# Patient Record
Sex: Female | Born: 1945
Health system: Southern US, Community
[De-identification: ages and names within clinical notes are randomized; demographics above are authoritative.]

## PROBLEM LIST (undated history)

## (undated) ENCOUNTER — Ambulatory Visit (HOSPITAL_COMMUNITY): Admission: EM | Payer: Medicare Other

## (undated) DIAGNOSIS — I639 Cerebral infarction, unspecified: Secondary | ICD-10-CM

## (undated) DIAGNOSIS — R109 Unspecified abdominal pain: Secondary | ICD-10-CM

## (undated) DIAGNOSIS — K219 Gastro-esophageal reflux disease without esophagitis: Secondary | ICD-10-CM

## (undated) DIAGNOSIS — Z8673 Personal history of transient ischemic attack (TIA), and cerebral infarction without residual deficits: Secondary | ICD-10-CM

## (undated) DIAGNOSIS — I10 Essential (primary) hypertension: Secondary | ICD-10-CM

## (undated) DIAGNOSIS — K449 Diaphragmatic hernia without obstruction or gangrene: Secondary | ICD-10-CM

## (undated) DIAGNOSIS — I214 Non-ST elevation (NSTEMI) myocardial infarction: Secondary | ICD-10-CM

## (undated) DIAGNOSIS — F411 Generalized anxiety disorder: Secondary | ICD-10-CM

## (undated) DIAGNOSIS — B370 Candidal stomatitis: Secondary | ICD-10-CM

## (undated) HISTORY — PX: KNEE ARTHROSCOPY: SUR90

## (undated) HISTORY — PX: BREAST SURGERY: SHX581

## (undated) HISTORY — DX: Unspecified abdominal pain: R10.9

## (undated) HISTORY — DX: Generalized anxiety disorder: F41.1

## (undated) HISTORY — DX: Candidal stomatitis: B37.0

## (undated) HISTORY — DX: Essential (primary) hypertension: I10

## (undated) HISTORY — DX: Gastro-esophageal reflux disease without esophagitis: K21.9

## (undated) HISTORY — PX: ABDOMINAL HYSTERECTOMY: SHX81

---

## 2000-08-03 ENCOUNTER — Encounter: Payer: Self-pay | Admitting: Internal Medicine

## 2000-08-03 ENCOUNTER — Encounter: Admission: RE | Admit: 2000-08-03 | Discharge: 2000-08-03 | Payer: Self-pay | Admitting: Internal Medicine

## 2000-08-05 ENCOUNTER — Encounter: Admission: RE | Admit: 2000-08-05 | Discharge: 2000-08-05 | Payer: Self-pay | Admitting: Internal Medicine

## 2000-08-05 ENCOUNTER — Encounter: Payer: Self-pay | Admitting: Internal Medicine

## 2000-08-29 ENCOUNTER — Emergency Department (HOSPITAL_COMMUNITY): Admission: EM | Admit: 2000-08-29 | Discharge: 2000-08-29 | Payer: Self-pay | Admitting: Emergency Medicine

## 2001-01-28 ENCOUNTER — Encounter: Payer: Self-pay | Admitting: Internal Medicine

## 2001-01-28 ENCOUNTER — Encounter: Admission: RE | Admit: 2001-01-28 | Discharge: 2001-01-28 | Payer: Self-pay | Admitting: Internal Medicine

## 2001-08-09 ENCOUNTER — Encounter: Payer: Self-pay | Admitting: Internal Medicine

## 2001-08-09 ENCOUNTER — Encounter: Admission: RE | Admit: 2001-08-09 | Discharge: 2001-08-09 | Payer: Self-pay | Admitting: Internal Medicine

## 2002-08-22 ENCOUNTER — Encounter: Payer: Self-pay | Admitting: Internal Medicine

## 2002-08-22 ENCOUNTER — Encounter: Admission: RE | Admit: 2002-08-22 | Discharge: 2002-08-22 | Payer: Self-pay | Admitting: Internal Medicine

## 2002-12-04 ENCOUNTER — Emergency Department (HOSPITAL_COMMUNITY): Admission: EM | Admit: 2002-12-04 | Discharge: 2002-12-04 | Payer: Self-pay | Admitting: Emergency Medicine

## 2002-12-04 ENCOUNTER — Encounter: Payer: Self-pay | Admitting: Emergency Medicine

## 2003-08-27 ENCOUNTER — Encounter: Payer: Self-pay | Admitting: General Surgery

## 2003-08-27 ENCOUNTER — Encounter: Admission: RE | Admit: 2003-08-27 | Discharge: 2003-08-27 | Payer: Self-pay | Admitting: General Surgery

## 2004-06-08 ENCOUNTER — Encounter: Admission: RE | Admit: 2004-06-08 | Discharge: 2004-06-08 | Payer: Self-pay | Admitting: Internal Medicine

## 2004-09-12 ENCOUNTER — Encounter: Admission: RE | Admit: 2004-09-12 | Discharge: 2004-09-12 | Payer: Self-pay | Admitting: General Surgery

## 2004-10-03 ENCOUNTER — Encounter: Admission: RE | Admit: 2004-10-03 | Discharge: 2004-10-03 | Payer: Self-pay | Admitting: General Surgery

## 2004-12-09 ENCOUNTER — Ambulatory Visit: Payer: Self-pay | Admitting: Internal Medicine

## 2005-02-04 ENCOUNTER — Ambulatory Visit: Payer: Self-pay | Admitting: Internal Medicine

## 2005-02-13 ENCOUNTER — Ambulatory Visit: Payer: Self-pay

## 2005-04-02 ENCOUNTER — Encounter: Admission: RE | Admit: 2005-04-02 | Discharge: 2005-04-02 | Payer: Self-pay | Admitting: General Surgery

## 2005-04-07 ENCOUNTER — Ambulatory Visit: Payer: Self-pay | Admitting: Internal Medicine

## 2005-05-12 ENCOUNTER — Ambulatory Visit: Payer: Self-pay | Admitting: Internal Medicine

## 2005-05-28 ENCOUNTER — Ambulatory Visit: Payer: Self-pay | Admitting: Internal Medicine

## 2005-07-01 ENCOUNTER — Encounter: Admission: RE | Admit: 2005-07-01 | Discharge: 2005-07-01 | Payer: Self-pay | Admitting: Internal Medicine

## 2005-07-07 ENCOUNTER — Ambulatory Visit: Payer: Self-pay | Admitting: Internal Medicine

## 2005-07-10 ENCOUNTER — Emergency Department (HOSPITAL_COMMUNITY): Admission: EM | Admit: 2005-07-10 | Discharge: 2005-07-10 | Payer: Self-pay | Admitting: Emergency Medicine

## 2005-07-16 ENCOUNTER — Ambulatory Visit: Payer: Self-pay | Admitting: Internal Medicine

## 2005-07-23 ENCOUNTER — Encounter: Admission: RE | Admit: 2005-07-23 | Discharge: 2005-10-21 | Payer: Self-pay | Admitting: Internal Medicine

## 2005-09-17 ENCOUNTER — Encounter: Admission: RE | Admit: 2005-09-17 | Discharge: 2005-09-17 | Payer: Self-pay | Admitting: General Surgery

## 2005-11-19 ENCOUNTER — Ambulatory Visit: Payer: Self-pay | Admitting: Internal Medicine

## 2006-10-26 ENCOUNTER — Encounter: Admission: RE | Admit: 2006-10-26 | Discharge: 2006-10-26 | Payer: Self-pay | Admitting: General Surgery

## 2006-11-09 ENCOUNTER — Ambulatory Visit: Payer: Self-pay | Admitting: Internal Medicine

## 2007-01-21 ENCOUNTER — Ambulatory Visit: Payer: Self-pay | Admitting: Internal Medicine

## 2007-01-21 LAB — CONVERTED CEMR LAB
ALT: 20 units/L (ref 0–40)
AST: 22 units/L (ref 0–37)
Albumin: 3.8 g/dL (ref 3.5–5.2)
Alkaline Phosphatase: 94 units/L (ref 39–117)
BUN: 10 mg/dL (ref 6–23)
Basophils Absolute: 0 10*3/uL (ref 0.0–0.1)
Basophils Relative: 0.7 % (ref 0.0–1.0)
Bilirubin, Direct: 0.1 mg/dL (ref 0.0–0.3)
CO2: 34 meq/L — ABNORMAL HIGH (ref 19–32)
Calcium: 9.6 mg/dL (ref 8.4–10.5)
Chloride: 100 meq/L (ref 96–112)
Cholesterol: 261 mg/dL (ref 0–200)
Creatinine, Ser: 0.9 mg/dL (ref 0.4–1.2)
Direct LDL: 176.2 mg/dL
Eosinophils Absolute: 0.2 10*3/uL (ref 0.0–0.6)
Eosinophils Relative: 2.6 % (ref 0.0–5.0)
GFR calc Af Amer: 82 mL/min
GFR calc non Af Amer: 68 mL/min
Glucose, Bld: 100 mg/dL — ABNORMAL HIGH (ref 70–99)
HCT: 38.4 % (ref 36.0–46.0)
HDL: 42.9 mg/dL (ref >39.0)
Hemoglobin: 12.8 g/dL (ref 12.0–15.0)
Lymphocytes Relative: 30.4 % (ref 12.0–46.0)
MCHC: 33.4 g/dL (ref 30.0–36.0)
MCV: 82.4 fL (ref 78.0–100.0)
Monocytes Absolute: 0.4 10*3/uL (ref 0.2–0.7)
Monocytes Relative: 5.9 % (ref 3.0–11.0)
Neutro Abs: 4.3 10*3/uL (ref 1.4–7.7)
Neutrophils Relative %: 60.4 % (ref 43.0–77.0)
Platelets: 249 10*3/uL (ref 150–400)
Potassium: 3.4 meq/L — ABNORMAL LOW (ref 3.5–5.1)
RBC: 4.66 M/uL (ref 3.87–5.11)
RDW: 12.3 % (ref 11.5–14.6)
Sodium: 143 meq/L (ref 135–145)
TSH: 3.66 microintl units/mL (ref 0.35–5.50)
Total Bilirubin: 0.9 mg/dL (ref 0.3–1.2)
Total CHOL/HDL Ratio: 6.1
Total Protein: 6.6 g/dL (ref 6.0–8.3)
Triglycerides: 206 mg/dL (ref 0–149)
VLDL: 41 mg/dL — ABNORMAL HIGH (ref 0–40)
WBC: 7 10*3/uL (ref 4.5–10.5)

## 2007-07-25 DIAGNOSIS — I10 Essential (primary) hypertension: Secondary | ICD-10-CM | POA: Insufficient documentation

## 2007-07-25 DIAGNOSIS — K219 Gastro-esophageal reflux disease without esophagitis: Secondary | ICD-10-CM | POA: Insufficient documentation

## 2007-07-25 HISTORY — DX: Gastro-esophageal reflux disease without esophagitis: K21.9

## 2007-07-25 HISTORY — DX: Essential (primary) hypertension: I10

## 2007-09-08 ENCOUNTER — Telehealth: Payer: Self-pay | Admitting: Internal Medicine

## 2007-09-29 ENCOUNTER — Encounter: Payer: Self-pay | Admitting: Internal Medicine

## 2007-09-29 ENCOUNTER — Telehealth: Payer: Self-pay | Admitting: Internal Medicine

## 2007-10-07 ENCOUNTER — Ambulatory Visit: Payer: Self-pay | Admitting: Internal Medicine

## 2007-10-07 DIAGNOSIS — F411 Generalized anxiety disorder: Secondary | ICD-10-CM

## 2007-10-07 HISTORY — DX: Generalized anxiety disorder: F41.1

## 2007-11-11 ENCOUNTER — Telehealth: Payer: Self-pay | Admitting: Internal Medicine

## 2008-02-10 ENCOUNTER — Telehealth: Payer: Self-pay | Admitting: Internal Medicine

## 2008-03-27 ENCOUNTER — Telehealth: Payer: Self-pay | Admitting: Internal Medicine

## 2008-03-27 ENCOUNTER — Emergency Department (HOSPITAL_COMMUNITY): Admission: EM | Admit: 2008-03-27 | Discharge: 2008-03-27 | Payer: Self-pay | Admitting: Emergency Medicine

## 2008-03-28 ENCOUNTER — Telehealth: Payer: Self-pay | Admitting: Internal Medicine

## 2008-03-29 ENCOUNTER — Ambulatory Visit: Payer: Self-pay | Admitting: Internal Medicine

## 2008-03-29 DIAGNOSIS — R109 Unspecified abdominal pain: Secondary | ICD-10-CM | POA: Insufficient documentation

## 2008-03-29 HISTORY — DX: Unspecified abdominal pain: R10.9

## 2008-10-03 ENCOUNTER — Telehealth: Payer: Self-pay | Admitting: Internal Medicine

## 2008-10-30 ENCOUNTER — Telehealth: Payer: Self-pay | Admitting: Internal Medicine

## 2008-12-05 ENCOUNTER — Encounter: Payer: Self-pay | Admitting: Internal Medicine

## 2008-12-07 ENCOUNTER — Telehealth: Payer: Self-pay | Admitting: Internal Medicine

## 2008-12-20 ENCOUNTER — Telehealth: Payer: Self-pay | Admitting: Internal Medicine

## 2009-02-22 ENCOUNTER — Telehealth: Payer: Self-pay | Admitting: Internal Medicine

## 2009-02-28 ENCOUNTER — Telehealth: Payer: Self-pay | Admitting: Internal Medicine

## 2009-05-24 ENCOUNTER — Ambulatory Visit: Payer: Self-pay | Admitting: Internal Medicine

## 2009-05-24 LAB — CONVERTED CEMR LAB
ALT: 10 units/L (ref 0–35)
AST: 16 units/L (ref 0–37)
Albumin: 3.8 g/dL (ref 3.5–5.2)
Alkaline Phosphatase: 68 units/L (ref 39–117)
BUN: 14 mg/dL (ref 6–23)
Basophils Absolute: 0 10*3/uL (ref 0.0–0.1)
Basophils Relative: 0.5 % (ref 0.0–3.0)
Bilirubin, Direct: 0 mg/dL (ref 0.0–0.3)
CO2: 33 meq/L — ABNORMAL HIGH (ref 19–32)
Calcium: 9 mg/dL (ref 8.4–10.5)
Chloride: 101 meq/L (ref 96–112)
Creatinine, Ser: 0.9 mg/dL (ref 0.4–1.2)
Eosinophils Absolute: 0.2 10*3/uL (ref 0.0–0.7)
Eosinophils Relative: 2.9 % (ref 0.0–5.0)
GFR calc non Af Amer: 67.48 mL/min (ref >60)
Glucose, Bld: 91 mg/dL (ref 70–99)
HCT: 34.4 % — ABNORMAL LOW (ref 36.0–46.0)
Hemoglobin: 11.7 g/dL — ABNORMAL LOW (ref 12.0–15.0)
Lymphocytes Relative: 30.7 % (ref 12.0–46.0)
Lymphs Abs: 1.8 10*3/uL (ref 0.7–4.0)
MCHC: 33.9 g/dL (ref 30.0–36.0)
MCV: 83.8 fL (ref 78.0–100.0)
Monocytes Absolute: 0.4 10*3/uL (ref 0.1–1.0)
Monocytes Relative: 6.4 % (ref 3.0–12.0)
Neutro Abs: 3.5 10*3/uL (ref 1.4–7.7)
Neutrophils Relative %: 59.5 % (ref 43.0–77.0)
Platelets: 181 10*3/uL (ref 150.0–400.0)
Potassium: 3.6 meq/L (ref 3.5–5.1)
RBC: 4.1 M/uL (ref 3.87–5.11)
RDW: 12.3 % (ref 11.5–14.6)
Sodium: 142 meq/L (ref 135–145)
TSH: 3.2 microintl units/mL (ref 0.35–5.50)
Total Bilirubin: 0.9 mg/dL (ref 0.3–1.2)
Total Protein: 6.6 g/dL (ref 6.0–8.3)
WBC: 5.9 10*3/uL (ref 4.5–10.5)

## 2009-05-27 ENCOUNTER — Telehealth: Payer: Self-pay | Admitting: Internal Medicine

## 2009-06-10 ENCOUNTER — Telehealth: Payer: Self-pay | Admitting: Internal Medicine

## 2010-03-17 ENCOUNTER — Ambulatory Visit: Payer: Self-pay | Admitting: Internal Medicine

## 2010-03-17 DIAGNOSIS — R002 Palpitations: Secondary | ICD-10-CM | POA: Insufficient documentation

## 2010-05-30 ENCOUNTER — Ambulatory Visit: Payer: Self-pay | Admitting: Family Medicine

## 2010-05-30 DIAGNOSIS — B370 Candidal stomatitis: Secondary | ICD-10-CM

## 2010-05-30 DIAGNOSIS — J019 Acute sinusitis, unspecified: Secondary | ICD-10-CM | POA: Insufficient documentation

## 2010-05-30 DIAGNOSIS — K130 Diseases of lips: Secondary | ICD-10-CM | POA: Insufficient documentation

## 2010-05-30 HISTORY — DX: Candidal stomatitis: B37.0

## 2010-06-05 ENCOUNTER — Encounter: Admission: RE | Admit: 2010-06-05 | Discharge: 2010-06-05 | Payer: Self-pay | Admitting: General Surgery

## 2010-06-26 ENCOUNTER — Telehealth: Payer: Self-pay | Admitting: Internal Medicine

## 2010-07-17 ENCOUNTER — Telehealth: Payer: Self-pay | Admitting: Internal Medicine

## 2010-08-11 ENCOUNTER — Telehealth: Payer: Self-pay | Admitting: Internal Medicine

## 2010-08-28 ENCOUNTER — Telehealth: Payer: Self-pay | Admitting: Internal Medicine

## 2010-08-29 ENCOUNTER — Ambulatory Visit: Payer: Self-pay | Admitting: Internal Medicine

## 2010-08-29 DIAGNOSIS — J069 Acute upper respiratory infection, unspecified: Secondary | ICD-10-CM | POA: Insufficient documentation

## 2010-09-03 ENCOUNTER — Telehealth: Payer: Self-pay | Admitting: Internal Medicine

## 2010-10-26 ENCOUNTER — Emergency Department (HOSPITAL_COMMUNITY): Admission: EM | Admit: 2010-10-26 | Discharge: 2010-10-26 | Payer: Self-pay | Admitting: Emergency Medicine

## 2010-10-27 ENCOUNTER — Telehealth: Payer: Self-pay | Admitting: Internal Medicine

## 2010-12-20 ENCOUNTER — Encounter: Payer: Self-pay | Admitting: General Surgery

## 2011-01-01 NOTE — Progress Notes (Signed)
  Phone Note Call from Patient   Summary of Call: Pt has a cough (non productive) x 2 weeks.  No fever, or illness...Marland KitchenMarland KitchenMarland Kitchenjust cough.  Worse at night. [facility])  Cannot come to the office. Pt is taking Robitussin but is not helping Initial call taken by: Lynann Beaver CMA,  July 17, 2010 11:29 AM  Follow-up for Phone Call        generic Tessalon pearls 200 mg, number 30, 1 t.i.d. Follow-up by: Gordy Savers  MD,  July 17, 2010 12:40 PM    New/Updated Medications: TESSALON PERLES 100 MG CAPS (BENZONATATE) one by mouth three times a day as needed cough Prescriptions: TESSALON PERLES 100 MG CAPS (BENZONATATE) one by mouth three times a day as needed cough  #30 x 0   Entered by:   Lynann Beaver CMA   Authorized by:   Gordy Savers  MD   Signed by:   Lynann Beaver CMA on 07/17/2010   Method used:   Electronically to        [facility] [phone]* (retail)       [address]       [place], [place]  [address]       Ph: [phone] or [phone]       Fax: [phone]   RxID:   [id number]

## 2011-01-01 NOTE — Progress Notes (Signed)
Summary: neck & upper back pain & stiffness  Phone Note Call from Patient Call back at [phone]   Call For: kwia Summary of Call: For awhile getting stiff neck off & on both sides, to point has to use hand to pick her head up.  Now in back towards neck.  No fever.  Request pain med for neck & upper back pain.  [facility] [phone].  NKDA. Initial call taken by: Rudy Jew, RN,  February 10, 2008 11:35 AM  Follow-up for Phone Call        tramadol 50  #50 one every 6 hours for pain Follow-up by: Gordy Savers  MD,  February 10, 2008 12:07 PM  Additional Follow-up for Phone Call Additional follow up Details #1::        Patient aware & med called in. Additional Follow-up by: Rudy Jew, RN,  February 10, 2008 1:46 PM    New/Updated Medications: TRAMADOL HCL 50 MG  TABS (TRAMADOL HCL) one every 6 hours for pain   Prescriptions: TRAMADOL HCL 50 MG  TABS (TRAMADOL HCL) one every 6 hours for pain  #50 x 0   Entered by:   Rudy Jew, RN   Authorized by:   Gordy Savers  MD   Signed by:   Rudy Jew, RN on 02/10/2008   Method used:   Telephoned to ...         RxID:   [id number]

## 2011-01-01 NOTE — Progress Notes (Signed)
Summary: refill  Phone Note Call from Patient Call back at Home Phone [phone]   Caller: pt live Call For: K  Summary of Call: hydrochloriothiazide 25 mg [facility] [place]  Initial call taken by: Roselle Locus,  October 30, 2008 4:06 PM  Follow-up for Phone Call       Follow-up by: Raechel Ache, RN,  October 30, 2008 4:13 PM

## 2011-01-01 NOTE — Progress Notes (Signed)
Summary: NO CALL NO SHOW 3 wk rov  Phone Note Outgoing Call   Call placed by: Duard Brady LPN,  June 26, 2010 4:32 PM Call placed to: Patient Summary of Call: NO CALL NO SHOW 3 wk rov  - attempt to call - ans mach - LMTCB to r/s   KIK Initial call taken by: Duard Brady LPN,  June 26, 2010 4:32 PM

## 2011-01-01 NOTE — Progress Notes (Signed)
Summary: Lab results  Phone Note Call from Patient   Summary of Call: Patient requesting lab results. Patient can be reached at [phone]. Initial call taken by: Darra Lis RMA,  May 27, 2009 1:43 PM  Follow-up for Phone Call        all OK Follow-up by: Gordy Savers  MD,  May 27, 2009 4:50 PM

## 2011-01-01 NOTE — Progress Notes (Signed)
Summary: REFILL  Phone Note Call from Patient Call back at Home Phone [phone]   Caller: PT LIVE Call For: K Summary of Call: CLONAZEPAM 1 MG [facility]  Initial call taken by: Roselle Locus,  December 07, 2008 2:52 PM      Prescriptions: KLONOPIN 1 MG  TABS (CLONAZEPAM) one twice daily  #60 x 4   Entered by:   Raechel Ache, RN   Authorized by:   Gordy Savers  MD   Signed by:   Raechel Ache, RN on 12/07/2008   Method used:   Historical   RxID:   [id number]

## 2011-01-01 NOTE — Miscellaneous (Signed)
Summary: Rx Lorazepam  Clinical Lists Changes  Medications: Added new medication of LORAZEPAM 1 MG TABS (LORAZEPAM) 1 three times a day as needed - Signed Rx of LORAZEPAM 1 MG TABS (LORAZEPAM) 1 three times a day as needed;  #90 x 0;  Signed;  Entered by: Raechel Ache, RN;  Authorized by: Gordy Savers  MD;  Method used: Historical    Prescriptions: LORAZEPAM 1 MG TABS (LORAZEPAM) 1 three times a day as needed  #90 x 0   Entered by:   Raechel Ache, RN   Authorized by:   Gordy Savers  MD   Signed by:   Raechel Ache, RN on 12/05/2008   Method used:   Historical   RxID:   [id number]

## 2011-01-01 NOTE — Progress Notes (Signed)
Summary: URI  Phone Note Call from Patient   Caller: Patient Call For: Erica Savers  MD Summary of Call: Pt calls complaining of URI, non productive cough, no fever and would like RX. [phone] [facility]) Initial call taken by: Lynann Beaver CMA,  February 22, 2009 3:43 PM  Follow-up for Phone Call        generic Hydromet, 6 ounces, 1 teaspoon every 6 hours as needed for cough or congestion Follow-up by: Erica Savers  MD,  February 22, 2009 5:04 PM

## 2011-01-01 NOTE — Progress Notes (Signed)
Summary: refill  Phone Note Call from Patient Call back at Home Phone [phone]   Caller: pt live Call For: K  Summary of Call: prozac 20 mg [facility] [place] [facility]  Initial call taken by: Roselle Locus,  October 03, 2008 4:40 PM  Follow-up for Phone Call        Rx Called In Follow-up by: Raechel Ache, RN,  October 04, 2008 8:35 AM

## 2011-01-01 NOTE — Progress Notes (Signed)
Summary: chest pain  Phone Note Call from Patient   Caller: Patient Call For: Dr. Kirtland Bouchard Summary of Call: Pt called with severe chest pain and was sent to [facility] ER by EMS. Initial call taken by: Lynann Beaver CMA,  March 27, 2008 1:45 PM  Follow-up for Phone Call        noted Follow-up by: Gordy Savers  MD,  March 27, 2008 5:04 PM

## 2011-01-01 NOTE — Progress Notes (Signed)
Summary: chest congestion, short winded  Phone Note Call from Patient Call back at Home Phone [phone] Call back at [phone] daughter's number   Caller: vm 4:31 Reason for Call: Talk to Nurse Summary of Call: There with bad cough.  Codeine not helping me.  Still have bad cough.  Med not helping me. No fever.  Congestion in head & chest.  Non productive.  Gets short winded with a little housework.  Head & side hurt from the coughing.  Using  sample 2 puffs two times a day,  [place].  [phone], daughter French Ana, may be with her.  [facility] [place].  NKDA except codeine & it is not making her itch this time.  Will go to UC or ER tonight as needed for the short breath, but wants to wait to hear from Dr. Kirtland Bouchard.  Rudy Jew, RN  September 03, 2010 5:10 PM  Initial call taken by: Rudy Jew, RN,  September 03, 2010 4:53 PM  Follow-up for Phone Call        She says she didn't cough last night and thinks that the codeine may be working it out.  She will call back as needed for ov.   Follow-up by: Rudy Jew, RN,  September 05, 2010 8:05 AM    rov if unimproved

## 2011-01-01 NOTE — Progress Notes (Signed)
Summary: REQUESTING RESULTS  Phone Note Call from Patient Call back at Home Phone [phone]   Caller: Patient- LIVE CALL Reason for Call: Talk to Nurse, Lab or Test Results Summary of Call: WANTS LAB RESULTS. PLEASE RETURN CALL. Initial call taken by: Warnell Forester,  May 27, 2009 10:30 AM    all OK  Appended Document: REQUESTING RESULTS called.

## 2011-01-01 NOTE — Progress Notes (Signed)
Summary: refill klonopin  Phone Note Refill Request Call back at Home Phone [phone]   Refills Requested: Medication #1:  KLONOPIN 1 MG  TABS one twice daily and may take 1 extra tab daily as needed for excessive anxiety [facility]   Initial call taken by: Heron Sabins,  August 11, 2010 12:18 PM  Follow-up for Phone Call        #75  RF 1 Follow-up by: Gordy Savers  MD,  August 11, 2010 2:45 PM    Prescriptions: Scarlette Calico 1 MG  TABS (CLONAZEPAM) one twice daily and may take 1 extra tab daily as needed for excessive anxiety  #75 x 2   Entered by:   Duard Brady LPN   Authorized by:   Gordy Savers  MD   Signed by:   Duard Brady LPN on 09/81/1914   Method used:   Historical   RxID:   [id number]  [facility]   KIK

## 2011-01-01 NOTE — Progress Notes (Signed)
----   Converted from flag ---- ---- 09/29/2007 1:35 PM, Raechel Ache, RN wrote:   ---- 09/29/2007 1:16 PM, Warnell Forester wrote: Samuel Bouche YOU AND DR K TO BE AWARE THAT PT IS AGGRAVATED ALL THE TIME AND SHE HOLLERS AT DAUGHTER AND HUSBAND, SHE MIGHT EVEN BE BIPOLAR, DAUGHTER STATED. SHE MAY NEED MEDS CHANGED. PLEASE NOTE PER DAUGHTER. ------------------------------

## 2011-01-01 NOTE — Assessment & Plan Note (Signed)
Summary: COUGH // RS   Vital Signs:  Patient profile:   65 year old female Weight:      177 pounds Temp:     98.2 degrees F oral BP sitting:   122 / 74  (left arm) Cuff size:   regular  Vitals Entered By: Duard Brady LPN (August 29, 2010 4:29 PM) CC: c/o cough, chest congestion  Is Patient Diabetic? No   CC:  c/o cough and chest congestion .  History of Present Illness: 65 year old patient, who presents with a 4 to 5-day history of chest congestion and cough.  Cough is her predominant septum, which has been interfering with sleep.  Cough is largely nonproductive.  Denies any chest pain, shortness of breath, fever or chills.  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  She has treated hypertension, which has been stable  Allergies: 1)  Codeine Phosphate (Codeine Phosphate) 2)  Darvon  Past History:  Past Medical History: Reviewed history from 10/07/2007 and no changes required. Menopausal Syndrome GERD Hypertension Anxiety  Review of Systems       The patient complains of prolonged cough.  The patient denies anorexia, fever, weight loss, weight gain, vision loss, decreased hearing, hoarseness, chest pain, syncope, dyspnea on exertion, peripheral edema, headaches, hemoptysis, abdominal pain, melena, hematochezia, severe indigestion/heartburn, hematuria, incontinence, genital sores, muscle weakness, suspicious skin lesions, transient blindness, difficulty walking, depression, unusual weight change, abnormal bleeding, enlarged lymph nodes, angioedema, and breast masses.    Physical Exam  General:  Well-developed,well-nourished,in no acute distress; alert,appropriate and cooperative throughout examination Head:  Normocephalic and atraumatic without obvious abnormalities. No apparent alopecia or balding. Eyes:  No corneal or conjunctival inflammation noted. EOMI. Perrla. Funduscopic exam benign, without  hemorrhages, exudates or papilledema. Vision grossly normal. Ears:  External ear exam shows no significant lesions or deformities.  Otoscopic examination reveals clear canals, tympanic membranes are intact bilaterally without bulging, retraction, inflammation or discharge. Hearing is grossly normal bilaterally. Mouth:  Oral mucosa and oropharynx without lesions or exudates.  Teeth in good repair. Neck:  No deformities, masses, or tenderness noted. Lungs:  Normal respiratory effort, chest expands symmetrically. Lungs are clear to auscultation, no crackles or wheezes. O2 saturation 97 Heart:  Normal rate and regular rhythm. S1 and S2 normal without gallop, murmur, click, rub or other extra sounds.   Impression & Recommendations:  Problem # 1:  URI (ICD-465.9)  Her updated medication list for this problem includes:    Aspir-low 81 Mg Tbec (Aspirin) ..... Qd    Tessalon 200 Mg Caps (Benzonatate) .Marland Kitchen... 1 by mouth three times a day as needed cough    Hydrocodone-homatropine 5-1.5 Mg/35ml Syrp (Hydrocodone-homatropine) .Marland Kitchen... 1 teaspoon every 6 hours as needed for cough  Problem # 2:  HYPERTENSION (ICD-401.9)  Her updated medication list for this problem includes:    Hydrochlorothiazide 25 Mg Tabs (Hydrochlorothiazide) .Marland Kitchen... 1 once daily  Complete Medication List: 1)  Hydrochlorothiazide 25 Mg Tabs (Hydrochlorothiazide) .Marland Kitchen.. 1 once daily 2)  Prilosec 20 Mg Cpdr (Omeprazole) .... Take 1 capsule by mouth once a day 3)  Klonopin 1 Mg Tabs (Clonazepam) .... One twice daily and may take 1 extra tab daily as needed for excessive anxiety 4)  Fluoxetine Hcl 40 Mg Caps (Fluoxetine hcl) .... One daily 5)  Aspir-low 81 Mg Tbec (Aspirin) .... Qd 6)  First-dukes Mouthwash Susp (Diphenhyd-hydrocort-nystatin) .Marland Kitchen.. 1 tsp three times a day with meals and at bedtime, swish  and spit as needed lesions 7)  Tessalon 200 Mg Caps (Benzonatate) .Marland Kitchen.. 1 by mouth three times a day as needed cough 8)   Hydrocodone-homatropine 5-1.5 Mg/60ml Syrp (Hydrocodone-homatropine) .Marland Kitchen.. 1 teaspoon every 6 hours as needed for cough  Patient Instructions: 1)  Get plenty of rest, drink lots of clear liquids, and use Tylenol or Ibuprofen for fever and comfort. Return in 7-10 days if you're not better:sooner if you're feeling worse. Prescriptions: HYDROCODONE-HOMATROPINE 5-1.5 MG/5ML SYRP (HYDROCODONE-HOMATROPINE) 1 teaspoon every 6 hours as needed for cough  #6 oz x 0   Entered and Authorized by:   Gordy Savers  MD   Signed by:   Gordy Savers  MD on 08/29/2010   Method used:   Print then Give to Patient   RxID:   [id number]

## 2011-01-01 NOTE — Assessment & Plan Note (Signed)
Summary: PALPITATIONS/WORKIN PER DR K/PS   Vital Signs:  Patient profile:   65 year old female Weight:      185 pounds O2 Sat:      98 % on Room air Temp:     98.1 degrees F oral Pulse rate:   60 / minute Pulse rhythm:   regular Resp:     20 per minute BP sitting:   108 / 70  (left arm) Cuff size:   regular  Vitals Entered By: Duard Brady LPN (March 17, 2010 4:03 PM)  O2 Flow:  Room air CC: c/o palpatations x 2 weeks - nausea and cough Is Patient Diabetic? No   CC:  c/o palpatations x 2 weeks - nausea and cough.  History of Present Illness: a 65 year old patient who presents with a two week history of frequent palpitations.  She states that several times throughout the day and especially the night.  She has the abrupt onset of a rapid regular heart rate.  She states that she is able to terminate these palpitations with movement of her upper body side to side.  She is a nonsmoker and modest caffeine user.  She does have a history of gastroesophageal reflux disease treated hypertension on diuretic therapy and anxiety.  She has not checked her pulse rate, but feels it is quite regular  Preventive Screening-Counseling & Management  Alcohol-Tobacco     Smoking Status: never  Allergies: 1)  Codeine Phosphate (Codeine Phosphate) 2)  Darvon (Propoxyphene Hcl)  Review of Systems  The patient denies anorexia, fever, weight loss, weight gain, vision loss, decreased hearing, hoarseness, chest pain, syncope, dyspnea on exertion, peripheral edema, prolonged cough, headaches, hemoptysis, abdominal pain, melena, hematochezia, severe indigestion/heartburn, hematuria, incontinence, genital sores, muscle weakness, suspicious skin lesions, transient blindness, difficulty walking, depression, unusual weight change, abnormal bleeding, enlarged lymph nodes, angioedema, and breast masses.    Physical Exam  General:  Well-developed,well-nourished,in no acute distress; alert,appropriate and  cooperative throughout examination Head:  Normocephalic and atraumatic without obvious abnormalities. No apparent alopecia or balding. Eyes:  No corneal or conjunctival inflammation noted. EOMI. Perrla. Funduscopic exam benign, without hemorrhages, exudates or papilledema. Vision grossly normal. Mouth:  Oral mucosa and oropharynx without lesions or exudates.  Teeth in good repair. Neck:  No deformities, masses, or tenderness noted. Lungs:  Normal respiratory effort, chest expands symmetrically. Lungs are clear to auscultation, no crackles or wheezes. Heart:  pulse slow irregular without murmurs, gallops, or clicks Abdomen:  Bowel sounds positive,abdomen soft and non-tender without masses, organomegaly or hernias noted.   Impression & Recommendations:  Problem # 1:  PALPITATIONS, OCCASIONAL (ICD-785.1)  Orders: EKG w/ Interpretation (93000) EKG revealed a sinus bradycardia, a short PR interval and nonspecific ST changes.  Patient has no health insurance, and wishes to not proceed with further investigation unless mandatory.  She will discontinue all caffeine use.  She is also counseled to check her pulse rate when this occurs.  Since his rhythm occurs with such regularity.  She will make contact with the office tomorrow  Problem # 2:  HYPERTENSION (ICD-401.9)  Her updated medication list for this problem includes:    Hydrochlorothiazide 25 Mg Tabs (Hydrochlorothiazide) .Marland Kitchen... 1 once daily  Orders: EKG w/ Interpretation (93000)  Problem # 3:  GERD (ICD-530.81)  Her updated medication list for this problem includes:    Prilosec 20 Mg Cpdr (Omeprazole) .Marland Kitchen... Take 1 capsule by mouth once a day  Complete Medication List: 1)  Hydrochlorothiazide 25 Mg Tabs (Hydrochlorothiazide) .Marland KitchenMarland KitchenMarland Kitchen  1 once daily 2)  Prilosec 20 Mg Cpdr (Omeprazole) .... Take 1 capsule by mouth once a day 3)  Klonopin 1 Mg Tabs (Clonazepam) .... One twice daily 4)  Fluoxetine Hcl 40 Mg Caps (Fluoxetine hcl) .... One  daily 5)  Aspir-low 81 Mg Tbec (Aspirin) .... Qd  Patient Instructions: 1)  avoid caffeine products and chocolate 2)  Avoid foods high in acid (tomatoes, citrus juices, spicy foods). Avoid eating within two hours of lying down or before exercising. Do not over eat; try smaller more frequent meals. Elevate head of bed twelve inches when sleeping. 3)  check pulse rate as discussed 4)  call if symptoms persist Prescriptions: FLUOXETINE HCL 40 MG CAPS (FLUOXETINE HCL) one daily  #90 x 6   Entered and Authorized by:   Gordy Savers  MD   Signed by:   Gordy Savers  MD on 03/17/2010   Method used:   Print then Give to Patient   RxID:   [id number] KLONOPIN 1 MG  TABS (CLONAZEPAM) one twice daily  #120 x 4   Entered and Authorized by:   Gordy Savers  MD   Signed by:   Gordy Savers  MD on 03/17/2010   Method used:   Print then Give to Patient   RxID:   [id number] PRILOSEC 20 MG CPDR (OMEPRAZOLE) Take 1 capsule by mouth once a day  #90 x 5   Entered and Authorized by:   Gordy Savers  MD   Signed by:   Gordy Savers  MD on 03/17/2010   Method used:   Print then Give to Patient   RxID:   [id number] HYDROCHLOROTHIAZIDE 25 MG TABS (HYDROCHLOROTHIAZIDE) 1 once daily  #90 Tablet x 4   Entered and Authorized by:   Gordy Savers  MD   Signed by:   Gordy Savers  MD on 03/17/2010   Method used:   Print then Give to Patient   RxID:   [id number]

## 2011-01-01 NOTE — Assessment & Plan Note (Signed)
Summary: FOLLOW UP FROM ER CHEST PAIN/MHF   Vital Signs:  Patient Profile:   65 Years Old Female Weight:      190 pounds Temp:     98.4 degrees F oral BP sitting:   128 / 78  (left arm) Cuff size:   regular  Vitals Entered By: Raechel Ache, RN (March 29, 2008 2:36 PM)                 Chief Complaint:  F/u ER. Still having back and abd pain. Darvocet not helping. Feels weak.Marland Kitchen  History of Present Illness: 65 year old female seen today for follow-upshe was evaluated in an emergent department two days ago for mid and epigastric pain.  Evaluation included comprehension of laboratory studies, as well as CT scan of the abdomen.  She tends to have some epigastric discomfort.  She has been on chronic Prilosec for gastroesophageal reflux disease.  Denies any weight loss or change in her bowel habits.  Her last colonoscopy was in 2001. no nausea, vomiting, or change in her weight    Current Allergies: CODEINE PHOSPHATE (CODEINE PHOSPHATE) DARVON (PROPOXYPHENE HCL)  Past Medical History:    Reviewed history from 10/07/2007 and no changes required:       Menopausal Syndrome       GERD       Hypertension       Anxiety     Review of Systems       The patient complains of abdominal pain.  The patient denies anorexia, fever, weight loss, chest pain, melena, hematochezia, and severe indigestion/heartburn.     Physical Exam  General:     Well-developed,well-nourished,in no acute distress; alert,appropriate and cooperative throughout examination Head:     Normocephalic and atraumatic without obvious abnormalities. No apparent alopecia or balding. Eyes:     No corneal or conjunctival inflammation noted. EOMI. Perrla. Funduscopic exam benign, without hemorrhages, exudates or papilledema. Vision grossly normal. Mouth:     Oral mucosa and oropharynx without lesions or exudates.  Teeth in good repair. Neck:     No deformities, masses, or tenderness noted. Lungs:     Normal  respiratory effort, chest expands symmetrically. Lungs are clear to auscultation, no crackles or wheezes. Heart:     Normal rate and regular rhythm. S1 and S2 normal without gallop, murmur, click, rub or other extra sounds. Abdomen:     no guarding or rebound.  There is some mild subjective tenderness in the mid abdominal region and epigastric area.  Bowel sounds are active.  No masses appreciated    Impression & Recommendations:  Problem # 1:  ABDOMINAL PAIN, RECURRENT (ICD-789.00)  will continue her present regimen and anxiolytic medications; she states that the lorazepam is no longer effective.  Will switch to Klonopin at a b.i.d. dose  Problem # 2:  ANXIETY (ICD-300.00)  The following medications were removed from the medication list:    Lorazepam 1 Mg Tabs (Lorazepam) .Marland Kitchen... Take 1 tablet by mouth three times a day  Her updated medication list for this problem includes:    Fluoxetine Hcl 20 Mg Caps (Fluoxetine hcl) .Marland Kitchen... 1 once daily    Klonopin 1 Mg Tabs (Clonazepam) ..... One twice daily   Problem # 3:  HYPERTENSION (ICD-401.9)  Her updated medication list for this problem includes:    Hydrochlorothiazide 25 Mg Tabs (Hydrochlorothiazide) .Marland Kitchen... 1 once daily   Problem # 4:  GERD (ICD-530.81)  Her updated medication list for this problem includes:  Prilosec 20 Mg Cpdr (Omeprazole) .Marland Kitchen... Take 1 capsule by mouth once a day   Complete Medication List: 1)  Fluoxetine Hcl 20 Mg Caps (Fluoxetine hcl) .Marland Kitchen.. 1 once daily 2)  Hydrochlorothiazide 25 Mg Tabs (Hydrochlorothiazide) .Marland Kitchen.. 1 once daily 3)  Prilosec 20 Mg Cpdr (Omeprazole) .... Take 1 capsule by mouth once a day 4)  Tramadol Hcl 50 Mg Tabs (Tramadol hcl) .... One every 6 hours for pain 5)  Klonopin 1 Mg Tabs (Clonazepam) .... One twice daily   Patient Instructions: 1)  Avoid foods high in acid (tomatoes, citrus juices, spicy foods). Avoid eating within two hours of lying down or before exercising. Do not over eat; try  smaller more frequent meals. Elevate head of bed twelve inches when sleeping. 2)  Please schedule a follow-up appointment in 3 months or sooner if unimproved    Prescriptions: KLONOPIN 1 MG  TABS (CLONAZEPAM) one twice daily  #90 x 4   Entered and Authorized by:   Gordy Savers  MD   Signed by:   Gordy Savers  MD on 03/29/2008   Method used:   Print then Give to Patient   RxID:   [id number] TRAMADOL HCL 50 MG  TABS (TRAMADOL HCL) one every 6 hours for pain  #50 x 4   Entered and Authorized by:   Gordy Savers  MD   Signed by:   Gordy Savers  MD on 03/29/2008   Method used:   Print then Give to Patient   RxID:   [id number]  ]

## 2011-01-01 NOTE — Assessment & Plan Note (Signed)
Summary: fu on meds/njr/PT RESCD/CCM   Vital Signs:  Patient Profile:   65 Years Old Female Weight:      194 pounds Temp:     98.3 degrees F oral BP sitting:   128 / 76  (left arm)  Vitals Entered By: Raechel Ache, RN (October 07, 2007 3:48 PM)                 Chief Complaint:  ROV and needs refills..  History of Present Illness: 65 year old patient seen today for follow-up of her hypertension, anxiety disorder  Current Allergies: CODEINE PHOSPHATE (CODEINE PHOSPHATE) DARVON (PROPOXYPHENE HCL)  Past Medical History:    Menopausal Syndrome    GERD    Hypertension    Anxiety  Past Surgical History:    Hysterectomy    Colonoscopy    gravida 3, para 3, abortus zero    breast biopsies    left knee arthroscopic surgery      Physical Exam  General:     Well-developed,well-nourished,in no acute distress; alert,appropriate and cooperative throughout examination  120/84 Head:     Normocephalic and atraumatic without obvious abnormalities. No apparent alopecia or balding. Eyes:     No corneal or conjunctival inflammation noted. EOMI. Perrla. Funduscopic exam benign, without hemorrhages, exudates or papilledema. Vision grossly normal. Mouth:     Oral mucosa and oropharynx without lesions or exudates.  Teeth in good repair. Neck:     No deformities, masses, or tenderness noted. Chest Wall:     No deformities, masses, or tenderness noted. Lungs:     Normal respiratory effort, chest expands symmetrically. Lungs are clear to auscultation, no crackles or wheezes. Heart:     Normal rate and regular rhythm. S1 and S2 normal without gallop, murmur, click, rub or other extra sounds. Abdomen:     Bowel sounds positive,abdomen soft and non-tender without masses, organomegaly or hernias noted.    Impression & Recommendations:  Problem # 1:  HYPERTENSION (ICD-401.9)  Her updated medication list for this problem includes:    Hydrochlorothiazide 25 Mg Tabs  (Hydrochlorothiazide) .Marland Kitchen... 1 once daily   Problem # 2:  GERD (ICD-530.81)  Her updated medication list for this problem includes:    Prilosec 20 Mg Cpdr (Omeprazole) .Marland Kitchen... Take 1 capsule by mouth once a day  lot [id number], EXP 30 jun 09, sanofi pasteur left deltoid IM, 0.5 cc.   Complete Medication List: 1)  Fluoxetine Hcl 20 Mg Caps (Fluoxetine hcl) .Marland Kitchen.. 1 once daily 2)  Hydrochlorothiazide 25 Mg Tabs (Hydrochlorothiazide) .Marland Kitchen.. 1 once daily 3)  Lorazepam 1 Mg Tabs (Lorazepam) .... Take 1 tablet by mouth three times a day 4)  Prilosec 20 Mg Cpdr (Omeprazole) .... Take 1 capsule by mouth once a day  Other Orders: Influenza Vaccine NON MCR (62130) Pneumococcal Vaccine (86578) Admin 1st Vaccine (46962)   Patient Instructions: 1)  Please schedule a follow-up appointment in 6 months. 2)  Limit your Sodium (Salt) to less than 2 grams a day(slightly less than 1/2 a teaspoon) to prevent fluid retention, swelling, or worsening of symptoms. 3)  Schedule your mammogram.    Prescriptions: PRILOSEC 20 MG CPDR (OMEPRAZOLE) Take 1 capsule by mouth once a day  #90 x 5   Entered and Authorized by:   Gordy Savers  MD   Signed by:   Gordy Savers  MD on 10/07/2007   Method used:   Print then Give to Patient   RxID:   [id number] LORAZEPAM 1 MG  TABS (LORAZEPAM) Take 1 tablet by mouth three times a day  #90 x 5   Entered and Authorized by:   Gordy Savers  MD   Signed by:   Gordy Savers  MD on 10/07/2007   Method used:   Print then Give to Patient   RxID:   [id number] HYDROCHLOROTHIAZIDE 25 MG TABS (HYDROCHLOROTHIAZIDE) 1 once daily  #90 x 5   Entered and Authorized by:   Gordy Savers  MD   Signed by:   Gordy Savers  MD on 10/07/2007   Method used:   Print then Give to Patient   RxID:   [id number] FLUOXETINE HCL 20 MG CAPS (FLUOXETINE HCL) 1 once daily  #90 x 5   Entered and Authorized by:   Gordy Savers  MD   Signed by:    Gordy Savers  MD on 10/07/2007   Method used:   Print then Give to Patient   RxID:   [id number]  ]  Pneumovax Vaccine    Vaccine Type: Pneumovax    Site: right deltoid    Mfr: Merck    Dose: 0.5 ml    Route: IM    Given by: Raechel Ache, RN    Exp. Date: 03/2009    Lot #: [id number]    VIS given: 06/27/96 version given October 07, 2007.  Influenza Vaccine    Vaccine Type: Fluvax Non-MCR    Given by: Raechel Ache, RN  Flu Vaccine Consent Questions    Do you have a history of severe allergic reactions to this vaccine? no    Any prior history of allergic reactions to egg and/or gelatin? no    Do you have a sensitivity to the preservative Thimersol? no    Do you have a past history of Guillan-Barre Syndrome? no    Do you currently have an acute febrile illness? no    Have you ever had a severe reaction to latex? no    Vaccine information given and explained to patient? yes    Are you currently pregnant? no

## 2011-01-01 NOTE — Progress Notes (Signed)
Summary: no  Phone Note Call from Patient Call back at Home Phone [phone]   Caller: patient triage message Call For: k [facility]  had [facility] infection still itches on one side very badly  Initial call taken by: Roselle Locus,  November 11, 2007 4:38 PM  Follow-up for Phone Call        nystatin-triamcinolone cream  30 gm use BID         Appended Document: no [facility]) Pt. notified  Appended Document: no Patient says medicine not at drugstore.  Called in again & spoke to pharmacist.  Patient called back to tell her med called in.

## 2011-01-01 NOTE — Progress Notes (Signed)
Summary: sore throat  Phone Note Call from Patient   Caller: Patient Call For: Dr. Kirtland Bouchard Summary of Call: Pt has a sore throat........she will call tomorrow if she can find a ride to come to the office for an appt. [phone] Initial call taken by: Lynann Beaver CMA,  December 20, 2008 4:44 PM  Follow-up for Phone Call        noted Follow-up by: Gordy Savers  MD,  December 21, 2008 8:03 AM

## 2011-01-01 NOTE — Progress Notes (Signed)
Summary: Call A Nurse   Call-A-Nurse Triage Call Report Triage Record Num: [id number] Operator: Aundra Millet Patient Name: Erica Morris Call Date & Time: 10/26/2010 8:18:23PM Patient Phone: [phone] PCP: Gordy Savers Patient Gender: Female PCP Fax : [phone] Patient DOB: Jun 30, 1946 Practice Name: [facility] Reason for Call: Pt had OV "2-3 weeks" for cough and finished abx and cough medicines and sx's relieved. Since 10/19/2010 has had onset of dry cough again , and have freq episodes and makes head hurt. Has used Inhaler 2 puffs last at 2000 and not hepled with cough and feel shaky. Feels smothery with so much coughing. RN advised to go [facility] now and pt agreed. Protocol(s) Used: Cough - Adult Recommended Outcome per Protocol: See ED Immediately Reason for Outcome: Continuous cough causing difficulty breathing Care Advice:  ~ Another adult should drive.  ~ Do not give the patient anything to eat or drink. Call EMS 911 if loss of consciousness, struggling to breathe, experiences new confusion or extreme drowsiness, change in skin color, or has chest pain or discomfort lasting 5 minutes or more.  ~  ~ Use prescribed rescue medication (inhaler, nebulizer) as directed.  ~ IMMEDIATE ACTION Write down provider's name. List or place the following in a bag for transport with the patient: current prescription and/or nonprescription medications; alternative treatments, therapies and medications; and street drugs.  ~  ~ Place person in a position of comfort and loosen tight clothing. 10/26/2010 8:26:45PM Page 1 of 1 [id number]

## 2011-01-01 NOTE — Assessment & Plan Note (Signed)
Summary: ? oral thrush?/dm   Vital Signs:  Patient profile:   65 year old female Height:      64 inches (162.56 cm) Weight:      176 pounds (80 kg) BMI:     30.32 O2 Sat:      98 % on Room air Temp:     98.3 degrees F (36.83 degrees C) oral Pulse rate:   71 / minute BP sitting:   108 / 70  (left arm) Cuff size:   regular  Vitals Entered By: Josph Macho RMA (May 30, 2010 3:07 PM)  O2 Flow:  Room air CC: Possible oral thrush X2 weeks/ pt states Clonazepam isn't helping and would like something stronger/ CF Is Patient Diabetic? No   History of Present Illness: Patient in for evaluation of painful oral lesions. She had congestion and sore throat with facial pain and HA for the past couple of weeks and then over the past week she has developed worsening sores in the mouth. It is becoming difficult to eat and swallow due to hte irritation. No open sores just sweling and ridges on top of mouth and gums are very sore. No fevers but some possible chills. Is also noting some urinary frequency. Has a long history of mild constipation which has not worsened. No bloody or tarry stool. No anorexia/n/v/CP/palp/anorexia. Facial pain and pressure concentrated on right side of face. Patient  requesting an increase in or change in her Klonopin dose. She reports it used to help her anxiety but now it is inadequate. She has just found out her husband has Alzheimers and she is under a great deal of her stress with her mother being ill as well.  Current Medications (verified): 1)  Hydrochlorothiazide 25 Mg Tabs (Hydrochlorothiazide) .Marland Kitchen.. 1 Once Daily 2)  Prilosec 20 Mg Cpdr (Omeprazole) .... Take 1 Capsule By Mouth Once A Day 3)  Klonopin 1 Mg  Tabs (Clonazepam) .... One Twice Daily 4)  Fluoxetine Hcl 40 Mg Caps (Fluoxetine Hcl) .... One Daily 5)  Aspir-Low 81 Mg Tbec (Aspirin) .... Qd  Allergies (verified): 1)  Codeine Phosphate (Codeine Phosphate) 2)  Darvon  Past History:  Past medical history  reviewed for relevance to current acute and chronic problems. Social history (including risk factors) reviewed for relevance to current acute and chronic problems.  Past Medical History: Reviewed history from 10/07/2007 and no changes required. Menopausal Syndrome GERD Hypertension Anxiety  Social History: Reviewed history from 07/25/2007 and no changes required. Married Never Smoked Alcohol use-no  Review of Systems      See HPI  Physical Exam  General:  Well-developed,well-nourished,in no acute distress; alert,appropriate and cooperative throughout examination Head:  Normocephalic and atraumatic without obvious abnormalities Eyes:  No corneal or conjunctival inflammation noted. EOMI Ears:  R TM with air bubbles noted behind. L TM dull Nose:  External nasal examination shows no deformity or inflammation. Nasal mucosa are pink and moist without lesions or exudates. Mouth:  irritation and inflammation noted at roof of mouth. gums mildly edematous. No open lesions or sores. Some submandiublar lymphadenopathy is noted. Neck:  No deformities, masses, or tenderness noted. Lungs:  Normal respiratory effort, chest expands symmetrically. Lungs are clear to auscultation, no crackles or wheezes. Heart:  Normal rate and regular rhythm. S1 and S2 normal without gallop, murmur, click, rub or other extra sounds. Abdomen:  Bowel sounds positive,abdomen soft and non-tender without masses, organomegaly or hernias noted. Extremities:  No clubbing, cyanosis, edema, or deformity noted with  normal full range of motion of all joints.   Psych:  Cognition and judgment appear intact. Alert and cooperative with normal attention span and concentration. No apparent delusions, illusions, hallucinations   Impression & Recommendations:  Problem # 1:  CANDIDIASIS OF MOUTH (ICD-112.0) Magic mouthwash is prescribed qid swish and spit. Add a probiotic daily  Problem # 2:  SINUSITIS, ACUTE (ICD-461.9)  Her  updated medication list for this problem includes:    Azithromycin 250 Mg Tabs (Azithromycin) .Marland Kitchen... 2 by  mouth today and then 1 daily for 4 days Given this and encouraged increased hydration and rest over  next week.  Problem # 3:  ANXIETY (ICD-300.00)  Her updated medication list for this problem includes:    Klonopin 1 Mg Tabs (Clonazepam) ..... One twice daily and may take 1 extra tab daily as needed for excessive anxiety    Fluoxetine Hcl 40 Mg Caps (Fluoxetine hcl) ..... One daily Husband just diagnosed with alzheimers, allowed an extra Klonopin any given day and asked to take it sparingly. If the stress continues she is set up to discuss further options with her PMD next month  Problem # 4:  HYPERTENSION (ICD-401.9)  Her updated medication list for this problem includes:    Hydrochlorothiazide 25 Mg Tabs (Hydrochlorothiazide) .Marland Kitchen... 1 once daily Adequately controlled  Complete Medication List: 1)  Hydrochlorothiazide 25 Mg Tabs (Hydrochlorothiazide) .Marland Kitchen.. 1 once daily 2)  Prilosec 20 Mg Cpdr (Omeprazole) .... Take 1 capsule by mouth once a day 3)  Klonopin 1 Mg Tabs (Clonazepam) .... One twice daily and may take 1 extra tab daily as needed for excessive anxiety 4)  Fluoxetine Hcl 40 Mg Caps (Fluoxetine hcl) .... One daily 5)  Aspir-low 81 Mg Tbec (Aspirin) .... Qd 6)  First-dukes Mouthwash Susp (Diphenhyd-hydrocort-nystatin) .Marland Kitchen.. 1 tsp three times a day with meals and at bedtime, swish and spit as needed lesions 7)  Azithromycin 250 Mg Tabs (Azithromycin) .... 2 by  mouth today and then 1 daily for 4 days  Patient Instructions: 1)  Please schedule an appointment with your primary doctor in 3-4 weeks. 2)  May use one extra Klonopin daily as needed for increased anxiety and stress but try to take them sparingly. We will make arrangements for your to come back in and discuss your increasing stressors in the near future. .  Prescriptions: KLONOPIN 1 MG  TABS (CLONAZEPAM) one twice  daily and may take 1 extra tab daily as needed for excessive anxiety  #75 x 0   Entered and Authorized by:   Danise Edge MD   Signed by:   Danise Edge MD on 05/30/2010   Method used:   Print then Give to Patient   RxID:   [id number] AZITHROMYCIN 250 MG  TABS (AZITHROMYCIN) 2 by  mouth today and then 1 daily for 4 days  #6 x 0   Entered and Authorized by:   Danise Edge MD   Signed by:   Danise Edge MD on 05/30/2010   Method used:   [facility] [phone][facility]retail)       [address] [place]  [address]       Ph: [phone] or [phone]       Fax: [phone]   RxID:   [id number] FIRST-DUKES MOUTHWASH  SUSP (DIPHENHYD-HYDROCORT-NYSTATIN) 1 tsp three times a day with meals and at bedtime, swish and spit as needed  lesions  #240cc x 0   Entered and Authorized by:   Danise Edge MD   Signed by:   Danise Edge MD on 05/30/2010   Method used:   [facility] to        [facility] [phone]* (retail)       [address]       [place], [place]  [address]       Ph: [phone] or [phone]       Fax: [phone]   RxID:   [id number]

## 2011-01-01 NOTE — Progress Notes (Signed)
Summary: about rx from er dr  Phone Note Call from Patient Call back at [phone]   Caller: Patient live Call For: k Summary of Call: Patient got a rx for vicodin from the er dr, and the pharmacy would not fill it untill she call her family dr.  [phone]. Initial call taken by: Celine Ahr,  March 28, 2008 4:01 PM  Follow-up for Phone Call        Pt. states she was given Darvocette at the ER for PUD, but is not helping.  Advised her to see Dr. Kirtland Bouchard tomorrow, but she has to call back to see if she can get a ride. Follow-up by: Lynann Beaver CMA,  March 28, 2008 4:13 PM    OK to RF if no significant allergy to codeine, eg nausea;  do not RF if h/o signif allergy to codeine     Appended Document: about rx from er dr Change per Dr. Kirtland Bouchard to Tramadol 50 mg. one q 6 hours. #30 plus 3 refills.  Appended Document: about rx from er dr Pt has appt today...Marland KitchenMarland KitchenWill just wait.

## 2011-01-01 NOTE — Progress Notes (Signed)
Summary: med  Phone Note Call from Patient Call back at Home Phone [phone]   Caller: Patient Call For: Dr Kirtland Bouchard Summary of Call: pt has congestion in her nostril would like for something to be called in to the drug store [facility] Initial call taken by: Shan Levans,  September 08, 2007 1:58 PM  Follow-up for Phone Call        allegra d  12 hour  #20 one twice daily Follow-up by: Gordy Savers  MD,  September 08, 2007 4:57 PM  Additional Follow-up for Phone Call Additional follow up Details #1::        Med called in & patient advised - left message. Additional Follow-up by: Rudy Jew, RN,  September 08, 2007 5:12 PM

## 2011-01-01 NOTE — Miscellaneous (Signed)
  Clinical Lists Changes  Medications: Changed medication from FLUOXETINE HCL 20 MG CAPS (FLUOXETINE HCL) to FLUOXETINE HCL 20 MG CAPS (FLUOXETINE HCL) 1 once daily Changed medication from HYDROCHLOROTHIAZIDE 25 MG TABS (HYDROCHLOROTHIAZIDE) to HYDROCHLOROTHIAZIDE 25 MG TABS (HYDROCHLOROTHIAZIDE) 1 once daily Removed medication of PROZAC 20 MG  CAPS (FLUOXETINE HCL)

## 2011-01-01 NOTE — Progress Notes (Signed)
Summary: refill  Phone Note Refill Request Call back at Home Phone [phone] Message from:  Patient  Refills Requested: Medication #1:  HYDROCHLOROTHIAZIDE 25 MG TABS 1 once daily [facility] she is out of meds   Initial call taken by: Roselle Locus,  June 10, 2009 3:31 PM    Prescriptions: HYDROCHLOROTHIAZIDE 25 MG TABS (HYDROCHLOROTHIAZIDE) 1 once daily  #90 Tablet x 4   Entered by:   Raechel Ache, RN   Authorized by:   Gordy Savers  MD   Signed by:   Raechel Ache, RN on 06/10/2009   Method used:   [facility]        [facility] [phone]* (retail)       [address]       [place], [place]  [address]       Ph: [phone] or [phone]       Fax: [phone]   RxID:   [id number]

## 2011-01-01 NOTE — Progress Notes (Signed)
Summary: non productive cough  Phone Note Call from Patient   Caller: Patient Call For: Gordy Savers  MD Summary of Call: [phone] [facility]) Pt is still having a non productive cough and weakness.  Cannot come to the office until her insurance is reinstated.  Would like a RX that will "break up" her cough. The cough med (Hydromet) helps her rest, but she coughs alll day.  Initial call taken by: Lynann Beaver CMA,  February 28, 2009 2:41 PM  Follow-up for Phone Call        continue Hydromet  but add  Mucinex DM Follow-up by: Gordy Savers  MD,  February 28, 2009 3:00 PM      Appended Document: non productive cough Pt notified.

## 2011-01-01 NOTE — Assessment & Plan Note (Signed)
Summary: TROUBLE SLEEPING AT NIGHT//CCM rsc with pt/mhf   Vital Signs:  Patient profile:   65 year old female Weight:      182 pounds Temp:     98 degrees F oral BP sitting:   110 / 76  (left arm) Cuff size:   regular  Vitals Entered By: Raechel Ache, RN (May 24, 2009 12:42 PM)  CC:  C/o nausea, can't sleep, and panic attacks.Marland Kitchen  History of Present Illness: 66 year old patient with a long history of hypertension.  She has a long history of an anxiety disorder;  for the past couple months, she has had increasing panic attacks.  She has a history of reflux that has been stable.  She has been on fluoxetine and Klonopin.  She states multiple famine members have similar panic attacks  Allergies: 1)  Codeine Phosphate (Codeine Phosphate) 2)  Darvon (Propoxyphene Hcl)  Past History:  Past Medical History: Reviewed history from 10/07/2007 and no changes required. Menopausal Syndrome GERD Hypertension Anxiety  Review of Systems  The patient denies anorexia, fever, weight loss, weight gain, vision loss, decreased hearing, hoarseness, chest pain, syncope, dyspnea on exertion, peripheral edema, prolonged cough, headaches, hemoptysis, abdominal pain, melena, hematochezia, severe indigestion/heartburn, hematuria, incontinence, genital sores, muscle weakness, suspicious skin lesions, transient blindness, difficulty walking, depression, unusual weight change, abnormal bleeding, enlarged lymph nodes, angioedema, and breast masses.    Physical Exam  General:  overweight-appearing.  normal blood pressureoverweight-appearing.   Head:  Normocephalic and atraumatic without obvious abnormalities. No apparent alopecia or balding. Eyes:  No corneal or conjunctival inflammation noted. EOMI. Perrla. Funduscopic exam benign, without hemorrhages, exudates or papilledema. Vision grossly normal. Ears:  External ear exam shows no significant lesions or deformities.  Otoscopic examination reveals clear  canals, tympanic membranes are intact bilaterally without bulging, retraction, inflammation or discharge. Hearing is grossly normal bilaterally. Mouth:  Oral mucosa and oropharynx without lesions or exudates.  Teeth in good repair. Neck:  No deformities, masses, or tenderness noted. Lungs:  Normal respiratory effort, chest expands symmetrically. Lungs are clear to auscultation, no crackles or wheezes. Heart:  Normal rate and regular rhythm. S1 and S2 normal without gallop, murmur, click, rub or other extra sounds. Abdomen:  Bowel sounds positive,abdomen soft and non-tender without masses, organomegaly or hernias noted. Msk:  No deformity or scoliosis noted of thoracic or lumbar spine.   Pulses:  R and L carotid,radial,femoral,dorsalis pedis and posterior tibial pulses are full and equal bilaterally Extremities:  No clubbing, cyanosis, edema, or deformity noted with normal full range of motion of all joints.     Impression & Recommendations:  Problem # 1:  ANXIETY (ICD-300.00)  The following medications were removed from the medication list:    Fluoxetine Hcl 20 Mg Caps (Fluoxetine hcl) .Marland Kitchen... 1 once daily Her updated medication list for this problem includes:    Klonopin 1 Mg Tabs (Clonazepam) ..... One twice daily    Lorazepam 1 Mg Tabs (Lorazepam) .Marland Kitchen... 1 three times a day as needed    Fluoxetine Hcl 40 Mg Caps (Fluoxetine hcl) ..... One daily    The following medications were removed from the medication list:    Fluoxetine Hcl 20 Mg Caps (Fluoxetine hcl) .Marland Kitchen... 1 once daily Her updated medication list for this problem includes:    Klonopin 1 Mg Tabs (Clonazepam) ..... One twice daily    Lorazepam 1 Mg Tabs (Lorazepam) .Marland Kitchen... 1 three times a day as needed    Fluoxetine Hcl 40 Mg Caps (Fluoxetine hcl) .Marland KitchenMarland KitchenMarland KitchenMarland Kitchen  One daily  Problem # 2:  HYPERTENSION (ICD-401.9)  Her updated medication list for this problem includes:    Hydrochlorothiazide 25 Mg Tabs (Hydrochlorothiazide) .Marland Kitchen... 1 once  daily    Her updated medication list for this problem includes:    Hydrochlorothiazide 25 Mg Tabs (Hydrochlorothiazide) .Marland Kitchen... 1 once daily  Complete Medication List: 1)  Hydrochlorothiazide 25 Mg Tabs (Hydrochlorothiazide) .Marland Kitchen.. 1 once daily 2)  Prilosec 20 Mg Cpdr (Omeprazole) .... Take 1 capsule by mouth once a day 3)  Tramadol Hcl 50 Mg Tabs (Tramadol hcl) .... One every 6 hours for pain 4)  Klonopin 1 Mg Tabs (Clonazepam) .... One twice daily 5)  Lorazepam 1 Mg Tabs (Lorazepam) .Marland Kitchen.. 1 three times a day as needed 6)  Hydromet 5-1.5 Mg/46ml Syrp (Hydrocodone-homatropine) .... As directed 7)  Fluoxetine Hcl 40 Mg Caps (Fluoxetine hcl) .... One daily  Other Orders: Venipuncture (21308) TLB-BMP (Basic Metabolic Panel-BMET) (80048-METABOL) TLB-CBC Platelet - w/Differential (85025-CBCD) TLB-Hepatic/Liver Function Pnl (80076-HEPATIC) TLB-TSH (Thyroid Stimulating Hormone) (65784-ONG)  Patient Instructions: 1)  Please schedule a follow-up appointment in 4 months. 2)  Limit your Sodium (Salt). 3)  It is important that you exercise regularly at least 20 minutes 5 times a week. If you develop chest pain, have severe difficulty breathing, or feel very tired , stop exercising immediately and seek medical attention. 4)  You need to lose weight. Consider a lower calorie diet and regular exercise.  Prescriptions: FLUOXETINE HCL 40 MG CAPS (FLUOXETINE HCL) one daily  #90 x 5   Entered and Authorized by:   Gordy Savers  MD   Signed by:   Gordy Savers  MD on 05/24/2009   Method used:   Print then Give to Patient   RxID:   [id number] KLONOPIN 1 MG  TABS (CLONAZEPAM) one twice daily  #120 x 4   Entered and Authorized by:   Gordy Savers  MD   Signed by:   Gordy Savers  MD on 05/24/2009   Method used:   Print then Give to Patient   RxID:   [id number] TRAMADOL HCL 50 MG  TABS (TRAMADOL HCL) one every 6 hours for pain  #50 x 4   Entered and Authorized by:    Gordy Savers  MD   Signed by:   Gordy Savers  MD on 05/24/2009   Method used:   Print then Give to Patient   RxID:   [id number] PRILOSEC 20 MG CPDR (OMEPRAZOLE) Take 1 capsule by mouth once a day  #90 x 5   Entered and Authorized by:   Gordy Savers  MD   Signed by:   Gordy Savers  MD on 05/24/2009   Method used:   Print then Give to Patient   RxID:   [id number] HYDROCHLOROTHIAZIDE 25 MG TABS (HYDROCHLOROTHIAZIDE) 1 once daily  #90 Tablet x 4   Entered and Authorized by:   Gordy Savers  MD   Signed by:   Gordy Savers  MD on 05/24/2009   Method used:   Print then Give to Patient   RxID:   [id number]

## 2011-01-01 NOTE — Progress Notes (Signed)
Summary: cough/congestion  Phone Note Call from Patient Call back at Home Phone [phone]   Caller: Patient Call For: Erica Savers  MD Summary of Call: Pt has a cough and congestion for two day. Pt unable to come in due to transportation. [facility]  Initial call taken by: Heron Sabins,  August 28, 2010 3:39 PM  Follow-up for Phone Call        generic tessalon perles 200 mg  #30 one three times a day as needed cough Follow-up by: Erica Savers  MD,  August 28, 2010 5:14 PM  Additional Follow-up for Phone Call Additional follow up Details #1::        change to med list done - [facility] - [facility]. KIk Additional Follow-up by: Duard Brady LPN,  August 28, 2010 5:29 PM    New/Updated Medications: TESSALON 200 MG CAPS (BENZONATATE) 1 by mouth three times a day as needed cough Prescriptions: TESSALON 200 MG CAPS (BENZONATATE) 1 by mouth three times a day as needed cough  #30 x 0   Entered by:   Duard Brady LPN   Authorized by:   Erica Savers  MD   Signed by:   Duard Brady LPN on 78/46/9629   Method used:   [facility]       [facility]  [address] [phone]       [phone]       [place], [place]  [address]       Ph: [phone] or [phone]       Fax: [phone]   RxID:   [id number]

## 2011-01-20 ENCOUNTER — Other Ambulatory Visit: Payer: Self-pay | Admitting: Internal Medicine

## 2011-01-20 DIAGNOSIS — F419 Anxiety disorder, unspecified: Secondary | ICD-10-CM

## 2011-01-22 ENCOUNTER — Other Ambulatory Visit: Payer: Self-pay | Admitting: Internal Medicine

## 2011-01-23 ENCOUNTER — Emergency Department (HOSPITAL_COMMUNITY)
Admission: EM | Admit: 2011-01-23 | Discharge: 2011-01-23 | Disposition: A | Discharge status: Home / Self Care | Payer: Self-pay | Attending: Emergency Medicine | Admitting: Emergency Medicine

## 2011-01-23 ENCOUNTER — Emergency Department (HOSPITAL_COMMUNITY): Payer: Self-pay

## 2011-01-23 DIAGNOSIS — I10 Essential (primary) hypertension: Secondary | ICD-10-CM | POA: Insufficient documentation

## 2011-01-23 DIAGNOSIS — M25559 Pain in unspecified hip: Secondary | ICD-10-CM | POA: Insufficient documentation

## 2011-01-23 DIAGNOSIS — M5430 Sciatica, unspecified side: Secondary | ICD-10-CM | POA: Insufficient documentation

## 2011-01-23 LAB — URINALYSIS, ROUTINE W REFLEX MICROSCOPIC
Bilirubin Urine: NEGATIVE
Hgb urine dipstick: NEGATIVE
Ketones, ur: NEGATIVE mg/dL
Nitrite: NEGATIVE
Protein, ur: NEGATIVE mg/dL
Specific Gravity, Urine: 1.025 (ref 1.005–1.030)
Urine Glucose, Fasting: NEGATIVE mg/dL
Urobilinogen, UA: 0.2 mg/dL (ref 0.0–1.0)
pH: 5.5 (ref 5.0–8.0)

## 2011-01-23 LAB — URINE MICROSCOPIC-ADD ON

## 2011-01-25 LAB — URINE CULTURE
Colony Count: 7000
Culture  Setup Time: 201202250140

## 2011-01-28 ENCOUNTER — Encounter: Payer: Self-pay | Admitting: Internal Medicine

## 2011-01-28 ENCOUNTER — Telehealth: Payer: Self-pay | Admitting: Internal Medicine

## 2011-01-28 NOTE — Telephone Encounter (Signed)
Triage vm-----went to ER last Friday and they did xray of her legs, but not her back. Was given codeine. It did not help. Please return call. It is hard to get up and down.

## 2011-01-28 NOTE — Telephone Encounter (Signed)
Spoke with pt - went to Er - still having back pain - pain meds not helping at all - appt made for tomorrow 245pm

## 2011-01-29 ENCOUNTER — Ambulatory Visit: Payer: Self-pay | Admitting: Internal Medicine

## 2011-03-13 ENCOUNTER — Telehealth: Payer: Self-pay | Admitting: *Deleted

## 2011-03-13 NOTE — Telephone Encounter (Signed)
Pt is having pain in the throat and left arm.  Advised to call 911 to rule out MI.

## 2011-03-16 NOTE — Telephone Encounter (Signed)
Sent to Dr. K

## 2011-03-25 ENCOUNTER — Other Ambulatory Visit: Payer: Self-pay | Admitting: Internal Medicine

## 2011-03-25 NOTE — Telephone Encounter (Signed)
Pt out of clonazePAM (KLONOPIN) 1 MG tablet. Pt req [facility] be [facility] [phone].  Pt req that this be called int today.

## 2011-03-26 MED ORDER — CLONAZEPAM 1 MG PO TABS: 1.0000 mg | ORAL_TABLET | Freq: Two times a day (BID) | ORAL | Status: DC | PRN

## 2011-03-26 NOTE — Telephone Encounter (Signed)
[facility] [address] [facility]

## 2011-04-02 ENCOUNTER — Other Ambulatory Visit: Payer: Self-pay | Admitting: Internal Medicine

## 2011-04-17 NOTE — Assessment & Plan Note (Signed)
[facility]                            [facility] OFFICE NOTE   LAROSE, BATRES                      MRN:          [id number]  DATE:01/21/2007                            DOB:          May 26, 1946    A 65 year old female seen today for an annual exam. She has a history of  hypertension, anxiety and depression, gastroesophageal reflux disease,  menopausal syndrome. She has had a prior hysterectomy. She is a gravida  2, para 3, abortus 0 with 7 grandchildren. She has a history of  fibrocystic breast disease status post right lumpectomy. Has had left  knee arthroscopic surgery.   REVIEW OF SYSTEMS:  Negative. She did have a stress Myoview that was  negative in 2006. Last colonoscopy was in 2001.   FAMILY HISTORY:  Father died at 14 of a stroke. Two brothers, one sister  positive for diabetes, renal cancer.   PHYSICAL EXAMINATION:  GENERAL:  Revealed a well-developed, moderately  overweight white female in no acute distress.  VITAL SIGNS:  Blood pressure was 110/74.  HEENT:  Fundi, ear, nose and throat clear. The patient was edentulous.  NECK:  Supple, no bruits. No adenopathy.  CHEST:  Clear.  BREASTS:  Negative. She did have a surgical scar noted on the right.  CARDIOVASCULAR:  Normal heart sounds, no murmur.  ABDOMEN:  Benign, no organomegaly.  PELVIC:  Absent uterus, no adnexal masses, stool heme negative.  EXTREMITIES:  Revealed intact peripheral pulses without edema.  NEUROLOGIC:  Negative.   IMPRESSION:  1. Hypertension.  2. Anxiety and depression.  3. Fibrocystic breast disease.  4. Gastroesophageal reflux disease.   DISPOSITION:  Her medical regimen will be unchanged. Prescriptions  dispensed. Laboratory update reviewed.     Gordy Savers, MD  Electronically Signed    PFK/MedQ  DD: 01/21/2007  DT: 01/21/2007  Job #: [id number]

## 2011-05-05 ENCOUNTER — Encounter: Payer: Self-pay | Admitting: Internal Medicine

## 2011-05-11 ENCOUNTER — Encounter: Payer: Self-pay | Admitting: Internal Medicine

## 2011-05-11 ENCOUNTER — Ambulatory Visit (INDEPENDENT_AMBULATORY_CARE_PROVIDER_SITE_OTHER): Payer: Self-pay | Admitting: Internal Medicine

## 2011-05-11 DIAGNOSIS — G4700 Insomnia, unspecified: Secondary | ICD-10-CM

## 2011-05-11 DIAGNOSIS — I10 Essential (primary) hypertension: Secondary | ICD-10-CM

## 2011-05-11 DIAGNOSIS — F411 Generalized anxiety disorder: Secondary | ICD-10-CM

## 2011-05-11 NOTE — Patient Instructions (Signed)
Take Klonopin at bedtime to help assist with sleep for short term  Take 400-600 mg of ibuprofen ( Advil, Motrin) with food every 4 to 6 hours as needed for pain relief or control of fever  MONISTAT  Call or return to clinic prn if these symptoms worsen or fail to improve as anticipated.

## 2011-05-11 NOTE — Progress Notes (Signed)
  Subjective:    Patient ID: Erica Morris, female    DOB: 16-Nov-1946, 65 y.o.   MRN: [id number]  HPI  65 year old patient who has a history of anxiety  And  depression. She has been anxious about the health of her mother and has been  sleeping quite poorly. Medical regimen includes Klonopin that she takes in the morning along with fluoxetine. She also complains of mild sore throat and neck discomfort. Denies any fever shortness of breath or sputum production. Denies any wheezing  Review of Systems  Constitutional: Positive for fatigue.  HENT: Negative for hearing loss, congestion, sore throat, rhinorrhea, dental problem, sinus pressure and tinnitus.   Eyes: Negative for pain, discharge and visual disturbance.  Respiratory: Negative for cough and shortness of breath.   Cardiovascular: Negative for chest pain, palpitations and leg swelling.  Gastrointestinal: Negative for nausea, vomiting, abdominal pain, diarrhea, constipation, blood in stool and abdominal distention.  Genitourinary: Negative for dysuria, urgency, frequency, hematuria, flank pain, vaginal bleeding, vaginal discharge, difficulty urinating, vaginal pain and pelvic pain.  Musculoskeletal: Negative for joint swelling, arthralgias and gait problem.  Skin: Negative for rash.  Neurological: Negative for dizziness, syncope, speech difficulty, weakness, numbness and headaches.  Hematological: Negative for adenopathy.  Psychiatric/Behavioral: Positive for sleep disturbance. Negative for behavioral problems, dysphoric mood and agitation. The patient is not nervous/anxious.        Objective:   Physical Exam  Constitutional: She is oriented to person, place, and time. She appears well-developed and well-nourished.  HENT:  Head: Normocephalic.  Right Ear: External ear normal.  Left Ear: External ear normal.  Mouth/Throat: Oropharynx is clear and moist.  Eyes: Conjunctivae and EOM are normal. Pupils are equal, round, and reactive to  light.  Neck: Normal range of motion. Neck supple. No thyromegaly present.  Cardiovascular: Normal rate, regular rhythm, normal heart sounds and intact distal pulses.   Pulmonary/Chest: Effort normal and breath sounds normal.  Abdominal: Soft. Bowel sounds are normal. She exhibits no mass. There is no tenderness.  Musculoskeletal: Normal range of motion.  Lymphadenopathy:    She has no cervical adenopathy.  Neurological: She is alert and oriented to person, place, and time.  Skin: Skin is warm and dry. No rash noted.  Psychiatric: She has a normal mood and affect. Her behavior is normal.          Assessment & Plan:   Insomnia. Aggravated by situational stress; suggest he take an additional Klonopin at bedtime when necessary short-term Mild sore throat. No evidence of pharyngitis; will treat symptomatically with ibuprofen Anxiety disorder  Hypertension well controlled on diuretic therapy

## 2011-08-03 ENCOUNTER — Other Ambulatory Visit: Payer: Self-pay | Admitting: Internal Medicine

## 2011-08-25 LAB — HEPATIC FUNCTION PANEL
ALT: 12
AST: 14
Albumin: 3.7
Alkaline Phosphatase: 73
Bilirubin, Direct: <0.1
Total Bilirubin: 0.4
Total Protein: 6.1

## 2011-08-25 LAB — POCT I-STAT, CHEM 8
BUN: 19
Calcium, Ion: 1.12
Chloride: 105
Creatinine, Ser: 1
Glucose, Bld: 97
HCT: 37
Hemoglobin: 12.6
Potassium: 3.3 — ABNORMAL LOW
Sodium: 139
TCO2: 29

## 2011-08-25 LAB — CBC
HCT: 35.8 — ABNORMAL LOW
Hemoglobin: 12
MCHC: 33.5
MCV: 83.1
Platelets: 214
RBC: 4.32
RDW: 13.4
WBC: 8

## 2011-08-25 LAB — DIFFERENTIAL
Basophils Absolute: 0
Basophils Relative: 0
Eosinophils Absolute: 0.2
Eosinophils Relative: 2
Lymphocytes Relative: 25
Lymphs Abs: 2
Monocytes Absolute: 0.5
Monocytes Relative: 6
Neutro Abs: 5.3
Neutrophils Relative %: 66

## 2011-08-25 LAB — POCT CARDIAC MARKERS
CKMB, poc: <1 — ABNORMAL LOW
CKMB, poc: <1 — ABNORMAL LOW
Myoglobin, poc: 25.2
Myoglobin, poc: 57.9
Operator id: 146091
Operator id: 146091
Troponin i, poc: 0.07 — ABNORMAL HIGH
Troponin i, poc: <0.05

## 2011-08-25 LAB — LIPASE, BLOOD: Lipase: 18

## 2011-10-05 ENCOUNTER — Other Ambulatory Visit: Payer: Self-pay

## 2012-04-01 ENCOUNTER — Other Ambulatory Visit: Payer: Self-pay | Admitting: Internal Medicine

## 2012-04-08 ENCOUNTER — Telehealth: Payer: Self-pay | Admitting: *Deleted

## 2012-04-08 NOTE — Telephone Encounter (Signed)
If Dr Kirtland Bouchard has denied I cannot refill.

## 2012-04-08 NOTE — Telephone Encounter (Signed)
Notified pt to call Dr Kirtland Bouchard back on [place] for refill.

## 2012-04-08 NOTE — Telephone Encounter (Addendum)
Pt was denied by Dr Kirtland Bouchard for her Clonazepam 1mg . 1 tab 2 x a day.  Really needs it due to a death in the family and a husband with dementia.

## 2012-04-11 ENCOUNTER — Other Ambulatory Visit: Payer: Self-pay | Admitting: Internal Medicine

## 2012-04-11 MED ORDER — CLONAZEPAM 1 MG PO TABS: 1.0000 mg | ORAL_TABLET | Freq: Two times a day (BID) | ORAL | Status: DC | PRN

## 2012-04-11 NOTE — Telephone Encounter (Signed)
Refill request on clonazePAM (KLONOPIN) 1 MG tablet   [facility] [address]

## 2012-04-28 ENCOUNTER — Ambulatory Visit (INDEPENDENT_AMBULATORY_CARE_PROVIDER_SITE_OTHER): Payer: Medicare Other | Admitting: Internal Medicine

## 2012-04-28 ENCOUNTER — Encounter: Payer: Self-pay | Admitting: Internal Medicine

## 2012-04-28 VITALS — BP 100/70 | Temp 98.0°F | Wt 177.0 lb

## 2012-04-28 DIAGNOSIS — F411 Generalized anxiety disorder: Secondary | ICD-10-CM

## 2012-04-28 DIAGNOSIS — K219 Gastro-esophageal reflux disease without esophagitis: Secondary | ICD-10-CM

## 2012-04-28 DIAGNOSIS — I10 Essential (primary) hypertension: Secondary | ICD-10-CM

## 2012-04-28 MED ORDER — CLONAZEPAM 1 MG PO TABS: 1.0000 mg | ORAL_TABLET | Freq: Two times a day (BID) | ORAL | Status: DC | PRN

## 2012-04-28 MED ORDER — HYDROCHLOROTHIAZIDE 25 MG PO TABS: 25.0000 mg | ORAL_TABLET | Freq: Every day | ORAL | Status: DC

## 2012-04-28 MED ORDER — OMEPRAZOLE 20 MG PO CPDR: 20.0000 mg | DELAYED_RELEASE_CAPSULE | Freq: Every day | ORAL | Status: DC

## 2012-04-28 MED ORDER — SERTRALINE HCL 100 MG PO TABS: 100.0000 mg | ORAL_TABLET | Freq: Every day | ORAL | Status: DC

## 2012-04-28 NOTE — Patient Instructions (Signed)
Discontinue fluoxetine    It is important that you exercise regularly, at least 20 minutes 3 to 4 times per week.  If you develop chest pain or shortness of breath seek  medical attention.  Limit your sodium (Salt) intake  Return office visit 6 weeks

## 2012-04-28 NOTE — Progress Notes (Signed)
  Subjective:    Patient ID: Erica Morris, female    DOB: 02/03/1946, 66 y.o.   MRN: [id number]  HPI  66 year old patient who is seen today for followup of hypertension. This has been well-controlled on diuretic therapy. She has a history of anxiety disorder and has been under considerable more situational stress she states that both her mother who is terminal and also her husband have dementia. Hospice is involved. She is sleeping poorly at night but has a tendency to want to sleep throughout the day. She feels depressed and overwhelmed. Medical regimen includes Prozac 20 mg daily. She also uses Klonopin at bedtime.    Review of Systems  Constitutional: Negative.   HENT: Negative for hearing loss, congestion, sore throat, rhinorrhea, dental problem, sinus pressure and tinnitus.   Eyes: Negative for pain, discharge and visual disturbance.  Respiratory: Negative for cough and shortness of breath.   Cardiovascular: Negative for chest pain, palpitations and leg swelling.  Gastrointestinal: Negative for nausea, vomiting, abdominal pain, diarrhea, constipation, blood in stool and abdominal distention.  Genitourinary: Negative for dysuria, urgency, frequency, hematuria, flank pain, vaginal bleeding, vaginal discharge, difficulty urinating, vaginal pain and pelvic pain.  Musculoskeletal: Negative for joint swelling, arthralgias and gait problem.  Skin: Negative for rash.  Neurological: Negative for dizziness, syncope, speech difficulty, weakness, numbness and headaches.  Hematological: Negative for adenopathy.  Psychiatric/Behavioral: Positive for sleep disturbance. Negative for behavioral problems, dysphoric mood and agitation. The patient is nervous/anxious.        Objective:   Physical Exam  Constitutional: She is oriented to person, place, and time. She appears well-developed and well-nourished.  HENT:  Head: Normocephalic.  Right Ear: External ear normal.  Left Ear: External ear normal.    Mouth/Throat: Oropharynx is clear and moist.  Eyes: Conjunctivae and EOM are normal. Pupils are equal, round, and reactive to light.  Neck: Normal range of motion. Neck supple. No thyromegaly present.  Cardiovascular: Normal rate, regular rhythm, normal heart sounds and intact distal pulses.   Pulmonary/Chest: Effort normal and breath sounds normal.  Abdominal: Soft. Bowel sounds are normal. She exhibits no mass. There is no tenderness.  Musculoskeletal: Normal range of motion.  Lymphadenopathy:    She has no cervical adenopathy.  Neurological: She is alert and oriented to person, place, and time.  Skin: Skin is warm and dry. No rash noted.  Psychiatric: She has a normal mood and affect. Her behavior is normal.          Assessment & Plan:   Situational stress and depression Hypertension well controlled Chronic anxiety disorder  We'll discontinue paroxetine and substitute sertraline. Medications refilled We'll recheck in 6 weeks

## 2012-06-10 ENCOUNTER — Other Ambulatory Visit: Payer: Self-pay | Admitting: Internal Medicine

## 2012-06-10 MED ORDER — CLONAZEPAM 1 MG PO TABS: 1.0000 mg | ORAL_TABLET | Freq: Two times a day (BID) | ORAL | Status: DC | PRN

## 2012-06-10 NOTE — Telephone Encounter (Signed)
Pt requesting refill of clonazepam 1 mg sent to [facility].  Pt states he contacted the pharmacy and was told to call our office instead, states she is out and needs it asap due to her husband's health condition.

## 2012-06-10 NOTE — Telephone Encounter (Signed)
OK 

## 2012-06-10 NOTE — Telephone Encounter (Signed)
Called in - ok # 60 or ok for early Rf if she has Rfs on hold

## 2012-07-29 ENCOUNTER — Encounter: Payer: Self-pay | Admitting: Internal Medicine

## 2012-08-02 ENCOUNTER — Ambulatory Visit: Payer: Medicare Other | Admitting: Internal Medicine

## 2012-08-09 ENCOUNTER — Other Ambulatory Visit: Payer: Self-pay | Admitting: Internal Medicine

## 2012-08-09 ENCOUNTER — Encounter: Payer: Self-pay | Admitting: Internal Medicine

## 2012-08-09 ENCOUNTER — Ambulatory Visit (INDEPENDENT_AMBULATORY_CARE_PROVIDER_SITE_OTHER): Payer: Medicare Other | Admitting: Internal Medicine

## 2012-08-09 VITALS — BP 110/74 | Temp 98.0°F | Wt 164.0 lb

## 2012-08-09 DIAGNOSIS — N6314 Unspecified lump in the right breast, lower inner quadrant: Secondary | ICD-10-CM

## 2012-08-09 DIAGNOSIS — F411 Generalized anxiety disorder: Secondary | ICD-10-CM

## 2012-08-09 DIAGNOSIS — I10 Essential (primary) hypertension: Secondary | ICD-10-CM

## 2012-08-09 DIAGNOSIS — N63 Unspecified lump in unspecified breast: Secondary | ICD-10-CM

## 2012-08-09 DIAGNOSIS — N6310 Unspecified lump in the right breast, unspecified quadrant: Secondary | ICD-10-CM

## 2012-08-09 NOTE — Progress Notes (Signed)
Subjective:    Patient ID: Erica Morris, female    DOB: 03-12-1946, 66 y.o.   MRN: [id number]  HPI  66 year old patient who has treated hypertension that has been well-controlled on diuretic therapy. She presents with a chief complaint of a right breast mass. She first noted this 2-3 weeks ago and it has been unchanged. Nontender. She has had a prior right breast excisional biopsy. She has a history of situational stress and did lose her mother to advanced Alzheimer's disease 2 months ago. Her husband presently is hospitalized and also has Alzheimer's disease. In general doing fairly well  Past Medical History  Diagnosis Date  . ABDOMINAL PAIN, RECURRENT 03/29/2008  . ANXIETY 10/07/2007  . Candidiasis of mouth 05/30/2010  . GERD 07/25/2007  . HYPERTENSION 07/25/2007    History   Social History  . Marital Status: Married    Spouse Name: N/A    Number of Children: N/A  . Years of Education: N/A   Occupational History  . Not on file.   Social History Main Topics  . Smoking status: Never Smoker   . Smokeless tobacco: Never Used  . Alcohol Use: No  . Drug Use: No  . Sexually Active: Not on file   Other Topics Concern  . Not on file   Social History Narrative  . No narrative on file    Past Surgical History  Procedure Date  . Abdominal hysterectomy   . Knee arthroscopy   . Breast surgery     bx    Family History  Problem Relation Age of Onset  . Kidney disease Neg Hx   . Stroke Neg Hx   . Diabetes Neg Hx     Allergies  Allergen Reactions  . Codeine Phosphate     REACTION: unspecified  . Propoxyphene Hcl     REACTION: unspecified    Current Outpatient Prescriptions on File Prior to Visit  Medication Sig Dispense Refill  . aspirin 81 MG tablet Take 81 mg by mouth daily.        . clonazePAM (KLONOPIN) 1 MG tablet Take 1 tablet (1 mg total) by mouth 2 (two) times daily as needed.  60 tablet  0  . hydrochlorothiazide (HYDRODIURIL) 25 MG tablet Take 1 tablet (25 mg  total) by mouth daily.  90 tablet  4  . omeprazole (PRILOSEC) 20 MG capsule Take 1 capsule (20 mg total) by mouth daily.  90 capsule  4  . sertraline (ZOLOFT) 100 MG tablet Take 1 tablet (100 mg total) by mouth daily.  30 tablet  3    BP 110/74  Temp 98 F (36.7 C) (Oral)  Wt 164 lb (74.39 kg)        Review of Systems  Constitutional: Negative.   HENT: Negative for hearing loss, congestion, sore throat, rhinorrhea, dental problem, sinus pressure and tinnitus.   Eyes: Negative for pain, discharge and visual disturbance.  Respiratory: Negative for cough and shortness of breath.   Cardiovascular: Negative for chest pain, palpitations and leg swelling.  Gastrointestinal: Negative for nausea, vomiting, abdominal pain, diarrhea, constipation, blood in stool and abdominal distention.  Genitourinary: Negative for dysuria, urgency, frequency, hematuria, flank pain, vaginal bleeding, vaginal discharge, difficulty urinating, vaginal pain and pelvic pain.  Musculoskeletal: Negative for joint swelling, arthralgias and gait problem.  Skin: Negative for rash.  Neurological: Negative for dizziness, syncope, speech difficulty, weakness, numbness and headaches.  Hematological: Negative for adenopathy.  Psychiatric/Behavioral: Negative for behavioral problems, dysphoric mood and agitation. The  patient is nervous/anxious.        Objective:   Physical Exam  Constitutional: She is oriented to person, place, and time. She appears well-developed and well-nourished. No distress.  HENT:  Head: Normocephalic.  Right Ear: External ear normal.  Left Ear: External ear normal.  Mouth/Throat: Oropharynx is clear and moist.  Eyes: Conjunctivae and EOM are normal. Pupils are equal, round, and reactive to light.  Neck: Normal range of motion. Neck supple. No thyromegaly present.  Cardiovascular: Normal rate, regular rhythm, normal heart sounds and intact distal pulses.   Pulmonary/Chest: Effort normal and  breath sounds normal.       10-15 mm firm superficial subcutaneous nodule noted involving the 5:00 position of the right breast Excisional biopsy scar noted involving the right lateral breast ; axilla clear   Abdominal: Soft. Bowel sounds are normal. She exhibits no mass. There is no tenderness.  Musculoskeletal: Normal range of motion.  Lymphadenopathy:    She has no cervical adenopathy.  Neurological: She is alert and oriented to person, place, and time.  Skin: Skin is warm and dry. No rash noted.  Psychiatric: She has a normal mood and affect. Her behavior is normal.          Assessment & Plan:  Right breast mass. We'll proceed with the diagnostic mammogram. May need ultrasound and surgical excision. Will refer back to general surgeon Hypertension stable Anxiety disorder stable

## 2012-08-09 NOTE — Patient Instructions (Addendum)
Diagnostic mammogram as discussed ([facility]) Follow  up general surgeon Dr Derrell Lolling)  Return in 3 months for follow-up

## 2012-08-09 NOTE — Addendum Note (Signed)
Addended by: Eleonore Chiquito F on: 08/09/2012 09:56 AM   Modules accepted: Orders

## 2012-08-10 ENCOUNTER — Ambulatory Visit
Admission: RE | Admit: 2012-08-10 | Discharge: 2012-08-10 | Disposition: A | Discharge status: Home / Self Care | Payer: Medicare Other | Source: Ambulatory Visit | Attending: Internal Medicine | Admitting: Internal Medicine

## 2012-08-10 DIAGNOSIS — N6314 Unspecified lump in the right breast, lower inner quadrant: Secondary | ICD-10-CM

## 2012-08-23 ENCOUNTER — Other Ambulatory Visit: Payer: Self-pay | Admitting: Internal Medicine

## 2012-08-23 NOTE — Telephone Encounter (Signed)
done

## 2012-08-23 NOTE — Telephone Encounter (Signed)
This was on past med list - timed out Please advise ok to refill

## 2012-08-23 NOTE — Telephone Encounter (Signed)
ok 

## 2012-11-19 ENCOUNTER — Emergency Department (HOSPITAL_COMMUNITY): Payer: Medicare Other

## 2012-11-19 ENCOUNTER — Emergency Department (HOSPITAL_COMMUNITY)
Admission: EM | Admit: 2012-11-19 | Discharge: 2012-11-20 | Disposition: A | Discharge status: Home Health | Payer: Medicare Other | Attending: Emergency Medicine | Admitting: Emergency Medicine

## 2012-11-19 DIAGNOSIS — K219 Gastro-esophageal reflux disease without esophagitis: Secondary | ICD-10-CM | POA: Insufficient documentation

## 2012-11-19 DIAGNOSIS — E876 Hypokalemia: Secondary | ICD-10-CM

## 2012-11-19 DIAGNOSIS — R0789 Other chest pain: Secondary | ICD-10-CM | POA: Insufficient documentation

## 2012-11-19 DIAGNOSIS — Z8619 Personal history of other infectious and parasitic diseases: Secondary | ICD-10-CM | POA: Insufficient documentation

## 2012-11-19 DIAGNOSIS — I10 Essential (primary) hypertension: Secondary | ICD-10-CM | POA: Insufficient documentation

## 2012-11-19 DIAGNOSIS — F411 Generalized anxiety disorder: Secondary | ICD-10-CM | POA: Insufficient documentation

## 2012-11-19 DIAGNOSIS — K649 Unspecified hemorrhoids: Secondary | ICD-10-CM | POA: Insufficient documentation

## 2012-11-19 DIAGNOSIS — Z79899 Other long term (current) drug therapy: Secondary | ICD-10-CM | POA: Insufficient documentation

## 2012-11-19 DIAGNOSIS — Z7982 Long term (current) use of aspirin: Secondary | ICD-10-CM | POA: Insufficient documentation

## 2012-11-19 LAB — URINALYSIS, ROUTINE W REFLEX MICROSCOPIC
Bilirubin Urine: NEGATIVE
Glucose, UA: NEGATIVE mg/dL
Hgb urine dipstick: NEGATIVE
Ketones, ur: NEGATIVE mg/dL
Nitrite: NEGATIVE
Protein, ur: NEGATIVE mg/dL
Specific Gravity, Urine: 1.005 (ref 1.005–1.030)
Urobilinogen, UA: 0.2 mg/dL (ref 0.0–1.0)
pH: 6.5 (ref 5.0–8.0)

## 2012-11-19 LAB — CBC WITH DIFFERENTIAL/PLATELET
Basophils Absolute: 0 10*3/uL (ref 0.0–0.1)
Basophils Relative: 0 % (ref 0–1)
Eosinophils Absolute: 0.2 10*3/uL (ref 0.0–0.7)
Eosinophils Relative: 2 % (ref 0–5)
HCT: 35.5 % — ABNORMAL LOW (ref 36.0–46.0)
Hemoglobin: 11.7 g/dL — ABNORMAL LOW (ref 12.0–15.0)
Lymphocytes Relative: 33 % (ref 12–46)
Lymphs Abs: 2.5 10*3/uL (ref 0.7–4.0)
MCH: 27.5 pg (ref 26.0–34.0)
MCHC: 33 g/dL (ref 30.0–36.0)
MCV: 83.3 fL (ref 78.0–100.0)
Monocytes Absolute: 0.4 10*3/uL (ref 0.1–1.0)
Monocytes Relative: 6 % (ref 3–12)
Neutro Abs: 4.3 10*3/uL (ref 1.7–7.7)
Neutrophils Relative %: 58 % (ref 43–77)
Platelets: 199 10*3/uL (ref 150–400)
RBC: 4.26 MIL/uL (ref 3.87–5.11)
RDW: 13.5 % (ref 11.5–15.5)
WBC: 7.3 10*3/uL (ref 4.0–10.5)

## 2012-11-19 LAB — URINE MICROSCOPIC-ADD ON

## 2012-11-19 MED ORDER — PANTOPRAZOLE SODIUM 40 MG PO TBEC
40.0000 mg | DELAYED_RELEASE_TABLET | Freq: Once | ORAL | Status: AC
  Administered 2012-11-20: 40 mg via ORAL
  Filled 2012-11-19: qty 1

## 2012-11-19 MED ORDER — GI COCKTAIL ~~LOC~~
30.0000 mL | Freq: Once | ORAL | Status: AC
  Administered 2012-11-20: 30 mL via ORAL
  Filled 2012-11-19: qty 30

## 2012-11-19 NOTE — ED Notes (Signed)
Pt admits to drinking some vodka tonight for christmas and that is when her heartburn started

## 2012-11-19 NOTE — ED Notes (Signed)
Patient transported to X-ray 

## 2012-11-19 NOTE — ED Notes (Signed)
Pt had 3 aspirin and 1 ntg pta to ED

## 2012-11-19 NOTE — ED Notes (Signed)
States she has had some problem with constipation lately as well.

## 2012-11-19 NOTE — ED Notes (Signed)
Pt c/o chest pain x 2-3 days states today it is going down her right arm

## 2012-11-20 LAB — COMPREHENSIVE METABOLIC PANEL
ALT: 7 U/L (ref 0–35)
AST: 9 U/L (ref 0–37)
Albumin: 3.6 g/dL (ref 3.5–5.2)
Alkaline Phosphatase: 68 U/L (ref 39–117)
BUN: 13 mg/dL (ref 6–23)
CO2: 29 mEq/L (ref 19–32)
Calcium: 9.7 mg/dL (ref 8.4–10.5)
Chloride: 102 mEq/L (ref 96–112)
Creatinine, Ser: 0.87 mg/dL (ref 0.50–1.10)
GFR calc Af Amer: 79 mL/min — ABNORMAL LOW (ref >90)
GFR calc non Af Amer: 68 mL/min — ABNORMAL LOW (ref >90)
Glucose, Bld: 100 mg/dL — ABNORMAL HIGH (ref 70–99)
Potassium: 3.1 mEq/L — ABNORMAL LOW (ref 3.5–5.1)
Sodium: 141 mEq/L (ref 135–145)
Total Bilirubin: 0.2 mg/dL — ABNORMAL LOW (ref 0.3–1.2)
Total Protein: 6.6 g/dL (ref 6.0–8.3)

## 2012-11-20 LAB — LIPASE, BLOOD: Lipase: 33 U/L (ref 11–59)

## 2012-11-20 LAB — TROPONIN I: Troponin I: <0.3 ng/mL (ref <0.30)

## 2012-11-20 MED ORDER — HYDROCORTISONE 2.5 % RE CREA: TOPICAL_CREAM | RECTAL | Status: DC

## 2012-11-20 MED ORDER — SUCRALFATE 1 G PO TABS: 1.0000 g | ORAL_TABLET | Freq: Four times a day (QID) | ORAL | Status: DC

## 2012-11-20 MED ORDER — PANTOPRAZOLE SODIUM 20 MG PO TBEC: 40.0000 mg | DELAYED_RELEASE_TABLET | Freq: Every day | ORAL | Status: DC

## 2012-11-20 MED ORDER — POTASSIUM CHLORIDE CRYS ER 20 MEQ PO TBCR
40.0000 meq | EXTENDED_RELEASE_TABLET | Freq: Once | ORAL | Status: AC
  Administered 2012-11-20: 40 meq via ORAL
  Filled 2012-11-20: qty 2

## 2012-11-20 NOTE — ED Provider Notes (Signed)
History     CSN: [id number]  Arrival date & time 11/19/12  2157   First MD Initiated Contact with Patient 11/19/12 2300      Chief Complaint  Patient presents with  . Chest Pain    (Consider location/radiation/quality/duration/timing/severity/associated sxs/prior treatment) HPI 66 year old female presents to emergency apartment with complaint of 2-3 days of reflux and burning pain in her mid chest. She also has been having pain in her right shoulder radiating down into her fingertips over the same period time. She called 911 tonight as symptoms were worse. She has known history of hiatal hernia. She is not currently prescribed anything for reflux. Tonight she had several drinks of vodka with her son which exacerbated her symptoms. She took Pepto and drank orange juice without improvement in symptoms. She took TUMS without improvement. She's also complaining of pain around her navel and her rectum itching. She denies any fever or chills. She has had some constipation recently. She has not taken any treatment for the constipation. Past Medical History  Diagnosis Date  . ABDOMINAL PAIN, RECURRENT 03/29/2008  . ANXIETY 10/07/2007  . Candidiasis of mouth 05/30/2010  . GERD 07/25/2007  . HYPERTENSION 07/25/2007    Past Surgical History  Procedure Date  . Abdominal hysterectomy   . Knee arthroscopy   . Breast surgery     bx    Family History  Problem Relation Age of Onset  . Kidney disease Neg Hx   . Stroke Neg Hx   . Diabetes Neg Hx     History  Substance Use Topics  . Smoking status: Never Smoker   . Smokeless tobacco: Never Used  . Alcohol Use: No    OB History    Grav Para Term Preterm Abortions TAB SAB Ect Mult Living                  Review of Systems  See History of Present Illness; otherwise all other systems are reviewed and negative  Allergies  Codeine phosphate  Home Medications   Current Outpatient Rx  Name  Route  Sig  Dispense  Refill  . ASPIRIN 81 MG  PO TABS   Oral   Take 81 mg by mouth daily.           Marland Kitchen CLONAZEPAM 1 MG PO TABS   Oral   Take 1 tablet (1 mg total) by mouth 2 (two) times daily as needed.   60 tablet   0   . FLUOXETINE HCL 20 MG PO CAPS      TAKE 1 CAPSULE BY MOUTH EVERY DAY   90 capsule   3   . HYDROCHLOROTHIAZIDE 25 MG PO TABS   Oral   Take 1 tablet (25 mg total) by mouth daily.   90 tablet   4   . MECLIZINE HCL 25 MG PO TABS   Oral   Take 25 mg by mouth 3 (three) times daily as needed. For dizziness         . OMEPRAZOLE 20 MG PO CPDR   Oral   Take 1 capsule (20 mg total) by mouth daily.   90 capsule   4     BP 113/62  Pulse 54  Temp 97.8 F (36.6 C) (Oral)  Resp 17  SpO2 98%  Physical Exam  Nursing note and vitals reviewed. Constitutional: She is oriented to person, place, and time. She appears well-developed and well-nourished. She appears distressed (anxious appearing).  HENT:  Head: Normocephalic and atraumatic.  Nose: Nose normal.  Mouth/Throat: Oropharynx is clear and moist.  Eyes: Conjunctivae normal and EOM are normal. Pupils are equal, round, and reactive to light.  Neck: Normal range of motion. Neck supple. No JVD present. No tracheal deviation present. No thyromegaly present.  Cardiovascular: Normal rate, regular rhythm, normal heart sounds and intact distal pulses.  Exam reveals no gallop and no friction rub.   No murmur heard. Pulmonary/Chest: Effort normal and breath sounds normal. No stridor. No respiratory distress. She has no wheezes. She has no rales. She exhibits no tenderness.  Abdominal: Soft. Bowel sounds are normal. She exhibits no distension and no mass. There is tenderness (mild diffuse tenderness). There is no rebound and no guarding.  Genitourinary:       Rectal exam with external hemorrhoids, no thrombosis or acute inflammation noted  Musculoskeletal: Normal range of motion. She exhibits no edema and no tenderness.  Lymphadenopathy:    She has no cervical  adenopathy.  Neurological: She is alert and oriented to person, place, and time. She exhibits normal muscle tone. Coordination normal.  Skin: Skin is warm and dry. No rash noted. No erythema. No pallor.  Psychiatric: Her behavior is normal. Judgment and thought content normal.    ED Course  Procedures (including critical care time)  Labs Reviewed  URINALYSIS, ROUTINE W REFLEX MICROSCOPIC - Abnormal; Notable for the following:    Color, Urine STRAW (*)     Leukocytes, UA MODERATE (*)     All other components within normal limits  CBC WITH DIFFERENTIAL - Abnormal; Notable for the following:    Hemoglobin 11.7 (*)     HCT 35.5 (*)     All other components within normal limits  COMPREHENSIVE METABOLIC PANEL - Abnormal; Notable for the following:    Potassium 3.1 (*)     Glucose, Bld 100 (*)     Total Bilirubin 0.2 (*)     GFR calc non Af Amer 68 (*)     GFR calc Af Amer 79 (*)     All other components within normal limits  LIPASE, BLOOD  URINE MICROSCOPIC-ADD ON  TROPONIN I   Dg Chest 2 View  11/20/2012  *RADIOLOGY REPORT*  Clinical Data: Chest pain  CHEST - 2 VIEW  Comparison: 10/26/2010  Findings: Heart size within normal limits.  Aortic atherosclerotic calcification.  No confluent airspace opacity, pleural effusion, or pneumothorax.  No acute osseous finding.  IMPRESSION: No radiographic evidence of acute cardiopulmonary process.   Original Report Authenticated By: Jearld Lesch, M.D.      1. GERD (gastroesophageal reflux disease)   2. Hypokalemia   3. Hemorrhoids   4. Atypical chest pain       MDM  Patient reports resolution of GERD symptoms, no further right arm or shoulder pain. Will treat with Protonix, give patient steroid suppositories for possible inflammation of her hemorrhoids causing her to itch rectally. She will be advised to avoid spicy or acidic foods.        Olivia Mackie, MD 11/20/12 0200

## 2012-11-20 NOTE — ED Notes (Signed)
Pt states she feels much better and is ready to go home.

## 2012-11-20 NOTE — ED Notes (Signed)
Pt amb to [facility] without problem. Daughter at bedside. Pt states she is feeling better

## 2012-11-28 ENCOUNTER — Encounter (HOSPITAL_COMMUNITY): Payer: Self-pay | Admitting: Emergency Medicine

## 2012-11-28 ENCOUNTER — Emergency Department (INDEPENDENT_AMBULATORY_CARE_PROVIDER_SITE_OTHER)
Admission: EM | Admit: 2012-11-28 | Discharge: 2012-11-28 | Disposition: A | Discharge status: Home / Self Care | Payer: Medicare Other | Source: Home / Self Care | Attending: Family Medicine | Admitting: Family Medicine

## 2012-11-28 DIAGNOSIS — J209 Acute bronchitis, unspecified: Secondary | ICD-10-CM

## 2012-11-28 MED ORDER — BENZONATATE 200 MG PO CAPS: 200.0000 mg | ORAL_CAPSULE | Freq: Three times a day (TID) | ORAL | Status: DC | PRN

## 2012-11-28 MED ORDER — AZITHROMYCIN 250 MG PO TABS: ORAL_TABLET | ORAL | Status: DC

## 2012-11-28 MED ORDER — ALBUTEROL SULFATE HFA 108 (90 BASE) MCG/ACT IN AERS: 1.0000 | INHALATION_SPRAY | Freq: Four times a day (QID) | RESPIRATORY_TRACT | Status: DC | PRN

## 2012-11-28 MED ORDER — PREDNISONE 5 MG PO KIT: 1.0000 | PACK | Freq: Every day | ORAL | Status: DC

## 2012-11-28 NOTE — ED Notes (Signed)
Pt c/o dry nonproductive cough x 1wk with back and rib pain. Pt has tried several otc medications with no relief of symptoms. Pt denies fever, n/v/d.

## 2012-11-28 NOTE — ED Notes (Signed)
Waiting discharge papers 

## 2012-11-28 NOTE — ED Provider Notes (Signed)
Chief Complaint  Patient presents with  . Cough    dry nonprodutive cough. back and rib pain from coughing. pt denies fever, n/v/d    History of Present Illness:   The patient is a 66 year old female with a one-week history of dry cough, wheezing and chest tightness, and pain in her chest rated 9/10 in intensity. She's also had chills, myalgias, sneezing, aching in her ear is, headache, and sore throat. She denies any GI symptoms. She's had no known sick exposures. She has not tried anything for symptomatic relief.  Review of Systems:  Other than noted above, the patient denies any of the following symptoms. Systemic:  No fever, chills, sweats, fatigue, myalgias, headache, or anorexia. Eye:  No redness, pain or drainage. ENT:  No earache, ear congestion, nasal congestion, sneezing, rhinorrhea, sinus pressure, sinus pain, post nasal drip, or sore throat. Lungs:  No cough, sputum production, wheezing, shortness of breath, or chest pain. GI:  No abdominal pain, nausea, vomiting, or diarrhea.  PMFSH:  Past medical history, family history, social history, meds, and allergies were reviewed.  Physical Exam:   Vital signs:  BP 149/82  Pulse 62  Temp 98.1 F (36.7 C) (Oral)  Resp 22  SpO2 99% General:  Alert, in no distress. Eye:  No conjunctival injection or drainage. Lids were normal. ENT:  TMs and canals were normal, without erythema or inflammation.  Nasal mucosa was clear and uncongested, without drainage.  Mucous membranes were moist.  Pharynx was clear, without exudate or drainage.  There were no oral ulcerations or lesions. Neck:  Supple, no adenopathy, tenderness or mass. Lungs:  No respiratory distress.  Lungs were clear to auscultation, without wheezes, rales or rhonchi.  Breath sounds were clear and equal bilaterally.  Heart:  Regular rhythm, without gallops, murmers or rubs. Skin:  Clear, warm, and dry, without rash or lesions.  Assessment:  The encounter diagnosis was Acute  bronchitis.  Plan:   1.  The following meds were prescribed:   New Prescriptions   ALBUTEROL (PROVENTIL HFA;VENTOLIN HFA) 108 (90 BASE) MCG/ACT INHALER    Inhale 1-2 puffs into the lungs every 6 (six) hours as needed for wheezing.   AZITHROMYCIN (ZITHROMAX Z-PAK) 250 MG TABLET    Take as directed.   BENZONATATE (TESSALON) 200 MG CAPSULE    Take 1 capsule (200 mg total) by mouth 3 (three) times daily as needed for cough.   PREDNISONE 5 MG KIT    Take 1 kit (5 mg total) by mouth daily after breakfast. Prednisone 5 mg 6 day dosepack.  Take as directed.   2.  The patient was instructed in symptomatic care and handouts were given. 3.  The patient was told to return if becoming worse in any way, if no better in 3 or 4 days, and given some red flag symptoms that would indicate earlier return.   Reuben Likes, MD 11/28/12 2005

## 2012-12-01 ENCOUNTER — Encounter: Payer: Self-pay | Admitting: Family Medicine

## 2012-12-01 ENCOUNTER — Ambulatory Visit (INDEPENDENT_AMBULATORY_CARE_PROVIDER_SITE_OTHER): Payer: Medicare HMO | Admitting: Family Medicine

## 2012-12-01 VITALS — BP 102/80 | HR 61 | Temp 98.4°F | Wt 175.0 lb

## 2012-12-01 DIAGNOSIS — J069 Acute upper respiratory infection, unspecified: Secondary | ICD-10-CM

## 2012-12-01 MED ORDER — ALBUTEROL SULFATE HFA 108 (90 BASE) MCG/ACT IN AERS: 2.0000 | INHALATION_SPRAY | Freq: Four times a day (QID) | RESPIRATORY_TRACT | Status: DC | PRN

## 2012-12-01 NOTE — Progress Notes (Signed)
Chief Complaint  Patient presents with  . Follow-up    bronchitis with wheezing     HPI:  Acute visit for cough: -started: 11/21/12 per review of [facility] notes from 12/30, tx with albuterol, z-pack, prednisone and tessalon perles in [facility] on 2 days ago - here for follow up -symptoms:nasal congestion, sore throat, cough, wheezing, chills, myalgias, ear pain, HA -denies:fever, SOB, NVD, tooth pain, strep or mono exposure -has tried: meds listed above -sick contacts: non known -Hx of: anxiety, no hx of asthma, COPD or DM - around smoke at home   ROS: See pertinent positives and negatives per HPI.  Past Medical History  Diagnosis Date  . ABDOMINAL PAIN, RECURRENT 03/29/2008  . ANXIETY 10/07/2007  . Candidiasis of mouth 05/30/2010  . GERD 07/25/2007  . HYPERTENSION 07/25/2007    Family History  Problem Relation Age of Onset  . Kidney disease Neg Hx   . Stroke Neg Hx   . Diabetes Neg Hx     History   Social History  . Marital Status: Married    Spouse Name: N/A    Number of Children: N/A  . Years of Education: N/A   Social History Main Topics  . Smoking status: Never Smoker   . Smokeless tobacco: Never Used  . Alcohol Use: No  . Drug Use: No  . Sexually Active: No   Other Topics Concern  . None   Social History Narrative  . None    Current outpatient prescriptions:albuterol (PROVENTIL HFA;VENTOLIN HFA) 108 (90 BASE) MCG/ACT inhaler, Inhale 1-2 puffs into the lungs every 6 (six) hours as needed for wheezing., Disp: 1 Inhaler, Rfl: 0;  aspirin 81 MG tablet, Take 81 mg by mouth daily.  , Disp: , Rfl: ;  azithromycin (ZITHROMAX Z-PAK) 250 MG tablet, Take as directed., Disp: 6 tablet, Rfl: 0 benzonatate (TESSALON) 200 MG capsule, Take 1 capsule (200 mg total) by mouth 3 (three) times daily as needed for cough., Disp: 30 capsule, Rfl: 0;  clonazePAM (KLONOPIN) 1 MG tablet, Take 1 tablet (1 mg total) by mouth 2 (two) times daily as needed., Disp: 60 tablet, Rfl: 0;  FLUoxetine  (PROZAC) 20 MG capsule, TAKE 1 CAPSULE BY MOUTH EVERY DAY, Disp: 90 capsule, Rfl: 3 hydrochlorothiazide (HYDRODIURIL) 25 MG tablet, Take 1 tablet (25 mg total) by mouth daily., Disp: 90 tablet, Rfl: 4;  hydrocortisone (ANUSOL-HC) 2.5 % rectal cream, Apply rectally 2 times daily, Disp: 30 g, Rfl: 0;  meclizine (ANTIVERT) 25 MG tablet, Take 25 mg by mouth 3 (three) times daily as needed. For dizziness, Disp: , Rfl:  PredniSONE 5 MG KIT, Take 1 kit (5 mg total) by mouth daily after breakfast. Prednisone 5 mg 6 day dosepack.  Take as directed., Disp: 1 kit, Rfl: 0;  sucralfate (CARAFATE) 1 G tablet, Take 1 tablet (1 g total) by mouth 4 (four) times daily., Disp: 30 tablet, Rfl: 0;  albuterol (PROVENTIL HFA;VENTOLIN HFA) 108 (90 BASE) MCG/ACT inhaler, Inhale 2 puffs into the lungs every 6 (six) hours as needed for wheezing., Disp: 1 Inhaler, Rfl: 0 pantoprazole (PROTONIX) 20 MG tablet, Take 2 tablets (40 mg total) by mouth daily., Disp: 30 tablet, Rfl: 0;  [DISCONTINUED] omeprazole (PRILOSEC) 20 MG capsule, Take 1 capsule (20 mg total) by mouth daily., Disp: 90 capsule, Rfl: 4  EXAM:  Filed Vitals:   12/01/12 1402  BP: 102/80  Pulse: 61  Temp: 98.4 F (36.9 C)   There is no height on file to calculate BMI.  GENERAL:  vitals reviewed and listed above, alert, oriented, appears well hydrated and in no acute distress  HEENT: atraumatic, conjunttiva clear, no obvious abnormalities on inspection of external nose and ears, clear rhinorrhea, PND  NECK: no obvious masses on inspection  LUNGS: clear to auscultation bilaterally, no wheezes, rales or rhonchi, good air movement  CV: HRRR, no peripheral edema  MS: moves all extremities without noticeable abnormality  PSYCH: pleasant and cooperative, no obvious depression or anxiety  ASSESSMENT AND PLAN:  Discussed the following assessment and plan:  1. Upper respiratory infection  albuterol (PROVENTIL HFA;VENTOLIN HFA) 108 (90 BASE) MCG/ACT inhaler    -likely resolving flu-like illness or viral infection. Exam normal today other then nasal congestion and PND. Per symptoms report possible RAD - albuterol given. -Patient advised to return or notify a doctor immediately if symptoms worsen or persist or new concerns arise.  Patient Instructions  INSTRUCTIONS FOR UPPER RESPIRATORY INFECTION:  -plenty of rest and fluids  -use albuterol if needed for wheezing  -finish course of prednisone and antibiotic  -nasal saline wash 2-3 times daily (use prepackaged nasal saline or bottled/distilled water if making your own)   -can use sinex nasal spray for drainage and nasal congestion - but do NOT use longer then 3-4 days  -can use tylenol or ibuprofen as directed for aches and sorethroat  -in the winter time, using a humidifier at night is helpful (please follow cleaning instructions)  -if you are taking a cough medication - use only as directed, may also try a teaspoon of honey to coat the throat and throat lozenges  -for sore throat, salt water gargles can help  -follow up if you have fevers, facial pain, tooth pain, difficulty breathing or are worsening or not getting better in 5-7 days      KIM, HANNAH R.

## 2012-12-01 NOTE — Patient Instructions (Addendum)
INSTRUCTIONS FOR UPPER RESPIRATORY INFECTION:  -plenty of rest and fluids  -use albuterol if needed for wheezing  -finish course of prednisone and antibiotic  -nasal saline wash 2-3 times daily (use prepackaged nasal saline or bottled/distilled water if making your own)   -can use sinex nasal spray for drainage and nasal congestion - but do NOT use longer then 3-4 days  -can use tylenol or ibuprofen as directed for aches and sorethroat  -in the winter time, using a humidifier at night is helpful (please follow cleaning instructions)  -if you are taking a cough medication - use only as directed, may also try a teaspoon of honey to coat the throat and throat lozenges  -for sore throat, salt water gargles can help  -follow up if you have fevers, facial pain, tooth pain, difficulty breathing or are worsening or not getting better in 5-7 days

## 2012-12-04 ENCOUNTER — Emergency Department (HOSPITAL_COMMUNITY)
Admission: EM | Admit: 2012-12-04 | Discharge: 2012-12-04 | Disposition: A | Discharge status: Home / Self Care | Payer: Medicare HMO | Attending: Emergency Medicine | Admitting: Emergency Medicine

## 2012-12-04 ENCOUNTER — Emergency Department (HOSPITAL_COMMUNITY): Payer: Medicare HMO

## 2012-12-04 ENCOUNTER — Encounter (HOSPITAL_COMMUNITY): Payer: Self-pay | Admitting: *Deleted

## 2012-12-04 DIAGNOSIS — J3489 Other specified disorders of nose and nasal sinuses: Secondary | ICD-10-CM | POA: Insufficient documentation

## 2012-12-04 DIAGNOSIS — R110 Nausea: Secondary | ICD-10-CM | POA: Insufficient documentation

## 2012-12-04 DIAGNOSIS — Z8719 Personal history of other diseases of the digestive system: Secondary | ICD-10-CM | POA: Insufficient documentation

## 2012-12-04 DIAGNOSIS — I10 Essential (primary) hypertension: Secondary | ICD-10-CM | POA: Insufficient documentation

## 2012-12-04 DIAGNOSIS — Z90710 Acquired absence of both cervix and uterus: Secondary | ICD-10-CM | POA: Insufficient documentation

## 2012-12-04 DIAGNOSIS — J40 Bronchitis, not specified as acute or chronic: Secondary | ICD-10-CM | POA: Insufficient documentation

## 2012-12-04 DIAGNOSIS — Z79899 Other long term (current) drug therapy: Secondary | ICD-10-CM | POA: Insufficient documentation

## 2012-12-04 DIAGNOSIS — Z8619 Personal history of other infectious and parasitic diseases: Secondary | ICD-10-CM | POA: Insufficient documentation

## 2012-12-04 DIAGNOSIS — Z7982 Long term (current) use of aspirin: Secondary | ICD-10-CM | POA: Insufficient documentation

## 2012-12-04 DIAGNOSIS — IMO0002 Reserved for concepts with insufficient information to code with codable children: Secondary | ICD-10-CM | POA: Insufficient documentation

## 2012-12-04 DIAGNOSIS — R0602 Shortness of breath: Secondary | ICD-10-CM | POA: Insufficient documentation

## 2012-12-04 DIAGNOSIS — R062 Wheezing: Secondary | ICD-10-CM | POA: Insufficient documentation

## 2012-12-04 DIAGNOSIS — K219 Gastro-esophageal reflux disease without esophagitis: Secondary | ICD-10-CM | POA: Insufficient documentation

## 2012-12-04 DIAGNOSIS — Z9889 Other specified postprocedural states: Secondary | ICD-10-CM | POA: Insufficient documentation

## 2012-12-04 DIAGNOSIS — F411 Generalized anxiety disorder: Secondary | ICD-10-CM | POA: Insufficient documentation

## 2012-12-04 HISTORY — DX: Diaphragmatic hernia without obstruction or gangrene: K44.9

## 2012-12-04 MED ORDER — GUAIFENESIN ER 600 MG PO TB12: 1200.0000 mg | ORAL_TABLET | Freq: Two times a day (BID) | ORAL | Status: DC

## 2012-12-04 MED ORDER — ALBUTEROL SULFATE (5 MG/ML) 0.5% IN NEBU
5.0000 mg | INHALATION_SOLUTION | Freq: Once | RESPIRATORY_TRACT | Status: AC
  Administered 2012-12-04: 5 mg via RESPIRATORY_TRACT
  Filled 2012-12-04 (×2): qty 1

## 2012-12-04 MED ORDER — BENZONATATE 100 MG PO CAPS
100.0000 mg | ORAL_CAPSULE | Freq: Once | ORAL | Status: AC
  Administered 2012-12-04: 100 mg via ORAL
  Filled 2012-12-04: qty 1

## 2012-12-04 MED ORDER — IPRATROPIUM BROMIDE 0.02 % IN SOLN
0.5000 mg | Freq: Once | RESPIRATORY_TRACT | Status: AC
  Administered 2012-12-04: 0.5 mg via RESPIRATORY_TRACT
  Filled 2012-12-04 (×2): qty 2.5

## 2012-12-04 MED ORDER — IBUPROFEN 800 MG PO TABS
800.0000 mg | ORAL_TABLET | Freq: Once | ORAL | Status: AC
  Administered 2012-12-04: 800 mg via ORAL
  Filled 2012-12-04: qty 1

## 2012-12-04 MED ORDER — HYDROCOD POLST-CHLORPHEN POLST 10-8 MG/5ML PO LQCR
5.0000 mL | Freq: Once | ORAL | Status: AC
  Administered 2012-12-04: 5 mL via ORAL
  Filled 2012-12-04: qty 5

## 2012-12-04 MED ORDER — CETIRIZINE-PSEUDOEPHEDRINE ER 5-120 MG PO TB12: 1.0000 | ORAL_TABLET | Freq: Every day | ORAL | Status: DC

## 2012-12-04 NOTE — ED Notes (Signed)
WUJ:[id number]<JY> Expected date:12/04/12<BR> Expected time: 7:54 PM<BR> Means of arrival:Ambulance<BR> Comments:<BR>

## 2012-12-04 NOTE — ED Notes (Signed)
Per EMS report: pt from home: 2 weeks ago went to urgent care for her bronchitis.  Pt has finished taking steroids, antibiotics, and nasal decongestant with no relief.  Pt has a constant nonproductive cough.  Pt put on humidified O2 by EMS and pt reported that it helped. Pt unable to keep foods down.  BP 150/82, HR: 72, 98%RA, RR: 16 (if not coughing.)

## 2012-12-04 NOTE — ED Notes (Signed)
RT at bedside.

## 2012-12-04 NOTE — ED Provider Notes (Signed)
History     CSN: [id number]  Arrival date & time 12/04/12  2001   First MD Initiated Contact with Patient 12/04/12 2031      Chief Complaint  Patient presents with  . Bronchitis   HPI  History provided by the patient. Patient is a 67 year old female with history of hypertension who presents with complaints of persistent cough. Patient reports having cough for the past several weeks. She was seen in urgent care Center 2 weeks ago and diagnosed with bronchitis. Patient reports taking steroids, albuterol inhaler and antibiotics but has not had any change in symptoms. She continues to have some nasal congestion and drainage. Denies any hemoptysis. Denies any chest pain. Patient has also begun using a humidifier to air without any significant improvement. Patient states that she has the same symptoms every year. Has no prior history of asthma or lung condition. Has no known seasonal allergies. Symptoms have not been associated with any fever, chills or sweats. No vomiting or diarrhea symptoms.    Past Medical History  Diagnosis Date  . ABDOMINAL PAIN, RECURRENT 03/29/2008  . ANXIETY 10/07/2007  . Candidiasis of mouth 05/30/2010  . GERD 07/25/2007  . HYPERTENSION 07/25/2007  . Hernia, hiatal     Past Surgical History  Procedure Date  . Abdominal hysterectomy   . Knee arthroscopy   . Breast surgery     bx    Family History  Problem Relation Age of Onset  . Kidney disease Neg Hx   . Stroke Neg Hx   . Diabetes Neg Hx     History  Substance Use Topics  . Smoking status: Never Smoker   . Smokeless tobacco: Never Used  . Alcohol Use: Yes     Comment: occasional    OB History    Grav Para Term Preterm Abortions TAB SAB Ect Mult Living                  Review of Systems  Constitutional: Negative for fever, chills and appetite change.  Respiratory: Positive for cough, shortness of breath and wheezing.   Cardiovascular: Negative for chest pain.  Gastrointestinal: Positive for  nausea. Negative for vomiting, abdominal pain, diarrhea and constipation.  All other systems reviewed and are negative.    Allergies  Codeine phosphate  Home Medications   Current Outpatient Rx  Name  Route  Sig  Dispense  Refill  . ALBUTEROL SULFATE HFA 108 (90 BASE) MCG/ACT IN AERS   Inhalation   Inhale 1-2 puffs into the lungs every 6 (six) hours as needed for wheezing.   1 Inhaler   0   . ASPIRIN EC 81 MG PO TBEC   Oral   Take 81 mg by mouth daily.         . AZITHROMYCIN 250 MG PO TABS      Take as directed.   6 tablet   0   . BENZONATATE 200 MG PO CAPS   Oral   Take 1 capsule (200 mg total) by mouth 3 (three) times daily as needed for cough.   30 capsule   0   . CLONAZEPAM 1 MG PO TABS   Oral   Take 1 tablet (1 mg total) by mouth 2 (two) times daily as needed.   60 tablet   0   . FLUOXETINE HCL 20 MG PO CAPS      TAKE 1 CAPSULE BY MOUTH EVERY DAY   90 capsule   3   . HYDROCHLOROTHIAZIDE 25  MG PO TABS   Oral   Take 1 tablet (25 mg total) by mouth daily.   90 tablet   4   . PANTOPRAZOLE SODIUM 20 MG PO TBEC   Oral   Take 2 tablets (40 mg total) by mouth daily.   30 tablet   0   . SUCRALFATE 1 G PO TABS   Oral   Take 1 tablet (1 g total) by mouth 4 (four) times daily.   30 tablet   0     BP 157/69  Pulse 73  Temp 98.4 F (36.9 C) (Oral)  Resp 20  SpO2 98%  Physical Exam  Nursing note and vitals reviewed. Constitutional: She is oriented to person, place, and time. She appears well-developed and well-nourished. No distress.  HENT:  Head: Normocephalic.  Mouth/Throat: Oropharynx is clear and moist.  Neck: Normal range of motion. Neck supple.       No meningeal signs  Cardiovascular: Normal rate and regular rhythm.   No murmur heard. Pulmonary/Chest: Effort normal. No stridor. No respiratory distress. She has wheezes. She has no rales.  Abdominal: Soft. There is no tenderness. There is no rebound and no guarding.  Musculoskeletal:  She exhibits no edema and no tenderness.       No clinical signs concerning for DVT  Neurological: She is alert and oriented to person, place, and time.  Skin: Skin is warm and dry.  Psychiatric: She has a normal mood and affect. Her behavior is normal.    ED Course  Procedures   Dg Chest 2 View  12/04/2012  *RADIOLOGY REPORT*  Clinical Data: Cough.  Bronchitis.  CHEST - 2 VIEW  Comparison: PA and lateral chest 11/19/2012.  Findings: Peribronchial thickening without consolidative process is identified.  No pneumothorax or pleural fluid.  Heart size normal.  IMPRESSION: Bronchitic change without focal process.   Original Report Authenticated By: Holley Dexter, M.D.      1. Bronchitis       MDM  9:20 PM patient seen and evaluated. Patient sitting comfortable in bed and appears well in no acute distress. Normal respirations and O2 sats.   Patient looking more comfortable. She has some improvements of coughing. X-rays unremarkable aside from some bronchitic changes. At this time patient felt stable for discharge home. Will give additional symptomatic treatments with Mucinex and Zyrtec.     Angus Seller, [place] 12/05/12 [phone]

## 2012-12-04 NOTE — ED Notes (Signed)
PA at bedside.

## 2012-12-05 NOTE — ED Provider Notes (Signed)
Medical screening examination/treatment/procedure(s) were performed by non-physician practitioner and as supervising physician I was immediately available for consultation/collaboration.  Anthony T [name], MD 12/05/12 2318 

## 2012-12-12 ENCOUNTER — Telehealth: Payer: Self-pay | Admitting: Internal Medicine

## 2012-12-12 NOTE — Telephone Encounter (Signed)
Persistent cough following a viral bronchitis might persist for several weeks. Notify patient and no further antibiotic therapy is necessary. Okay to refill cough medicine  If she desires

## 2012-12-12 NOTE — Telephone Encounter (Signed)
Spoke to pt told her cough following viral bronchitis might persists for several weeks, no further antibiotics is necessary per Dr. Amador Cunas. Told pt can refill cough medicine if she wants. Pt verbalized understanding and stated she can not afford Rx right now is taking OTC cough medicine. Told pt okay if develops fever or symptoms worsen to call back. Pt verbalized understanding.

## 2012-12-12 NOTE — Telephone Encounter (Signed)
Pt has had a cough since 12/20. No fever. States she has been to the hospital, UC, saw Dr. Selena Batten, has tried humidifier, was told in hospital she had bronchitis, uses inhaler, & she is still not better. States she needs something strong, but can't afford any rx right now. She's been on Zpack, Prednisone, inhaler, cough syrup, and nebulizer. States the hospital told her to come see Dr. Kirtland Bouchard in 7 days, but she states she cannot afford it and doesn't feel it will do any good, since she's been everywhere. She wants to know what to do. Please advise.

## 2012-12-20 ENCOUNTER — Telehealth: Payer: Self-pay | Admitting: Internal Medicine

## 2012-12-20 NOTE — Telephone Encounter (Signed)
Patient Information:  Caller Name: Desmond  Phone: [phone]  Patient: Erica Morris  Gender: Female  DOB: [birth year]  Age: 67 Years  PCP: Eleonore Chiquito [facility])  Office Follow Up:  Does the office need to follow up with this patient?: Yes  Instructions For The Office: please call back; offered appt, but she only wants to be admitted to the hospital by [facility]   Symptoms  Reason For Call & Symptoms: says she needs to be put in the hospital; says she has had the cough since Christmas; seen by four doctors, including two EDs and an UC; has been placed on meds, but nothing is working; cough is nonproductive; cough is worse at night, which makes her SOB; says walking to the mailbox makes her SOB, which makes her anxious  Reviewed Health History In EMR: Yes  Reviewed Medications In EMR: Yes  Reviewed Allergies In EMR: Yes  Reviewed Surgeries / Procedures: Yes  Date of Onset of Symptoms: 11/23/2012  Treatments Tried: Albuterol, Mucinex, Tessalon  Treatments Tried Worked: No  Guideline(s) Used:  Cough  Disposition Per Guideline:   See Today in Office  Reason For Disposition Reached:   Sinus pain persists after using nasal washes and pain medicine > 24 hours  Advice Given:  Call Back If:  You become worse.  Patient Refused Recommendation:  Patient Refused Care Advice  Please call pt back; the only thing she is interested in is being put in the hospital

## 2012-12-20 NOTE — Telephone Encounter (Signed)
Please schedule ROV tomarrow

## 2012-12-20 NOTE — Telephone Encounter (Signed)
Spoke to pt told her schedulers will call her first thing in the morning to see Dr. Amador Cunas tomorrow. Pt verbalized understanding.

## 2012-12-20 NOTE — Telephone Encounter (Signed)
Our docs don't usually admit. I think she would need to go through ED for that. Please advise.

## 2012-12-21 ENCOUNTER — Encounter: Payer: Self-pay | Admitting: Internal Medicine

## 2012-12-21 ENCOUNTER — Ambulatory Visit (INDEPENDENT_AMBULATORY_CARE_PROVIDER_SITE_OTHER): Payer: Medicare HMO | Admitting: Internal Medicine

## 2012-12-21 VITALS — BP 130/80 | HR 68 | Temp 97.7°F | Resp 20 | Wt 180.0 lb

## 2012-12-21 DIAGNOSIS — F411 Generalized anxiety disorder: Secondary | ICD-10-CM

## 2012-12-21 DIAGNOSIS — J40 Bronchitis, not specified as acute or chronic: Secondary | ICD-10-CM

## 2012-12-21 DIAGNOSIS — I10 Essential (primary) hypertension: Secondary | ICD-10-CM

## 2012-12-21 MED ORDER — BUDESONIDE-FORMOTEROL FUMARATE 80-4.5 MCG/ACT IN AERO: 2.0000 | INHALATION_SPRAY | Freq: Two times a day (BID) | RESPIRATORY_TRACT | Status: DC

## 2012-12-21 MED ORDER — PREDNISONE 20 MG PO TABS: 20.0000 mg | ORAL_TABLET | Freq: Two times a day (BID) | ORAL | Status: DC

## 2012-12-21 NOTE — Patient Instructions (Signed)
Acute bronchitis symptoms are generally not helped by antibiotics.  Take over-the-counter expectorants and cough medications such as  Mucinex DM.  Call if there is no improvement in 5 to 7 days or if he developed worsening cough, fever, or new symptoms, such as shortness of breath or chest pain.  Symbicort 2 puffs twice daily Mucinex twice daily Albuterol inhaler every 6 hours Cough medicine as directed

## 2012-12-21 NOTE — Progress Notes (Signed)
Subjective:    Patient ID: Erica Morris, female    DOB: November 08, 1946, 67 y.o.   MRN: [id number]  HPI   67 year old patient who has been ill for about 4 weeks. She's had 3 ER visits one office visit in at least one urgent care visit over this period of time she's had 2 x-rays the second which revealed some bronchitic changes only. Her chief complaint is refractory nonproductive cough. She states that she has been miserable and requests hospital admission. No fever or chills. No history of tobacco use.   She has had mild situational stress and lost her mother in July and her husband in October  Past Medical History  Diagnosis Date  . ABDOMINAL PAIN, RECURRENT 03/29/2008  . ANXIETY 10/07/2007  . Candidiasis of mouth 05/30/2010  . GERD 07/25/2007  . HYPERTENSION 07/25/2007  . Hernia, hiatal     History   Social History  . Marital Status: Married    Spouse Name: N/A    Number of Children: N/A  . Years of Education: N/A   Occupational History  . Not on file.   Social History Main Topics  . Smoking status: Never Smoker   . Smokeless tobacco: Never Used  . Alcohol Use: Yes     Comment: occasional  . Drug Use: No  . Sexually Active: No   Other Topics Concern  . Not on file   Social History Narrative  . No narrative on file    Past Surgical History  Procedure Date  . Abdominal hysterectomy   . Knee arthroscopy   . Breast surgery     bx    Family History  Problem Relation Age of Onset  . Kidney disease Neg Hx   . Stroke Neg Hx   . Diabetes Neg Hx     Allergies  Allergen Reactions  . Codeine Phosphate Itching    Current Outpatient Prescriptions on File Prior to Visit  Medication Sig Dispense Refill  . albuterol (PROVENTIL HFA;VENTOLIN HFA) 108 (90 BASE) MCG/ACT inhaler Inhale 1-2 puffs into the lungs every 6 (six) hours as needed for wheezing.  1 Inhaler  0  . aspirin EC 81 MG tablet Take 81 mg by mouth daily.      . cetirizine-pseudoephedrine (ZYRTEC-D) 5-120 MG per  tablet Take 1 tablet by mouth daily.  30 tablet  0  . clonazePAM (KLONOPIN) 1 MG tablet Take 1 tablet (1 mg total) by mouth 2 (two) times daily as needed.  60 tablet  0  . FLUoxetine (PROZAC) 20 MG capsule TAKE 1 CAPSULE BY MOUTH EVERY DAY  90 capsule  3  . hydrochlorothiazide (HYDRODIURIL) 25 MG tablet Take 1 tablet (25 mg total) by mouth daily.  90 tablet  4  . pantoprazole (PROTONIX) 20 MG tablet Take 2 tablets (40 mg total) by mouth daily.  30 tablet  0  . guaiFENesin (MUCINEX) 600 MG 12 hr tablet Take 2 tablets (1,200 mg total) by mouth 2 (two) times daily.  20 tablet  0  . [DISCONTINUED] omeprazole (PRILOSEC) 20 MG capsule Take 1 capsule (20 mg total) by mouth daily.  90 capsule  4    BP 130/80  Pulse 68  Temp 97.7 F (36.5 C) (Oral)  Resp 20  Wt 180 lb (81.647 kg)  SpO2 96%       Review of Systems  Constitutional: Positive for fatigue.  HENT: Negative for hearing loss, congestion, sore throat, rhinorrhea, dental problem, sinus pressure and tinnitus.   Eyes: Negative  for pain, discharge and visual disturbance.  Respiratory: Positive for cough. Negative for shortness of breath.   Cardiovascular: Negative for chest pain, palpitations and leg swelling.  Gastrointestinal: Negative for nausea, vomiting, abdominal pain, diarrhea, constipation, blood in stool and abdominal distention.  Genitourinary: Negative for dysuria, urgency, frequency, hematuria, flank pain, vaginal bleeding, vaginal discharge, difficulty urinating, vaginal pain and pelvic pain.  Musculoskeletal: Negative for joint swelling, arthralgias and gait problem.  Skin: Negative for rash.  Neurological: Negative for dizziness, syncope, speech difficulty, weakness, numbness and headaches.  Hematological: Negative for adenopathy.  Psychiatric/Behavioral: Negative for behavioral problems, dysphoric mood and agitation. The patient is not nervous/anxious.        Objective:   Physical Exam  Constitutional: She is  oriented to person, place, and time. She appears well-developed and well-nourished.  HENT:  Head: Normocephalic.  Right Ear: External ear normal.  Left Ear: External ear normal.  Mouth/Throat: Oropharynx is clear and moist.  Eyes: Conjunctivae normal and EOM are normal. Pupils are equal, round, and reactive to light.  Neck: Normal range of motion. Neck supple. No thyromegaly present.  Cardiovascular: Normal rate, regular rhythm, normal heart sounds and intact distal pulses.   Pulmonary/Chest: Effort normal and breath sounds normal. No respiratory distress. She has no wheezes. She has no rales.       O2 saturation 96%  Abdominal: Soft. Bowel sounds are normal. She exhibits no mass. There is no tenderness.  Musculoskeletal: Normal range of motion.  Lymphadenopathy:    She has no cervical adenopathy.  Neurological: She is alert and oriented to person, place, and time.  Skin: Skin is warm and dry. No rash noted.  Psychiatric: She has a normal mood and affect. Her behavior is normal.          Assessment & Plan:   Viral bronchitis with persistent cough. We'll treat with antitussives short burst of steroids and start short-term inhalational bronchodilators with ICS.

## 2012-12-26 ENCOUNTER — Telehealth: Payer: Self-pay | Admitting: Internal Medicine

## 2012-12-26 NOTE — Telephone Encounter (Signed)
Patient Information:  Caller Name: Jameriah  Phone: [phone]  Patient: Erica Morris  Gender: Female  DOB: [birth year]  Age: 67 Years  PCP: Eleonore Chiquito [facility])  Office Follow Up:  Does the office need to follow up with this patient?: Yes  Instructions For The Office: panic attacks  RN Note:  Refuses to schedule her f/u appt. this week for her bronchitis due to no transportation or money for gas.  Symptoms  Reason For Call & Symptoms: Had another panic attack today and had to call 911.  They came out and checked her out.  Her systolic b/p was 098 and was told that it wasn't anything to worry about.  Denies any chest pain.  Denies any sx at this time.  Thinks that the Prozac and Clonazapam are not strong enough.  She has been dx and treated for bronchis and ws told to f/u in 7 days but states that she has no money to get to the office or any transportation.   Her cough is worse at night and has shortness of breath and states it has been this way since she was seen last week.  No sputum. Her mom died in 06-22-2012 and her husband in 10/13.  Reviewed Health History In EMR: Yes  Reviewed Medications In EMR: Yes  Reviewed Allergies In EMR: Yes  Reviewed Surgeries / Procedures: Yes  Date of Onset of Symptoms: 12/25/2012  Guideline(s) Used:  No Protocol Available - Information Only  Disposition Per Guideline:   Discuss with PCP and Callback by Nurse Today  Reason For Disposition Reached:   Nursing judgment  Advice Given:  N/A

## 2012-12-27 NOTE — Telephone Encounter (Signed)
Spoke to pt told her needs to make an appt to discuss anxiety medications and if she can not wait we would recommend behavioral health. Pt verbalized understanding and will call back to schedule when Dr. Kirtland Bouchard is back.

## 2012-12-27 NOTE — Telephone Encounter (Signed)
Continue current medications,,,,,,,, see Dr. Vergia Alcon when he gets back,,,,,,,, or if the patient does not feel like she can weight then we would recommendcone  behavioral health

## 2012-12-29 ENCOUNTER — Other Ambulatory Visit: Payer: Self-pay | Admitting: Internal Medicine

## 2013-01-12 ENCOUNTER — Emergency Department (HOSPITAL_COMMUNITY)
Admission: EM | Admit: 2013-01-12 | Discharge: 2013-01-12 | Disposition: A | Discharge status: Home / Self Care | Payer: Medicare HMO | Attending: Emergency Medicine | Admitting: Emergency Medicine

## 2013-01-12 ENCOUNTER — Emergency Department (HOSPITAL_COMMUNITY): Payer: Medicare HMO

## 2013-01-12 DIAGNOSIS — I10 Essential (primary) hypertension: Secondary | ICD-10-CM | POA: Insufficient documentation

## 2013-01-12 DIAGNOSIS — Z8659 Personal history of other mental and behavioral disorders: Secondary | ICD-10-CM | POA: Insufficient documentation

## 2013-01-12 DIAGNOSIS — Z8619 Personal history of other infectious and parasitic diseases: Secondary | ICD-10-CM | POA: Insufficient documentation

## 2013-01-12 DIAGNOSIS — R059 Cough, unspecified: Secondary | ICD-10-CM | POA: Insufficient documentation

## 2013-01-12 DIAGNOSIS — Z79899 Other long term (current) drug therapy: Secondary | ICD-10-CM | POA: Insufficient documentation

## 2013-01-12 DIAGNOSIS — R05 Cough: Secondary | ICD-10-CM

## 2013-01-12 DIAGNOSIS — Z7982 Long term (current) use of aspirin: Secondary | ICD-10-CM | POA: Insufficient documentation

## 2013-01-12 DIAGNOSIS — R51 Headache: Secondary | ICD-10-CM | POA: Insufficient documentation

## 2013-01-12 DIAGNOSIS — Z8719 Personal history of other diseases of the digestive system: Secondary | ICD-10-CM | POA: Insufficient documentation

## 2013-01-12 DIAGNOSIS — K219 Gastro-esophageal reflux disease without esophagitis: Secondary | ICD-10-CM | POA: Insufficient documentation

## 2013-01-12 MED ORDER — ALBUTEROL SULFATE (5 MG/ML) 0.5% IN NEBU
5.0000 mg | INHALATION_SOLUTION | Freq: Once | RESPIRATORY_TRACT | Status: AC
  Administered 2013-01-12: 5 mg via RESPIRATORY_TRACT
  Filled 2013-01-12: qty 40

## 2013-01-12 MED ORDER — IPRATROPIUM BROMIDE 0.02 % IN SOLN
0.5000 mg | Freq: Once | RESPIRATORY_TRACT | Status: AC
  Administered 2013-01-12: 0.5 mg via RESPIRATORY_TRACT
  Filled 2013-01-12: qty 2.5

## 2013-01-12 MED ORDER — BENZONATATE 100 MG PO CAPS: 100.0000 mg | ORAL_CAPSULE | Freq: Three times a day (TID) | ORAL | Status: DC

## 2013-01-12 MED ORDER — HYDROCOD POLST-CHLORPHEN POLST 10-8 MG/5ML PO LQCR
5.0000 mL | Freq: Once | ORAL | Status: AC
  Administered 2013-01-12: 5 mL via ORAL
  Filled 2013-01-12: qty 5

## 2013-01-12 MED ORDER — METOCLOPRAMIDE HCL 10 MG PO TABS
10.0000 mg | ORAL_TABLET | Freq: Once | ORAL | Status: AC
  Administered 2013-01-12: 10 mg via ORAL
  Filled 2013-01-12: qty 1

## 2013-01-12 MED ORDER — ACETAMINOPHEN 500 MG PO TABS
1000.0000 mg | ORAL_TABLET | Freq: Once | ORAL | Status: AC
  Administered 2013-01-12: 1000 mg via ORAL
  Filled 2013-01-12: qty 2

## 2013-01-12 MED ORDER — ALBUTEROL SULFATE HFA 108 (90 BASE) MCG/ACT IN AERS
2.0000 | INHALATION_SPRAY | Freq: Once | RESPIRATORY_TRACT | Status: AC
  Administered 2013-01-12: 2 via RESPIRATORY_TRACT
  Filled 2013-01-12: qty 6.7

## 2013-01-12 NOTE — ED Provider Notes (Signed)
I have supervised the resident on the management of this patient and agree with the note above. I personally interviewed and examined the patient and my addendum is below.   Erica Morris is a 67 y.o. female here with nonproductive cough for 2 months. Tried albuterol, zpack, not improved. Not a smoker. Came to ED multiple times in the last few months. No wheezing on exam. Occasionally have headaches with cough but no red flags. CXR nl. Felt better after nebs and tussionex. Will d/c home on tessalon pearls and albuterol prn.   Richardean Canal, MD 01/12/13 2221

## 2013-01-12 NOTE — ED Provider Notes (Signed)
History     CSN: [id number]  Arrival date & time 01/12/13  1955   First MD Initiated Contact with Patient 01/12/13 2045      Chief Complaint  Patient presents with  . Cough  . Headache  . Hypertension    (Consider location/radiation/quality/duration/timing/severity/associated sxs/prior Treatment)  HPI Erica Morris is a 67 y.o. female who was brought in to the ED for concern of cough.  Been going on for several weeks.  Has seen ED three times for this same thing.  Has tried Z-pack, prednisone, and albuterol all without avail.  No fevers.  Nonproductive.  No ACE inhibitor use.  No exposures to pertussis or TB.  No other symptoms.  Past Medical History  Diagnosis Date  . ABDOMINAL PAIN, RECURRENT 03/29/2008  . ANXIETY 10/07/2007  . Candidiasis of mouth 05/30/2010  . GERD 07/25/2007  . HYPERTENSION 07/25/2007  . Hernia, hiatal     Past Surgical History  Procedure Laterality Date  . Abdominal hysterectomy    . Knee arthroscopy    . Breast surgery      bx    Family History  Problem Relation Age of Onset  . Kidney disease Neg Hx   . Stroke Neg Hx   . Diabetes Neg Hx     History  Substance Use Topics  . Smoking status: Never Smoker   . Smokeless tobacco: Never Used  . Alcohol Use: Yes     Comment: occasional    OB History   Grav Para Term Preterm Abortions TAB SAB Ect Mult Living                  Review of Systems  Constitutional: Negative for fever and chills.  HENT: Negative for congestion, rhinorrhea, neck pain and neck stiffness.   Respiratory: Positive for cough. Negative for shortness of breath.   Cardiovascular: Negative for chest pain.  Gastrointestinal: Negative for nausea, vomiting, abdominal pain, diarrhea and abdominal distention.  Endocrine: Negative for polyuria.  Genitourinary: Negative for dysuria.  Skin: Negative for rash.  Neurological: Negative for headaches.  Psychiatric/Behavioral: Negative.   All other systems reviewed and are  negative.    Allergies  Codeine phosphate  Home Medications   Current Outpatient Rx  Name  Route  Sig  Dispense  Refill  . albuterol (PROVENTIL HFA;VENTOLIN HFA) 108 (90 BASE) MCG/ACT inhaler   Inhalation   Inhale 1-2 puffs into the lungs every 6 (six) hours as needed for wheezing.   1 Inhaler   0   . aspirin EC 81 MG tablet   Oral   Take 81 mg by mouth daily.         . budesonide-formoterol (SYMBICORT) 80-4.5 MCG/ACT inhaler   Inhalation   Inhale 2 puffs into the lungs 2 (two) times daily.   1 Inhaler   3   . clonazePAM (KLONOPIN) 1 MG tablet      TAKE 1 TABLET BY MOUTH TWICE A DAY AS NEEDED   60 tablet   2   . FLUoxetine (PROZAC) 20 MG capsule      TAKE 1 CAPSULE BY MOUTH EVERY DAY   90 capsule   3   . hydrochlorothiazide (HYDRODIURIL) 25 MG tablet   Oral   Take 1 tablet (25 mg total) by mouth daily.   90 tablet   4     BP 142/82  Pulse 73  Temp(Src) 98.4 F (36.9 C) (Oral)  Resp 18  SpO2 99%  Physical Exam  Constitutional: She  is oriented to person, place, and time. She appears well-developed and well-nourished. No distress.  HENT:  Head: Normocephalic and atraumatic.  Right Ear: External ear normal.  Left Ear: External ear normal.  Nose: Nose normal.  Mouth/Throat: Oropharynx is clear and moist. No oropharyngeal exudate.  Eyes: EOM are normal. Pupils are equal, round, and reactive to light.  Neck: Normal range of motion. Neck supple. No tracheal deviation present.  Cardiovascular: Normal rate.   Pulmonary/Chest: Effort normal and breath sounds normal. No stridor. No respiratory distress. She has no wheezes. She has no rales.  Abdominal: Soft. She exhibits no distension. There is no tenderness. There is no rebound.  Musculoskeletal: Normal range of motion.  Neurological: She is alert and oriented to person, place, and time.  Skin: Skin is warm and dry. She is not diaphoretic.    ED Course  Procedures (including critical care time)  Labs  Reviewed - No data to display Dg Chest 2 View  01/12/2013  *RADIOLOGY REPORT*  Clinical Data: Persistent cough, shortness of breath  CHEST - 2 VIEW  Comparison: 12/04/2012; 10/26/2010; 03/27/2008  Findings: Grossly unchanged cardiac silhouette and mediastinal contours with atherosclerotic calcifications within the thoracic aorta.  Unchanged minimal left basilar subsegmental atelectasis or scar.  No focal airspace opacity.  No pleural effusion or pneumothorax.  Unchanged bones.  IMPRESSION: No acute cardiopulmonary disease.   Original Report Authenticated By: Tacey Ruiz, MD      1. Cough       MDM   Erica Morris is a 67 y.o. female who was brought in to the ED for concern of several weeks of dry cough.  Completely benign exam.  No concern for TB/pertussis.  CXR repeated and negative.  Albuterol given and patient with much symptomatic improvement.  Patient nonsmoker without diagnosis of COPD.  Will treat with Tessalon pearls.  Patient safe for outpatient management.  Recommend close f/u with PCP.  Patient discharged.       Arloa Koh, MD 01/12/13 [phone]

## 2013-01-12 NOTE — ED Notes (Signed)
EMS-pt reports cough since December. Reports has been seen by several facilities and has taken antibiotics without relief. Pt also reports headache and increased blood pressure.

## 2013-01-12 NOTE — ED Notes (Signed)
Pt ambulatory to restroom

## 2013-02-21 ENCOUNTER — Emergency Department (HOSPITAL_COMMUNITY): Payer: Medicare HMO

## 2013-02-21 ENCOUNTER — Emergency Department (HOSPITAL_COMMUNITY)
Admission: EM | Admit: 2013-02-21 | Discharge: 2013-02-21 | Disposition: A | Discharge status: Home / Self Care | Payer: Medicare HMO | Attending: Emergency Medicine | Admitting: Emergency Medicine

## 2013-02-21 ENCOUNTER — Encounter (HOSPITAL_COMMUNITY): Payer: Self-pay | Admitting: Emergency Medicine

## 2013-02-21 DIAGNOSIS — I10 Essential (primary) hypertension: Secondary | ICD-10-CM | POA: Insufficient documentation

## 2013-02-21 DIAGNOSIS — T148XXA Other injury of unspecified body region, initial encounter: Secondary | ICD-10-CM

## 2013-02-21 DIAGNOSIS — R42 Dizziness and giddiness: Secondary | ICD-10-CM | POA: Insufficient documentation

## 2013-02-21 DIAGNOSIS — Z8619 Personal history of other infectious and parasitic diseases: Secondary | ICD-10-CM | POA: Insufficient documentation

## 2013-02-21 DIAGNOSIS — R55 Syncope and collapse: Secondary | ICD-10-CM | POA: Insufficient documentation

## 2013-02-21 DIAGNOSIS — Y9301 Activity, walking, marching and hiking: Secondary | ICD-10-CM | POA: Insufficient documentation

## 2013-02-21 DIAGNOSIS — Y92410 Unspecified street and highway as the place of occurrence of the external cause: Secondary | ICD-10-CM | POA: Insufficient documentation

## 2013-02-21 DIAGNOSIS — Z8719 Personal history of other diseases of the digestive system: Secondary | ICD-10-CM | POA: Insufficient documentation

## 2013-02-21 DIAGNOSIS — Z79899 Other long term (current) drug therapy: Secondary | ICD-10-CM | POA: Insufficient documentation

## 2013-02-21 DIAGNOSIS — F411 Generalized anxiety disorder: Secondary | ICD-10-CM | POA: Insufficient documentation

## 2013-02-21 DIAGNOSIS — T07XXXA Unspecified multiple injuries, initial encounter: Secondary | ICD-10-CM | POA: Insufficient documentation

## 2013-02-21 DIAGNOSIS — Z7982 Long term (current) use of aspirin: Secondary | ICD-10-CM | POA: Insufficient documentation

## 2013-02-21 DIAGNOSIS — W19XXXA Unspecified fall, initial encounter: Secondary | ICD-10-CM | POA: Insufficient documentation

## 2013-02-21 DIAGNOSIS — Z8673 Personal history of transient ischemic attack (TIA), and cerebral infarction without residual deficits: Secondary | ICD-10-CM | POA: Insufficient documentation

## 2013-02-21 HISTORY — DX: Cerebral infarction, unspecified: I63.9

## 2013-02-21 LAB — COMPREHENSIVE METABOLIC PANEL
ALT: 8 U/L (ref 0–35)
AST: 11 U/L (ref 0–37)
Albumin: 3.2 g/dL — ABNORMAL LOW (ref 3.5–5.2)
Alkaline Phosphatase: 77 U/L (ref 39–117)
BUN: 14 mg/dL (ref 6–23)
CO2: 29 mEq/L (ref 19–32)
Calcium: 8.4 mg/dL (ref 8.4–10.5)
Chloride: 104 mEq/L (ref 96–112)
Creatinine, Ser: 0.88 mg/dL (ref 0.50–1.10)
GFR calc Af Amer: 78 mL/min — ABNORMAL LOW (ref >90)
GFR calc non Af Amer: 67 mL/min — ABNORMAL LOW (ref >90)
Glucose, Bld: 86 mg/dL (ref 70–99)
Potassium: 3.1 mEq/L — ABNORMAL LOW (ref 3.5–5.1)
Sodium: 139 mEq/L (ref 135–145)
Total Bilirubin: 0.2 mg/dL — ABNORMAL LOW (ref 0.3–1.2)
Total Protein: 6.2 g/dL (ref 6.0–8.3)

## 2013-02-21 LAB — CBC WITH DIFFERENTIAL/PLATELET
Basophils Absolute: 0 10*3/uL (ref 0.0–0.1)
Basophils Relative: 0 % (ref 0–1)
Eosinophils Absolute: 0.2 10*3/uL (ref 0.0–0.7)
Eosinophils Relative: 2 % (ref 0–5)
HCT: 35.6 % — ABNORMAL LOW (ref 36.0–46.0)
Hemoglobin: 11.8 g/dL — ABNORMAL LOW (ref 12.0–15.0)
Lymphocytes Relative: 28 % (ref 12–46)
Lymphs Abs: 1.7 10*3/uL (ref 0.7–4.0)
MCH: 27.5 pg (ref 26.0–34.0)
MCHC: 33.1 g/dL (ref 30.0–36.0)
MCV: 83 fL (ref 78.0–100.0)
Monocytes Absolute: 0.3 10*3/uL (ref 0.1–1.0)
Monocytes Relative: 5 % (ref 3–12)
Neutro Abs: 4 10*3/uL (ref 1.7–7.7)
Neutrophils Relative %: 64 % (ref 43–77)
Platelets: 182 10*3/uL (ref 150–400)
RBC: 4.29 MIL/uL (ref 3.87–5.11)
RDW: 12.9 % (ref 11.5–15.5)
WBC: 6.2 10*3/uL (ref 4.0–10.5)

## 2013-02-21 NOTE — ED Notes (Signed)
Patient transported to CT 

## 2013-02-21 NOTE — ED Notes (Signed)
Was walking on the street became light-headed and fell. Has been out of her BP meds for a week. BP 148/86 HR 57 98% RA. Abrasion and contusion to right knee and wrist. No swelling or deformities.

## 2013-02-21 NOTE — ED Provider Notes (Signed)
History     CSN: [id number]  Arrival date & time 02/21/13  1459   First MD Initiated Contact with Patient 02/21/13 1517      Chief Complaint  Patient presents with  . Fall    HPI Pt was walking today collecting cans when she became lightheaded and fell.  She is not sure why she fell.  It lasted maybe a second or so.  She was able to ask her son for help to get up.  She just became lightheaded and does not recall falling.  She had been feeling fine other than a headache in the back of her head.  She has history of headaches.  This feels a little different though.  Mild to moderate but not very severe.  No vomiting.  No numbness or weakness.  She was able to walk after the event.  No fevers, no chest pain.  No blood in her stool.  She did sustain some minor abrasions after the fall.  Past Medical History  Diagnosis Date  . ABDOMINAL PAIN, RECURRENT 03/29/2008  . ANXIETY 10/07/2007  . Candidiasis of mouth 05/30/2010  . GERD 07/25/2007  . HYPERTENSION 07/25/2007  . Hernia, hiatal   . Stroke     Past Surgical History  Procedure Laterality Date  . Abdominal hysterectomy    . Knee arthroscopy    . Breast surgery      bx    Family History  Problem Relation Age of Onset  . Kidney disease Neg Hx   . Stroke Neg Hx   . Diabetes Neg Hx     History  Substance Use Topics  . Smoking status: Never Smoker   . Smokeless tobacco: Never Used  . Alcohol Use: Yes     Comment: occasional    OB History   Grav Para Term Preterm Abortions TAB SAB Ect Mult Living                  Review of Systems  All other systems reviewed and are negative.    Allergies  Codeine phosphate  Home Medications   Current Outpatient Rx  Name  Route  Sig  Dispense  Refill  . albuterol (PROVENTIL HFA;VENTOLIN HFA) 108 (90 BASE) MCG/ACT inhaler   Inhalation   Inhale 1-2 puffs into the lungs every 6 (six) hours as needed for wheezing.   1 Inhaler   0   . aspirin EC 81 MG tablet   Oral   Take 81 mg  by mouth daily.         . clonazePAM (KLONOPIN) 1 MG tablet      TAKE 1 TABLET BY MOUTH TWICE A DAY AS NEEDED   60 tablet   2   . FLUoxetine (PROZAC) 20 MG capsule      TAKE 1 CAPSULE BY MOUTH EVERY DAY   90 capsule   3   . hydrochlorothiazide (HYDRODIURIL) 25 MG tablet   Oral   Take 1 tablet (25 mg total) by mouth daily.   90 tablet   4     BP 138/77  Pulse 52  Temp(Src) 98.3 F (36.8 C) (Oral)  Resp 16  SpO2 99%  Physical Exam  Nursing note and vitals reviewed. Constitutional: She appears well-developed and well-nourished. No distress.  HENT:  Head: Normocephalic and atraumatic.  Right Ear: External ear normal.  Left Ear: External ear normal.  Eyes: Conjunctivae are normal. Right eye exhibits no discharge. Left eye exhibits no discharge. No scleral icterus.  Neck: Neck supple. No tracheal deviation present.  Cardiovascular: Normal rate, regular rhythm and intact distal pulses.   Pulmonary/Chest: Effort normal and breath sounds normal. No stridor. No respiratory distress. She has no wheezes. She has no rales.  Abdominal: Soft. Bowel sounds are normal. She exhibits no distension. There is no tenderness. There is no rebound and no guarding.  Musculoskeletal: She exhibits no edema and no tenderness.  Minor abrasions and contusion bilateral knees, right elbow and right wrist, full rom, no ttp, no edema  Neurological: She is alert. She has normal strength. She displays no tremor. No cranial nerve deficit ( no gross defecits noted) or sensory deficit. She exhibits normal muscle tone. She displays no seizure activity. Coordination and gait normal.  Skin: Skin is warm and dry. No rash noted.  Psychiatric: She has a normal mood and affect.    ED Course  Procedures (including critical care time)  Rate: 52  Rhythm: normal sinus rhythm  QRS Axis: normal  Intervals: normal  ST/T Wave abnormalities: normal  Conduction Disutrbances:none  Narrative Interpretation:   Old  EKG Reviewed: none available  Labs Reviewed  CBC WITH DIFFERENTIAL - Abnormal; Notable for the following:    Hemoglobin 11.8 (*)    HCT 35.6 (*)    All other components within normal limits  COMPREHENSIVE METABOLIC PANEL - Abnormal; Notable for the following:    Potassium 3.1 (*)    Albumin 3.2 (*)    Total Bilirubin 0.2 (*)    GFR calc non Af Amer 67 (*)    GFR calc Af Amer 78 (*)    All other components within normal limits   Ct Head Wo Contrast  02/21/2013  *RADIOLOGY REPORT*  Clinical Data: Lightheadedness, fall.  CT HEAD WITHOUT CONTRAST  Technique:  Contiguous axial images were obtained from the base of the skull through the vertex without contrast.  Comparison: MRI of July 01, 2005.  Findings: Bony calvarium appears intact.  No mass effect or midline shift is noted.  Ventricular size is within normal limits.  Minimal chronic ischemic white matter disease is noted.  There is no evidence of mass lesion, hemorrhage or acute infarction.  IMPRESSION: No acute intracranial abnormality seen.   Original Report Authenticated By: Lupita Raider.,  M.D.      1. Syncope   2. Contusion       MDM  Patient had a fall this morning. She is not certain why she fell. Symptoms are not entirely consistent with syncope. She describes a loss of consciousness but maybe 1 second. It was raining outside and not certain if there is a possibility she could have fallen and hit her head and had a mild concussion.  The patient did not have a significant loss of consciousness. Her exam here is unremarkable. She is able to walk without any difficulty. Her neurologic exam is normal. At this time I have a low suspicion for TIA/stroke.  Patient's EKG is reassuring. She does not have history of congestive heart failure. I doubt acute cardiac dysrhythmia. Patient was concerned because she ran out of her blood pressure medications. She is having some financial issues.  Patient does have a prescription and will fill it  is and she has the money.  Regarding her injuries, the patient appears to have mild contusions but there is no significant tenderness or deformity to suggest fracture.  At this time there does not appear to be any evidence of an acute emergency medical condition and the patient  appears stable for discharge with appropriate outpatient follow up.        Celene Kras, MD 02/21/13 [phone]

## 2013-02-21 NOTE — ED Notes (Signed)
[id number]<JY> Expected date:<BR> Expected time:<BR> Means of arrival:<BR> Comments:<BR> 67 y/o F LOC, fall, no blood thinners

## 2013-04-27 ENCOUNTER — Telehealth: Payer: Self-pay | Admitting: Internal Medicine

## 2013-04-27 NOTE — Telephone Encounter (Signed)
Patient Information:  Caller Name: Niccole  Phone: [phone]  Patient: Erica Morris  Gender: Female  DOB: 11-21-1946  Age: 67 Years  PCP: Eleonore Chiquito [facility])  Office Follow Up:  Does the office need to follow up with this patient?: No  Instructions For The Office: N/A  RN Note:  Intermittent right facial tingling for past 3-4 weeks and fatigue for past several months.  Tingling not present now.  Facial numbness and tingling often present when awakens for several minutes.  Last episode was this afternoon and lasted about for 2-5 seconds. Posterior neck pain present when turns head. No appointments remain in office so advised to go to [facility] now.  Symptoms  Reason For Call & Symptoms: Emergent call:  Reports fatigue with increased sleeping for past 2-3 months and intermittent tingling of right of face for past 3-4 weeks.  Reviewed Health History In EMR: Yes  Reviewed Medications In EMR: Yes  Reviewed Allergies In EMR: No  Reviewed Surgeries / Procedures: Yes  Date of Onset of Symptoms: 04/06/2013  Guideline(s) Used:  Neurologic Deficit  Disposition Per Guideline:   See Today in Office  Reason For Disposition Reached:   Neck pain (with neurologic deficit)  Advice Given:  Call Back If:  You become worse.  RN Overrode Recommendation:  Go To [facility]  No appointments remain for 04/27/13.

## 2013-04-27 NOTE — Telephone Encounter (Signed)
Noted  

## 2013-04-28 ENCOUNTER — Encounter (HOSPITAL_COMMUNITY): Payer: Self-pay | Admitting: Emergency Medicine

## 2013-04-28 ENCOUNTER — Emergency Department (HOSPITAL_COMMUNITY)
Admission: EM | Admit: 2013-04-28 | Discharge: 2013-04-28 | Disposition: A | Discharge status: Home / Self Care | Payer: Medicare HMO | Attending: Emergency Medicine | Admitting: Emergency Medicine

## 2013-04-28 DIAGNOSIS — F411 Generalized anxiety disorder: Secondary | ICD-10-CM | POA: Insufficient documentation

## 2013-04-28 DIAGNOSIS — Z8619 Personal history of other infectious and parasitic diseases: Secondary | ICD-10-CM | POA: Insufficient documentation

## 2013-04-28 DIAGNOSIS — Z8719 Personal history of other diseases of the digestive system: Secondary | ICD-10-CM | POA: Insufficient documentation

## 2013-04-28 DIAGNOSIS — R519 Headache, unspecified: Secondary | ICD-10-CM

## 2013-04-28 DIAGNOSIS — Z8673 Personal history of transient ischemic attack (TIA), and cerebral infarction without residual deficits: Secondary | ICD-10-CM | POA: Insufficient documentation

## 2013-04-28 DIAGNOSIS — R51 Headache: Secondary | ICD-10-CM | POA: Insufficient documentation

## 2013-04-28 DIAGNOSIS — I10 Essential (primary) hypertension: Secondary | ICD-10-CM | POA: Insufficient documentation

## 2013-04-28 DIAGNOSIS — Z7982 Long term (current) use of aspirin: Secondary | ICD-10-CM | POA: Insufficient documentation

## 2013-04-28 DIAGNOSIS — Z79899 Other long term (current) drug therapy: Secondary | ICD-10-CM | POA: Insufficient documentation

## 2013-04-28 LAB — CBC
HCT: 37.5 % (ref 36.0–46.0)
Hemoglobin: 12.6 g/dL (ref 12.0–15.0)
MCH: 27.2 pg (ref 26.0–34.0)
MCHC: 33.6 g/dL (ref 30.0–36.0)
MCV: 81 fL (ref 78.0–100.0)
Platelets: 201 10*3/uL (ref 150–400)
RBC: 4.63 MIL/uL (ref 3.87–5.11)
RDW: 12.4 % (ref 11.5–15.5)
WBC: 5.4 10*3/uL (ref 4.0–10.5)

## 2013-04-28 LAB — BASIC METABOLIC PANEL
BUN: 15 mg/dL (ref 6–23)
CO2: 32 mEq/L (ref 19–32)
Calcium: 9.5 mg/dL (ref 8.4–10.5)
Chloride: 99 mEq/L (ref 96–112)
Creatinine, Ser: 0.89 mg/dL (ref 0.50–1.10)
GFR calc Af Amer: 77 mL/min — ABNORMAL LOW (ref >90)
GFR calc non Af Amer: 67 mL/min — ABNORMAL LOW (ref >90)
Glucose, Bld: 109 mg/dL — ABNORMAL HIGH (ref 70–99)
Potassium: 3.3 mEq/L — ABNORMAL LOW (ref 3.5–5.1)
Sodium: 140 mEq/L (ref 135–145)

## 2013-04-28 LAB — TROPONIN I: Troponin I: <0.3 ng/mL (ref <0.30)

## 2013-04-28 LAB — URINALYSIS, ROUTINE W REFLEX MICROSCOPIC
Bilirubin Urine: NEGATIVE
Glucose, UA: NEGATIVE mg/dL
Hgb urine dipstick: NEGATIVE
Ketones, ur: NEGATIVE mg/dL
Leukocytes, UA: NEGATIVE
Nitrite: NEGATIVE
Protein, ur: NEGATIVE mg/dL
Specific Gravity, Urine: 1.021 (ref 1.005–1.030)
Urobilinogen, UA: 0.2 mg/dL (ref 0.0–1.0)
pH: 7.5 (ref 5.0–8.0)

## 2013-04-28 NOTE — ED Notes (Signed)
Pt states that she had has a tia in the past and that for the past 2 weeks now that she has intermit tingling to her face, states that she also has some abd pain, multiple compliants. States that her mother and husband past recently of dementia and it is scaring her. Alert to person and place, no slurred speech, pt also c/o hx of anxiety

## 2013-04-28 NOTE — ED Provider Notes (Signed)
History     CSN: [id number]  Arrival date & time 04/28/13  1232   First MD Initiated Contact with Patient 04/28/13 1256      Chief Complaint  Patient presents with  . Abdominal Pain    (Consider location/radiation/quality/duration/timing/severity/associated sxs/prior treatment) HPI Comments: 67 year old female with a history of anxiety and hypertension who presents with a complaint of pins and needles feeling tight pain to her right cheek. She denies any complaints of abdominal pain to me. This is located over her right mandible, intermittent, nothing seems to make it better or worse, it comes on spontaneously and is not associated with any numbness or weakness of the arms or legs, difficulty walking, loss of consciousness, headache, difficulty chewing, difficulty speaking or difficulty swallowing. She has no chest pain, no shortness of breath, symptoms are mild at this time.  The history is provided by the patient.    Past Medical History  Diagnosis Date  . ABDOMINAL PAIN, RECURRENT 03/29/2008  . ANXIETY 10/07/2007  . Candidiasis of mouth 05/30/2010  . GERD 07/25/2007  . HYPERTENSION 07/25/2007  . Hernia, hiatal   . Stroke     Past Surgical History  Procedure Laterality Date  . Abdominal hysterectomy    . Knee arthroscopy    . Breast surgery      bx    Family History  Problem Relation Age of Onset  . Kidney disease Neg Hx   . Stroke Neg Hx   . Diabetes Neg Hx     History  Substance Use Topics  . Smoking status: Never Smoker   . Smokeless tobacco: Never Used  . Alcohol Use: Yes     Comment: occasional    OB History   Grav Para Term Preterm Abortions TAB SAB Ect Mult Living                  Review of Systems  All other systems reviewed and are negative.    Allergies  Codeine phosphate  Home Medications   Current Outpatient Rx  Name  Route  Sig  Dispense  Refill  . acetaminophen (TYLENOL) 500 MG tablet   Oral   Take 1,000 mg by mouth every 6 (six)  hours as needed for pain.         Marland Kitchen aspirin EC 81 MG tablet   Oral   Take 81 mg by mouth daily.         . clonazePAM (KLONOPIN) 1 MG tablet   Oral   Take 1 mg by mouth 2 (two) times daily as needed for anxiety.         Marland Kitchen FLUoxetine (PROZAC) 20 MG capsule   Oral   Take 20 mg by mouth daily.         . hydrochlorothiazide (HYDRODIURIL) 25 MG tablet   Oral   Take 25 mg by mouth daily.         Marland Kitchen albuterol (PROVENTIL HFA;VENTOLIN HFA) 108 (90 BASE) MCG/ACT inhaler   Inhalation   Inhale 1-2 puffs into the lungs every 6 (six) hours as needed for wheezing.   1 Inhaler   0     BP 161/71  Pulse 52  Temp(Src) 98.4 F (36.9 C)  Resp 16  SpO2 98%  Physical Exam  Nursing note and vitals reviewed. Constitutional: She appears well-developed and well-nourished. No distress.  HENT:  Head: Normocephalic and atraumatic.  Mouth/Throat: Oropharynx is clear and moist. No oropharyngeal exudate.  No asymmetry to the jaw, no tenderness over  the jaw line, no tenderness over the gums, the patient has no teeth. Tympanic membranes appear normal bilaterally  Eyes: Conjunctivae and EOM are normal. Pupils are equal, round, and reactive to light. Right eye exhibits no discharge. Left eye exhibits no discharge. No scleral icterus.  Neck: Normal range of motion. Neck supple. No JVD present. No thyromegaly present.  Cardiovascular: Normal rate, regular rhythm, normal heart sounds and intact distal pulses.  Exam reveals no gallop and no friction rub.   No murmur heard. Pulmonary/Chest: Effort normal and breath sounds normal. No respiratory distress. She has no wheezes. She has no rales.  Abdominal: Soft. Bowel sounds are normal. She exhibits no distension and no mass. There is no tenderness.  Musculoskeletal: Normal range of motion. She exhibits no edema and no tenderness.  Lymphadenopathy:    She has no cervical adenopathy.  Neurological: She is alert. Coordination normal.  Normal sensation to  light touch and pinprick over the face, bilateral upper and lower extremities. Normal cranial nerves III through XII, normal strength of the bilateral upper and lower extremities, normal coordination, normal speech.  Skin: Skin is warm and dry. No rash noted. No erythema.  Psychiatric: She has a normal mood and affect. Her behavior is normal.    ED Course  Procedures (including critical care time)  Labs Reviewed  BASIC METABOLIC PANEL - Abnormal; Notable for the following:    Potassium 3.3 (*)    Glucose, Bld 109 (*)    GFR calc non Af Amer 67 (*)    GFR calc Af Amer 77 (*)    All other components within normal limits  URINALYSIS, ROUTINE W REFLEX MICROSCOPIC  CBC  TROPONIN I   No results found.   1. Facial pain       MDM  Overall the patient is a well-appearing, she has a sinus bradycardia with a heart rate of 56 beats per minute, no arrhythmia, no ischemia. I do not have any other obvious explanation for the patient's pins and needles feeling which is intermittent and not associated with any other neurologic symptoms. She has no objective findings on her exam and states that she has a total symmetrical objective neurologic exam of her face. Check basic labs, troponin, EKG, anticipate discharge. The patient states that she does have a family physician with whom she can followup.  ED ECG REPORT  I personally interpreted this EKG   Date: 04/28/2013   Rate: 56  Rhythm: sinus bradycardia  QRS Axis: normal  Intervals: normal  ST/T Wave abnormalities: normal  Conduction Disutrbances:none  Narrative Interpretation:   Old EKG Reviewed: The 25th 2014, no significant changes, rate is slightly faster today   Labs normal - no signs of significant findings - pt informed of results - no other signs of odontogenic / ENT infection or stroke.  Meds given in ED:  Medications - No data to display  New Prescriptions   No medications on file          Vida Roller, MD 04/28/13  1431

## 2013-05-01 ENCOUNTER — Other Ambulatory Visit: Payer: Self-pay | Admitting: Internal Medicine

## 2013-05-03 NOTE — Telephone Encounter (Signed)
Pt needs refills omeprazole,fluoxetine and clonazepam call into [facility] [address]

## 2013-05-18 ENCOUNTER — Ambulatory Visit: Payer: Medicare HMO | Admitting: Internal Medicine

## 2013-05-28 ENCOUNTER — Other Ambulatory Visit: Payer: Self-pay | Admitting: Internal Medicine

## 2013-05-30 ENCOUNTER — Other Ambulatory Visit: Payer: Self-pay | Admitting: *Deleted

## 2013-05-30 MED ORDER — CLONAZEPAM 1 MG PO TABS: ORAL_TABLET | ORAL | Status: DC

## 2013-06-03 ENCOUNTER — Emergency Department (HOSPITAL_COMMUNITY): Payer: Medicare HMO

## 2013-06-03 ENCOUNTER — Encounter (HOSPITAL_COMMUNITY): Payer: Self-pay

## 2013-06-03 ENCOUNTER — Emergency Department (HOSPITAL_COMMUNITY)
Admission: EM | Admit: 2013-06-03 | Discharge: 2013-06-03 | Disposition: A | Discharge status: Home / Self Care | Payer: Medicare HMO | Attending: Emergency Medicine | Admitting: Emergency Medicine

## 2013-06-03 DIAGNOSIS — Z8619 Personal history of other infectious and parasitic diseases: Secondary | ICD-10-CM | POA: Insufficient documentation

## 2013-06-03 DIAGNOSIS — Z7982 Long term (current) use of aspirin: Secondary | ICD-10-CM | POA: Insufficient documentation

## 2013-06-03 DIAGNOSIS — F411 Generalized anxiety disorder: Secondary | ICD-10-CM | POA: Insufficient documentation

## 2013-06-03 DIAGNOSIS — Z90710 Acquired absence of both cervix and uterus: Secondary | ICD-10-CM | POA: Insufficient documentation

## 2013-06-03 DIAGNOSIS — Z79899 Other long term (current) drug therapy: Secondary | ICD-10-CM | POA: Insufficient documentation

## 2013-06-03 DIAGNOSIS — R110 Nausea: Secondary | ICD-10-CM | POA: Insufficient documentation

## 2013-06-03 DIAGNOSIS — I10 Essential (primary) hypertension: Secondary | ICD-10-CM | POA: Insufficient documentation

## 2013-06-03 DIAGNOSIS — Z8719 Personal history of other diseases of the digestive system: Secondary | ICD-10-CM | POA: Insufficient documentation

## 2013-06-03 DIAGNOSIS — K5900 Constipation, unspecified: Secondary | ICD-10-CM | POA: Insufficient documentation

## 2013-06-03 DIAGNOSIS — Z8673 Personal history of transient ischemic attack (TIA), and cerebral infarction without residual deficits: Secondary | ICD-10-CM | POA: Insufficient documentation

## 2013-06-03 MED ORDER — POLYETHYLENE GLYCOL 3350 17 G PO PACK: 17.0000 g | PACK | Freq: Two times a day (BID) | ORAL | Status: DC

## 2013-06-03 MED ORDER — MAGNESIUM CITRATE PO SOLN: 296.0000 mL | Freq: Once | ORAL | Status: DC

## 2013-06-03 NOTE — ED Notes (Addendum)
Patient reports that she is constipated and despite taking laxatives she has to "help it out" by sticking her fingers into her rectum and "dragging" stool out. Patient reports abdominal pain secondary to constipation, patient reports passing large amounts of flatus.

## 2013-06-03 NOTE — ED Notes (Signed)
Called into pt's room, pt is requesting to be seen by doctor immediately, once again delay explained to pt however pt sts "if doctor is not here by 3:00 I'm walking out". Reassured pt that we will do everything we can to expedite the care. Pt voiced understanding.

## 2013-06-03 NOTE — ED Notes (Signed)
She c/o constipation, plus increased pain at known hernia site upper mid abd. X 2 weeks.

## 2013-06-03 NOTE — ED Provider Notes (Signed)
History    CSN: [id number] Arrival date & time 06/03/13  1215  First MD Initiated Contact with Patient 06/03/13 1456     Chief Complaint  Patient presents with  . Abdominal Pain    Also c/o constipation and has known abd. hernia   (Consider location/radiation/quality/duration/timing/severity/associated sxs/prior Treatment) Patient is a 67 y.o. female presenting with abdominal pain. The history is provided by the patient.  Abdominal Pain Associated symptoms include abdominal pain. Pertinent negatives include no chest pain, no headaches and no shortness of breath.   patient presents with some abdominal pain and constipation. States she's not had a bowel movement in 2 weeks. She states it was because she put her finger on there and pulled some out. She states no relief with laxatives. No fevers. She states she is passing gas. Occasional nausea. She states her abdomen was not any larger. She states she has pain from her hernia in her upper abdomen. C-sections were only abdominal surgery.  Past Medical History  Diagnosis Date  . ABDOMINAL PAIN, RECURRENT 03/29/2008  . ANXIETY 10/07/2007  . Candidiasis of mouth 05/30/2010  . GERD 07/25/2007  . HYPERTENSION 07/25/2007  . Hernia, hiatal   . Stroke    Past Surgical History  Procedure Laterality Date  . Abdominal hysterectomy    . Knee arthroscopy    . Breast surgery      bx   Family History  Problem Relation Age of Onset  . Kidney disease Neg Hx   . Stroke Neg Hx   . Diabetes Neg Hx    History  Substance Use Topics  . Smoking status: Never Smoker   . Smokeless tobacco: Never Used  . Alcohol Use: Yes     Comment: occasional   OB History   Grav Para Term Preterm Abortions TAB SAB Ect Mult Living                 Review of Systems  Constitutional: Negative for activity change and appetite change.  HENT: Negative for neck stiffness.   Eyes: Negative for pain.  Respiratory: Negative for chest tightness and shortness of breath.    Cardiovascular: Negative for chest pain and leg swelling.  Gastrointestinal: Positive for abdominal pain and constipation. Negative for nausea, vomiting and diarrhea.  Genitourinary: Negative for flank pain.  Musculoskeletal: Negative for back pain.  Skin: Negative for rash.  Neurological: Negative for weakness, numbness and headaches.  Psychiatric/Behavioral: Negative for behavioral problems.    Allergies  Codeine phosphate  Home Medications   Current Outpatient Rx  Name  Route  Sig  Dispense  Refill  . acetaminophen (TYLENOL) 500 MG tablet   Oral   Take 1,000 mg by mouth every 6 (six) hours as needed for pain.         Marland Kitchen albuterol (PROVENTIL HFA;VENTOLIN HFA) 108 (90 BASE) MCG/ACT inhaler   Inhalation   Inhale 1-2 puffs into the lungs every 6 (six) hours as needed for wheezing.   1 Inhaler   0   . aspirin EC 81 MG tablet   Oral   Take 81 mg by mouth daily.         . bisacodyl (WOMENS LAXATIVE) 5 MG EC tablet   Oral   Take 5 mg by mouth daily as needed for constipation.         . clonazePAM (KLONOPIN) 1 MG tablet      TAKE 1 TABLET BY MOUTH TWICE A DAY AS NEEDED   60 tablet  2   . FLUoxetine (PROZAC) 20 MG capsule   Oral   Take 20 mg by mouth daily.         . hydrochlorothiazide (HYDRODIURIL) 25 MG tablet   Oral   Take 25 mg by mouth daily.         . magnesium citrate solution   Oral   Take 296 mLs by mouth once.   300 mL   0   . polyethylene glycol (MIRALAX / GLYCOLAX) packet   Oral   Take 17 g by mouth 2 (two) times daily.   14 each   0    BP 141/79  Pulse 55  Temp(Src) 98 F (36.7 C) (Oral)  Resp 16  Ht 5\' 4"  (1.626 m)  Wt 180 lb (81.647 kg)  BMI 30.88 kg/m2  SpO2 97% Physical Exam  Nursing note and vitals reviewed. Constitutional: She is oriented to person, place, and time. She appears well-developed and well-nourished.  HENT:  Head: Normocephalic and atraumatic.  Eyes: EOM are normal. Pupils are equal, round, and reactive to  light.  Neck: Normal range of motion. Neck supple.  Cardiovascular: Normal rate, regular rhythm and normal heart sounds.   No murmur heard. Pulmonary/Chest: Effort normal and breath sounds normal. No respiratory distress. She has no wheezes. She has no rales.  Abdominal: Soft. Bowel sounds are normal. She exhibits no distension. There is tenderness. There is no rebound and no guarding.  Mild mid to left-sided abdomen tenderness. No rebound guarding. No mass.  Musculoskeletal: Normal range of motion.  Neurological: She is alert and oriented to person, place, and time. No cranial nerve deficit.  Skin: Skin is warm and dry.  Psychiatric: She has a normal mood and affect. Her speech is normal.    ED Course  Procedures (including critical care time) Labs Reviewed - No data to display Dg Abd 2 Views  06/03/2013   *RADIOLOGY REPORT*  Clinical Data: Lower abdominal pain.  Constipation.  ABDOMEN - 2 VIEW  Comparison: 03/27/2008 CT scan  Findings: No significant abnormal air-fluid levels or findings free intraperitoneal gas beneath the hemidiaphragms.  Prominence of stool throughout the colon suggests constipation.  No dilated small bowel observed.  IMPRESSION:  1. Prominence of stool throughout the colon suggests constipation.   Original Report Authenticated By: Gaylyn Rong, M.D.   1. Constipation     MDM  Patient with constipation. X-ray shows constipation. Rectal exam does not show infection. Will DC with bowel protocol  Juliet Rude. Rubin Payor, MD 06/03/13 2159

## 2013-06-19 ENCOUNTER — Emergency Department (HOSPITAL_COMMUNITY): Payer: Medicare HMO

## 2013-06-19 ENCOUNTER — Emergency Department (HOSPITAL_COMMUNITY)
Admission: EM | Admit: 2013-06-19 | Discharge: 2013-06-19 | Disposition: A | Discharge status: Home / Self Care | Payer: Medicare HMO | Attending: Emergency Medicine | Admitting: Emergency Medicine

## 2013-06-19 ENCOUNTER — Encounter (HOSPITAL_COMMUNITY): Payer: Self-pay | Admitting: Emergency Medicine

## 2013-06-19 DIAGNOSIS — Z79899 Other long term (current) drug therapy: Secondary | ICD-10-CM | POA: Insufficient documentation

## 2013-06-19 DIAGNOSIS — R5383 Other fatigue: Secondary | ICD-10-CM | POA: Insufficient documentation

## 2013-06-19 DIAGNOSIS — Z8673 Personal history of transient ischemic attack (TIA), and cerebral infarction without residual deficits: Secondary | ICD-10-CM | POA: Insufficient documentation

## 2013-06-19 DIAGNOSIS — R5381 Other malaise: Secondary | ICD-10-CM | POA: Insufficient documentation

## 2013-06-19 DIAGNOSIS — F329 Major depressive disorder, single episode, unspecified: Secondary | ICD-10-CM | POA: Insufficient documentation

## 2013-06-19 DIAGNOSIS — F411 Generalized anxiety disorder: Secondary | ICD-10-CM | POA: Insufficient documentation

## 2013-06-19 DIAGNOSIS — I10 Essential (primary) hypertension: Secondary | ICD-10-CM | POA: Insufficient documentation

## 2013-06-19 DIAGNOSIS — F3289 Other specified depressive episodes: Secondary | ICD-10-CM | POA: Insufficient documentation

## 2013-06-19 DIAGNOSIS — Z8719 Personal history of other diseases of the digestive system: Secondary | ICD-10-CM | POA: Insufficient documentation

## 2013-06-19 DIAGNOSIS — F32A Depression, unspecified: Secondary | ICD-10-CM

## 2013-06-19 DIAGNOSIS — Z8619 Personal history of other infectious and parasitic diseases: Secondary | ICD-10-CM | POA: Insufficient documentation

## 2013-06-19 LAB — COMPREHENSIVE METABOLIC PANEL
ALT: 10 U/L (ref 0–35)
AST: 12 U/L (ref 0–37)
Albumin: 3.5 g/dL (ref 3.5–5.2)
Alkaline Phosphatase: 78 U/L (ref 39–117)
BUN: 18 mg/dL (ref 6–23)
CO2: 33 mEq/L — ABNORMAL HIGH (ref 19–32)
Calcium: 9.6 mg/dL (ref 8.4–10.5)
Chloride: 99 mEq/L (ref 96–112)
Creatinine, Ser: 0.98 mg/dL (ref 0.50–1.10)
GFR calc Af Amer: 69 mL/min — ABNORMAL LOW (ref >90)
GFR calc non Af Amer: 59 mL/min — ABNORMAL LOW (ref >90)
Glucose, Bld: 101 mg/dL — ABNORMAL HIGH (ref 70–99)
Potassium: 3.2 mEq/L — ABNORMAL LOW (ref 3.5–5.1)
Sodium: 139 mEq/L (ref 135–145)
Total Bilirubin: 0.4 mg/dL (ref 0.3–1.2)
Total Protein: 6.3 g/dL (ref 6.0–8.3)

## 2013-06-19 LAB — URINALYSIS, ROUTINE W REFLEX MICROSCOPIC
Bilirubin Urine: NEGATIVE
Glucose, UA: NEGATIVE mg/dL
Hgb urine dipstick: NEGATIVE
Ketones, ur: NEGATIVE mg/dL
Nitrite: NEGATIVE
Protein, ur: NEGATIVE mg/dL
Specific Gravity, Urine: 1.024 (ref 1.005–1.030)
Urobilinogen, UA: 0.2 mg/dL (ref 0.0–1.0)
pH: 7 (ref 5.0–8.0)

## 2013-06-19 LAB — CBC WITH DIFFERENTIAL/PLATELET
Basophils Absolute: 0 10*3/uL (ref 0.0–0.1)
Basophils Relative: 0 % (ref 0–1)
Eosinophils Absolute: 0.2 10*3/uL (ref 0.0–0.7)
Eosinophils Relative: 3 % (ref 0–5)
HCT: 36.7 % (ref 36.0–46.0)
Hemoglobin: 12.1 g/dL (ref 12.0–15.0)
Lymphocytes Relative: 30 % (ref 12–46)
Lymphs Abs: 1.7 10*3/uL (ref 0.7–4.0)
MCH: 27 pg (ref 26.0–34.0)
MCHC: 33 g/dL (ref 30.0–36.0)
MCV: 81.9 fL (ref 78.0–100.0)
Monocytes Absolute: 0.3 10*3/uL (ref 0.1–1.0)
Monocytes Relative: 5 % (ref 3–12)
Neutro Abs: 3.5 10*3/uL (ref 1.7–7.7)
Neutrophils Relative %: 62 % (ref 43–77)
Platelets: 183 10*3/uL (ref 150–400)
RBC: 4.48 MIL/uL (ref 3.87–5.11)
RDW: 13.1 % (ref 11.5–15.5)
WBC: 5.7 10*3/uL (ref 4.0–10.5)

## 2013-06-19 LAB — URINE MICROSCOPIC-ADD ON

## 2013-06-19 MED ORDER — SODIUM CHLORIDE 0.9 % IV BOLUS (SEPSIS)
1000.0000 mL | Freq: Once | INTRAVENOUS | Status: AC
  Administered 2013-06-19: 1000 mL via INTRAVENOUS

## 2013-06-19 NOTE — ED Notes (Signed)
Pt presenting to ed with multiple c/o "I lost my husband a couple months ago and then I lost my mother and I"m eating like a bird" pt states I am sleeping a lot I don't want to do anything. Pt states I'm also having severe heartburn and I'm out of my medications. Pt states she also has burning with urination. Pt denies SI/HI at this time. Pt also with c/o dizziness. Pt denies chest pain, nausea and vomiting at this time.

## 2013-06-19 NOTE — ED Notes (Signed)
I&O cath not done.  Needed to be cancelled documented in error

## 2013-06-19 NOTE — ED Provider Notes (Signed)
History    CSN: [id number] Arrival date & time 06/19/13  1312  First MD Initiated Contact with Patient 06/19/13 1332     Chief Complaint  Patient presents with  . Medical Clearance   (Consider location/radiation/quality/duration/timing/severity/associated sxs/prior Treatment) Patient is a 67 y.o. female presenting with weakness. The history is provided by the patient (the pt complains of weakness and sleeping alot).  Weakness This is a new problem. The current episode started more than 1 week ago. The problem occurs constantly. The problem has not changed since onset.Pertinent negatives include no chest pain, no abdominal pain and no headaches. Nothing aggravates the symptoms. Nothing relieves the symptoms.   Past Medical History  Diagnosis Date  . ABDOMINAL PAIN, RECURRENT 03/29/2008  . ANXIETY 10/07/2007  . Candidiasis of mouth 05/30/2010  . GERD 07/25/2007  . HYPERTENSION 07/25/2007  . Hernia, hiatal   . Stroke    Past Surgical History  Procedure Laterality Date  . Abdominal hysterectomy    . Knee arthroscopy    . Breast surgery      bx   Family History  Problem Relation Age of Onset  . Kidney disease Neg Hx   . Stroke Neg Hx   . Diabetes Neg Hx    History  Substance Use Topics  . Smoking status: Never Smoker   . Smokeless tobacco: Never Used  . Alcohol Use: Yes     Comment: occasional   OB History   Grav Para Term Preterm Abortions TAB SAB Ect Mult Living                 Review of Systems  Constitutional: Negative for appetite change and fatigue.  HENT: Negative for congestion, sinus pressure and ear discharge.   Eyes: Negative for discharge.  Respiratory: Negative for cough.   Cardiovascular: Negative for chest pain.  Gastrointestinal: Negative for abdominal pain and diarrhea.  Genitourinary: Negative for frequency and hematuria.  Musculoskeletal: Negative for back pain.  Skin: Negative for rash.  Neurological: Positive for weakness. Negative for seizures  and headaches.  Psychiatric/Behavioral: Negative for hallucinations.    Allergies  Codeine phosphate  Home Medications   Current Outpatient Rx  Name  Route  Sig  Dispense  Refill  . aspirin EC 81 MG tablet   Oral   Take 81 mg by mouth every morning.          . bisacodyl (WOMENS LAXATIVE) 5 MG EC tablet   Oral   Take 5 mg by mouth daily as needed for constipation.         . clonazePAM (KLONOPIN) 1 MG tablet   Oral   Take 1 mg by mouth 2 (two) times daily as needed for anxiety.         Marland Kitchen FLUoxetine (PROZAC) 20 MG capsule   Oral   Take 20 mg by mouth daily.         . hydrochlorothiazide (HYDRODIURIL) 25 MG tablet   Oral   Take 25 mg by mouth daily.         Marland Kitchen ibuprofen (ADVIL,MOTRIN) 200 MG tablet   Oral   Take 200 mg by mouth every 6 (six) hours as needed for pain.          BP 137/75  Pulse 61  Temp(Src) 98.4 F (36.9 C) (Oral)  Resp 20  SpO2 98% Physical Exam  Constitutional: She is oriented to person, place, and time. She appears well-developed.  HENT:  Head: Normocephalic.  Eyes: Conjunctivae and EOM  are normal. No scleral icterus.  Neck: Neck supple. No thyromegaly present.  Cardiovascular: Normal rate and regular rhythm.  Exam reveals no gallop and no friction rub.   No murmur heard. Pulmonary/Chest: No stridor. She has no wheezes. She has no rales. She exhibits no tenderness.  Abdominal: She exhibits no distension. There is no tenderness. There is no rebound.  Musculoskeletal: Normal range of motion. She exhibits no edema.  Lymphadenopathy:    She has no cervical adenopathy.  Neurological: She is oriented to person, place, and time. Coordination normal.  Skin: No rash noted. No erythema.  Psychiatric: She has a normal mood and affect. Her behavior is normal.  Not suicidal or homicidal    ED Course  Procedures (including critical care time) Labs Reviewed  COMPREHENSIVE METABOLIC PANEL - Abnormal; Notable for the following:    Potassium  3.2 (*)    CO2 33 (*)    Glucose, Bld 101 (*)    GFR calc non Af Amer 59 (*)    GFR calc Af Amer 69 (*)    All other components within normal limits  URINALYSIS, ROUTINE W REFLEX MICROSCOPIC - Abnormal; Notable for the following:    Leukocytes, UA TRACE (*)    All other components within normal limits  CBC WITH DIFFERENTIAL  URINE MICROSCOPIC-ADD ON   Dg Cervical Spine Complete  06/19/2013   *RADIOLOGY REPORT*  Clinical Data: Neck pain.  CERVICAL SPINE - COMPLETE 4+ VIEW  Comparison: No priors.  Findings: Multiple views of the cervical spine demonstrate no acute displaced fracture.  Alignment is anatomic.  Prevertebral soft tissues are normal.  There is mild multilevel degenerative disc disease, most severe at C5-C6 and C6-C7.  Mild multilevel facet arthropathy is noted.  Heterotopic ossification in the posterior soft tissues may relate to prior ligamentous injury.  IMPRESSION: 1.  No acute radiographic abnormality of the cervical spine. 2.  Mild multilevel degenerative disc disease and cervical spondylosis, as above.   Original Report Authenticated By: Trudie Reed, M.D.   Dg Abd Acute W/chest  06/19/2013   *RADIOLOGY REPORT*  Clinical Data: Abdominal pain.  ACUTE ABDOMEN SERIES (ABDOMEN 2 VIEW & CHEST 1 VIEW)  Comparison: 06/03/2013.  Findings: Lung volumes are normal.  No consolidative airspace disease.  No pleural effusions.  No pneumothorax.  No pulmonary nodule or mass noted.  Pulmonary vasculature and the cardiomediastinal silhouette are within normal limits. Atherosclerosis in the thoracic aorta.  Gas and stool are seen scattered throughout the colon extending to the level of the distal rectum.  No pathologic distension of small bowel is noted.  No gross evidence of pneumoperitoneum.  Numerous pelvic phleboliths are noted.  IMPRESSION: 1.  Nonobstructive bowel gas pattern. 2.  No pneumoperitoneum. 3.  No radiographic evidence of acute cardiopulmonary disease. 4.  Atherosclerosis.    Original Report Authenticated By: Trudie Reed, M.D.   1. Depression     MDM    Benny Lennert, MD 06/20/13 [phone]

## 2013-06-19 NOTE — ED Notes (Signed)
Pt reports dysuria and dark colored urine, have been feeling "bad" for the past 3 weeks.  Pt reports her husband passed away late last year.  Pt also reports h/a and neck pain when she lay around too long.

## 2013-07-28 ENCOUNTER — Other Ambulatory Visit: Payer: Self-pay | Admitting: Internal Medicine

## 2013-10-16 ENCOUNTER — Other Ambulatory Visit: Payer: Self-pay | Admitting: *Deleted

## 2013-10-16 MED ORDER — CLONAZEPAM 1 MG PO TABS: 1.0000 mg | ORAL_TABLET | Freq: Two times a day (BID) | ORAL | Status: DC | PRN

## 2013-10-16 MED ORDER — FLUOXETINE HCL 20 MG PO CAPS: 20.0000 mg | ORAL_CAPSULE | Freq: Every day | ORAL | Status: DC

## 2013-10-16 MED ORDER — HYDROCHLOROTHIAZIDE 25 MG PO TABS: 25.0000 mg | ORAL_TABLET | Freq: Every day | ORAL | Status: DC

## 2013-10-16 NOTE — Telephone Encounter (Signed)
Rx for Clonazepam faxed to [facility].

## 2013-12-02 ENCOUNTER — Ambulatory Visit (INDEPENDENT_AMBULATORY_CARE_PROVIDER_SITE_OTHER): Payer: Medicare HMO | Admitting: Family Medicine

## 2013-12-02 VITALS — BP 132/76 | HR 77 | Temp 98.6°F | Resp 16 | Ht 66.5 in | Wt 180.0 lb

## 2013-12-02 DIAGNOSIS — J209 Acute bronchitis, unspecified: Secondary | ICD-10-CM

## 2013-12-02 DIAGNOSIS — R05 Cough: Secondary | ICD-10-CM

## 2013-12-02 DIAGNOSIS — J329 Chronic sinusitis, unspecified: Secondary | ICD-10-CM

## 2013-12-02 DIAGNOSIS — H109 Unspecified conjunctivitis: Secondary | ICD-10-CM

## 2013-12-02 DIAGNOSIS — R059 Cough, unspecified: Secondary | ICD-10-CM

## 2013-12-02 MED ORDER — FLUTICASONE PROPIONATE 50 MCG/ACT NA SUSP: NASAL | Status: DC

## 2013-12-02 MED ORDER — AMOXICILLIN 875 MG PO TABS: 875.0000 mg | ORAL_TABLET | Freq: Two times a day (BID) | ORAL | Status: DC

## 2013-12-02 MED ORDER — HYDROCODONE-HOMATROPINE 5-1.5 MG/5ML PO SYRP: 5.0000 mL | ORAL_SOLUTION | ORAL | Status: DC | PRN

## 2013-12-02 MED ORDER — OFLOXACIN 0.3 % OP SOLN: OPHTHALMIC | Status: DC

## 2013-12-02 NOTE — Addendum Note (Signed)
Addended by: Madie Reno on: 12/02/2013 01:24 PM   Modules accepted: Level of Service

## 2013-12-02 NOTE — Patient Instructions (Signed)
Drink plenty of fluids  Use the eye drops every 3 hours today and tomorrow, then decrease to 4 times daily  Take the cough syrup when needed about every 4-6 hours. It will make you a little drowsy  Take the antibiotic one twice daily  Use the nose spray 2 sprays in each nostril twice daily for 3 days, then once daily  Return if worse or not improving

## 2013-12-02 NOTE — Progress Notes (Signed)
Subjective: Patient is here for a infection she's had for about 8 days. She has head congestion and pain over her right eye. Both eyes have gotten inflamed the last couple of days. Her nose is congested and burns. Throat is okay but has drainage down it. She is coughing all the time keeping her awake at night.  Objective: No acute distress. Bilateral significant injected eyes. TMs are normal. Nose congested. Fundi were benign. Throat clear. Neck supple without nodes or thyromegaly. Chest is clear to auscultation. She does have a wet cough. Heart regular without murmurs.  Assessment: Sinusitis Bronchitis Bilateral conjunctivitis URI  Plan: Hycodan Amoxicillin Ofloxacin eyedrops Flonase nose spray. Instructed her on use of it.  Return if worse or not better.

## 2014-01-05 ENCOUNTER — Other Ambulatory Visit: Payer: Self-pay | Admitting: Internal Medicine

## 2014-01-09 ENCOUNTER — Emergency Department (HOSPITAL_COMMUNITY)
Admission: EM | Admit: 2014-01-09 | Discharge: 2014-01-10 | Disposition: A | Discharge status: Home / Self Care | Payer: Medicare HMO | Attending: Emergency Medicine | Admitting: Emergency Medicine

## 2014-01-09 ENCOUNTER — Encounter (HOSPITAL_COMMUNITY): Payer: Self-pay | Admitting: Emergency Medicine

## 2014-01-09 ENCOUNTER — Emergency Department (HOSPITAL_COMMUNITY): Payer: Medicare HMO

## 2014-01-09 DIAGNOSIS — Z8619 Personal history of other infectious and parasitic diseases: Secondary | ICD-10-CM | POA: Insufficient documentation

## 2014-01-09 DIAGNOSIS — Z79899 Other long term (current) drug therapy: Secondary | ICD-10-CM | POA: Insufficient documentation

## 2014-01-09 DIAGNOSIS — K2970 Gastritis, unspecified, without bleeding: Secondary | ICD-10-CM | POA: Insufficient documentation

## 2014-01-09 DIAGNOSIS — I10 Essential (primary) hypertension: Secondary | ICD-10-CM | POA: Insufficient documentation

## 2014-01-09 DIAGNOSIS — Z7982 Long term (current) use of aspirin: Secondary | ICD-10-CM | POA: Insufficient documentation

## 2014-01-09 DIAGNOSIS — K2990 Gastroduodenitis, unspecified, without bleeding: Principal | ICD-10-CM

## 2014-01-09 DIAGNOSIS — F411 Generalized anxiety disorder: Secondary | ICD-10-CM | POA: Insufficient documentation

## 2014-01-09 DIAGNOSIS — Z8673 Personal history of transient ischemic attack (TIA), and cerebral infarction without residual deficits: Secondary | ICD-10-CM | POA: Insufficient documentation

## 2014-01-09 DIAGNOSIS — M549 Dorsalgia, unspecified: Secondary | ICD-10-CM | POA: Insufficient documentation

## 2014-01-09 DIAGNOSIS — Z90710 Acquired absence of both cervix and uterus: Secondary | ICD-10-CM | POA: Insufficient documentation

## 2014-01-09 LAB — CBC WITH DIFFERENTIAL/PLATELET
Basophils Absolute: 0 10*3/uL (ref 0.0–0.1)
Basophils Relative: 0 % (ref 0–1)
Eosinophils Absolute: 0.1 10*3/uL (ref 0.0–0.7)
Eosinophils Relative: 2 % (ref 0–5)
HCT: 35.3 % — ABNORMAL LOW (ref 36.0–46.0)
Hemoglobin: 11.9 g/dL — ABNORMAL LOW (ref 12.0–15.0)
Lymphocytes Relative: 28 % (ref 12–46)
Lymphs Abs: 2.5 10*3/uL (ref 0.7–4.0)
MCH: 27.6 pg (ref 26.0–34.0)
MCHC: 33.7 g/dL (ref 30.0–36.0)
MCV: 81.9 fL (ref 78.0–100.0)
Monocytes Absolute: 0.6 10*3/uL (ref 0.1–1.0)
Monocytes Relative: 7 % (ref 3–12)
Neutro Abs: 5.8 10*3/uL (ref 1.7–7.7)
Neutrophils Relative %: 64 % (ref 43–77)
Platelets: 209 10*3/uL (ref 150–400)
RBC: 4.31 MIL/uL (ref 3.87–5.11)
RDW: 12.7 % (ref 11.5–15.5)
WBC: 9 10*3/uL (ref 4.0–10.5)

## 2014-01-09 MED ORDER — PANTOPRAZOLE SODIUM 40 MG IV SOLR
40.0000 mg | Freq: Once | INTRAVENOUS | Status: AC
  Administered 2014-01-09: 40 mg via INTRAVENOUS
  Filled 2014-01-09: qty 40

## 2014-01-09 MED ORDER — DEXTROSE 50 % IV SOLN: 50.0000 mL | Freq: Once | INTRAVENOUS | Status: DC

## 2014-01-09 MED ORDER — GI COCKTAIL ~~LOC~~
30.0000 mL | Freq: Once | ORAL | Status: AC
  Administered 2014-01-09: 30 mL via ORAL
  Filled 2014-01-09: qty 30

## 2014-01-09 NOTE — ED Provider Notes (Signed)
CSN: [id number]     Arrival date & time 01/09/14  2242 History   First MD Initiated Contact with Patient 01/09/14 2302     Chief Complaint  Patient presents with  . Abdominal Pain     (Consider location/radiation/quality/duration/timing/severity/associated sxs/prior Treatment) HPI Patient presents with epigastric pain starting roughly one hour after eating this evening. Patient states the pain radiates to her back and is associated with nausea but no vomiting. Denies recent NSAID usage or abuse alcohol. She denies any chest pain or shortness of breath. Denies any blood in stool or melena. She does have a history of hiatal hernia and reflux disease. She denies any smoking history or family history of coronary artery disease. Past Medical History  Diagnosis Date  . ABDOMINAL PAIN, RECURRENT 03/29/2008  . ANXIETY 10/07/2007  . Candidiasis of mouth 05/30/2010  . GERD 07/25/2007  . HYPERTENSION 07/25/2007  . Hernia, hiatal   . Stroke    Past Surgical History  Procedure Laterality Date  . Abdominal hysterectomy    . Knee arthroscopy    . Breast surgery      bx   Family History  Problem Relation Age of Onset  . Kidney disease Neg Hx   . Stroke Neg Hx   . Diabetes Neg Hx    History  Substance Use Topics  . Smoking status: Never Smoker   . Smokeless tobacco: Never Used  . Alcohol Use: Yes     Comment: occasional   OB History   Grav Para Term Preterm Abortions TAB SAB Ect Mult Living                 Review of Systems  Constitutional: Negative for fever and chills.  Respiratory: Negative for shortness of breath.   Cardiovascular: Negative for chest pain.  Gastrointestinal: Positive for nausea and abdominal pain. Negative for vomiting, diarrhea, constipation and blood in stool.  Musculoskeletal: Positive for back pain. Negative for neck pain and neck stiffness.  Skin: Negative for rash and wound.  Neurological: Negative for dizziness, weakness, numbness and headaches.  All other  systems reviewed and are negative.      Allergies  Codeine phosphate  Home Medications   Current Outpatient Rx  Name  Route  Sig  Dispense  Refill  . aspirin EC 81 MG tablet   Oral   Take 81 mg by mouth every morning.          . bisacodyl (WOMENS LAXATIVE) 5 MG EC tablet   Oral   Take 5 mg by mouth daily as needed for constipation.         . clonazePAM (KLONOPIN) 1 MG tablet      TAKE 1 TABLET BY MOUTH TWICE A DAY AS NEEDED         . FLUoxetine (PROZAC) 20 MG capsule      TAKE 1 CAPSULE EVERY DAY   90 capsule   1   . hydrochlorothiazide (HYDRODIURIL) 25 MG tablet      TAKE 1 TABLET EVERY DAY   90 tablet   1    Pulse 61  Temp(Src) 98.1 F (36.7 C)  Resp 17  SpO2 96% Physical Exam  Nursing note and vitals reviewed. Constitutional: She is oriented to person, place, and time. She appears well-developed and well-nourished. No distress.  HENT:  Head: Normocephalic and atraumatic.  Mouth/Throat: Oropharynx is clear and moist.  Eyes: EOM are normal. Pupils are equal, round, and reactive to light.  Neck: Normal range of motion.  Neck supple.  Cardiovascular: Normal rate and regular rhythm.   Pulmonary/Chest: Effort normal and breath sounds normal. No respiratory distress. She has no wheezes. She has no rales. She exhibits no tenderness.  Abdominal: Soft. Bowel sounds are normal. She exhibits no distension and no mass. There is tenderness (tenderness to palpation in the epigastric region.). There is no rebound and no guarding.  Musculoskeletal: Normal range of motion. She exhibits no edema and no tenderness.  No calf swelling or tenderness.  Neurological: She is alert and oriented to person, place, and time.  Moves all extremities without deficit. Sensation grossly intact.  Skin: Skin is warm and dry. No rash noted. No erythema.  Psychiatric: She has a normal mood and affect. Her behavior is normal.    ED Course  Procedures (including critical care  time) Labs Review Labs Reviewed  CBC WITH DIFFERENTIAL - Abnormal; Notable for the following:    Hemoglobin 11.9 (*)    HCT 35.3 (*)    All other components within normal limits  COMPREHENSIVE METABOLIC PANEL  TROPONIN I  LIPASE, BLOOD  URINALYSIS, ROUTINE W REFLEX MICROSCOPIC   Imaging Review No results found.  EKG Interpretation   None      Date: 01/10/2014  Rate: 59  Rhythm: sinus bradycardia  QRS Axis: normal  Intervals: normal  ST/T Wave abnormalities: normal  Conduction Disutrbances:none  Narrative Interpretation:   Old EKG Reviewed: none available    MDM   Final diagnoses:  None    On further questioning patient admits to frequent epigastric pain that radiates up into the back of the throat. She said she takes Rolaids frequently. Her pain is much better with the GI cocktail. Patient been encouraged to make appointment to followup with gastroenterology. Return precautions have been given and patient voiced understanding.    Julianne Rice, MD 01/10/14 [phone]

## 2014-01-09 NOTE — ED Notes (Signed)
Pt BIB EMS.  Per EMS, pt with epigastric abdominal pain that started an hr ago PTA.  Pt reports nausea no vomiting or diarrhea.  Pt given 4 mg of Zofran and 150 mcg of Fentanyl.  Pt initially 10/10; currently 8/10.  Pt sts pain starts in epigastric region and radiates to her back.  Pt sts "it's sore".

## 2014-01-10 LAB — COMPREHENSIVE METABOLIC PANEL
ALT: 21 U/L (ref 0–35)
AST: 39 U/L — ABNORMAL HIGH (ref 0–37)
Albumin: 3.5 g/dL (ref 3.5–5.2)
Alkaline Phosphatase: 114 U/L (ref 39–117)
BUN: 15 mg/dL (ref 6–23)
CO2: 27 mEq/L (ref 19–32)
Calcium: 9.2 mg/dL (ref 8.4–10.5)
Chloride: 98 mEq/L (ref 96–112)
Creatinine, Ser: 0.83 mg/dL (ref 0.50–1.10)
GFR calc Af Amer: 83 mL/min — ABNORMAL LOW (ref >90)
GFR calc non Af Amer: 72 mL/min — ABNORMAL LOW (ref >90)
Glucose, Bld: 146 mg/dL — ABNORMAL HIGH (ref 70–99)
Potassium: 2.9 mEq/L — CL (ref 3.7–5.3)
Sodium: 142 mEq/L (ref 137–147)
Total Bilirubin: 0.3 mg/dL (ref 0.3–1.2)
Total Protein: 6.7 g/dL (ref 6.0–8.3)

## 2014-01-10 LAB — LIPASE, BLOOD: Lipase: 24 U/L (ref 11–59)

## 2014-01-10 LAB — TROPONIN I: Troponin I: <0.3 ng/mL (ref <0.30)

## 2014-01-10 MED ORDER — FAMOTIDINE 40 MG PO TABS: 40.0000 mg | ORAL_TABLET | Freq: Every day | ORAL | Status: DC

## 2014-01-10 MED ORDER — TRAMADOL HCL 50 MG PO TABS: 50.0000 mg | ORAL_TABLET | Freq: Four times a day (QID) | ORAL | Status: DC | PRN

## 2014-01-12 ENCOUNTER — Emergency Department (HOSPITAL_COMMUNITY): Payer: Medicare HMO

## 2014-01-12 ENCOUNTER — Emergency Department (HOSPITAL_COMMUNITY)
Admission: EM | Admit: 2014-01-12 | Discharge: 2014-01-12 | Disposition: A | Discharge status: Home / Self Care | Payer: Medicare HMO | Attending: Emergency Medicine | Admitting: Emergency Medicine

## 2014-01-12 ENCOUNTER — Encounter (HOSPITAL_COMMUNITY): Payer: Self-pay | Admitting: Emergency Medicine

## 2014-01-12 DIAGNOSIS — Z8619 Personal history of other infectious and parasitic diseases: Secondary | ICD-10-CM | POA: Insufficient documentation

## 2014-01-12 DIAGNOSIS — K219 Gastro-esophageal reflux disease without esophagitis: Secondary | ICD-10-CM | POA: Insufficient documentation

## 2014-01-12 DIAGNOSIS — Z7982 Long term (current) use of aspirin: Secondary | ICD-10-CM | POA: Insufficient documentation

## 2014-01-12 DIAGNOSIS — Z8673 Personal history of transient ischemic attack (TIA), and cerebral infarction without residual deficits: Secondary | ICD-10-CM | POA: Insufficient documentation

## 2014-01-12 DIAGNOSIS — I10 Essential (primary) hypertension: Secondary | ICD-10-CM | POA: Insufficient documentation

## 2014-01-12 DIAGNOSIS — Z8719 Personal history of other diseases of the digestive system: Secondary | ICD-10-CM

## 2014-01-12 DIAGNOSIS — R079 Chest pain, unspecified: Secondary | ICD-10-CM

## 2014-01-12 DIAGNOSIS — F411 Generalized anxiety disorder: Secondary | ICD-10-CM | POA: Insufficient documentation

## 2014-01-12 DIAGNOSIS — Z79899 Other long term (current) drug therapy: Secondary | ICD-10-CM | POA: Insufficient documentation

## 2014-01-12 LAB — CBC WITH DIFFERENTIAL/PLATELET
Basophils Absolute: 0 10*3/uL (ref 0.0–0.1)
Basophils Relative: 0 % (ref 0–1)
Eosinophils Absolute: 0.1 10*3/uL (ref 0.0–0.7)
Eosinophils Relative: 1 % (ref 0–5)
HCT: 37.1 % (ref 36.0–46.0)
Hemoglobin: 12.2 g/dL (ref 12.0–15.0)
Lymphocytes Relative: 24 % (ref 12–46)
Lymphs Abs: 1.4 10*3/uL (ref 0.7–4.0)
MCH: 27.5 pg (ref 26.0–34.0)
MCHC: 32.9 g/dL (ref 30.0–36.0)
MCV: 83.6 fL (ref 78.0–100.0)
Monocytes Absolute: 0.3 10*3/uL (ref 0.1–1.0)
Monocytes Relative: 6 % (ref 3–12)
Neutro Abs: 3.9 10*3/uL (ref 1.7–7.7)
Neutrophils Relative %: 69 % (ref 43–77)
Platelets: 213 10*3/uL (ref 150–400)
RBC: 4.44 MIL/uL (ref 3.87–5.11)
RDW: 13.1 % (ref 11.5–15.5)
WBC: 5.7 10*3/uL (ref 4.0–10.5)

## 2014-01-12 LAB — BASIC METABOLIC PANEL
BUN: 13 mg/dL (ref 6–23)
CO2: 29 mEq/L (ref 19–32)
Calcium: 9.3 mg/dL (ref 8.4–10.5)
Chloride: 98 mEq/L (ref 96–112)
Creatinine, Ser: 0.92 mg/dL (ref 0.50–1.10)
GFR calc Af Amer: 74 mL/min — ABNORMAL LOW (ref >90)
GFR calc non Af Amer: 63 mL/min — ABNORMAL LOW (ref >90)
Glucose, Bld: 97 mg/dL (ref 70–99)
Potassium: 3.2 mEq/L — ABNORMAL LOW (ref 3.7–5.3)
Sodium: 141 mEq/L (ref 137–147)

## 2014-01-12 LAB — POCT I-STAT TROPONIN I
Troponin i, poc: 0.01 ng/mL (ref 0.00–0.08)
Troponin i, poc: 0.01 ng/mL (ref 0.00–0.08)

## 2014-01-12 MED ORDER — NITROGLYCERIN 2 % TD OINT
1.0000 [in_us] | TOPICAL_OINTMENT | Freq: Once | TRANSDERMAL | Status: AC
  Administered 2014-01-12: 1 [in_us] via TOPICAL
  Filled 2014-01-12: qty 1

## 2014-01-12 MED ORDER — MORPHINE SULFATE 4 MG/ML IJ SOLN
4.0000 mg | Freq: Once | INTRAMUSCULAR | Status: AC
  Administered 2014-01-12: 2 mg via INTRAVENOUS
  Filled 2014-01-12: qty 1

## 2014-01-12 MED ORDER — GI COCKTAIL ~~LOC~~
30.0000 mL | Freq: Once | ORAL | Status: AC
  Administered 2014-01-12: 30 mL via ORAL
  Filled 2014-01-12: qty 30

## 2014-01-12 MED ORDER — ESOMEPRAZOLE MAGNESIUM 20 MG PO CPDR: 20.0000 mg | DELAYED_RELEASE_CAPSULE | Freq: Every day | ORAL | Status: DC

## 2014-01-12 NOTE — ED Notes (Signed)
Pt comfortable with d/c and f/u instructions. Prescriptions x1 

## 2014-01-12 NOTE — Discharge Instructions (Signed)
Chest Pain (Nonspecific) It is often hard to give a specific diagnosis for the cause of chest pain. There is always a chance that your pain could be related to something serious, such as a heart attack or a blood clot in the lungs. You need to follow up with your caregiver for further evaluation.  You need to call cardiology for formal stress testing. Your pain may be secondary to gastritis; however, you also has very factors for heart disease. If you have any worsening of symptoms she should come back for further evaluation. CAUSES   Heartburn.  Pneumonia or bronchitis.  Anxiety or stress.  Inflammation around your heart (pericarditis) or lung (pleuritis or pleurisy).  A blood clot in the lung.  A collapsed lung (pneumothorax). It can develop suddenly on its own (spontaneous pneumothorax) or from injury (trauma) to the chest.  Shingles infection (herpes zoster virus). The chest wall is composed of bones, muscles, and cartilage. Any of these can be the source of the pain.  The bones can be bruised by injury.  The muscles or cartilage can be strained by coughing or overwork.  The cartilage can be affected by inflammation and become sore (costochondritis). DIAGNOSIS  Lab tests or other studies, such as X-rays, electrocardiography, stress testing, or cardiac imaging, may be needed to find the cause of your pain.  TREATMENT   Treatment depends on what may be causing your chest pain. Treatment may include:  Acid blockers for heartburn.  Anti-inflammatory medicine.  Pain medicine for inflammatory conditions.  Antibiotics if an infection is present.  You may be advised to change lifestyle habits. This includes stopping smoking and avoiding alcohol, caffeine, and chocolate.  You may be advised to keep your head raised (elevated) when sleeping. This reduces the chance of acid going backward from your stomach into your esophagus.  Most of the time, nonspecific chest pain will  improve within 2 to 3 days with rest and mild pain medicine. HOME CARE INSTRUCTIONS   If antibiotics were prescribed, take your antibiotics as directed. Finish them even if you start to feel better.  For the next few days, avoid physical activities that bring on chest pain. Continue physical activities as directed.  Do not smoke.  Avoid drinking alcohol.  Only take over-the-counter or prescription medicine for pain, discomfort, or fever as directed by your caregiver.  Follow your caregiver's suggestions for further testing if your chest pain does not go away.  Keep any follow-up appointments you made. If you do not go to an appointment, you could develop lasting (chronic) problems with pain. If there is any problem keeping an appointment, you must call to reschedule. SEEK MEDICAL CARE IF:   You think you are having problems from the medicine you are taking. Read your medicine instructions carefully.  Your chest pain does not go away, even after treatment.  You develop a rash with blisters on your chest. SEEK IMMEDIATE MEDICAL CARE IF:   You have increased chest pain or pain that spreads to your arm, neck, jaw, back, or abdomen.  You develop shortness of breath, an increasing cough, or you are coughing up blood.  You have severe back or abdominal pain, feel nauseous, or vomit.  You develop severe weakness, fainting, or chills.  You have a fever. THIS IS AN EMERGENCY. Do not wait to see if the pain will go away. Get medical help at once. Call your local emergency services (911 in [place] Do not drive yourself to the hospital. MAKE SURE  YOU:   Understand these instructions.  Will watch your condition.  Will get help right away if you are not doing well or get worse. Document Released: 08/26/2005 Document Revised: 02/08/2012 Document Reviewed: 06/21/2008 [facility] Patient Information 2014 [place].

## 2014-01-12 NOTE — ED Notes (Signed)
PT presents from home via [facility] with c/o sudden onset of 10/10 chest pressure that began at 0700 this morning. Pt took antacid medication with no relief and called EMS. Pt given 324mg  ASA and 1 SL nitro by EMS, this decreased the pressure to 1/10. Pt A&Ox4, hx stroke, denies Cardiac hx.

## 2014-01-12 NOTE — ED Provider Notes (Addendum)
CSN: [id number]     Arrival date & time 01/12/14  1126 History   First MD Initiated Contact with Patient 01/12/14 1128     Chief Complaint  Patient presents with  . Chest Pain     (Consider location/radiation/quality/duration/timing/severity/associated sxs/prior Treatment) HPI  This a 68 yo female with a history of hypertension, CVA who presents with chest pain. Patient presents by EMS. She reported onset of 10 out of 10 chest pain this morning at 7 AM. She reports that the pain is pressure-like and nonradiating. Currently the pain is 1 out of 10 following nitroglycerin by EMS. Patient was also given a full dose aspirin. Patient reports she had similar pain and was seen on Wednesday night. She was given mylanta which have not relieved the pain. She reports that the pain is worse with food.  Not related to exertion. Denies any cardiac history. Denies any early family history of heart disease.  Past Medical History  Diagnosis Date  . ABDOMINAL PAIN, RECURRENT 03/29/2008  . ANXIETY 10/07/2007  . Candidiasis of mouth 05/30/2010  . GERD 07/25/2007  . HYPERTENSION 07/25/2007  . Hernia, hiatal   . Stroke    Past Surgical History  Procedure Laterality Date  . Abdominal hysterectomy    . Knee arthroscopy    . Breast surgery      bx   Family History  Problem Relation Age of Onset  . Kidney disease Neg Hx   . Stroke Neg Hx   . Diabetes Neg Hx    History  Substance Use Topics  . Smoking status: Never Smoker   . Smokeless tobacco: Never Used  . Alcohol Use: Yes     Comment: occasional   OB History   Grav Para Term Preterm Abortions TAB SAB Ect Mult Living                 Review of Systems  Constitutional: Negative for fever.  Respiratory: Positive for chest tightness. Negative for cough and shortness of breath.   Cardiovascular: Positive for chest pain. Negative for leg swelling.  Gastrointestinal: Negative for nausea, vomiting and abdominal pain.  Genitourinary: Negative for  dysuria.  Musculoskeletal: Negative for back pain.  Skin: Negative for wound.  Neurological: Negative for headaches.  Psychiatric/Behavioral: Negative for confusion.  All other systems reviewed and are negative.      Allergies  Codeine phosphate and Morphine and related  Home Medications   Current Outpatient Rx  Name  Route  Sig  Dispense  Refill  . aspirin EC 81 MG tablet   Oral   Take 81 mg by mouth every morning.          . bisacodyl (WOMENS LAXATIVE) 5 MG EC tablet   Oral   Take 5 mg by mouth daily as needed for constipation.         . Ca Carbonate-Mag Hydroxide (MYLANTA SUPREME) 400-135 MG/5ML SUSP   Oral   Take 15 mLs by mouth daily.          . clonazePAM (KLONOPIN) 1 MG tablet   Oral   Take 1 mg by mouth 2 (two) times daily as needed for anxiety.         . famotidine (PEPCID) 40 MG tablet   Oral   Take 1 tablet (40 mg total) by mouth daily.   30 tablet   0   . FLUoxetine (PROZAC) 20 MG capsule   Oral   Take 20 mg by mouth daily.         Marland Kitchen  hydrochlorothiazide (HYDRODIURIL) 25 MG tablet   Oral   Take 25 mg by mouth daily.         . traMADol (ULTRAM) 50 MG tablet   Oral   Take 1 tablet (50 mg total) by mouth every 6 (six) hours as needed for moderate pain.   10 tablet   0   . esomeprazole (NEXIUM) 20 MG capsule   Oral   Take 1 capsule (20 mg total) by mouth daily.   30 capsule   0    BP 129/63  Pulse 53  Temp(Src) 98.2 F (36.8 C) (Oral)  Resp 20  Ht 5\' 4"  (1.626 m)  Wt 179 lb 8 oz (81.421 kg)  BMI 30.80 kg/m2  SpO2 95% Physical Exam  Nursing note and vitals reviewed. Constitutional: She is oriented to person, place, and time. No distress.  Chronically ill-appearing  HENT:  Head: Normocephalic and atraumatic.  Eyes: Pupils are equal, round, and reactive to light.  Neck: Neck supple.  Cardiovascular: Normal rate, regular rhythm and normal heart sounds.   No murmur heard. Pulmonary/Chest: Effort normal and breath sounds  normal. No respiratory distress. She has no wheezes. She exhibits tenderness.  Abdominal: Soft. Bowel sounds are normal. There is no tenderness.  Musculoskeletal: She exhibits no edema.  Neurological: She is alert and oriented to person, place, and time.  Skin: Skin is warm and dry.  Psychiatric: She has a normal mood and affect.    ED Course  Procedures (including critical care time) Labs Review Labs Reviewed  BASIC METABOLIC PANEL - Abnormal; Notable for the following:    Potassium 3.2 (*)    GFR calc non Af Amer 63 (*)    GFR calc Af Amer 74 (*)    All other components within normal limits  CBC WITH DIFFERENTIAL  POCT I-STAT TROPONIN I  POCT I-STAT TROPONIN I   Imaging Review Dg Chest 2 View  01/12/2014   CLINICAL DATA:  Left-sided chest pain and weakness for 1 day  EXAM: CHEST  2 VIEW  COMPARISON:  DG CHEST 2 VIEW dated 01/09/2014  FINDINGS: The heart size and mediastinal contours are within normal limits. Both lungs are clear. The visualized skeletal structures are unremarkable. Aortic arch calcification is unchanged.  IMPRESSION: No active cardiopulmonary disease.   Electronically Signed   By: Skipper Cliche M.D.   On: 01/12/2014 12:44    EKG Interpretation    Date/Time:  Friday January 12 2014 11:34:14 EST Ventricular Rate:  53 PR Interval:  143 QRS Duration: 88 QT Interval:  450 QTC Calculation: 422 R Axis:   50 Text Interpretation:  Sinus rhythm No significant change since last tracing Reconfirmed by HORTON  MD, Loma Sousa ([id number]) on 01/12/2014 4:10:29 PM            MDM   Final diagnoses:  Chest pain  History of gastritis   Patient presents with chest pain. Onset at 7 AM. Current pain is one out of 10.  Physical exam is noted for tenderness to palpation over the sternum that reproduces the patient's pain. Patient also notes pain is worse with food. Pain is somewhat atypical for ACS. Patient was given nitroglycerin and a GI cocktail. She's had improvement of  her pain.  EKG is unchanged from prior. Chest x-ray is negative. Patient had 2 sets of troponins, 1 greater than 8 hours from onset of symptoms. Patient's heart score is 3. Patient has close outpatient followup. Given the atypical nature of the symptoms and that  may have some features of gastritis/GERD we will discharge the patient with followup. She will be given cardiology followup and was instructed to call for evaluation and stress testing. She was also instructed to followup with her primary care physician. She will also be given nexium.  Patient and her daughter were encouraged return if she has new or worsening symptoms.  After history, exam, and medical workup I feel the patient has been appropriately medically screened and is safe for discharge home. Pertinent diagnoses were discussed with the patient. Patient was given return precautions.    Merryl Hacker, MD 01/12/14 1606  Merryl Hacker, MD 01/12/14 [phone]

## 2014-03-26 ENCOUNTER — Telehealth: Payer: Self-pay | Admitting: Internal Medicine

## 2014-03-26 DIAGNOSIS — K219 Gastro-esophageal reflux disease without esophagitis: Secondary | ICD-10-CM

## 2014-03-26 NOTE — Telephone Encounter (Signed)
Left detailed message order done for referral to GI and someone will be contacting you for an appointment.

## 2014-03-26 NOTE — Telephone Encounter (Signed)
Pt would like referral to GI doc. Pt has burning form throat to stomach. Pt states she has tried all meds otc, w/ no success. Pt states this is every day. No matter if she eats or doesn't eat, the burning is continuous.  pls advise

## 2014-03-26 NOTE — Telephone Encounter (Signed)
ok 

## 2014-03-26 NOTE — Telephone Encounter (Signed)
Okay to do referral to GI?

## 2014-03-27 ENCOUNTER — Encounter: Payer: Self-pay | Admitting: Internal Medicine

## 2014-03-27 ENCOUNTER — Encounter: Payer: Self-pay | Admitting: Physician Assistant

## 2014-03-29 ENCOUNTER — Encounter: Payer: Self-pay | Admitting: Physician Assistant

## 2014-03-29 ENCOUNTER — Ambulatory Visit (INDEPENDENT_AMBULATORY_CARE_PROVIDER_SITE_OTHER): Payer: Medicare HMO | Admitting: Physician Assistant

## 2014-03-29 VITALS — BP 110/66 | HR 68 | Ht 64.0 in | Wt 186.0 lb

## 2014-03-29 DIAGNOSIS — F3289 Other specified depressive episodes: Secondary | ICD-10-CM

## 2014-03-29 DIAGNOSIS — R12 Heartburn: Secondary | ICD-10-CM

## 2014-03-29 DIAGNOSIS — F329 Major depressive disorder, single episode, unspecified: Secondary | ICD-10-CM

## 2014-03-29 DIAGNOSIS — K5900 Constipation, unspecified: Secondary | ICD-10-CM

## 2014-03-29 DIAGNOSIS — K219 Gastro-esophageal reflux disease without esophagitis: Secondary | ICD-10-CM

## 2014-03-29 DIAGNOSIS — F32A Depression, unspecified: Secondary | ICD-10-CM | POA: Insufficient documentation

## 2014-03-29 DIAGNOSIS — K5909 Other constipation: Secondary | ICD-10-CM

## 2014-03-29 MED ORDER — DEXLANSOPRAZOLE 60 MG PO CPDR: 60.0000 mg | DELAYED_RELEASE_CAPSULE | Freq: Every day | ORAL | Status: DC

## 2014-03-29 NOTE — Patient Instructions (Signed)
You have been scheduled for an endoscopy and colonoscopy with propofol. Please follow the written instructions given to you at your visit today. We gave you the sample of the prep you have to drink for the colonoscopy. If you use inhalers (even only as needed), please bring them with you on the day of your procedure. We have given you samples of Dexilant for heartburn, Reflux.  Take Miralax, 17 grams of powder in 8 oz of juice or water. Daily until bowels move on a regular basis. You can also take prune juice , you can warm it before you drink it.    Take Baby aspirin, 1 daily.

## 2014-03-29 NOTE — Progress Notes (Signed)
Subjective:    Patient ID: Erica Morris, female    DOB: Sep 27, 1946, 68 y.o.   MRN: [id number]  HPI  Erica Morris is a pleasant 68 year old white female known remotely to Dr. Lyla Son She had a colonoscopy in 2001 which was a normal exam. She has history of hypertension, depression, anxiety. She comes in today with complaints of refractory heartburn. She had 2 emergency room visits in February 2015 1 with complaint of chest pain and 1 with complaints of epigastric pain and was diagnosed with heartburn gastritis. She was given a prescription for Nexium which she says she took for a while but it didn't seem to help so she stopped it. She states that she has tried multiple different over-the-counter medicines including Prilosec ,Prevacid and AcipHex none of which he finds helpful. She's currently been using Tums She says that she has heartburn and indigestion every day and frequently has during the day and during the nighttime hours waking her from sleep. She says she does have the head of her bed elevated. She has vague complaints of dysphagia- feeling like food sits in her esophagus at times. No complaint of abdominal pain. Her appetite is fair, her weight is  Stable.. She also has chronic problems with constipation which she says she's had for years. She frequently has to try to self disimpact. She also uses suppositories ,milk of magnesia, and ex-lax on a when necessary basis. She says she usually waits  3-4 days and if no bowel movement will start taking laxatives. Family history is negative for colon cancer polyps. Patient is taking 2 baby aspirin everyday for preventative reasons.    Review of Systems  Constitutional: Positive for fatigue.  HENT: Positive for trouble swallowing.   Eyes: Negative.   Respiratory: Negative.   Cardiovascular: Positive for chest pain.  Gastrointestinal: Positive for constipation.  Endocrine: Negative.   Genitourinary: Negative.   Musculoskeletal: Negative.     Allergic/Immunologic: Negative.   Neurological: Negative.   Hematological: Negative.   Psychiatric/Behavioral: Negative.    Outpatient Prescriptions Prior to Visit  Medication Sig Dispense Refill  . aspirin EC 81 MG tablet Take 81 mg by mouth every morning.       . Ca Carbonate-Mag Hydroxide (MYLANTA SUPREME) 400-135 MG/5ML SUSP Take 15 mLs by mouth daily.       . clonazePAM (KLONOPIN) 1 MG tablet Take 1 mg by mouth 2 (two) times daily as needed for anxiety.      Marland Kitchen FLUoxetine (PROZAC) 20 MG capsule Take 20 mg by mouth daily.      . hydrochlorothiazide (HYDRODIURIL) 25 MG tablet Take 25 mg by mouth daily.      . bisacodyl (WOMENS LAXATIVE) 5 MG EC tablet Take 5 mg by mouth daily as needed for constipation.      Marland Kitchen esomeprazole (NEXIUM) 20 MG capsule Take 1 capsule (20 mg total) by mouth daily.  30 capsule  0  . famotidine (PEPCID) 40 MG tablet Take 1 tablet (40 mg total) by mouth daily.  30 tablet  0  . traMADol (ULTRAM) 50 MG tablet Take 1 tablet (50 mg total) by mouth every 6 (six) hours as needed for moderate pain.  10 tablet  0   No facility-administered medications prior to visit.   Allergies  Allergen Reactions  . Codeine Phosphate Itching  . Morphine And Related Itching    Burning sensation and itchiness with IV morphine   Patient Active Problem List   Diagnosis Date Noted  . Depression  03/29/2014  . Chronic constipation 03/29/2014  . CANDIDIASIS OF MOUTH 05/30/2010  . ABDOMINAL PAIN, RECURRENT 03/29/2008  . ANXIETY 10/07/2007  . HYPERTENSION 07/25/2007  . GERD 07/25/2007   History  Substance Use Topics  . Smoking status: Never Smoker   . Smokeless tobacco: Never Used  . Alcohol Use: Yes     Comment: occasional   family history is negative for Kidney disease, Stroke, and Diabetes.      Objective:   Physical Exam  and older white female in no acute distress, pleasant accompanied by her daughter blood pressure 110/66 pulse 68 height 5 foot 4 weight 186. HEENT  ;nontraumatic normocephalic EOMI PERRLA, she is edentulous, sclera anicteric, Supple; no JVD, Cardiovascular regular rate and rhythm with S1-S2 no murmur or gallop, Pulmonary; clear bilaterally, Abdomen; soft this clean nontender there is no palpable mass or hepatosplenomegaly bowel sounds are active, Rectal; exam not done, Extremities ;no clubbing cyanosis or edema skin warm and dry, Psych; mood and affect appropriate,        Assessment & Plan:  #37  68 year old female with chronic heartburn and GERD. Refractory to over-the-counter dosages of PPIs. I suspect she just has chronic acid reflux though need to rule out eosinophilic esophagitis #2 chronic constipation #3 colon neoplasia surveillance last colonoscopy 2001 due for followup #4 anxiety and depression #5 hypertension  Plan; Decreasedbaby aspirin one daily Add Dexilant 60 mg by mouth every morning patient was given samples today and will: Prescription if she finds this helpful Schedule for colonoscopy and EGD with Dr. Deatra Ina. Procedure is discussed in detail with the patient and her daughter and they're agreeable to proceed Antireflux regimen reviewed in detail including elevation of the head of the bed and n.p.o. for 2 hours prior to that time Start MiraLax 17 g with prune juice every morning

## 2014-03-30 NOTE — Progress Notes (Signed)
Reviewed and agree with management. Robert D. Kaplan, M.D., FACG  

## 2014-04-05 ENCOUNTER — Ambulatory Visit (AMBULATORY_SURGERY_CENTER): Payer: Commercial Managed Care - HMO | Admitting: Gastroenterology

## 2014-04-05 ENCOUNTER — Encounter: Payer: Self-pay | Admitting: Gastroenterology

## 2014-04-05 VITALS — BP 108/72 | HR 56 | Temp 98.7°F | Resp 18 | Ht 64.0 in | Wt 186.0 lb

## 2014-04-05 DIAGNOSIS — Z1211 Encounter for screening for malignant neoplasm of colon: Secondary | ICD-10-CM

## 2014-04-05 DIAGNOSIS — F411 Generalized anxiety disorder: Secondary | ICD-10-CM | POA: Diagnosis not present

## 2014-04-05 DIAGNOSIS — F3289 Other specified depressive episodes: Secondary | ICD-10-CM | POA: Diagnosis not present

## 2014-04-05 DIAGNOSIS — D126 Benign neoplasm of colon, unspecified: Secondary | ICD-10-CM | POA: Diagnosis not present

## 2014-04-05 DIAGNOSIS — K222 Esophageal obstruction: Secondary | ICD-10-CM | POA: Diagnosis not present

## 2014-04-05 DIAGNOSIS — F329 Major depressive disorder, single episode, unspecified: Secondary | ICD-10-CM | POA: Diagnosis not present

## 2014-04-05 DIAGNOSIS — R1310 Dysphagia, unspecified: Secondary | ICD-10-CM

## 2014-04-05 DIAGNOSIS — E785 Hyperlipidemia, unspecified: Secondary | ICD-10-CM | POA: Diagnosis not present

## 2014-04-05 DIAGNOSIS — J40 Bronchitis, not specified as acute or chronic: Secondary | ICD-10-CM | POA: Diagnosis not present

## 2014-04-05 DIAGNOSIS — K219 Gastro-esophageal reflux disease without esophagitis: Secondary | ICD-10-CM | POA: Diagnosis not present

## 2014-04-05 DIAGNOSIS — J209 Acute bronchitis, unspecified: Secondary | ICD-10-CM | POA: Diagnosis not present

## 2014-04-05 DIAGNOSIS — I10 Essential (primary) hypertension: Secondary | ICD-10-CM | POA: Diagnosis not present

## 2014-04-05 DIAGNOSIS — K5900 Constipation, unspecified: Secondary | ICD-10-CM | POA: Diagnosis not present

## 2014-04-05 DIAGNOSIS — J069 Acute upper respiratory infection, unspecified: Secondary | ICD-10-CM | POA: Diagnosis not present

## 2014-04-05 DIAGNOSIS — K200 Eosinophilic esophagitis: Secondary | ICD-10-CM | POA: Diagnosis not present

## 2014-04-05 DIAGNOSIS — K648 Other hemorrhoids: Secondary | ICD-10-CM

## 2014-04-05 DIAGNOSIS — Z86010 Personal history of colonic polyps: Secondary | ICD-10-CM | POA: Diagnosis not present

## 2014-04-05 DIAGNOSIS — K5730 Diverticulosis of large intestine without perforation or abscess without bleeding: Secondary | ICD-10-CM

## 2014-04-05 DIAGNOSIS — R12 Heartburn: Secondary | ICD-10-CM | POA: Diagnosis not present

## 2014-04-05 MED ORDER — SODIUM CHLORIDE 0.9 % IV SOLN: 500.0000 mL | INTRAVENOUS | Status: DC

## 2014-04-05 NOTE — Op Note (Signed)
[place]  [address] [place], [address]   COLONOSCOPY PROCEDURE REPORT  PATIENT: Erica, Morris  MR#: [id number] BIRTHDATE: 1946/05/22 , 67  yrs. old GENDER: Female ENDOSCOPIST: Inda Castle, MD REFERRED BY: PROCEDURE DATE:  04/05/2014 PROCEDURE:   Colonoscopy with snare polypectomy and Colonoscopy with cold biopsy polypectomy First Screening Colonoscopy - Avg.  risk and is 50 yrs.  old or older - No.  Prior Negative Screening - Now for repeat screening. 10 or more years since last screening  History of Adenoma - Now for follow-up colonoscopy & has been > or = to 3 yrs.  N/A  Polyps Removed Today? Yes. ASA CLASS:   Class II INDICATIONS:Average risk patient for colon cancer. MEDICATIONS: MAC sedation, administered by CRNA and Propofol (Diprivan) 230 mg IV  DESCRIPTION OF PROCEDURE:   After the risks benefits and alternatives of the procedure were thoroughly explained, informed consent was obtained.  A digital rectal exam revealed no abnormalities of the rectum.   The LB [id number]  endoscope was introduced through the anus and advanced to the cecum, which was identified by both the appendix and ileocecal valve. No adverse events experienced.   The quality of the prep was Suprep good  The instrument was then slowly withdrawn as the colon was fully examined.      COLON FINDINGS: A sessile polyp measuring 2 mm in size was found at the cecum.  A polypectomy was performed with cold forceps.   A sessile polyp measuring 3 mm in size was found in the sigmoid colon.  A polypectomy was performed with a cold snare.  The resection was complete and the polyp tissue was completely retrieved.   Moderate diverticulosis was noted in the proximal sigmoid colon.   Internal hemorrhoids were found.  Retroflexed views revealed no abnormalities. The time to cecum=3 minutes 32 seconds.  Withdrawal time=7 minutes 30 seconds.  The scope was withdrawn and the  procedure completed. COMPLICATIONS: There were no complications.  ENDOSCOPIC IMPRESSION: 1.   Sessile polyp measuring 2 mm in size was found at the cecum; polypectomy was performed with cold forceps 2.   Sessile polyp measuring 3 mm in size was found in the sigmoid colon; polypectomy was performed with a cold snare 3.   Moderate diverticulosis was noted in the proximal sigmoid colon  4.   Internal hemorrhoids  RECOMMENDATIONS: If the polyp(s) removed today are proven to be adenomatous (pre-cancerous) polyps, you will need a repeat colonoscopy in 5 years.  Otherwise you should continue to follow colorectal cancer screening guidelines for "routine risk" patients with colonoscopy in 10 years.  You will receive a letter within 1-2 weeks with the results of your biopsy as well as final recommendations.  Please call my office if you have not received a letter after 3 weeks.   eSigned:  Inda Castle, MD 04/05/2014 3:39 PM   cc: Marletta Lor, MD   PATIENT NAME:  Erica, Morris MR#: [id number]

## 2014-04-05 NOTE — Op Note (Signed)
[place]  [address] [place], [id number]   ENDOSCOPY PROCEDURE REPORT  PATIENT: Erica Morris, Erica Morris  MR#: [id number] BIRTHDATE: 04-24-1946 , 67  yrs. old GENDER: Female ENDOSCOPIST: Inda Castle, MD REFERRED BY: PROCEDURE DATE:  04/05/2014 PROCEDURE:  EGD w/ biopsy and Maloney dilation of esophagus ASA CLASS:     Class II INDICATIONS:  Heartburn.   Dysphagia. MEDICATIONS: There was residual sedation effect present from prior procedure, MAC sedation, administered by CRNA, and Propofol (Diprivan) 70 mg IV TOPICAL ANESTHETIC:  DESCRIPTION OF PROCEDURE: After the risks benefits and alternatives of the procedure were thoroughly explained, informed consent was obtained.  The LB [id number] endoscope was introduced through the mouth and advanced to the third portion of the duodenum. Without limitations.  The instrument was slowly withdrawn as the mucosa was fully examined.      There was an early stricture at the GE junction.  The 9 mm gastroscope easily traversed the stricture.  A 5 cm hiatal hernia (sliding) was present. There appeared to be a few circular rings in the esophagus and a possible longitudinal furrow.  Multiple biopsies were taken. There was an early stricture at the GE junction.  The 9 mm gastroscope easily traversed the stricture.  A 5 cm hiatal hernia (sliding) was present. There appeared to be a few circular rings in the esophagus and a possible longitudinal furrow.  Multiple biopsies were taken.   The remainder of the upper endoscopy exam was otherwise normal. Retroflexed views revealed no abnormalities.     The scope was then withdrawn from the patient and the procedure completed.  COMPLICATIONS: There were no complications. ENDOSCOPIC IMPRESSION: 1.   esophageal stricture - status post Maloney dilation 2.  rule out eosinophilic esophagitis  RECOMMENDATIONS: 1.  continue dexilant 2.  await biopsy findings 3.  office visit  6-8 week REPEAT EXAM:  eSigned:  Inda Castle, MD 04/05/2014 3:44 PM   CC:

## 2014-04-05 NOTE — Patient Instructions (Addendum)
YOU HAD AN ENDOSCOPIC PROCEDURE TODAY AT [facility]: Refer to the procedure report that was given to you for any specific questions about what was found during the examination.  If the procedure report does not answer your questions, please call your gastroenterologist to clarify.  If you requested that your care partner not be given the details of your procedure findings, then the procedure report has been included in a sealed envelope for you to review at your convenience later.  YOU SHOULD EXPECT: Some feelings of bloating in the abdomen. Passage of more gas than usual.  Walking can help get rid of the air that was put into your GI tract during the procedure and reduce the bloating. If you had a lower endoscopy (such as a colonoscopy or flexible sigmoidoscopy) you may notice spotting of blood in your stool or on the toilet paper. If you underwent a bowel prep for your procedure, then you may not have a normal bowel movement for a few days.  DIET: FOLLOW DILATATION DIET GIVEN TO YOU TODAY  -LIQUIDS ONLY UNTIL 5:30 TODAY ,SOFT FOODS THE REST OF TODAY ,USUAL DIET TOMORROW    ACTIVITY: Your care partner should take you home directly after the procedure.  You should plan to take it easy, moving slowly for the rest of the day.  You can resume normal activity the day after the procedure however you should NOT DRIVE or use heavy machinery for 24 hours (because of the sedation medicines used during the test).    SYMPTOMS TO REPORT IMMEDIATELY: A gastroenterologist can be reached at any hour.  During normal business hours, 8:30 AM to 5:00 PM Monday through Friday, call [phone].  After hours and on weekends, please call the GI answering service at [phone] who will take a message and have the physician on call contact you.   Following lower endoscopy (colonoscopy or flexible sigmoidoscopy):  Excessive amounts of blood in the stool  Significant tenderness or worsening of  abdominal pains  Swelling of the abdomen that is new, acute  Fever of 100F or higher  Following upper endoscopy (EGD)  Vomiting of blood or coffee ground material  New chest pain or pain under the shoulder blades  Painful or persistently difficult swallowing  New shortness of breath  Fever of 100F or higher  Black, tarry-looking stools  FOLLOW UP: If any biopsies were taken you will be contacted by phone or by letter within the next 1-3 weeks.  Call your gastroenterologist if you have not heard about the biopsies in 3 weeks.  Our staff will call the home number listed on your records the next business day following your procedure to check on you and address any questions or concerns that you may have at that time regarding the information given to you following your procedure. This is a courtesy call and so if there is no answer at the home number and we have not heard from you through the emergency physician on call, we will assume that you have returned to your regular daily activities without incident.  SIGNATURES/CONFIDENTIALITY: You and/or your care partner have signed paperwork which will be entered into your electronic medical record.  These signatures attest to the fact that that the information above on your After Visit Summary has been reviewed and is understood.  Full responsibility of the confidentiality of this discharge information lies with you and/or your care-partner.  CONTINUE DEXILANT  MAKE OFFICE APPOINTMENT WITH DR Deatra Ina FOR 6-8 WEEKS  INFORMATION ON POLYPS ,DIVERTICULOSIS ,AND HEMORRHOIDS GIVEN TO YOU TODAY  MAY USE OVER THE COUNTER HEMORRHOIDAL CREAM SUCH AS PREPARATION -H OR STORE BRAND AS DIRECTED PER DR KAPLAN  INFORMATION ON [facility] SYSTEM GIVEN TO YOU PER DR Deatra Ina

## 2014-04-05 NOTE — Progress Notes (Signed)
Report to pacu rn, vss, bbs=clear 

## 2014-04-05 NOTE — Progress Notes (Signed)
Called to room to assist during endoscopic procedure.  Patient ID and intended procedure confirmed with present staff. Received instructions for my participation in the procedure from the performing physician.  

## 2014-04-06 ENCOUNTER — Telehealth: Payer: Self-pay | Admitting: *Deleted

## 2014-04-06 NOTE — Telephone Encounter (Signed)
  Follow up Call-  Call back number 04/05/2014  Post procedure Call Back phone  # [phone]  Permission to leave phone message Yes    [facility]

## 2014-04-11 ENCOUNTER — Encounter: Payer: Self-pay | Admitting: Gastroenterology

## 2014-04-11 ENCOUNTER — Telehealth: Payer: Self-pay

## 2014-04-11 MED ORDER — FLUTICASONE PROPIONATE HFA 220 MCG/ACT IN AERO: INHALATION_SPRAY | RESPIRATORY_TRACT | Status: DC

## 2014-04-11 MED ORDER — OMEPRAZOLE 40 MG PO CPDR: 40.0000 mg | DELAYED_RELEASE_CAPSULE | Freq: Every day | ORAL | Status: DC

## 2014-04-11 NOTE — Telephone Encounter (Signed)
Omeprazole sent 

## 2014-04-11 NOTE — Telephone Encounter (Signed)
yes

## 2014-04-11 NOTE — Telephone Encounter (Signed)
Dr Erica Morris the pt can not afford dexilant is it ok to send omeprazole?

## 2014-04-19 ENCOUNTER — Telehealth: Payer: Self-pay | Admitting: Gastroenterology

## 2014-04-19 ENCOUNTER — Other Ambulatory Visit: Payer: Self-pay | Admitting: Internal Medicine

## 2014-04-19 MED ORDER — ESOMEPRAZOLE MAGNESIUM 40 MG PO CPDR: 40.0000 mg | DELAYED_RELEASE_CAPSULE | Freq: Every day | ORAL | Status: DC

## 2014-04-19 NOTE — Telephone Encounter (Signed)
Called patient to ask her what medications she has tried  She has not tried Nexium will send this into her pharmacy Pt aware

## 2014-04-20 ENCOUNTER — Telehealth: Payer: Self-pay | Admitting: Internal Medicine

## 2014-04-20 MED ORDER — CLONAZEPAM 1 MG PO TABS: 1.0000 mg | ORAL_TABLET | Freq: Two times a day (BID) | ORAL | Status: DC | PRN

## 2014-04-20 NOTE — Telephone Encounter (Signed)
Rx called into pharmacy. Left detailed message on machine for patient explaining that she will need an office visit for more refills.

## 2014-04-20 NOTE — Telephone Encounter (Addendum)
Pt needs refill on clonazepam [facility]. Pt has one pill left

## 2014-05-22 ENCOUNTER — Ambulatory Visit: Payer: Medicare HMO | Admitting: Gastroenterology

## 2014-05-24 ENCOUNTER — Other Ambulatory Visit: Payer: Self-pay | Admitting: Internal Medicine

## 2014-05-25 ENCOUNTER — Telehealth: Payer: Self-pay | Admitting: Internal Medicine

## 2014-05-25 MED ORDER — CLONAZEPAM 1 MG PO TABS: 1.0000 mg | ORAL_TABLET | Freq: Two times a day (BID) | ORAL | Status: DC | PRN

## 2014-05-25 NOTE — Telephone Encounter (Signed)
Pt notified Rx called into pharmacy 

## 2014-05-25 NOTE — Telephone Encounter (Signed)
Pt is requesting a refill of clonazePAM (KLONOPIN) 1 MG tablet Pt  States she will have to wait to see her daughter's schedule at work before she can schedule a fup. That will be Sunday.   Pt cannot come until next month. Pt states she is going through a lot now and has to take 2 tabs/day.  Pt has one pill left and states hif she doesn't get this rx she will be in rehab. [facility] [address] [facility]

## 2014-05-31 ENCOUNTER — Ambulatory Visit: Payer: Medicare HMO | Admitting: Internal Medicine

## 2014-06-04 ENCOUNTER — Ambulatory Visit (INDEPENDENT_AMBULATORY_CARE_PROVIDER_SITE_OTHER): Payer: Medicare HMO | Admitting: Internal Medicine

## 2014-06-04 ENCOUNTER — Encounter: Payer: Self-pay | Admitting: Internal Medicine

## 2014-06-04 VITALS — BP 110/72 | HR 57 | Temp 98.4°F | Resp 20 | Ht 64.0 in | Wt 190.0 lb

## 2014-06-04 DIAGNOSIS — E785 Hyperlipidemia, unspecified: Secondary | ICD-10-CM

## 2014-06-04 DIAGNOSIS — Z860100 Personal history of colon polyps, unspecified: Secondary | ICD-10-CM

## 2014-06-04 DIAGNOSIS — K5909 Other constipation: Secondary | ICD-10-CM

## 2014-06-04 DIAGNOSIS — K5900 Constipation, unspecified: Secondary | ICD-10-CM

## 2014-06-04 DIAGNOSIS — K219 Gastro-esophageal reflux disease without esophagitis: Secondary | ICD-10-CM

## 2014-06-04 DIAGNOSIS — I10 Essential (primary) hypertension: Secondary | ICD-10-CM

## 2014-06-04 DIAGNOSIS — Z860101 Personal history of adenomatous and serrated colon polyps: Secondary | ICD-10-CM | POA: Insufficient documentation

## 2014-06-04 HISTORY — DX: Personal history of colon polyps, unspecified: Z86.0100

## 2014-06-04 HISTORY — DX: Personal history of colonic polyps: Z86.010

## 2014-06-04 LAB — BASIC METABOLIC PANEL
BUN: 14 mg/dL (ref 6–23)
CO2: 28 mEq/L (ref 19–32)
Calcium: 9.2 mg/dL (ref 8.4–10.5)
Chloride: 100 mEq/L (ref 96–112)
Creatinine, Ser: 0.8 mg/dL (ref 0.4–1.2)
GFR: 75 mL/min (ref >60.00)
Glucose, Bld: 78 mg/dL (ref 70–99)
Potassium: 3 mEq/L — ABNORMAL LOW (ref 3.5–5.1)
Sodium: 139 mEq/L (ref 135–145)

## 2014-06-04 LAB — LIPID PANEL
Cholesterol: 250 mg/dL — ABNORMAL HIGH (ref 0–200)
HDL: 42.1 mg/dL (ref >39.00)
LDL Cholesterol: 159 mg/dL — ABNORMAL HIGH (ref 0–99)
NonHDL: 207.9
Total CHOL/HDL Ratio: 6
Triglycerides: 244 mg/dL — ABNORMAL HIGH (ref 0.0–149.0)
VLDL: 48.8 mg/dL — ABNORMAL HIGH (ref 0.0–40.0)

## 2014-06-04 LAB — TSH: TSH: 3.82 u[IU]/mL (ref 0.35–4.50)

## 2014-06-04 MED ORDER — TRIAMCINOLONE ACETONIDE 0.1 % EX CREA: 1.0000 "application " | TOPICAL_CREAM | Freq: Two times a day (BID) | CUTANEOUS | Status: DC

## 2014-06-04 NOTE — Progress Notes (Signed)
Subjective:    Patient ID: Erica Morris, female    DOB: Sep 07, 1946, 68 y.o.   MRN: [id number]  HPI  68 year old patient who is seen today in followup.  She has not been seen in over one year.  She has a history of hypertension, chronic constipation, gastroesophageal reflux disease. Complaints include sore throat, onset yesterday.  She also has a two-week history of rash involving her lower extremities.  These are quite pruritic.  Social history.  The patient lost her husband and mother both over the past 2 years.  Patient has had a recent colonoscopy.  Past Medical History  Diagnosis Date  . ABDOMINAL PAIN, RECURRENT 03/29/2008  . ANXIETY 10/07/2007  . Candidiasis of mouth 05/30/2010  . GERD 07/25/2007  . HYPERTENSION 07/25/2007  . Hernia, hiatal   . Stroke     History   Social History  . Marital Status: Widowed    Spouse Name: N/A    Number of Children: N/A  . Years of Education: N/A   Occupational History  . Not on file.   Social History Main Topics  . Smoking status: Never Smoker   . Smokeless tobacco: Never Used  . Alcohol Use: Yes     Comment: occasional  . Drug Use: No  . Sexual Activity: No   Other Topics Concern  . Not on file   Social History Narrative  . No narrative on file    Past Surgical History  Procedure Laterality Date  . Abdominal hysterectomy    . Knee arthroscopy    . Breast surgery      bx    Family History  Problem Relation Age of Onset  . Kidney disease Neg Hx   . Stroke Neg Hx   . Diabetes Neg Hx     Allergies  Allergen Reactions  . Codeine Phosphate Itching  . Morphine And Related Itching    Burning sensation and itchiness with IV morphine    Current Outpatient Prescriptions on File Prior to Visit  Medication Sig Dispense Refill  . aspirin EC 81 MG tablet Take 81 mg by mouth every morning.       . clonazePAM (KLONOPIN) 1 MG tablet Take 1 tablet (1 mg total) by mouth 2 (two) times daily as needed for anxiety.  60 tablet  1   . FLUoxetine (PROZAC) 20 MG capsule Take 20 mg by mouth daily.      . hydrochlorothiazide (HYDRODIURIL) 25 MG tablet Take 25 mg by mouth daily.      . fluticasone (FLOVENT HFA) 220 MCG/ACT inhaler 2 puffs swallowed twice daily rinse mouth after use do not swallow water and do not eat or drink 30 minutes after dose.  1 Inhaler  0   No current facility-administered medications on file prior to visit.    BP 110/72  Pulse 57  Temp(Src) 98.4 F (36.9 C) (Oral)  Resp 20  Ht 5\' 4"  (1.626 m)  Wt 190 lb (86.183 kg)  BMI 32.60 kg/m2  SpO2 98%      Review of Systems  Constitutional: Negative.   HENT: Negative for congestion, dental problem, hearing loss, rhinorrhea, sinus pressure, sore throat and tinnitus.   Eyes: Negative for pain, discharge and visual disturbance.  Respiratory: Negative for cough and shortness of breath.   Cardiovascular: Negative for chest pain, palpitations and leg swelling.  Gastrointestinal: Positive for constipation. Negative for nausea, vomiting, abdominal pain, diarrhea, blood in stool and abdominal distention.  Genitourinary: Negative for dysuria, urgency, frequency,  hematuria, flank pain, vaginal bleeding, vaginal discharge, difficulty urinating, vaginal pain and pelvic pain.  Musculoskeletal: Negative for arthralgias, gait problem and joint swelling.  Skin: Positive for rash.  Neurological: Negative for dizziness, syncope, speech difficulty, weakness, numbness and headaches.  Hematological: Negative for adenopathy.  Psychiatric/Behavioral: Negative for behavioral problems, dysphoric mood and agitation. The patient is nervous/anxious.        Objective:   Physical Exam  Constitutional: She is oriented to person, place, and time. She appears well-developed and well-nourished.  Blood pressure 110/70  HENT:  Head: Normocephalic.  Right Ear: External ear normal.  Left Ear: External ear normal.  Oropharynx mildly injected  Eyes: Conjunctivae and EOM are  normal. Pupils are equal, round, and reactive to light.  Neck: Normal range of motion. Neck supple. No thyromegaly present.  Cardiovascular: Normal rate, regular rhythm, normal heart sounds and intact distal pulses.   Pulmonary/Chest: Effort normal and breath sounds normal.  Abdominal: Soft. Bowel sounds are normal. She exhibits no mass. There is no tenderness.  Musculoskeletal: Normal range of motion.  Lymphadenopathy:    She has no cervical adenopathy.  Neurological: She is alert and oriented to person, place, and time.  Skin: Skin is warm and dry. Rash noted.  Erythematous maculopapular rash over the lower extremities Some excoriations in the postauricular area on the right  Psychiatric: She has a normal mood and affect. Her behavior is normal.          Assessment & Plan:   Mild pharyngitis Hypertension well controlled Chronic constipation GERD Dermatitis, lower extremities.  Appears to be a contact dermatitis.  We'll treat with triamcinolone

## 2014-06-04 NOTE — Patient Instructions (Signed)
Limit your sodium (Salt) intake  Avoids foods high in acid such as tomatoes citrus juices, and spicy foods.  Avoid eating within two hours of lying down or before exercising.  Do not overheat.  Try smaller more frequent meals.  If symptoms persist, elevate the head of her bed 12 inches while sleeping.    It is important that you exercise regularly, at least 20 minutes 3 to 4 times per week.  If you develop chest pain or shortness of breath seek  medical attention.  You need to lose weight.  Consider a lower calorie diet and regular exercise.

## 2014-06-04 NOTE — Progress Notes (Signed)
Pre visit review using our clinic review tool, if applicable. No additional management support is needed unless otherwise documented below in the visit note. 

## 2014-06-05 ENCOUNTER — Other Ambulatory Visit: Payer: Self-pay | Admitting: *Deleted

## 2014-06-05 ENCOUNTER — Ambulatory Visit: Payer: Medicare HMO | Admitting: Internal Medicine

## 2014-06-05 MED ORDER — POTASSIUM CHLORIDE CRYS ER 20 MEQ PO TBCR: 20.0000 meq | EXTENDED_RELEASE_TABLET | Freq: Every day | ORAL | Status: DC

## 2014-06-07 ENCOUNTER — Telehealth: Payer: Self-pay | Admitting: Internal Medicine

## 2014-06-07 NOTE — Telephone Encounter (Signed)
Pt would like blood work results °

## 2014-06-08 NOTE — Telephone Encounter (Signed)
Left message on voicemail to call office.  

## 2014-06-08 NOTE — Telephone Encounter (Signed)
Pt calling back wanting results 

## 2014-06-11 NOTE — Telephone Encounter (Signed)
Spoke to pt told her I left a message on 7/7 that her Potassium was low and Rx was sent to pharmacy. Pt said yes, but she has not gotten it yet due to money. Told her try to get as soon as she can and Cholesterol was elevated again and Triglycerides, need to work on diet. Pt verbalized understanding.

## 2014-06-25 ENCOUNTER — Telehealth: Payer: Self-pay | Admitting: Gastroenterology

## 2014-06-26 MED ORDER — PANTOPRAZOLE SODIUM 40 MG PO TBEC
40.0000 mg | DELAYED_RELEASE_TABLET | Freq: Every day | ORAL | Status: DC
Start: 2014-06-26 — End: 2014-10-31

## 2014-06-26 NOTE — Telephone Encounter (Signed)
Pt aware and script sent to the pharmacy. 

## 2014-06-26 NOTE — Telephone Encounter (Signed)
DC omeprazole.  Begin Protonix 40 mg daily

## 2014-06-26 NOTE — Telephone Encounter (Signed)
Left message for pt to call back  °

## 2014-06-26 NOTE — Telephone Encounter (Signed)
Pt states that everything she eats and drinks makes her have heartburn. Pt wants something called in for her heartburn but requests it not be very expensive. Please advise.

## 2014-07-31 ENCOUNTER — Telehealth: Payer: Self-pay | Admitting: Internal Medicine

## 2014-07-31 MED ORDER — CLONAZEPAM 1 MG PO TABS: 1.0000 mg | ORAL_TABLET | Freq: Two times a day (BID) | ORAL | Status: DC | PRN

## 2014-07-31 NOTE — Telephone Encounter (Signed)
Pt request refill of the following: clonazePAM (KLONOPIN) 1 MG tablet       Phamacy:  [facility]

## 2014-07-31 NOTE — Telephone Encounter (Signed)
Pt notified Rx called into pharmacy 

## 2014-08-10 ENCOUNTER — Other Ambulatory Visit: Payer: Self-pay | Admitting: Internal Medicine

## 2014-08-13 ENCOUNTER — Telehealth: Payer: Self-pay | Admitting: Internal Medicine

## 2014-08-13 MED ORDER — MOMETASONE FUROATE 0.1 % EX CREA: 1.0000 "application " | TOPICAL_CREAM | Freq: Two times a day (BID) | CUTANEOUS | Status: DC

## 2014-08-13 NOTE — Telephone Encounter (Signed)
Please advise 

## 2014-08-13 NOTE — Telephone Encounter (Signed)
Left detailed message new Rx sent to pharmacy.

## 2014-08-13 NOTE — Telephone Encounter (Signed)
Generic Elocon  Cream 0.1 %   60 g.  Apply twice daily

## 2014-08-13 NOTE — Telephone Encounter (Signed)
Pt was seen on 7-6 and given triamcinolone cream for rash on legs. Pt stated med is not working and requesting another med call into [facility] [address]

## 2014-09-10 ENCOUNTER — Other Ambulatory Visit: Payer: Self-pay | Admitting: Internal Medicine

## 2014-10-10 ENCOUNTER — Encounter (INDEPENDENT_AMBULATORY_CARE_PROVIDER_SITE_OTHER): Payer: Medicare HMO | Admitting: Ophthalmology

## 2014-10-15 ENCOUNTER — Encounter (INDEPENDENT_AMBULATORY_CARE_PROVIDER_SITE_OTHER): Payer: Medicare HMO | Admitting: Ophthalmology

## 2014-10-15 ENCOUNTER — Other Ambulatory Visit: Payer: Self-pay | Admitting: Internal Medicine

## 2014-10-15 DIAGNOSIS — H35343 Macular cyst, hole, or pseudohole, bilateral: Secondary | ICD-10-CM

## 2014-10-15 DIAGNOSIS — H35033 Hypertensive retinopathy, bilateral: Secondary | ICD-10-CM

## 2014-10-15 DIAGNOSIS — I10 Essential (primary) hypertension: Secondary | ICD-10-CM

## 2014-10-15 DIAGNOSIS — H2512 Age-related nuclear cataract, left eye: Secondary | ICD-10-CM

## 2014-10-15 DIAGNOSIS — H43812 Vitreous degeneration, left eye: Secondary | ICD-10-CM

## 2014-10-31 ENCOUNTER — Other Ambulatory Visit: Payer: Self-pay | Admitting: Gastroenterology

## 2014-11-06 ENCOUNTER — Other Ambulatory Visit: Payer: Self-pay | Admitting: Internal Medicine

## 2014-12-17 ENCOUNTER — Telehealth: Payer: Self-pay | Admitting: Gastroenterology

## 2014-12-18 NOTE — Telephone Encounter (Signed)
Patient has hiatal hernia. She takes Pantoprazole 40mg , HCTZ and 2 "nerve pills" daily. She has been experiencing reflux for 2 weeks. She admits she may have not been eating right. Please advise.

## 2014-12-18 NOTE — Telephone Encounter (Signed)
Instruct her on anti-reflux measures including diet.  She may take antacids as needed.  If not improved in 2-3 days call back

## 2014-12-18 NOTE — Telephone Encounter (Signed)
Left message

## 2014-12-19 NOTE — Telephone Encounter (Signed)
advised

## 2015-01-04 ENCOUNTER — Other Ambulatory Visit: Payer: Self-pay | Admitting: Internal Medicine

## 2015-01-09 ENCOUNTER — Other Ambulatory Visit: Payer: Self-pay | Admitting: Internal Medicine

## 2015-02-02 ENCOUNTER — Other Ambulatory Visit: Payer: Self-pay | Admitting: Internal Medicine

## 2015-02-05 ENCOUNTER — Other Ambulatory Visit: Payer: Self-pay | Admitting: Internal Medicine

## 2015-02-05 NOTE — Telephone Encounter (Signed)
Approved.  Rx originally sent to pharmacy on 02/02/15 but transmission failed.  Rx re sent by e-scribe.

## 2015-02-20 ENCOUNTER — Ambulatory Visit (INDEPENDENT_AMBULATORY_CARE_PROVIDER_SITE_OTHER): Payer: Commercial Managed Care - HMO | Admitting: Internal Medicine

## 2015-02-20 ENCOUNTER — Encounter: Payer: Self-pay | Admitting: Internal Medicine

## 2015-02-20 ENCOUNTER — Other Ambulatory Visit: Payer: Self-pay | Admitting: *Deleted

## 2015-02-20 VITALS — BP 120/80 | HR 60 | Temp 98.1°F | Resp 20 | Ht 64.0 in | Wt 195.0 lb

## 2015-02-20 DIAGNOSIS — I10 Essential (primary) hypertension: Secondary | ICD-10-CM | POA: Diagnosis not present

## 2015-02-20 DIAGNOSIS — E876 Hypokalemia: Secondary | ICD-10-CM | POA: Insufficient documentation

## 2015-02-20 DIAGNOSIS — F411 Generalized anxiety disorder: Secondary | ICD-10-CM

## 2015-02-20 DIAGNOSIS — C4491 Basal cell carcinoma of skin, unspecified: Secondary | ICD-10-CM

## 2015-02-20 LAB — BASIC METABOLIC PANEL
BUN: 12 mg/dL (ref 6–23)
CO2: 33 mEq/L — ABNORMAL HIGH (ref 19–32)
Calcium: 9.2 mg/dL (ref 8.4–10.5)
Chloride: 101 mEq/L (ref 96–112)
Creatinine, Ser: 0.94 mg/dL (ref 0.40–1.20)
GFR: 63.03 mL/min (ref >60.00)
Glucose, Bld: 100 mg/dL — ABNORMAL HIGH (ref 70–99)
Potassium: 3.3 mEq/L — ABNORMAL LOW (ref 3.5–5.1)
Sodium: 141 mEq/L (ref 135–145)

## 2015-02-20 MED ORDER — LISINOPRIL 20 MG PO TABS: 20.0000 mg | ORAL_TABLET | Freq: Every day | ORAL | Status: DC

## 2015-02-20 MED ORDER — CLONAZEPAM 1 MG PO TABS: 1.0000 mg | ORAL_TABLET | Freq: Two times a day (BID) | ORAL | Status: DC | PRN

## 2015-02-20 MED ORDER — FLUOXETINE HCL 40 MG PO CAPS: 40.0000 mg | ORAL_CAPSULE | Freq: Every day | ORAL | Status: DC

## 2015-02-20 NOTE — Patient Instructions (Addendum)
   Limit your sodium (Salt) intake    It is important that you exercise regularly, at least 20 minutes 3 to 4 times per week.  If you develop chest pain or shortness of breath seek  medical attention.  Please check your blood pressure on a regular basis.  If it is consistently greater than 150/90, please make an office appointment.  Dermatology referral as discussed

## 2015-02-20 NOTE — Progress Notes (Signed)
Pre visit review using our clinic review tool, if applicable. No additional management support is needed unless otherwise documented below in the visit note. 

## 2015-02-21 ENCOUNTER — Encounter: Payer: Self-pay | Admitting: Internal Medicine

## 2015-02-21 ENCOUNTER — Telehealth: Payer: Self-pay | Admitting: Internal Medicine

## 2015-02-21 ENCOUNTER — Telehealth: Payer: Self-pay

## 2015-02-21 NOTE — Telephone Encounter (Signed)
Pt calling for lab results, please advise. 

## 2015-02-21 NOTE — Telephone Encounter (Signed)
emmi emailed °

## 2015-02-21 NOTE — Telephone Encounter (Signed)
Please call/notify patient that lab/test/procedure is normal except for potassium slightly low.  This will normalize on new blood pressure medication.  However

## 2015-02-21 NOTE — Telephone Encounter (Signed)
Pt called concerning her blood pressure pill. Pt states her blood pressure pill was not suppose to change.  Advised pt that per the note from the visit yesterday: Anxiety, depression. We'll increase Prozac to 40 mg daily Hypertension, well-controlled. Due to a history of hypokalemia. Will discontinue thiazide diuretics and substitute lisinopril. Schedule CPX Lesion right postauricular area, rule out BCE. Will set up for dermatology evaluation  Advised pt of change of medication and pt verbalized understanding. Pt would like lab results as well.

## 2015-02-21 NOTE — Progress Notes (Signed)
Subjective:    Patient ID: Erica Morris, female    DOB: [birth year], 69 y.o.   MRN: [id number]  HPI 69 year old patient who is seen here infrequently.  She does have a history of hypertension which has been managed with thiazide diuretics.  She has had a history of hypokalemia but has not been very compliant with potassium supplementation. She has a history of anxiety disorder.  Fairly recently.  She has lost several family members, is widowed and has also lost her house and presently living with a sister She complains of worsening anxiety and depression She also complains of a nonhealing lesion in the right postauricular area  Past Medical History  Diagnosis Date  . ABDOMINAL PAIN, RECURRENT 03/29/2008  . ANXIETY 10/07/2007  . Candidiasis of mouth 05/30/2010  . GERD 07/25/2007  . HYPERTENSION 07/25/2007  . Hernia, hiatal   . Stroke     History   Social History  . Marital Status: Widowed    Spouse Name: N/A  . Number of Children: N/A  . Years of Education: N/A   Occupational History  . Not on file.   Social History Main Topics  . Smoking status: Never Smoker   . Smokeless tobacco: Never Used  . Alcohol Use: Yes     Comment: occasional  . Drug Use: No  . Sexual Activity: No   Other Topics Concern  . Not on file   Social History Narrative    Past Surgical History  Procedure Laterality Date  . Abdominal hysterectomy    . Knee arthroscopy    . Breast surgery      bx    Family History  Problem Relation Age of Onset  . Kidney disease Neg Hx   . Stroke Neg Hx   . Diabetes Neg Hx     Allergies  Allergen Reactions  . Codeine Phosphate Itching  . Morphine And Related Itching    Burning sensation and itchiness with IV morphine    Current Outpatient Prescriptions on File Prior to Visit  Medication Sig Dispense Refill  . aspirin EC 81 MG tablet Take 81 mg by mouth every morning.     . pantoprazole (PROTONIX) 40 MG tablet TAKE 1 TABLET BY MOUTH EVERY DAY 30  tablet 3  . fluticasone (FLOVENT HFA) 220 MCG/ACT inhaler 2 puffs swallowed twice daily rinse mouth after use do not swallow water and do not eat or drink 30 minutes after dose. (Patient not taking: Reported on 02/20/2015) 1 Inhaler 0   No current facility-administered medications on file prior to visit.    BP 120/80 mmHg  Pulse 60  Temp(Src) 98.1 F (36.7 C) (Oral)  Resp 20  Ht 5\' 4"  (1.626 m)  Wt 195 lb (88.451 kg)  BMI 33.46 kg/m2  SpO2 98%      Review of Systems  Constitutional: Negative.   HENT: Negative for congestion, dental problem, hearing loss, rhinorrhea, sinus pressure, sore throat and tinnitus.   Eyes: Negative for pain, discharge and visual disturbance.  Respiratory: Negative for cough and shortness of breath.   Cardiovascular: Negative for chest pain, palpitations and leg swelling.  Gastrointestinal: Negative for nausea, vomiting, abdominal pain, diarrhea, constipation, blood in stool and abdominal distention.  Genitourinary: Negative for dysuria, urgency, frequency, hematuria, flank pain, vaginal bleeding, vaginal discharge, difficulty urinating, vaginal pain and pelvic pain.  Musculoskeletal: Negative for joint swelling, arthralgias and gait problem.  Skin: Positive for wound. Negative for rash.  Neurological: Negative for dizziness, syncope, speech difficulty, weakness,  numbness and headaches.  Hematological: Negative for adenopathy.  Psychiatric/Behavioral: Negative for behavioral problems, dysphoric mood and agitation. The patient is nervous/anxious.        Objective:   Physical Exam  Constitutional: She is oriented to person, place, and time. She appears well-developed and well-nourished.  Blood pressure 120/78  HENT:  Head: Normocephalic.  Right Ear: External ear normal.  Left Ear: External ear normal.  Mouth/Throat: Oropharynx is clear and moist.  Eyes: Conjunctivae and EOM are normal. Pupils are equal, round, and reactive to light.  Neck: Normal  range of motion. Neck supple. No thyromegaly present.  Cardiovascular: Normal rate, regular rhythm, normal heart sounds and intact distal pulses.   Pulmonary/Chest: Effort normal and breath sounds normal.  Abdominal: Soft. Bowel sounds are normal. She exhibits no mass. There is no tenderness.  Musculoskeletal: Normal range of motion.  Lymphadenopathy:    She has no cervical adenopathy.  Neurological: She is alert and oriented to person, place, and time.  Skin: Skin is warm and dry. No rash noted.  Small ulcerated lesion in the right postauricular area  Psychiatric: She has a normal mood and affect. Her behavior is normal.          Assessment & Plan:   Anxiety, depression.  We'll increase Prozac to 40 mg daily Hypertension, well-controlled.  Due to a history of hypokalemia.  Will discontinue thiazide diuretics and substitute lisinopril.  Schedule CPX Lesion right postauricular area, rule out BCE.  Will set up for dermatology evaluation  CPX 3 months

## 2015-02-25 ENCOUNTER — Telehealth: Payer: Self-pay | Admitting: Gastroenterology

## 2015-02-25 MED ORDER — PANTOPRAZOLE SODIUM 40 MG PO TBEC: 40.0000 mg | DELAYED_RELEASE_TABLET | Freq: Every day | ORAL | Status: DC

## 2015-02-25 NOTE — Telephone Encounter (Signed)
Called patient to inform med sent to pharmacy  

## 2015-02-25 NOTE — Telephone Encounter (Signed)
Left message on voicemail to call office.  

## 2015-02-25 NOTE — Telephone Encounter (Signed)
Spoke to pt, told her labs normal except potassium slightly low but this will normalize with new blood pressure medication per Dr.K. Pt verbalized understanding.

## 2015-02-26 ENCOUNTER — Telehealth: Payer: Self-pay | Admitting: Internal Medicine

## 2015-02-26 MED ORDER — LOSARTAN POTASSIUM 100 MG PO TABS: 100.0000 mg | ORAL_TABLET | Freq: Every day | ORAL | Status: DC

## 2015-02-26 NOTE — Telephone Encounter (Signed)
Pt is calling to let md know the lisinopril is causing her to cough. Please advise

## 2015-02-26 NOTE — Telephone Encounter (Signed)
Spoke to pt, told her to stop Lisinopril and new Rx sent to pharmacy for Losartan 100 mg daily. Pt verbalized understanding.

## 2015-02-26 NOTE — Telephone Encounter (Signed)
Discontinue lisinopril Substitute losartan 100 mg #60 one daily

## 2015-03-04 MED ORDER — AMLODIPINE BESYLATE 2.5 MG PO TABS: 2.5000 mg | ORAL_TABLET | Freq: Every day | ORAL | Status: DC

## 2015-03-04 NOTE — Telephone Encounter (Signed)
Please call in a new prescription for amlodipine 2.5 milligrams #30 one daily Blood pressure recheck in one month

## 2015-03-04 NOTE — Addendum Note (Signed)
Addended by: Marian Sorrow on: 03/04/2015 05:09 PM   Modules accepted: Orders, Medications

## 2015-03-04 NOTE — Telephone Encounter (Signed)
Patient states the Losartan is making her cough and she wakes up with a headache.  She would like a callback.

## 2015-03-04 NOTE — Telephone Encounter (Signed)
Spoke to pt, told her to stop Losartan and new Rx for Amlodipine 2.5 mg one tablet daily sent to pharmacy and schedule blood pressure check in one month per Dr.K. Pt verbalized understanding.

## 2015-03-04 NOTE — Telephone Encounter (Signed)
Please see message and advise 

## 2015-04-15 ENCOUNTER — Ambulatory Visit (INDEPENDENT_AMBULATORY_CARE_PROVIDER_SITE_OTHER): Payer: Commercial Managed Care - HMO | Admitting: Ophthalmology

## 2015-04-15 ENCOUNTER — Telehealth: Payer: Self-pay | Admitting: Internal Medicine

## 2015-04-15 DIAGNOSIS — C4491 Basal cell carcinoma of skin, unspecified: Secondary | ICD-10-CM

## 2015-04-15 DIAGNOSIS — H269 Unspecified cataract: Secondary | ICD-10-CM

## 2015-04-15 NOTE — Telephone Encounter (Signed)
Left message on voicemail to call office.  

## 2015-04-15 NOTE — Telephone Encounter (Signed)
Pt need a referral to see Dr Tempie Hoist for 6 month fup   Pt also need a referral to [place] with Dr Redmond Pulling appt is schedule for 04/18/15 2pm

## 2015-04-16 NOTE — Telephone Encounter (Signed)
Left message on voicemail to call office. Need to know what she is seeing Dr. Tempie Hoist for?

## 2015-04-16 NOTE — Addendum Note (Signed)
Addended by: Marian Sorrow on: 04/16/2015 04:20 PM   Modules accepted: Orders

## 2015-04-17 NOTE — Telephone Encounter (Signed)
Orders for referrals done.

## 2015-04-17 NOTE — Addendum Note (Signed)
Addended by: Marian Sorrow on: 04/17/2015 10:03 AM   Modules accepted: Orders

## 2015-04-23 ENCOUNTER — Telehealth: Payer: Self-pay | Admitting: *Deleted

## 2015-04-23 NOTE — Telephone Encounter (Signed)
Left message for pt to call back.  Need to know if pt had mammogram, exact date.  If not, okay to [facility]?

## 2015-04-27 ENCOUNTER — Other Ambulatory Visit: Payer: Self-pay | Admitting: Internal Medicine

## 2015-05-01 ENCOUNTER — Ambulatory Visit (INDEPENDENT_AMBULATORY_CARE_PROVIDER_SITE_OTHER): Payer: Commercial Managed Care - HMO | Admitting: Ophthalmology

## 2015-05-01 DIAGNOSIS — H43813 Vitreous degeneration, bilateral: Secondary | ICD-10-CM | POA: Diagnosis not present

## 2015-05-01 DIAGNOSIS — I10 Essential (primary) hypertension: Secondary | ICD-10-CM

## 2015-05-01 DIAGNOSIS — H35033 Hypertensive retinopathy, bilateral: Secondary | ICD-10-CM | POA: Diagnosis not present

## 2015-05-01 DIAGNOSIS — H35343 Macular cyst, hole, or pseudohole, bilateral: Secondary | ICD-10-CM | POA: Diagnosis not present

## 2015-05-11 ENCOUNTER — Other Ambulatory Visit: Payer: Self-pay | Admitting: Internal Medicine

## 2015-06-27 ENCOUNTER — Other Ambulatory Visit: Payer: Self-pay | Admitting: Gastroenterology

## 2015-07-25 ENCOUNTER — Other Ambulatory Visit: Payer: Self-pay | Admitting: Gastroenterology

## 2015-07-25 ENCOUNTER — Other Ambulatory Visit: Payer: Self-pay | Admitting: Internal Medicine

## 2015-08-27 ENCOUNTER — Ambulatory Visit (INDEPENDENT_AMBULATORY_CARE_PROVIDER_SITE_OTHER): Payer: Commercial Managed Care - HMO | Admitting: Internal Medicine

## 2015-08-27 ENCOUNTER — Encounter: Payer: Self-pay | Admitting: Internal Medicine

## 2015-08-27 VITALS — BP 130/90 | HR 56 | Temp 98.5°F | Resp 20 | Ht 64.0 in | Wt 198.0 lb

## 2015-08-27 DIAGNOSIS — I10 Essential (primary) hypertension: Secondary | ICD-10-CM | POA: Diagnosis not present

## 2015-08-27 DIAGNOSIS — F411 Generalized anxiety disorder: Secondary | ICD-10-CM

## 2015-08-27 DIAGNOSIS — R410 Disorientation, unspecified: Secondary | ICD-10-CM

## 2015-08-27 DIAGNOSIS — E785 Hyperlipidemia, unspecified: Secondary | ICD-10-CM

## 2015-08-27 DIAGNOSIS — Z860100 Personal history of colon polyps, unspecified: Secondary | ICD-10-CM

## 2015-08-27 DIAGNOSIS — F05 Delirium due to known physiological condition: Secondary | ICD-10-CM

## 2015-08-27 DIAGNOSIS — R2681 Unsteadiness on feet: Secondary | ICD-10-CM

## 2015-08-27 DIAGNOSIS — E876 Hypokalemia: Secondary | ICD-10-CM | POA: Diagnosis not present

## 2015-08-27 LAB — CBC WITH DIFFERENTIAL/PLATELET
Basophils Absolute: 0 10*3/uL (ref 0.0–0.1)
Basophils Relative: 0.4 % (ref 0.0–3.0)
Eosinophils Absolute: 0.2 10*3/uL (ref 0.0–0.7)
Eosinophils Relative: 3 % (ref 0.0–5.0)
HCT: 38.4 % (ref 36.0–46.0)
Hemoglobin: 12.3 g/dL (ref 12.0–15.0)
Lymphocytes Relative: 29 % (ref 12.0–46.0)
Lymphs Abs: 2.1 10*3/uL (ref 0.7–4.0)
MCHC: 32 g/dL (ref 30.0–36.0)
MCV: 83.5 fl (ref 78.0–100.0)
Monocytes Absolute: 0.4 10*3/uL (ref 0.1–1.0)
Monocytes Relative: 5.2 % (ref 3.0–12.0)
Neutro Abs: 4.5 10*3/uL (ref 1.4–7.7)
Neutrophils Relative %: 62.4 % (ref 43.0–77.0)
Platelets: 210 10*3/uL (ref 150.0–400.0)
RBC: 4.59 Mil/uL (ref 3.87–5.11)
RDW: 13.6 % (ref 11.5–15.5)
WBC: 7.3 10*3/uL (ref 4.0–10.5)

## 2015-08-27 LAB — LIPID PANEL
Cholesterol: 233 mg/dL — ABNORMAL HIGH (ref 0–200)
HDL: 38.9 mg/dL — ABNORMAL LOW (ref >39.00)
NonHDL: 194.51
Total CHOL/HDL Ratio: 6
Triglycerides: 302 mg/dL — ABNORMAL HIGH (ref 0.0–149.0)
VLDL: 60.4 mg/dL — ABNORMAL HIGH (ref 0.0–40.0)

## 2015-08-27 LAB — COMPREHENSIVE METABOLIC PANEL
ALT: 14 U/L (ref 0–35)
AST: 13 U/L (ref 0–37)
Albumin: 3.8 g/dL (ref 3.5–5.2)
Alkaline Phosphatase: 102 U/L (ref 39–117)
BUN: 11 mg/dL (ref 6–23)
CO2: 33 mEq/L — ABNORMAL HIGH (ref 19–32)
Calcium: 9.2 mg/dL (ref 8.4–10.5)
Chloride: 103 mEq/L (ref 96–112)
Creatinine, Ser: 0.92 mg/dL (ref 0.40–1.20)
GFR: 64.51 mL/min (ref >60.00)
Glucose, Bld: 89 mg/dL (ref 70–99)
Potassium: 3.7 mEq/L (ref 3.5–5.1)
Sodium: 142 mEq/L (ref 135–145)
Total Bilirubin: 0.4 mg/dL (ref 0.2–1.2)
Total Protein: 6.4 g/dL (ref 6.0–8.3)

## 2015-08-27 LAB — SEDIMENTATION RATE: Sed Rate: 6 mm/hr (ref 0–22)

## 2015-08-27 LAB — TSH: TSH: 5.72 u[IU]/mL — ABNORMAL HIGH (ref 0.35–4.50)

## 2015-08-27 LAB — LDL CHOLESTEROL, DIRECT: Direct LDL: 150 mg/dL

## 2015-08-27 NOTE — Progress Notes (Signed)
Subjective:    Patient ID: Erica Morris, female    DOB: 1946-02-18, 69 y.o.   MRN: [id number]  HPI 69 year old patient who is seen today for her six-month follow-up.  Complaints include increasing fatigue and daytime hypersomnolence.  She states that she has had some increasing confusion and frequent falls.  She states that at times her left leg seems to give way.  She has a history of anxiety in medical regimen does include fluoxetine and Klonopin.  She has a history of hypokalemia but blood pressures, not controlled with amlodipine.  BP Readings from Last 3 Encounters:  08/27/15 130/90  02/20/15 120/80  06/04/14 110/72    Wt Readings from Last 3 Encounters:  08/27/15 198 lb (89.812 kg)  02/20/15 195 lb (88.451 kg)  06/04/14 190 lb (86.183 kg)   Past Medical History  Diagnosis Date  . ABDOMINAL PAIN, RECURRENT 03/29/2008  . ANXIETY 10/07/2007  . Candidiasis of mouth 05/30/2010  . GERD 07/25/2007  . HYPERTENSION 07/25/2007  . Hernia, hiatal   . Stroke     Social History   Social History  . Marital Status: Widowed    Spouse Name: N/A  . Number of Children: N/A  . Years of Education: N/A   Occupational History  . Not on file.   Social History Main Topics  . Smoking status: Never Smoker   . Smokeless tobacco: Never Used  . Alcohol Use: Yes     Comment: occasional  . Drug Use: No  . Sexual Activity: No   Other Topics Concern  . Not on file   Social History Narrative    Past Surgical History  Procedure Laterality Date  . Abdominal hysterectomy    . Knee arthroscopy    . Breast surgery      bx    Family History  Problem Relation Age of Onset  . Kidney disease Neg Hx   . Stroke Neg Hx   . Diabetes Neg Hx     Allergies  Allergen Reactions  . Lisinopril Cough  . Codeine Phosphate Itching  . Morphine And Related Itching    Burning sensation and itchiness with IV morphine    Current Outpatient Prescriptions on File Prior to Visit  Medication Sig  Dispense Refill  . amLODipine (NORVASC) 2.5 MG tablet TAKE 1 TABLET(2.5 MG) BY MOUTH DAILY 30 tablet 5  . aspirin EC 81 MG tablet Take 81 mg by mouth every morning.     . clonazePAM (KLONOPIN) 1 MG tablet TAKE 1 TABLET BY MOUTH TWICE DAILY AS NEEDED. 60 tablet 2  . FLUoxetine (PROZAC) 40 MG capsule Take 1 capsule (40 mg total) by mouth daily. 90 capsule 4  . fluticasone (FLOVENT HFA) 220 MCG/ACT inhaler 2 puffs swallowed twice daily rinse mouth after use do not swallow water and do not eat or drink 30 minutes after dose. 1 Inhaler 0  . pantoprazole (PROTONIX) 40 MG tablet TAKE 1 TABLET(40 MG) BY MOUTH DAILY 30 tablet 0   No current facility-administered medications on file prior to visit.    BP 130/90 mmHg  Pulse 56  Temp(Src) 98.5 F (36.9 C) (Oral)  Resp 20  Ht 5\' 4"  (1.626 m)  Wt 198 lb (89.812 kg)  BMI 33.97 kg/m2  SpO2 99%     Review of Systems  Constitutional: Positive for fatigue.  HENT: Negative for congestion, dental problem, hearing loss, rhinorrhea, sinus pressure, sore throat and tinnitus.   Eyes: Negative for pain, discharge and visual disturbance.  Respiratory:  Negative for cough and shortness of breath.   Cardiovascular: Negative for chest pain, palpitations and leg swelling.  Gastrointestinal: Negative for nausea, vomiting, abdominal pain, diarrhea, constipation, blood in stool and abdominal distention.  Genitourinary: Negative for dysuria, urgency, frequency, hematuria, flank pain, vaginal bleeding, vaginal discharge, difficulty urinating, vaginal pain and pelvic pain.  Musculoskeletal: Positive for gait problem. Negative for joint swelling and arthralgias.  Skin: Negative for rash.  Neurological: Negative for dizziness, syncope, speech difficulty, weakness, numbness and headaches.  Hematological: Negative for adenopathy.  Psychiatric/Behavioral: Positive for confusion. Negative for behavioral problems, dysphoric mood and agitation. The patient is nervous/anxious.         Objective:   Physical Exam  Constitutional: She is oriented to person, place, and time. She appears well-developed and well-nourished.  HENT:  Head: Normocephalic.  Right Ear: External ear normal.  Left Ear: External ear normal.  Mouth/Throat: Oropharynx is clear and moist.  Eyes: Conjunctivae and EOM are normal. Pupils are equal, round, and reactive to light.  Neck: Normal range of motion. Neck supple. No thyromegaly present.  Cardiovascular: Normal rate, regular rhythm, normal heart sounds and intact distal pulses.   Pulmonary/Chest: Effort normal and breath sounds normal.  Abdominal: Soft. Bowel sounds are normal. She exhibits no mass. There is no tenderness.  Musculoskeletal: Normal range of motion.  Lymphadenopathy:    She has no cervical adenopathy.  Neurological: She is alert and oriented to person, place, and time.  Reflexes brisk and equal No drift Finger to nose testing normal Gait.  Walks with a slight limp Heel to shin testing.  Probably normal Babinski.  Normal  Skin: Skin is warm and dry. No rash noted.  Psychiatric: She has a normal mood and affect. Her behavior is normal.          Assessment & Plan:  Hypertension, well-controlled History of confusion, unsteady gait.  Will check a head CT without contrast Anxiety disorder History of colonic polyps.  Status post follow-up colonoscopy 2015  Recheck 4 weeks Check updated lab

## 2015-08-27 NOTE — Patient Instructions (Signed)
Limit your sodium (Salt) intake  Return in 2 weeks for follow-up  Please check your blood pressure on a regular basis.  If it is consistently greater than 150/90, please make an office appointment.    It is important that you exercise regularly, at least 20 minutes 3 to 4 times per week.  If you develop chest pain or shortness of breath seek  medical attention.

## 2015-08-27 NOTE — Progress Notes (Signed)
Pre visit review using our clinic review tool, if applicable. No additional management support is needed unless otherwise documented below in the visit note. 

## 2015-08-29 ENCOUNTER — Ambulatory Visit (INDEPENDENT_AMBULATORY_CARE_PROVIDER_SITE_OTHER)
Admission: RE | Admit: 2015-08-29 | Discharge: 2015-08-29 | Disposition: A | Discharge status: Home / Self Care | Payer: Commercial Managed Care - HMO | Source: Ambulatory Visit | Attending: Internal Medicine | Admitting: Internal Medicine

## 2015-08-29 ENCOUNTER — Other Ambulatory Visit: Payer: Self-pay | Admitting: Gastroenterology

## 2015-08-29 DIAGNOSIS — R2681 Unsteadiness on feet: Secondary | ICD-10-CM

## 2015-08-29 DIAGNOSIS — F05 Delirium due to known physiological condition: Secondary | ICD-10-CM | POA: Diagnosis not present

## 2015-08-29 DIAGNOSIS — R410 Disorientation, unspecified: Secondary | ICD-10-CM

## 2015-09-09 ENCOUNTER — Ambulatory Visit: Payer: Commercial Managed Care - HMO | Admitting: Internal Medicine

## 2015-09-10 ENCOUNTER — Encounter: Payer: Self-pay | Admitting: Internal Medicine

## 2015-09-10 ENCOUNTER — Ambulatory Visit (INDEPENDENT_AMBULATORY_CARE_PROVIDER_SITE_OTHER): Payer: Commercial Managed Care - HMO | Admitting: Internal Medicine

## 2015-09-10 VITALS — BP 144/90 | HR 63 | Temp 97.9°F | Resp 20 | Ht 64.0 in | Wt 199.0 lb

## 2015-09-10 DIAGNOSIS — K219 Gastro-esophageal reflux disease without esophagitis: Secondary | ICD-10-CM

## 2015-09-10 DIAGNOSIS — I10 Essential (primary) hypertension: Secondary | ICD-10-CM

## 2015-09-10 DIAGNOSIS — R300 Dysuria: Secondary | ICD-10-CM | POA: Diagnosis not present

## 2015-09-10 DIAGNOSIS — Z86010 Personal history of colonic polyps: Secondary | ICD-10-CM

## 2015-09-10 LAB — POCT URINALYSIS DIPSTICK
Blood, UA: NEGATIVE
Glucose, UA: NEGATIVE
Ketones, UA: NEGATIVE
Nitrite, UA: NEGATIVE
Spec Grav, UA: >=1.03
Urobilinogen, UA: 0.2
pH, UA: 5

## 2015-09-10 MED ORDER — LEVOTHYROXINE SODIUM 25 MCG PO TABS: 25.0000 ug | ORAL_TABLET | Freq: Every day | ORAL | Status: DC

## 2015-09-10 NOTE — Patient Instructions (Signed)
Limit your sodium (Salt) intake    It is important that you exercise regularly, at least 20 minutes 3 to 4 times per week.  If you develop chest pain or shortness of breath seek  medical attention.  You need to lose weight.  Consider a lower calorie diet and regular exercise. 

## 2015-09-10 NOTE — Progress Notes (Signed)
Pre visit review using our clinic review tool, if applicable. No additional management support is needed unless otherwise documented below in the visit note. 

## 2015-09-10 NOTE — Progress Notes (Signed)
Subjective:    Patient ID: Erica Morris, female    DOB: [birth year], 69 y.o.   MRN: [id number]  HPI  69 year old patient who has a history of essential hypertension.  She also has a history of anxiety.  She was seen a couple weeks ago with multiple somatic complaints including daytime sleepiness, confusion, gait instability.  Head CT was performed and was normal.  Screening lab unremarkable except for a slightly elevated TSH.  She does have some chronic constipation issues  Today she complains some burning dysuria and occasional itching.  She also has some nocturia  Past Medical History  Diagnosis Date  . ABDOMINAL PAIN, RECURRENT 03/29/2008  . ANXIETY 10/07/2007  . Candidiasis of mouth 05/30/2010  . GERD 07/25/2007  . HYPERTENSION 07/25/2007  . Hernia, hiatal   . Stroke [facility])     Social History   Social History  . Marital Status: Widowed    Spouse Name: N/A  . Number of Children: N/A  . Years of Education: N/A   Occupational History  . Not on file.   Social History Main Topics  . Smoking status: Never Smoker   . Smokeless tobacco: Never Used  . Alcohol Use: Yes     Comment: occasional  . Drug Use: No  . Sexual Activity: No   Other Topics Concern  . Not on file   Social History Narrative    Past Surgical History  Procedure Laterality Date  . Abdominal hysterectomy    . Knee arthroscopy    . Breast surgery      bx    Family History  Problem Relation Age of Onset  . Kidney disease Neg Hx   . Stroke Neg Hx   . Diabetes Neg Hx     Allergies  Allergen Reactions  . Lisinopril Cough  . Codeine Phosphate Itching  . Morphine And Related Itching    Burning sensation and itchiness with IV morphine    Current Outpatient Prescriptions on File Prior to Visit  Medication Sig Dispense Refill  . amLODipine (NORVASC) 2.5 MG tablet TAKE 1 TABLET(2.5 MG) BY MOUTH DAILY 30 tablet 5  . aspirin EC 81 MG tablet Take 81 mg by mouth every morning.     . clonazePAM  (KLONOPIN) 1 MG tablet TAKE 1 TABLET BY MOUTH TWICE DAILY AS NEEDED. 60 tablet 2  . FLUoxetine (PROZAC) 40 MG capsule Take 1 capsule (40 mg total) by mouth daily. 90 capsule 4  . lisinopril (PRINIVIL,ZESTRIL) 20 MG tablet TK 1 T PO DAILY  3  . pantoprazole (PROTONIX) 40 MG tablet TAKE 1 TABLET(40 MG) BY MOUTH DAILY 30 tablet 0  . fluticasone (FLOVENT HFA) 220 MCG/ACT inhaler 2 puffs swallowed twice daily rinse mouth after use do not swallow water and do not eat or drink 30 minutes after dose. (Patient not taking: Reported on 09/10/2015) 1 Inhaler 0   No current facility-administered medications on file prior to visit.    BP 144/90 mmHg  Pulse 63  Temp(Src) 97.9 F (36.6 C) (Oral)  Resp 20  Ht 5\' 4"  (1.626 m)  Wt 199 lb (90.266 kg)  BMI 34.14 kg/m2  SpO2 99%          Review of Systems  Constitutional: Negative.   HENT: Negative for congestion, dental problem, hearing loss, rhinorrhea, sinus pressure, sore throat and tinnitus.   Eyes: Negative for pain, discharge and visual disturbance.  Respiratory: Negative for cough and shortness of breath.   Cardiovascular: Negative for chest pain,  palpitations and leg swelling.  Gastrointestinal: Negative for nausea, vomiting, abdominal pain, diarrhea, constipation, blood in stool and abdominal distention.  Genitourinary: Positive for dysuria and frequency. Negative for urgency, hematuria, flank pain, vaginal bleeding, vaginal discharge, difficulty urinating, vaginal pain and pelvic pain.  Musculoskeletal: Negative for joint swelling, arthralgias and gait problem.  Skin: Negative for rash.  Neurological: Negative for dizziness, syncope, speech difficulty, weakness, numbness and headaches.  Hematological: Negative for adenopathy.  Psychiatric/Behavioral: Negative for behavioral problems, dysphoric mood and agitation. The patient is nervous/anxious.        Objective:   Physical Exam  Constitutional: She appears well-developed and  well-nourished. No distress.  Blood pressure 140/82          Assessment & Plan:

## 2015-09-25 ENCOUNTER — Other Ambulatory Visit: Payer: Self-pay | Admitting: Gastroenterology

## 2015-09-27 ENCOUNTER — Telehealth: Payer: Self-pay | Admitting: Internal Medicine

## 2015-09-27 MED ORDER — PANTOPRAZOLE SODIUM 40 MG PO TBEC: DELAYED_RELEASE_TABLET | ORAL | Status: DC

## 2015-09-27 NOTE — Telephone Encounter (Signed)
Pt request refill pantoprazole (PROTONIX) 40 MG tablet. Pt states dr Deatra Ina was refilling, but he is no longer there .  They advised pt to call her PCP To get this RX.  Pt is out and states she needs for her stomach.  [facility]/ [address]

## 2015-09-27 NOTE — Telephone Encounter (Signed)
Pt notified Rx filled for Pantoprazole and sent to pharmacy. Pt verbalized understanding.

## 2015-09-29 ENCOUNTER — Other Ambulatory Visit: Payer: Self-pay | Admitting: Internal Medicine

## 2015-10-02 ENCOUNTER — Telehealth: Payer: Self-pay | Admitting: Internal Medicine

## 2015-10-02 ENCOUNTER — Ambulatory Visit (INDEPENDENT_AMBULATORY_CARE_PROVIDER_SITE_OTHER): Payer: Commercial Managed Care - HMO | Admitting: Adult Health

## 2015-10-02 ENCOUNTER — Encounter: Payer: Self-pay | Admitting: Adult Health

## 2015-10-02 VITALS — BP 120/82 | HR 83 | Temp 98.5°F | Ht 64.0 in | Wt 198.3 lb

## 2015-10-02 DIAGNOSIS — R350 Frequency of micturition: Secondary | ICD-10-CM

## 2015-10-02 DIAGNOSIS — I10 Essential (primary) hypertension: Secondary | ICD-10-CM

## 2015-10-02 LAB — POCT URINALYSIS DIPSTICK
Bilirubin, UA: NEGATIVE
Blood, UA: NEGATIVE
Glucose, UA: NEGATIVE
Ketones, UA: NEGATIVE
Nitrite, UA: NEGATIVE
Spec Grav, UA: >=1.03
Urobilinogen, UA: 0.2
pH, UA: 5.5

## 2015-10-02 MED ORDER — CIPROFLOXACIN HCL 500 MG PO TABS: 500.0000 mg | ORAL_TABLET | Freq: Two times a day (BID) | ORAL | Status: DC

## 2015-10-02 NOTE — Progress Notes (Signed)
Pre visit review using our clinic review tool, if applicable. No additional management support is needed unless otherwise documented below in the visit note. 

## 2015-10-02 NOTE — Progress Notes (Signed)
Subjective:    Patient ID: Erica Morris, female    DOB: [birth year], 69 y.o.   MRN: [id number]  HPI 69 year old female, patient of Dr. Marthann Schiller, who presents to the office today for uncontrolled blood pressure.Her blood pressures have been elevated into the 160's/100's. This all started about 2-3 days ago. Has been arguing with her daughter a lot and endorses that her blood pressure goes up when this happens. When she calms down her blood pressure goes down.   Today in the office her blood pressure is 120/82.   She also complains of UTI type symptoms for 1 + week. She endorses having frequency, hesitancy, and cloudy urine. She denies dysuria or hematuria. Drinks a lot of water.         Review of Systems  Respiratory: Negative.   Cardiovascular: Negative.   Gastrointestinal: Positive for abdominal pain.  Neurological: Positive for headaches.  All other systems reviewed and are negative.  Past Medical History  Diagnosis Date  . ABDOMINAL PAIN, RECURRENT 03/29/2008  . ANXIETY 10/07/2007  . Candidiasis of mouth 05/30/2010  . GERD 07/25/2007  . HYPERTENSION 07/25/2007  . Hernia, hiatal   . Stroke [facility])     Social History   Social History  . Marital Status: Widowed    Spouse Name: N/A  . Number of Children: N/A  . Years of Education: N/A   Occupational History  . Not on file.   Social History Main Topics  . Smoking status: Never Smoker   . Smokeless tobacco: Never Used  . Alcohol Use: Yes     Comment: occasional  . Drug Use: No  . Sexual Activity: No   Other Topics Concern  . Not on file   Social History Narrative    Past Surgical History  Procedure Laterality Date  . Abdominal hysterectomy    . Knee arthroscopy    . Breast surgery      bx    Family History  Problem Relation Age of Onset  . Kidney disease Neg Hx   . Stroke Neg Hx   . Diabetes Neg Hx     Allergies  Allergen Reactions  . Lisinopril Cough  . Codeine Phosphate Itching  . Morphine And  Related Itching    Burning sensation and itchiness with IV morphine    Current Outpatient Prescriptions on File Prior to Visit  Medication Sig Dispense Refill  . amLODipine (NORVASC) 2.5 MG tablet TAKE 1 TABLET(2.5 MG) BY MOUTH DAILY 30 tablet 5  . aspirin EC 81 MG tablet Take 81 mg by mouth every morning.     . clonazePAM (KLONOPIN) 1 MG tablet TAKE 1 TABLET BY MOUTH TWICE DAILY AS NEEDED 60 tablet 2  . FLUoxetine (PROZAC) 40 MG capsule Take 1 capsule (40 mg total) by mouth daily. 90 capsule 4  . fluticasone (FLOVENT HFA) 220 MCG/ACT inhaler 2 puffs swallowed twice daily rinse mouth after use do not swallow water and do not eat or drink 30 minutes after dose. 1 Inhaler 0  . levothyroxine (SYNTHROID, LEVOTHROID) 25 MCG tablet Take 1 tablet (25 mcg total) by mouth daily. 90 tablet 3  . lisinopril (PRINIVIL,ZESTRIL) 20 MG tablet TK 1 T PO DAILY  3  . pantoprazole (PROTONIX) 40 MG tablet TAKE 1 TABLET(40 MG) BY MOUTH DAILY 30 tablet 5   No current facility-administered medications on file prior to visit.    BP 120/82 mmHg  Pulse 83  Temp(Src) 98.5 F (36.9 C) (Oral)  Ht 5'  4" (1.626 m)  Wt 198 lb 4.8 oz (89.948 kg)  BMI 34.02 kg/m2  SpO2 97%       Objective:   Physical Exam  Constitutional: She is oriented to person, place, and time. She appears well-developed and well-nourished. She appears distressed.  Cardiovascular: Normal rate, regular rhythm, normal heart sounds and intact distal pulses.  Exam reveals no gallop.   No murmur heard. Pulmonary/Chest: Effort normal and breath sounds normal.  Abdominal: Soft. Bowel sounds are normal. She exhibits no distension and no mass. There is tenderness (pain with deep palpation to umbilicus. ). There is no rebound and no guarding.  Musculoskeletal: Normal range of motion. She exhibits no edema or tenderness.  No CVA tenderness  Lymphadenopathy:    She has no cervical adenopathy.  Neurological: She is alert and oriented to person, place,  and time.  Skin: Skin is warm and dry. No rash noted. She is not diaphoretic. No erythema. No pallor.  Psychiatric: She has a normal mood and affect. Her behavior is normal. Judgment and thought content normal.  Nursing note and vitals reviewed.      Assessment & Plan:  1. Essential hypertension - Her elevation in blood pressure is likely from the stress at home. I would be concerned that increasing her BP medication would cause hypotension when she is calm. I advised her to refrain from arguing with her daughter. When she finds herself getting worked up she should do deep breathing exercises or remove herself form the situation  2. Urinary frequency - POC Urinalysis Dipstick - Culture, Urine - ciprofloxacin (CIPRO) 500 MG tablet; Take 1 tablet (500 mg total) by mouth 2 (two) times daily.  Dispense: 6 tablet; Refill: 0

## 2015-10-02 NOTE — Patient Instructions (Signed)
It was a pleasure meeting you today.   I have sent in a prescription for Cipro, take this two times a day for 3 days.   Try and stay calm so your blood pressure stays low.

## 2015-10-02 NOTE — Telephone Encounter (Signed)
Appt was scheduled to see nurse practitioner at 2:30pm today

## 2015-10-02 NOTE — Telephone Encounter (Signed)
[facility] - Client [place] Patient Name: Erica Morris DOB: [birth year] Initial Comment Caller states her sister-in-law is having high BP, sitting BP is 169/86 and standing BP is 169/101, also lightheaded and a headache. Nurse Assessment Nurse: Mechele Dawley, RN, Amy Date/Time Eilene Ghazi Time): 10/02/2015 12:51:26 PM Confirm and document reason for call. If symptomatic, describe symptoms. ---CALLER STATES THAT SHE WAS DIZZY, LIGHT HEADED. HE IS HAVING HEADACHE. SHE IS GETTING PAIN IN HER EARS. THYROID PROBLEMS ARE STILL CONTINUING. ELEVATED BP X 2 DAYS. Has the patient traveled out of the country within the last 30 days? ---Not Applicable Does the patient have any new or worsening symptoms? ---Yes Will a triage be completed? ---Yes Related visit to physician within the last 2 weeks? ---Yes Does the PT have any chronic conditions? (i.e. diabetes, asthma, etc.) ---Yes List chronic conditions. ---THYROID, HYPERTENSION Guidelines Guideline Title Affirmed Question Affirmed Notes Headache [1] SEVERE headache (e.g., excruciating) AND [2] not improved after 2 hours of pain medicine Final Disposition User See Physician within 4 Hours (or PCP triage) [place], RN, Amy Comments PATIENT HAS MULTIPLE SYMPTOMS OF HEADACHE, INCR BP - READING AT TRIAGE WAS 144/79. SHE IS HAVING FREQ FALLS, DIZZY AND LIGHT HEADED. SHE NEEDS TO BE SEEN IN THE MD OFFICE TODAY. NO APPT'S AVAILABLE WITH PCP WILL BE SCHEDULED WITH NP. Referrals REFERRED TO PCP OFFICE REFERRED TO PCP OFFICE Disagree/Comply: Comply

## 2015-10-04 ENCOUNTER — Telehealth: Payer: Self-pay | Admitting: *Deleted

## 2015-10-04 LAB — URINE CULTURE
Colony Count: NO GROWTH
Organism ID, Bacteria: NO GROWTH

## 2015-10-04 NOTE — Telephone Encounter (Signed)
[facility] - Client TELEPHONE [facility] Patient Name: Erica Morris Gender: Female DOB: 1946-11-28 Age: 69 Y 69 M 28 D Return Phone Number: [phone] (Primary) Address: City/State/Zip: [place] Client [facility] - Client Client Site [facility] - Night Physician Simonne Martinet Contact Type Call Call Type Triage / Clinical Caller Name Janora Norlander Relationship To Patient Sibling Return Phone Number [phone] (Primary) Chief Complaint Blood Pressure High Initial Comment Caller states the pt's blood pressure keeps rising. Blood pressure is 168/101. Takes one bp pill in the am. PreDisposition Go to ED Nurse Assessment Nurse: Marisa Severin, RN, Beth Date/Time (Eastern Time): 10/03/2015 6:48:07 PM Confirm and document reason for call. If symptomatic, describe symptoms. ---Caller states her sister takes a b/p bill in the morning. 168/101 via wrist cuff about 30 min ago. Seen by PA and no changes made at this time. Caller's sibling has headache. 155/89 current. Has the patient traveled out of the country within the last 30 days? ---No Does the patient have any new or worsening symptoms? ---Yes Will a triage be completed? ---Yes Related visit to physician within the last 2 weeks? ---Yes Does the PT have any chronic conditions? (i.e. diabetes, asthma, etc.) ---Yes List chronic conditions. ---htn thyroid anxiety Guidelines Guideline Title Affirmed Question Affirmed Notes Nurse Date/Time (Eastern Time) High Blood Pressure BP # 160/100 Solocinski, RN, Beth 10/03/2015 6:55:57 PM Disp. Time Eilene Ghazi Time) Disposition Final User 10/03/2015 7:02:04 PM See PCP When Office is Open (within 3 days) Yes Solocinski, RN, Personal assistant Understands: Yes Disagree/Comply: Comply PLEASE NOTE: All timestamps contained within this report are represented as [place] Standard Time. CONFIDENTIALTY  NOTICE: This fax transmission is intended only for the addressee. It contains information that is legally privileged, confidential or otherwise protected from use or disclosure. If you are not the intended recipient, you are strictly prohibited from reviewing, disclosing, copying using or disseminating any of this information or taking any action in reliance on or regarding this information. If you have received this fax in error, please notify us immediately by telephone so that we can arrange for its return to [place]. Phone: [phone], Toll-Free: [phone], Fax: [phone] Page: 2 of 2 Call Id: [id number] Care Advice Given Per Guideline SEE PCP WITHIN 3 DAYS: HIGH BLOOD PRESSURE: * Untreated high blood pressure may cause damage to your heart, brain, kidneys, and eyes. * Treatment of high blood pressure can reduce the risk of stroke, heart attack, and heart failure. * The goal of blood pressure treatment for most people with hypertension is to keep the blood pressure under 140/90. For people that are 60 years or older, your doctor may instead want to keep the blood pressure under 150/90. CALL BACK IF: * Weakness or numbness of the face, arm or leg on one side of the body occurs * Difficulty walking, difficulty talking, or severe headache occurs * Chest pain or difficulty breathing occurs * Your blood pressure is over 180/110 * You become worse. * REDUCE STRESS: Find activities that help reduce your stress. Examples might include meditation, yoga, or even a restful walk in a park. After Care Instructions Given Call Event Type User Date / Time Description

## 2015-10-04 NOTE — Telephone Encounter (Signed)
Spoke to pt, told her discussed with Dr.K and he said to monitor your blood pressure and take Tylenol for your headache, decrease your stress feels your blood pressure is up due to situation. Pt verbalized understanding and stated it was elevated last night and not sure if cuff is correct cause it is old. Told her need to get a new cuff so she can monitor blood pressure and call if consistently greater than 140/90. Pt verbalized understanding.

## 2015-10-18 ENCOUNTER — Ambulatory Visit (INDEPENDENT_AMBULATORY_CARE_PROVIDER_SITE_OTHER): Payer: Commercial Managed Care - HMO | Admitting: Family Medicine

## 2015-10-18 DIAGNOSIS — J069 Acute upper respiratory infection, unspecified: Secondary | ICD-10-CM | POA: Diagnosis not present

## 2015-10-18 DIAGNOSIS — B9789 Other viral agents as the cause of diseases classified elsewhere: Principal | ICD-10-CM

## 2015-10-18 MED ORDER — HYDROCODONE-HOMATROPINE 5-1.5 MG/5ML PO SYRP: 5.0000 mL | ORAL_SOLUTION | Freq: Four times a day (QID) | ORAL | Status: AC | PRN

## 2015-10-18 NOTE — Progress Notes (Signed)
   Subjective:    Patient ID: Erica Morris, female    DOB: [birth year], 69 y.o.   MRN: [id number]  HPI Acute visit. Nonsmoker seen with 3 history of sore throat, pansinusitis symptoms, and cough. No fever. Cough was severe last night and interfered with sleep. She has not gotten relief with over-the-counter cough medicines. Taken Tessalon in the past without relief. She has reported allergy to morphine and codeine but has taken hydrocodone cough syrup multiple times in the past without difficulty. No history of asthma.  Past Medical History  Diagnosis Date  . ABDOMINAL PAIN, RECURRENT 03/29/2008  . ANXIETY 10/07/2007  . Candidiasis of mouth 05/30/2010  . GERD 07/25/2007  . HYPERTENSION 07/25/2007  . Hernia, hiatal   . Stroke [facility])    Past Surgical History  Procedure Laterality Date  . Abdominal hysterectomy    . Knee arthroscopy    . Breast surgery      bx    reports that she has never smoked. She has never used smokeless tobacco. She reports that she drinks alcohol. She reports that she does not use illicit drugs. family history is negative for Kidney disease, Stroke, and Diabetes. Allergies  Allergen Reactions  . Lisinopril Cough  . Codeine Phosphate Itching  . Morphine And Related Itching    Burning sensation and itchiness with IV morphine      Review of Systems  Constitutional: Negative for fever, chills, appetite change and unexpected weight change.  HENT: Positive for congestion, sinus pressure and sore throat.   Respiratory: Positive for cough.   Neurological: Negative for headaches.       Objective:   Physical Exam  Constitutional: She appears well-developed and well-nourished.  HENT:  Right Ear: External ear normal.  Left Ear: External ear normal.  Mouth/Throat: Oropharynx is clear and moist.  Neck: Neck supple.  Cardiovascular: Normal rate and regular rhythm.   Pulmonary/Chest: Breath sounds normal. No respiratory distress. She has no wheezes. She has no  rales.  Lymphadenopathy:    She has no cervical adenopathy.          Assessment & Plan:  Probable viral URI with cough. Treat symptomatically. Refill Hycodan for as needed use. Follow-up promptly for any fever or change of symptoms

## 2015-10-18 NOTE — Progress Notes (Signed)
Pre visit review using our clinic review tool, if applicable. No additional management support is needed unless otherwise documented below in the visit note. 

## 2015-10-18 NOTE — Patient Instructions (Signed)

## 2015-10-21 ENCOUNTER — Telehealth: Payer: Self-pay | Admitting: Internal Medicine

## 2015-10-21 NOTE — Telephone Encounter (Signed)
Pt decided on appt

## 2015-10-23 ENCOUNTER — Ambulatory Visit (INDEPENDENT_AMBULATORY_CARE_PROVIDER_SITE_OTHER): Payer: Commercial Managed Care - HMO | Admitting: Adult Health

## 2015-10-23 ENCOUNTER — Encounter: Payer: Self-pay | Admitting: Adult Health

## 2015-10-23 VITALS — BP 120/80 | HR 63 | Temp 98.6°F | Ht 64.0 in | Wt 195.0 lb

## 2015-10-23 DIAGNOSIS — J209 Acute bronchitis, unspecified: Secondary | ICD-10-CM | POA: Diagnosis not present

## 2015-10-23 MED ORDER — METHYLPREDNISOLONE 4 MG PO TBPK: ORAL_TABLET | ORAL | Status: DC

## 2015-10-23 NOTE — Progress Notes (Signed)
Pre visit review using our clinic review tool, if applicable. No additional management support is needed unless otherwise documented below in the visit note. 

## 2015-10-23 NOTE — Patient Instructions (Addendum)
It was great seeing you again!  Please take the prednisone as directed. Continue with the cough syrup at night and use Mucinex during the day.   Acute Bronchitis Bronchitis is inflammation of the airways that extend from the windpipe into the lungs (bronchi). The inflammation often causes mucus to develop. This leads to a cough, which is the most common symptom of bronchitis.  In acute bronchitis, the condition usually develops suddenly and goes away over time, usually in a couple weeks. Smoking, allergies, and asthma can make bronchitis worse. Repeated episodes of bronchitis may cause further lung problems.  CAUSES Acute bronchitis is most often caused by the same virus that causes a cold. The virus can spread from person to person (contagious) through coughing, sneezing, and touching contaminated objects. SIGNS AND SYMPTOMS   Cough.   Fever.   Coughing up mucus.   Body aches.   Chest congestion.   Chills.   Shortness of breath.   Sore throat.  DIAGNOSIS  Acute bronchitis is usually diagnosed through a physical exam. Your health care provider will also ask you questions about your medical history. Tests, such as chest X-rays, are sometimes done to rule out other conditions.  TREATMENT  Acute bronchitis usually goes away in a couple weeks. Oftentimes, no medical treatment is necessary. Medicines are sometimes given for relief of fever or cough. Antibiotic medicines are usually not needed but may be prescribed in certain situations. In some cases, an inhaler may be recommended to help reduce shortness of breath and control the cough. A cool mist vaporizer may also be used to help thin bronchial secretions and make it easier to clear the chest.  HOME CARE INSTRUCTIONS  Get plenty of rest.   Drink enough fluids to keep your urine clear or pale yellow (unless you have a medical condition that requires fluid restriction). Increasing fluids may help thin your respiratory  secretions (sputum) and reduce chest congestion, and it will prevent dehydration.   Take medicines only as directed by your health care provider.  If you were prescribed an antibiotic medicine, finish it all even if you start to feel better.  Avoid smoking and secondhand smoke. Exposure to cigarette smoke or irritating chemicals will make bronchitis worse. If you are a smoker, consider using nicotine gum or skin patches to help control withdrawal symptoms. Quitting smoking will help your lungs heal faster.   Reduce the chances of another bout of acute bronchitis by washing your hands frequently, avoiding people with cold symptoms, and trying not to touch your hands to your mouth, nose, or eyes.   Keep all follow-up visits as directed by your health care provider.  SEEK MEDICAL CARE IF: Your symptoms do not improve after 1 week of treatment.  SEEK IMMEDIATE MEDICAL CARE IF:  You develop an increased fever or chills.   You have chest pain.   You have severe shortness of breath.  You have bloody sputum.   You develop dehydration.  You faint or repeatedly feel like you are going to pass out.  You develop repeated vomiting.  You develop a severe headache. MAKE SURE YOU:   Understand these instructions.  Will watch your condition.  Will get help right away if you are not doing well or get worse.   This information is not intended to replace advice given to you by your health care provider. Make sure you discuss any questions you have with your health care provider.   Document Released: 12/24/2004 Document Revised: 12/07/2014 Document  Reviewed: 05/09/2013 Elsevier Interactive Patient Education [employer].

## 2015-10-23 NOTE — Progress Notes (Signed)
   Subjective:    Patient ID: Erica Morris, female    DOB: 02-Nov-1946, 69 y.o.   MRN: [id number]  HPI  69 year old female who was seen in the office by Dr. Elease Hashimoto on 11/18 for cough and sore throat. She was diagnosed with a viral URI and was give Hydrocodone cough syrup.   She reports today that she continues to cough and feels wheezy. The cough syrup has been helping her sleep. She denies any fevers or sinus pain and pressure.   Review of Systems  Constitutional: Negative.   HENT: Positive for congestion. Negative for postnasal drip, rhinorrhea, sinus pressure and sore throat.   Eyes: Negative.   Respiratory: Positive for cough and wheezing. Negative for chest tightness and shortness of breath.   Cardiovascular: Negative.   Neurological: Negative.   All other systems reviewed and are negative.      Objective:   Physical Exam  Constitutional: She is oriented to person, place, and time. She appears well-developed and well-nourished. No distress.  Neck: Normal range of motion. Neck supple.  Cardiovascular: Normal rate, regular rhythm and normal heart sounds.  Exam reveals no gallop and no friction rub.   No murmur heard. Pulmonary/Chest: Effort normal. No respiratory distress. She has wheezes. She has no rales. She exhibits no tenderness.  Increased expiatory phsase Decreased at bases  Abdominal: Soft. Bowel sounds are normal. She exhibits no distension and no mass. There is no tenderness. There is no rebound and no guarding.  Lymphadenopathy:    She has no cervical adenopathy.  Neurological: She is alert and oriented to person, place, and time.  Skin: Skin is warm and dry. No rash noted. She is not diaphoretic. No erythema. No pallor.  Psychiatric: She has a normal mood and affect. Her behavior is normal. Thought content normal.  Nursing note and vitals reviewed.      Assessment & Plan:  1. Acute bronchitis, unspecified organism - methylPREDNISolone (MEDROL DOSEPAK) 4 MG  TBPK tablet; Take as directed  Dispense: 21 tablet; Refill: 0 - Continue to use cough syrup at night and mucinex during the day - Follow up if no improvement.

## 2015-12-11 ENCOUNTER — Ambulatory Visit: Payer: Commercial Managed Care - HMO | Admitting: Internal Medicine

## 2016-01-05 ENCOUNTER — Other Ambulatory Visit: Payer: Self-pay | Admitting: Gastroenterology

## 2016-01-06 ENCOUNTER — Telehealth: Payer: Self-pay | Admitting: Internal Medicine

## 2016-01-06 MED ORDER — PANTOPRAZOLE SODIUM 40 MG PO TBEC: DELAYED_RELEASE_TABLET | ORAL | Status: DC

## 2016-01-06 MED ORDER — AMLODIPINE BESYLATE 2.5 MG PO TABS: ORAL_TABLET | ORAL | Status: DC

## 2016-01-06 MED ORDER — FLUOXETINE HCL 40 MG PO CAPS: 40.0000 mg | ORAL_CAPSULE | Freq: Every day | ORAL | Status: DC

## 2016-01-06 NOTE — Telephone Encounter (Signed)
Pt request refill of the following:   FLUoxetine (PROZAC) 40 MG capsule ,  pantoprazole (PROTONIX) 40 MG tablet , amLODipine (NORVASC) 2.5 MG tablet   Phamacy:  [facility]  [place]

## 2016-01-06 NOTE — Telephone Encounter (Signed)
Pt notified Rx's sent to pharmacy as requested. Pt verbalized understanding. 

## 2016-03-09 ENCOUNTER — Encounter: Payer: Self-pay | Admitting: Internal Medicine

## 2016-03-09 ENCOUNTER — Ambulatory Visit (INDEPENDENT_AMBULATORY_CARE_PROVIDER_SITE_OTHER): Payer: Commercial Managed Care - HMO | Admitting: Internal Medicine

## 2016-03-09 VITALS — BP 150/90 | HR 65 | Temp 98.5°F | Resp 20 | Ht 64.0 in | Wt 194.0 lb

## 2016-03-09 DIAGNOSIS — R300 Dysuria: Secondary | ICD-10-CM | POA: Diagnosis not present

## 2016-03-09 DIAGNOSIS — W57XXXA Bitten or stung by nonvenomous insect and other nonvenomous arthropods, initial encounter: Secondary | ICD-10-CM | POA: Diagnosis not present

## 2016-03-09 DIAGNOSIS — I10 Essential (primary) hypertension: Secondary | ICD-10-CM

## 2016-03-09 DIAGNOSIS — T148 Other injury of unspecified body region: Secondary | ICD-10-CM

## 2016-03-09 LAB — POC URINALSYSI DIPSTICK (AUTOMATED)
Bilirubin, UA: NEGATIVE
Blood, UA: NEGATIVE
Glucose, UA: NEGATIVE
Ketones, UA: NEGATIVE
Nitrite, UA: NEGATIVE
Protein, UA: NEGATIVE
Spec Grav, UA: 1.025
Urobilinogen, UA: 0.2
pH, UA: 5.5

## 2016-03-09 MED ORDER — TRIAMCINOLONE ACETONIDE 0.1 % EX CREA: 1.0000 "application " | TOPICAL_CREAM | Freq: Two times a day (BID) | CUTANEOUS | Status: DC

## 2016-03-09 NOTE — Progress Notes (Signed)
Subjective:    Patient ID: Erica Morris, female    DOB: 09/06/1946, 70 y.o.   MRN: [id number]  HPI  70 year old patient who has essential hypertension. She presents today with 2 complaints.  One is recurrent bed bites. She states this started when she began staying at a [facility].  For the past week.  She is also had urinary frequency.  No dysuria, fever, chills or flank pain.   She has essential hypertension which has been stable.  Past Medical History  Diagnosis Date  . ABDOMINAL PAIN, RECURRENT 03/29/2008  . ANXIETY 10/07/2007  . Candidiasis of mouth 05/30/2010  . GERD 07/25/2007  . HYPERTENSION 07/25/2007  . Hernia, hiatal   . Stroke [facility])     Social History   Social History  . Marital Status: Widowed    Spouse Name: N/A  . Number of Children: N/A  . Years of Education: N/A   Occupational History  . Not on file.   Social History Main Topics  . Smoking status: Never Smoker   . Smokeless tobacco: Never Used  . Alcohol Use: Yes     Comment: occasional  . Drug Use: No  . Sexual Activity: No   Other Topics Concern  . Not on file   Social History Narrative    Past Surgical History  Procedure Laterality Date  . Abdominal hysterectomy    . Knee arthroscopy    . Breast surgery      bx    Family History  Problem Relation Age of Onset  . Kidney disease Neg Hx   . Stroke Neg Hx   . Diabetes Neg Hx     Allergies  Allergen Reactions  . Lisinopril Cough  . Codeine Phosphate Itching  . Morphine And Related Itching    Burning sensation and itchiness with IV morphine    Current Outpatient Prescriptions on File Prior to Visit  Medication Sig Dispense Refill  . amLODipine (NORVASC) 2.5 MG tablet TAKE 1 TABLET(2.5 MG) BY MOUTH DAILY 30 tablet 5  . aspirin EC 81 MG tablet Take 81 mg by mouth every morning.     . clonazePAM (KLONOPIN) 1 MG tablet TAKE 1 TABLET BY MOUTH TWICE DAILY AS NEEDED 60 tablet 2  . FLUoxetine (PROZAC) 40 MG capsule Take 1 capsule (40 mg  total) by mouth daily. 90 capsule 1  . levothyroxine (SYNTHROID, LEVOTHROID) 25 MCG tablet Take 1 tablet (25 mcg total) by mouth daily. 90 tablet 3  . lisinopril (PRINIVIL,ZESTRIL) 20 MG tablet TK 1 T PO DAILY  3  . methylPREDNISolone (MEDROL DOSEPAK) 4 MG TBPK tablet Take as directed 21 tablet 0  . pantoprazole (PROTONIX) 40 MG tablet TAKE 1 TABLET(40 MG) BY MOUTH DAILY 30 tablet 5   No current facility-administered medications on file prior to visit.    BP 150/90 mmHg  Pulse 65  Temp(Src) 98.5 F (36.9 C) (Oral)  Resp 20  Ht 5\' 4"  (1.626 m)  Wt 194 lb (87.998 kg)  BMI 33.28 kg/m2  SpO2 98%      Review of Systems  Constitutional: Negative.   HENT: Negative for congestion, dental problem, hearing loss, rhinorrhea, sinus pressure, sore throat and tinnitus.   Eyes: Negative for pain, discharge and visual disturbance.  Respiratory: Negative for cough and shortness of breath.   Cardiovascular: Negative for chest pain, palpitations and leg swelling.  Gastrointestinal: Negative for nausea, vomiting, abdominal pain, diarrhea, constipation, blood in stool and abdominal distention.  Genitourinary: Positive for frequency.  Negative for dysuria, urgency, hematuria, flank pain, vaginal bleeding, vaginal discharge, difficulty urinating, vaginal pain and pelvic pain.  Musculoskeletal: Negative for joint swelling, arthralgias and gait problem.  Skin: Positive for rash.  Neurological: Negative for dizziness, syncope, speech difficulty, weakness, numbness and headaches.  Hematological: Negative for adenopathy.  Psychiatric/Behavioral: Negative for behavioral problems, dysphoric mood and agitation. The patient is not nervous/anxious.        Objective:   Physical Exam  Constitutional: She is oriented to person, place, and time. She appears well-developed and well-nourished.  Repeat blood pressure 132/82  HENT:  Head: Normocephalic.  Right Ear: External ear normal.  Left Ear: External ear  normal.  Mouth/Throat: Oropharynx is clear and moist.  Eyes: Conjunctivae and EOM are normal. Pupils are equal, round, and reactive to light.  Neck: Normal range of motion. Neck supple. No thyromegaly present.  Cardiovascular: Normal rate, regular rhythm, normal heart sounds and intact distal pulses.   Pulmonary/Chest: Effort normal and breath sounds normal.  Abdominal: Soft. Bowel sounds are normal. She exhibits no mass. There is no tenderness.  Musculoskeletal: Normal range of motion.  Lymphadenopathy:    She has no cervical adenopathy.  Neurological: She is alert and oriented to person, place, and time.  Skin: Skin is warm and dry. No rash noted.  Scattered  erythematous lesions with some crusting consistent with bed bites  Psychiatric: She has a normal mood and affect. Her behavior is normal.          Assessment & Plan:   Bedbug bites.  Will treat with topical triamcinolone and Benadryl Hypertension, stable Urinary frequency.  Will review a urinalysis  Recheck 3 months

## 2016-03-09 NOTE — Progress Notes (Signed)
Pre visit review using our clinic review tool, if applicable. No additional management support is needed unless otherwise documented below in the visit note. 

## 2016-03-09 NOTE — Patient Instructions (Signed)
Bedbugs Bedbugs are tiny bugs that live in and around beds. During the day, they stay hidden. At night, they come out and bite. WHERE ARE BEDBUGS FOUND? Bedbugs can be found anywhere. It does not matter if a place is clean or dirty. They are often found in:  Hotels.  Shelters.  Dorms.  Hospitals.  Nursing homes.  Places where there are many birds or bats. WHAT ARE [place]? A bedbug bite leaves a small red bump with a darker red dot in the middle. The bump may show up soon after a person is bitten or a day or more later. Bedbug bites usually do not hurt, but they may itch. Most people do not need treatment for bedbug bites. The bumps usually go away on their own in a few days. HOW DO I CHECK FOR BEDBUGS? Bedbugs are reddish-brown, oval, and flat. They are very small and they cannot fly. Look for bedbugs in these places:  On mattresses, bed frames, headboards, and box springs.  On drapes and curtains in bedrooms.  Under the carpet in bedrooms.  Behind electrical outlets.  Behind any wallpaper that is peeling.  Inside luggage. Also look for black or red spots or stains on or near the bed. WHAT SHOULD I DO IF I FIND BEDBUGS? When Traveling Check your clothes, suitcase, and belongings for bedbugs before you go back home. You may want to throw away anything that has bedbugs on it. At Home Your bedroom may need to be treated by a pest control expert. You may also need to throw away mattresses or luggage. To help keep bedbugs from coming back, you may want to:  Put a plastic cover over your mattress.  Wash your clothes and bedding in water that is hotter than 120F (48.9C). Dry them on a hot setting.  Vacuum often around the bed and in all of the cracks where the bugs might hide.  Check all used furniture, bedding, or clothes that you bring into your home.  Get rid of bird nests and bat roosts that are near your home. In Your Bed Try wearing pajamas that have long  sleeves and pant legs. Bedbugs usually bite areas of the skin that are not covered.   This information is not intended to replace advice given to you by your health care provider. Make sure you discuss any questions you have with your health care provider.   Document Released: 03/03/2011 Document Revised: 04/02/2015 Document Reviewed: 11/12/2014 Elsevier Interactive Patient Education [employer].

## 2016-03-16 ENCOUNTER — Ambulatory Visit: Payer: Commercial Managed Care - HMO | Admitting: Internal Medicine

## 2016-03-23 ENCOUNTER — Emergency Department (HOSPITAL_COMMUNITY): Payer: Commercial Managed Care - HMO

## 2016-03-23 ENCOUNTER — Encounter (HOSPITAL_COMMUNITY): Payer: Self-pay | Admitting: Emergency Medicine

## 2016-03-23 ENCOUNTER — Emergency Department (HOSPITAL_COMMUNITY)
Admission: EM | Admit: 2016-03-23 | Discharge: 2016-03-23 | Disposition: A | Discharge status: Home / Self Care | Payer: Commercial Managed Care - HMO | Attending: Emergency Medicine | Admitting: Emergency Medicine

## 2016-03-23 DIAGNOSIS — Z79899 Other long term (current) drug therapy: Secondary | ICD-10-CM | POA: Diagnosis not present

## 2016-03-23 DIAGNOSIS — I10 Essential (primary) hypertension: Secondary | ICD-10-CM | POA: Diagnosis not present

## 2016-03-23 DIAGNOSIS — R42 Dizziness and giddiness: Secondary | ICD-10-CM | POA: Insufficient documentation

## 2016-03-23 DIAGNOSIS — K219 Gastro-esophageal reflux disease without esophagitis: Secondary | ICD-10-CM | POA: Insufficient documentation

## 2016-03-23 DIAGNOSIS — Z7952 Long term (current) use of systemic steroids: Secondary | ICD-10-CM | POA: Insufficient documentation

## 2016-03-23 DIAGNOSIS — F419 Anxiety disorder, unspecified: Secondary | ICD-10-CM | POA: Diagnosis not present

## 2016-03-23 DIAGNOSIS — R51 Headache: Secondary | ICD-10-CM | POA: Diagnosis not present

## 2016-03-23 DIAGNOSIS — Z7982 Long term (current) use of aspirin: Secondary | ICD-10-CM | POA: Insufficient documentation

## 2016-03-23 DIAGNOSIS — Z8619 Personal history of other infectious and parasitic diseases: Secondary | ICD-10-CM | POA: Insufficient documentation

## 2016-03-23 LAB — CBC
HCT: 40.6 % (ref 36.0–46.0)
Hemoglobin: 13.5 g/dL (ref 12.0–15.0)
MCH: 27.6 pg (ref 26.0–34.0)
MCHC: 33.3 g/dL (ref 30.0–36.0)
MCV: 83 fL (ref 78.0–100.0)
Platelets: 241 10*3/uL (ref 150–400)
RBC: 4.89 MIL/uL (ref 3.87–5.11)
RDW: 13.7 % (ref 11.5–15.5)
WBC: 7.4 10*3/uL (ref 4.0–10.5)

## 2016-03-23 LAB — CBG MONITORING, ED: Glucose-Capillary: 104 mg/dL — ABNORMAL HIGH (ref 65–99)

## 2016-03-23 LAB — COMPREHENSIVE METABOLIC PANEL
ALT: 14 U/L (ref 14–54)
AST: 16 U/L (ref 15–41)
Albumin: 3.8 g/dL (ref 3.5–5.0)
Alkaline Phosphatase: 94 U/L (ref 38–126)
Anion gap: 9 (ref 5–15)
BUN: 8 mg/dL (ref 6–20)
CO2: 26 mmol/L (ref 22–32)
Calcium: 9.2 mg/dL (ref 8.9–10.3)
Chloride: 108 mmol/L (ref 101–111)
Creatinine, Ser: 0.79 mg/dL (ref 0.44–1.00)
GFR calc Af Amer: >60 mL/min (ref >60)
GFR calc non Af Amer: >60 mL/min (ref >60)
Glucose, Bld: 109 mg/dL — ABNORMAL HIGH (ref 65–99)
Potassium: 3.4 mmol/L — ABNORMAL LOW (ref 3.5–5.1)
Sodium: 143 mmol/L (ref 135–145)
Total Bilirubin: 0.7 mg/dL (ref 0.3–1.2)
Total Protein: 6.5 g/dL (ref 6.5–8.1)

## 2016-03-23 LAB — DIFFERENTIAL
Basophils Absolute: 0 10*3/uL (ref 0.0–0.1)
Basophils Relative: 0 %
Eosinophils Absolute: 0.1 10*3/uL (ref 0.0–0.7)
Eosinophils Relative: 2 %
Lymphocytes Relative: 25 %
Lymphs Abs: 1.9 10*3/uL (ref 0.7–4.0)
Monocytes Absolute: 0.3 10*3/uL (ref 0.1–1.0)
Monocytes Relative: 4 %
Neutro Abs: 5.1 10*3/uL (ref 1.7–7.7)
Neutrophils Relative %: 69 %

## 2016-03-23 LAB — I-STAT CHEM 8, ED
BUN: 8 mg/dL (ref 6–20)
Calcium, Ion: 1.15 mmol/L (ref 1.13–1.30)
Chloride: 105 mmol/L (ref 101–111)
Creatinine, Ser: 0.8 mg/dL (ref 0.44–1.00)
Glucose, Bld: 104 mg/dL — ABNORMAL HIGH (ref 65–99)
HCT: 43 % (ref 36.0–46.0)
Hemoglobin: 14.6 g/dL (ref 12.0–15.0)
Potassium: 3.4 mmol/L — ABNORMAL LOW (ref 3.5–5.1)
Sodium: 143 mmol/L (ref 135–145)
TCO2: 26 mmol/L (ref 0–100)

## 2016-03-23 LAB — PROTIME-INR
INR: 1.12 (ref 0.00–1.49)
Prothrombin Time: 14.6 seconds (ref 11.6–15.2)

## 2016-03-23 LAB — I-STAT TROPONIN, ED: Troponin i, poc: 0 ng/mL (ref 0.00–0.08)

## 2016-03-23 LAB — APTT: aPTT: 31 seconds (ref 24–37)

## 2016-03-23 MED ORDER — LORAZEPAM 2 MG/ML IJ SOLN
1.0000 mg | Freq: Once | INTRAMUSCULAR | Status: AC
  Administered 2016-03-23: 1 mg via INTRAVENOUS
  Filled 2016-03-23: qty 1

## 2016-03-23 MED ORDER — LORAZEPAM 1 MG PO TABS: 1.0000 mg | ORAL_TABLET | Freq: Four times a day (QID) | ORAL | Status: DC | PRN

## 2016-03-23 NOTE — ED Provider Notes (Signed)
CSN: [id number]     Arrival date & time 03/23/16  1148 History   First MD Initiated Contact with Patient 03/23/16 1350     Chief Complaint  Patient presents with  . Dizziness     (Consider location/radiation/quality/duration/timing/severity/associated sxs/prior Treatment) HPI Comments: Patient presents to the emergency department with chief complaint of dizziness. She states that she has had dizziness for the past 3 weeks. She was evaluated by her primary care doctor about 2 weeks ago, and was diagnosed with vertigo. She has been taking meclizine with no relief. She states that she feels off balance, and has room is spinning sensation. It is difficult for her to say if symptoms are worsened with head movement or motion. She denies any slurred speech, vision changes, numbness, weakness, or tingling. There are no modifying factors. She does report having associated headache.  The history is provided by the patient. No language interpreter was used.    Past Medical History  Diagnosis Date  . ABDOMINAL PAIN, RECURRENT 03/29/2008  . ANXIETY 10/07/2007  . Candidiasis of mouth 05/30/2010  . GERD 07/25/2007  . HYPERTENSION 07/25/2007  . Hernia, hiatal   . Stroke [facility])    Past Surgical History  Procedure Laterality Date  . Abdominal hysterectomy    . Knee arthroscopy    . Breast surgery      bx   Family History  Problem Relation Age of Onset  . Kidney disease Neg Hx   . Stroke Neg Hx   . Diabetes Neg Hx    Social History  Substance Use Topics  . Smoking status: Never Smoker   . Smokeless tobacco: Never Used  . Alcohol Use: Yes     Comment: occasional   OB History    No data available     Review of Systems  Constitutional: Negative for fever and chills.  Respiratory: Negative for shortness of breath.   Cardiovascular: Negative for chest pain.  Gastrointestinal: Negative for nausea, vomiting, diarrhea and constipation.  Genitourinary: Negative for dysuria.  Neurological:  Positive for dizziness and headaches.  All other systems reviewed and are negative.     Allergies  Lisinopril; Codeine phosphate; and Morphine and related  Home Medications   Prior to Admission medications   Medication Sig Start Date End Date Taking? Authorizing Provider  amLODipine (NORVASC) 2.5 MG tablet TAKE 1 TABLET(2.5 MG) BY MOUTH DAILY 01/06/16   Marletta Lor, MD  aspirin EC 81 MG tablet Take 81 mg by mouth every morning.     Historical Provider, MD  clonazePAM (KLONOPIN) 1 MG tablet TAKE 1 TABLET BY MOUTH TWICE DAILY AS NEEDED 09/30/15   Marletta Lor, MD  FLUoxetine (PROZAC) 40 MG capsule Take 1 capsule (40 mg total) by mouth daily. 01/06/16   Marletta Lor, MD  levothyroxine (SYNTHROID, LEVOTHROID) 25 MCG tablet Take 1 tablet (25 mcg total) by mouth daily. 09/10/15   Marletta Lor, MD  lisinopril (PRINIVIL,ZESTRIL) 20 MG tablet TK 1 T PO DAILY 05/25/15   Historical Provider, MD  methylPREDNISolone (MEDROL DOSEPAK) 4 MG TBPK tablet Take as directed 10/23/15   Dorothyann Peng, NP  pantoprazole (PROTONIX) 40 MG tablet TAKE 1 TABLET(40 MG) BY MOUTH DAILY 01/06/16   Marletta Lor, MD  triamcinolone cream (KENALOG) 0.1 % Apply 1 application topically 2 (two) times daily. 03/09/16   Marletta Lor, MD   BP 176/91 mmHg  Pulse 61  Temp(Src) 97.9 F (36.6 C) (Oral)  Resp 18  SpO2 100% Physical Exam  Constitutional: She is oriented to person, place, and time. She appears well-developed and well-nourished.  HENT:  Head: Normocephalic and atraumatic.  Right Ear: External ear normal.  Left Ear: External ear normal.  Eyes: Conjunctivae and EOM are normal. Pupils are equal, round, and reactive to light.  Neck: Normal range of motion. Neck supple.  No pain with neck flexion, no meningismus  Cardiovascular: Normal rate, regular rhythm and normal heart sounds.  Exam reveals no gallop and no friction rub.   No murmur heard. Pulmonary/Chest: Effort normal and  breath sounds normal. No respiratory distress. She has no wheezes. She has no rales. She exhibits no tenderness.  Abdominal: Soft. Bowel sounds are normal. She exhibits no distension and no mass. There is no tenderness. There is no rebound and no guarding.  Musculoskeletal: Normal range of motion. She exhibits no edema or tenderness.  Normal gait.  Neurological: She is alert and oriented to person, place, and time. She has normal reflexes.  CN 3-12 intact, normal finger to nose, no pronator drift, sensation and strength intact bilaterally.  Skin: Skin is warm and dry.  Psychiatric: She has a normal mood and affect. Her behavior is normal. Judgment and thought content normal.  Nursing note and vitals reviewed.   ED Course  Procedures (including critical care time) Labs Review Labs Reviewed  COMPREHENSIVE METABOLIC PANEL - Abnormal; Notable for the following:    Potassium 3.4 (*)    Glucose, Bld 109 (*)    All other components within normal limits  CBG MONITORING, ED - Abnormal; Notable for the following:    Glucose-Capillary 104 (*)    All other components within normal limits  I-STAT CHEM 8, ED - Abnormal; Notable for the following:    Potassium 3.4 (*)    Glucose, Bld 104 (*)    All other components within normal limits  PROTIME-INR  APTT  CBC  DIFFERENTIAL  I-STAT TROPOININ, ED    Imaging Review Ct Head Wo Contrast  03/23/2016  CLINICAL DATA:  Dizziness for 2 weeks.  History of stroke. EXAM: CT HEAD WITHOUT CONTRAST TECHNIQUE: Contiguous axial images were obtained from the base of the skull through the vertex without contrast. COMPARISON:  08/29/2015 FINDINGS: No evidence for acute hemorrhage, mass lesion, midline shift, hydrocephalus or large infarct. Stable low-density in the periventricular white matter. Visualized paranasal sinuses are clear. Visualized mastoid air cells are aerated. No calvarial fracture. Prominent sulci in the posterior parietal lobes may be related to  atrophy. IMPRESSION: No acute intracranial abnormality. Evidence for chronic small vessel ischemic changes. Electronically Signed   By: Markus Daft M.D.   On: 03/23/2016 12:24   I have personally reviewed and evaluated these images and lab results as part of my medical decision-making.   EKG Interpretation None      MDM   Final diagnoses:  Dizziness    Patient with dizziness 3 weeks. States that seems like the room is spinning. She also reports associated headache. She states that this is a daily problem for her. She was treated with meclizine by her primary care doctor, but has not seen any relief with this. Came today for worsening dizziness. Initial labs and workup were unremarkable. CT scan of the head is negative for acute stroke. Patient does report having history of stroke in the past. At this time, feel that we will need to proceed with MRI of brain to rule out cerebellar stroke. I will give the patient does Ativan, this may also help with her  symptoms fit his vertigo. At this time, anticipate discharge to home if MRI is negative.  MRI is negative.  Will discharge home with some ativan.  Patient is able to ambulate easily.    Montine Circle, PA-C 03/23/16 [place], MD 03/29/16 2121

## 2016-03-23 NOTE — ED Notes (Signed)
Pt reports dizziness x 3 weeks. Pt was evaluated by her PCP and diagnosed with vertigo. Pt was given antivert with no relief. Pt alert x4. NAD at this time.

## 2016-03-23 NOTE — Discharge Instructions (Signed)

## 2016-03-26 ENCOUNTER — Other Ambulatory Visit: Payer: Self-pay | Admitting: *Deleted

## 2016-03-26 NOTE — Telephone Encounter (Signed)
Patient seen on 4/24 in ED with complaints of dizziness. Per ED Dr.'s note, MRI of brain was negative and patient was sent home with ativan.  ------------------------------------------------------------------------------------------------------------------------------------------------------------------------------ PLEASE NOTE: All timestamps contained within this report are represented as [place] Standard Time. CONFIDENTIALTY NOTICE: This fax transmission is intended only for the addressee. It contains information that is legally privileged, confidential or otherwise protected from use or disclosure. If you are not the intended recipient, you are strictly prohibited from reviewing, disclosing, copying using or disseminating any of this information or taking any action in reliance on or regarding this information. If you have received this fax in error, please notify us immediately by telephone so that we can arrange for its return to [place]. Phone: [phone], Toll-Free: [phone], Fax: [phone] Page: 1 of 1 Call Id: [id number] [place] [facility] - Client [place] Patient Name: Erica Morris Gender: Female DOB: 19-Mar-1946 Age: 70 Y 54 M 21 D Return Phone Number: [phone] (Primary) Address: City/State/Zip: [place] [address] Client [facility] [place] [facility] - Client Client Site [place] - Day Physician Simonne Martinet Contact Type Call Who Is Calling Patient / Member / Family / Caregiver Call Type Triage / Clinical Relationship To Patient Self Return Phone Number [phone] (Primary) Chief Complaint Dizziness Reason for Call Symptomatic / Request for Health Information Initial Comment caller states she has dizziness Appointment Disposition EMR Caller Not Reached Info pasted into Epic No Translation No Nurse Assessment Guidelines Guideline Title Affirmed Question Affirmed Notes  Nurse Date/Time (Eastern Time) Disp. Time Eilene Ghazi Time) Disposition Final User 03/26/2016 9:56:48 AM FINAL ATTEMPT MADE - no message left Yes Dimas Chyle, RN, Dellis Filbert

## 2016-03-26 NOTE — Telephone Encounter (Signed)
Please see message and advise 

## 2016-03-26 NOTE — Telephone Encounter (Signed)
Tried to contact pt, unable to leave message voicemail not set up. Will try again later.

## 2016-03-26 NOTE — Telephone Encounter (Signed)
Tried to contact pt again, unable to leave message voicemail not set up. Will try again later.

## 2016-03-26 NOTE — Telephone Encounter (Signed)
Meclizine 25 mg every 6 hours when necessary dizziness.  #50.  Follow-up office visit next week.  If unimproved

## 2016-03-27 NOTE — Telephone Encounter (Signed)
Attempted to contact patient. unable to leave message voicemail not set up

## 2016-03-30 NOTE — Telephone Encounter (Signed)
Left message on voicemail to call office on other number.

## 2016-03-31 MED ORDER — LORAZEPAM 1 MG PO TABS: 1.0000 mg | ORAL_TABLET | Freq: Four times a day (QID) | ORAL | Status: DC | PRN

## 2016-03-31 NOTE — Telephone Encounter (Signed)
Okay to refill? 

## 2016-03-31 NOTE — Telephone Encounter (Signed)
Spoke to pt, told her Rx for Lorazepam was called into pharmacy for her and also Dr.K wanted me to send a Rx for Meclizine for dizziness. Pt verbalized understanding and said she already has Meclizine. Told her okay the Rx for Lorazepam should be ready shortly. Asked pt how she was? Pt said I am doing fine since taking my medication. Told her okay please call and schedule follow up with Dr.K. Pt verbalized understanding.

## 2016-03-31 NOTE — Telephone Encounter (Signed)
Dr. Raliegh Ip, pt requesting refill on Lorazepam 1 mg tablet that she was given in the hospital. Pt was only given 10 tablets. Pt has no way to get here for a follow up. Please advise if I can refill Lorazepam?

## 2016-03-31 NOTE — Telephone Encounter (Signed)
Pt states she went to the ED on 4/24. Pt was prescribed LORazepam (ATIVAN) 1 MG tablet  Pt was given 10 tabs and wants a refill of this med. Pt refused appointment, stating she has no way to get here.  Pt can be reached at number above. If number does not go to her directly, pt is staying in room 252.  [facility] [address] [address] [place]

## 2016-04-06 ENCOUNTER — Telehealth: Payer: Self-pay | Admitting: Internal Medicine

## 2016-04-06 NOTE — Telephone Encounter (Signed)
Pt had [facility]. They carry a different brand of generic and wanted to advise you of the change. Please call pharmacy to let them you know you are ok with the change. [phone] ask for Lennette Bihari or [place].

## 2016-04-07 NOTE — Telephone Encounter (Signed)
Called [facility] spoke to [name], told him Dr.K said okay with change for Levothyroxine. [name] verbalized understanding.  Tried to contact pt on mobile, no answer will try again tomorrow.

## 2016-04-07 NOTE — Telephone Encounter (Signed)
Okay with change Suggest repeat TSH in 6-8 weeks

## 2016-04-07 NOTE — Telephone Encounter (Signed)
Please see message and advise 

## 2016-04-08 NOTE — Telephone Encounter (Signed)
Tried to contact pt again, no answer.

## 2016-04-27 ENCOUNTER — Emergency Department (HOSPITAL_COMMUNITY): Payer: Commercial Managed Care - HMO

## 2016-04-27 ENCOUNTER — Encounter (HOSPITAL_COMMUNITY): Payer: Self-pay | Admitting: Emergency Medicine

## 2016-04-27 ENCOUNTER — Emergency Department (HOSPITAL_COMMUNITY)
Admission: EM | Admit: 2016-04-27 | Discharge: 2016-04-27 | Disposition: A | Discharge status: Home / Self Care | Payer: Commercial Managed Care - HMO | Attending: Emergency Medicine | Admitting: Emergency Medicine

## 2016-04-27 DIAGNOSIS — Y999 Unspecified external cause status: Secondary | ICD-10-CM | POA: Insufficient documentation

## 2016-04-27 DIAGNOSIS — S80862A Insect bite (nonvenomous), left lower leg, initial encounter: Secondary | ICD-10-CM | POA: Insufficient documentation

## 2016-04-27 DIAGNOSIS — Z79899 Other long term (current) drug therapy: Secondary | ICD-10-CM | POA: Diagnosis not present

## 2016-04-27 DIAGNOSIS — R42 Dizziness and giddiness: Secondary | ICD-10-CM | POA: Diagnosis not present

## 2016-04-27 DIAGNOSIS — I10 Essential (primary) hypertension: Secondary | ICD-10-CM | POA: Diagnosis not present

## 2016-04-27 DIAGNOSIS — Z7982 Long term (current) use of aspirin: Secondary | ICD-10-CM | POA: Insufficient documentation

## 2016-04-27 DIAGNOSIS — Z8673 Personal history of transient ischemic attack (TIA), and cerebral infarction without residual deficits: Secondary | ICD-10-CM | POA: Diagnosis not present

## 2016-04-27 DIAGNOSIS — S80861A Insect bite (nonvenomous), right lower leg, initial encounter: Secondary | ICD-10-CM | POA: Diagnosis present

## 2016-04-27 DIAGNOSIS — Y929 Unspecified place or not applicable: Secondary | ICD-10-CM | POA: Diagnosis not present

## 2016-04-27 DIAGNOSIS — W57XXXA Bitten or stung by nonvenomous insect and other nonvenomous arthropods, initial encounter: Secondary | ICD-10-CM | POA: Diagnosis not present

## 2016-04-27 DIAGNOSIS — Y939 Activity, unspecified: Secondary | ICD-10-CM | POA: Diagnosis not present

## 2016-04-27 LAB — BASIC METABOLIC PANEL
Anion gap: 7 (ref 5–15)
BUN: 13 mg/dL (ref 6–20)
CO2: 26 mmol/L (ref 22–32)
Calcium: 9.1 mg/dL (ref 8.9–10.3)
Chloride: 105 mmol/L (ref 101–111)
Creatinine, Ser: 1.18 mg/dL — ABNORMAL HIGH (ref 0.44–1.00)
GFR calc Af Amer: 54 mL/min — ABNORMAL LOW (ref >60)
GFR calc non Af Amer: 46 mL/min — ABNORMAL LOW (ref >60)
Glucose, Bld: 101 mg/dL — ABNORMAL HIGH (ref 65–99)
Potassium: 3.4 mmol/L — ABNORMAL LOW (ref 3.5–5.1)
Sodium: 138 mmol/L (ref 135–145)

## 2016-04-27 LAB — URINALYSIS, ROUTINE W REFLEX MICROSCOPIC
Glucose, UA: NEGATIVE mg/dL
Hgb urine dipstick: NEGATIVE
Ketones, ur: 15 mg/dL — AB
Leukocytes, UA: NEGATIVE
Nitrite: NEGATIVE
Protein, ur: 30 mg/dL — AB
Specific Gravity, Urine: 1.046 — ABNORMAL HIGH (ref 1.005–1.030)
pH: 5 (ref 5.0–8.0)

## 2016-04-27 LAB — CBC
HCT: 39.3 % (ref 36.0–46.0)
Hemoglobin: 12.8 g/dL (ref 12.0–15.0)
MCH: 26.9 pg (ref 26.0–34.0)
MCHC: 32.6 g/dL (ref 30.0–36.0)
MCV: 82.7 fL (ref 78.0–100.0)
Platelets: 255 10*3/uL (ref 150–400)
RBC: 4.75 MIL/uL (ref 3.87–5.11)
RDW: 13.3 % (ref 11.5–15.5)
WBC: 8.7 10*3/uL (ref 4.0–10.5)

## 2016-04-27 LAB — URINE MICROSCOPIC-ADD ON
Bacteria, UA: NONE SEEN
RBC / HPF: NONE SEEN RBC/hpf (ref 0–5)
Squamous Epithelial / LPF: NONE SEEN
WBC, UA: NONE SEEN WBC/hpf (ref 0–5)

## 2016-04-27 MED ORDER — MECLIZINE HCL 25 MG PO TABS
25.0000 mg | ORAL_TABLET | Freq: Once | ORAL | Status: AC
  Administered 2016-04-27: 25 mg via ORAL
  Filled 2016-04-27: qty 1

## 2016-04-27 MED ORDER — SODIUM CHLORIDE 0.9 % IV BOLUS (SEPSIS)
1000.0000 mL | Freq: Once | INTRAVENOUS | Status: AC
  Administered 2016-04-27: 1000 mL via INTRAVENOUS

## 2016-04-27 NOTE — ED Provider Notes (Signed)
CSN: [id number]     Arrival date & time 04/27/16  1752 History   First MD Initiated Contact with Patient 04/27/16 1814     Chief Complaint  Patient presents with  . Fall  . Bed Bugs      (Consider location/radiation/quality/duration/timing/severity/associated sxs/prior Treatment) HPI Comments: 70yo F w/ PMH including HTN, CVA, GERD, anxiety, vertigo who p/w dizziness. Patient reports a 2 month history of dizziness which she describes as room spinning sensation. She denies any relation to her positioning, whether sitting or standing. She states that she has fallen multiple times in the past month, no head injury or loss of consciousness during these falls. She was evaluated a few months ago for the same dizziness symptoms. She takes meclizine sometimes once daily but does not report any relief. No headache or visual changes. She also notes that last night her feet were tingling and she felt like "these were biting her." She has noticed by a marks on her lower legs and she is concerned about bedbugs at her motel. She states she will be moving in with her daughter next week.  Patient is a 70 y.o. female presenting with fall. The history is provided by the patient.  Fall    Past Medical History  Diagnosis Date  . ABDOMINAL PAIN, RECURRENT 03/29/2008  . ANXIETY 10/07/2007  . Candidiasis of mouth 05/30/2010  . GERD 07/25/2007  . HYPERTENSION 07/25/2007  . Hernia, hiatal   . Stroke [facility])    Past Surgical History  Procedure Laterality Date  . Abdominal hysterectomy    . Knee arthroscopy    . Breast surgery      bx   Family History  Problem Relation Age of Onset  . Kidney disease Neg Hx   . Stroke Neg Hx   . Diabetes Neg Hx    Social History  Substance Use Topics  . Smoking status: Never Smoker   . Smokeless tobacco: Never Used  . Alcohol Use: Yes     Comment: occasional   OB History    No data available     Review of Systems 10 Systems reviewed and are negative for acute change  except as noted in the HPI.   Allergies  Lisinopril; Codeine phosphate; and Morphine and related  Home Medications   Prior to Admission medications   Medication Sig Start Date End Date Taking? Authorizing Provider  amLODipine (NORVASC) 2.5 MG tablet TAKE 1 TABLET(2.5 MG) BY MOUTH DAILY 01/06/16   Marletta Lor, MD  aspirin EC 81 MG tablet Take 81 mg by mouth every morning.     Historical Provider, MD  clonazePAM (KLONOPIN) 1 MG tablet TAKE 1 TABLET BY MOUTH TWICE DAILY AS NEEDED Patient taking differently: TAKE 1 TABLET BY MOUTH TWICE DAILY AS NEEDED FOR ANXIETY 09/30/15   Marletta Lor, MD  FLUoxetine (PROZAC) 40 MG capsule Take 1 capsule (40 mg total) by mouth daily. 01/06/16   Marletta Lor, MD  levothyroxine (SYNTHROID, LEVOTHROID) 25 MCG tablet Take 1 tablet (25 mcg total) by mouth daily. 09/10/15   Marletta Lor, MD  LORazepam (ATIVAN) 1 MG tablet Take 1 tablet (1 mg total) by mouth every 6 (six) hours as needed for anxiety. 03/31/16   Marletta Lor, MD  methylPREDNISolone (MEDROL DOSEPAK) 4 MG TBPK tablet Take as directed Patient not taking: Reported on 03/23/2016 10/23/15   Dorothyann Peng, NP  pantoprazole (PROTONIX) 40 MG tablet TAKE 1 TABLET(40 MG) BY MOUTH DAILY Patient not taking: Reported on  03/23/2016 01/06/16   Marletta Lor, MD  triamcinolone cream (KENALOG) 0.1 % Apply 1 application topically 2 (two) times daily. Patient not taking: Reported on 03/23/2016 03/09/16   Marletta Lor, MD   BP 168/86 mmHg  Pulse 64  Temp(Src) 98.3 F (36.8 C) (Oral)  Resp 16  SpO2 97% Physical Exam  Constitutional: She is oriented to person, place, and time. She appears well-developed and well-nourished. No distress.  Awake, alert  HENT:  Head: Normocephalic and atraumatic.  Eyes: Conjunctivae and EOM are normal. Pupils are equal, round, and reactive to light.  Neck: Neck supple.  Cardiovascular: Normal rate, regular rhythm and normal heart sounds.   No  murmur heard. Pulmonary/Chest: Effort normal and breath sounds normal. No respiratory distress.  Abdominal: Soft. Bowel sounds are normal. She exhibits no distension.  Musculoskeletal: She exhibits no edema.  Neurological: She is alert and oriented to person, place, and time. She has normal reflexes. No cranial nerve deficit. She exhibits normal muscle tone.  Fluent speech, normal finger-to-nose testing, negative pronator drift, 2 beats of clonus b/l feet 5/5 strength and normal sensation x all 4 extremities  Skin: Skin is warm and dry.  Multiple discrete macules w/ surrounding erythema around ankles, no drainage  Psychiatric: She has a normal mood and affect. Judgment and thought content normal.  Nursing note and vitals reviewed.   ED Course  Procedures (including critical care time) Labs Review Labs Reviewed  BASIC METABOLIC PANEL - Abnormal; Notable for the following:    Potassium 3.4 (*)    Glucose, Bld 101 (*)    Creatinine, Ser 1.18 (*)    GFR calc non Af Amer 46 (*)    GFR calc Af Amer 54 (*)    All other components within normal limits  URINALYSIS, ROUTINE W REFLEX MICROSCOPIC (NOT AT [facility]) - Abnormal; Notable for the following:    Color, Urine AMBER (*)    APPearance HAZY (*)    Specific Gravity, Urine 1.046 (*)    Bilirubin Urine SMALL (*)    Ketones, ur 15 (*)    Protein, ur 30 (*)    All other components within normal limits  URINE MICROSCOPIC-ADD ON - Abnormal; Notable for the following:    Crystals CA OXALATE CRYSTALS (*)    All other components within normal limits  CBC  CBG MONITORING, ED    Imaging Review Ct Head Wo Contrast  04/27/2016  CLINICAL DATA:  Fall today.  Dizziness. EXAM: CT HEAD WITHOUT CONTRAST TECHNIQUE: Contiguous axial images were obtained from the base of the skull through the vertex without intravenous contrast. COMPARISON:  CT head 03/23/2016 FINDINGS: Ventricle size normal.  Cerebral volume normal for age Hypodensity in the periventricular  white matter bilaterally unchanged and consistent with chronic microvascular ischemia. Negative for acute infarct.  Negative for acute hemorrhage or mass. Normal calvarium. IMPRESSION: Chronic microvascular ischemic change.  No acute abnormality. Electronically Signed   By: Franchot Gallo M.D.   On: 04/27/2016 20:03   I have personally reviewed and evaluated these lab results as part of my medical decision-making.   EKG Interpretation   Date/Time:  Monday Apr 27 2016 18:14:33 EDT Ventricular Rate:  64 PR Interval:  133 QRS Duration: 87 QT Interval:  434 QTC Calculation: 448 R Axis:   2 Text Interpretation:  Sinus rhythm Borderline T abnormalities, anterior  leads No significant change since last tracing Confirmed by LITTLE MD,  RACHEL [phone]) on 04/27/2016 6:45:23 PM     Medications  sodium chloride 0.9 % bolus 1,000 mL (0 mLs Intravenous Stopped 04/27/16 2030)  meclizine (ANTIVERT) tablet 25 mg (25 mg Oral Given 04/27/16 1935)    MDM   Final diagnoses:  Vertigo  Bed bug bite   Pt presents with ongoing dizziness similar to the dizziness she had a few months ago for which she had MRI here in the ED. She also reports feeling like these were biting her last night in bed. On exam, she was awake and alert, comfortable and in no acute distress. Vital signs notable for mild hypertension. EKG unchanged from previous. Neurologic exam showed 2 beats of clonus in bilateral legs but otherwise unremarkable. Obtained a head CT given the patient's history of stroke and her frequent falls. Head CT was negative for acute process. Labwork notable only for evidence of mild dehydration with creatinine 1.18. Otherwise unremarkable. Gave the patient an IV fluid bolus as well as meclizine. She was able to ambulate without assistance multiple times in the ED and on reexamination was well-appearing and stated that she wanted to go home. She has had a previous MRI for the same symptoms which was negative, therefore  I feel her vertigo is unlikely to be due to acute intracranial process. Discussed follow-up with PCP and reviewed supportive care including use of meclizine. Regarding her rash on her legs, the rash is consistent with bed bugs and the patient gives a description of bedbugs at her motel. Discussed treatment of bedbugs and importance of waiting motel soon as possible. Patient voiced understanding was discharged in satisfactory condition.    Sharlett Iles, MD 04/27/16 2052

## 2016-04-27 NOTE — ED Notes (Signed)
Patient transported to CT 

## 2016-04-27 NOTE — ED Notes (Signed)
Pt c/o fall today after getting dizzy, states she has been dizzy x 2 months, fallen about 6 times in past month. Pt states last night her feet were tingling, felt like "bees were biting" her, patient afraid to be alone. Pt states her tongue became more dark two days ago. Pt unsure of time of fall. No head injury, no LOC.

## 2016-04-28 ENCOUNTER — Other Ambulatory Visit: Payer: Self-pay | Admitting: Internal Medicine

## 2016-05-06 ENCOUNTER — Ambulatory Visit (INDEPENDENT_AMBULATORY_CARE_PROVIDER_SITE_OTHER): Payer: Commercial Managed Care - HMO | Admitting: Ophthalmology

## 2016-05-12 ENCOUNTER — Encounter: Payer: Self-pay | Admitting: Internal Medicine

## 2016-05-12 ENCOUNTER — Ambulatory Visit (INDEPENDENT_AMBULATORY_CARE_PROVIDER_SITE_OTHER): Payer: Commercial Managed Care - HMO | Admitting: Internal Medicine

## 2016-05-12 VITALS — BP 146/98 | HR 63 | Temp 98.4°F | Ht 64.0 in | Wt 189.0 lb

## 2016-05-12 DIAGNOSIS — F411 Generalized anxiety disorder: Secondary | ICD-10-CM

## 2016-05-12 DIAGNOSIS — I10 Essential (primary) hypertension: Secondary | ICD-10-CM

## 2016-05-12 DIAGNOSIS — H8110 Benign paroxysmal vertigo, unspecified ear: Secondary | ICD-10-CM | POA: Diagnosis not present

## 2016-05-12 MED ORDER — MECLIZINE HCL 25 MG PO TABS: 25.0000 mg | ORAL_TABLET | Freq: Three times a day (TID) | ORAL | Status: DC | PRN

## 2016-05-12 MED ORDER — AMLODIPINE BESYLATE 5 MG PO TABS: 5.0000 mg | ORAL_TABLET | Freq: Every day | ORAL | Status: DC

## 2016-05-12 MED ORDER — ONDANSETRON HCL 4 MG PO TABS: 4.0000 mg | ORAL_TABLET | Freq: Two times a day (BID) | ORAL | Status: DC

## 2016-05-12 NOTE — Patient Instructions (Signed)
Take medications as directed  Follow-up in 4 weeks  Call if you develop any new or worsening symptoms

## 2016-05-12 NOTE — Progress Notes (Signed)
Subjective:    Patient ID: Erica Morris, female    DOB: 1946/10/30, 70 y.o.   MRN: [id number]  HPI 70 year old patient who presents today with a chief complaint of persistent vertigo.  She was evaluated in the ED in both April and May, due to similar complaints.  Evaluation included a brain MRI that revealed extensive microvascular changes including changes in the pons.  She states that the sense of spinning is continuous and notch is aggravated by movement.  She complains of anxiety and panic attacks at night.  She has been on chronic benzodiazepine.  She has essential hypertension, controlled on amlodipine  ED records reviewed  Past Medical History  Diagnosis Date  . ABDOMINAL PAIN, RECURRENT 03/29/2008  . ANXIETY 10/07/2007  . Candidiasis of mouth 05/30/2010  . GERD 07/25/2007  . HYPERTENSION 07/25/2007  . Hernia, hiatal   . Stroke [facility])      Social History   Social History  . Marital Status: Widowed    Spouse Name: N/A  . Number of Children: N/A  . Years of Education: N/A   Occupational History  . Not on file.   Social History Main Topics  . Smoking status: Never Smoker   . Smokeless tobacco: Never Used  . Alcohol Use: Yes     Comment: occasional  . Drug Use: No  . Sexual Activity: No   Other Topics Concern  . Not on file   Social History Narrative    Past Surgical History  Procedure Laterality Date  . Abdominal hysterectomy    . Knee arthroscopy    . Breast surgery      bx    Family History  Problem Relation Age of Onset  . Kidney disease Neg Hx   . Stroke Neg Hx   . Diabetes Neg Hx     Allergies  Allergen Reactions  . Lisinopril Cough  . Codeine Phosphate Itching  . Morphine And Related Itching    Burning sensation and itchiness with IV morphine    Current Outpatient Prescriptions on File Prior to Visit  Medication Sig Dispense Refill  . aspirin EC 81 MG tablet Take 81 mg by mouth every morning.     . clonazePAM (KLONOPIN) 1 MG tablet TAKE 1  TABLET BY MOUTH TWICE DAILY AS NEEDED (Patient taking differently: TAKE 1 TABLET BY MOUTH TWICE DAILY AS NEEDED FOR ANXIETY) 60 tablet 2  . FLUoxetine (PROZAC) 40 MG capsule Take 1 capsule (40 mg total) by mouth daily. 90 capsule 1  . levothyroxine (SYNTHROID, LEVOTHROID) 25 MCG tablet Take 1 tablet (25 mcg total) by mouth daily. 90 tablet 3  . pantoprazole (PROTONIX) 40 MG tablet TAKE 1 TABLET(40 MG) BY MOUTH DAILY 30 tablet 5  . triamcinolone cream (KENALOG) 0.1 % Apply 1 application topically 2 (two) times daily. (Patient not taking: Reported on 03/23/2016) 30 g 0   No current facility-administered medications on file prior to visit.    BP 146/98 mmHg  Pulse 63  Temp(Src) 98.4 F (36.9 C) (Oral)  Ht 5\' 4"  (1.626 m)  Wt 189 lb (85.73 kg)  BMI 32.43 kg/m2  SpO2 98%      Review of Systems  HENT: Negative for congestion, dental problem, hearing loss, rhinorrhea, sinus pressure, sore throat and tinnitus.   Eyes: Negative for pain, discharge and visual disturbance.  Respiratory: Negative for cough and shortness of breath.   Cardiovascular: Negative for chest pain, palpitations and leg swelling.  Gastrointestinal: Negative for nausea, vomiting, abdominal pain,  diarrhea, constipation, blood in stool and abdominal distention.  Genitourinary: Negative for dysuria, urgency, frequency, hematuria, flank pain, vaginal bleeding, vaginal discharge, difficulty urinating, vaginal pain and pelvic pain.  Musculoskeletal: Negative for joint swelling, arthralgias and gait problem.  Skin: Negative for rash.  Neurological: Positive for dizziness, weakness and light-headedness. Negative for syncope, speech difficulty, numbness and headaches.  Hematological: Negative for adenopathy.  Psychiatric/Behavioral: Negative for behavioral problems, dysphoric mood and agitation. The patient is nervous/anxious.        Objective:   Physical Exam  Constitutional: She is oriented to person, place, and time. She  appears well-developed and well-nourished. No distress.  Blood pressure 150/100 Anxious  HENT:  Head: Normocephalic.  Right Ear: External ear normal.  Left Ear: External ear normal.  Mouth/Throat: Oropharynx is clear and moist.  Negative.  Right ear unremarkable  Eyes: Conjunctivae and EOM are normal. Pupils are equal, round, and reactive to light.  Physiologic nystagmus only  Neck: Normal range of motion. Neck supple. No thyromegaly present.  Cardiovascular: Normal rate, regular rhythm, normal heart sounds and intact distal pulses.   Pulmonary/Chest: Effort normal and breath sounds normal.  Abdominal: Soft. Bowel sounds are normal. She exhibits no mass. There is no tenderness.  Musculoskeletal: Normal range of motion.  Lymphadenopathy:    She has no cervical adenopathy.  Neurological: She is alert and oriented to person, place, and time.  Normal finger to nose testing  Skin: Skin is warm and dry. No rash noted.  Psychiatric: She has a normal mood and affect. Her behavior is normal.          Assessment & Plan:   Chronic vertigo.  Patient has been on chronic benzodiazepine.  Will add Zofran and meclizine and observe.  If symptoms persist, we'll consider neurology or ENT referral Hypertension.  Suboptimal control.  Will increase amlodipine to 5 mg daily Anxiety disorder.  Continue benzodiazepines   Nyoka Cowden, MD

## 2016-05-12 NOTE — Progress Notes (Signed)
Pre visit review using our clinic review tool, if applicable. No additional management support is needed unless otherwise documented below in the visit note. 

## 2016-05-18 ENCOUNTER — Emergency Department (HOSPITAL_COMMUNITY): Payer: Commercial Managed Care - HMO

## 2016-05-18 ENCOUNTER — Emergency Department (HOSPITAL_COMMUNITY)
Admission: EM | Admit: 2016-05-18 | Discharge: 2016-05-18 | Disposition: A | Discharge status: Home / Self Care | Payer: Commercial Managed Care - HMO | Attending: Emergency Medicine | Admitting: Emergency Medicine

## 2016-05-18 ENCOUNTER — Encounter (HOSPITAL_COMMUNITY): Payer: Self-pay | Admitting: Emergency Medicine

## 2016-05-18 ENCOUNTER — Other Ambulatory Visit: Payer: Self-pay

## 2016-05-18 DIAGNOSIS — R42 Dizziness and giddiness: Secondary | ICD-10-CM

## 2016-05-18 DIAGNOSIS — Z7982 Long term (current) use of aspirin: Secondary | ICD-10-CM | POA: Insufficient documentation

## 2016-05-18 DIAGNOSIS — I10 Essential (primary) hypertension: Secondary | ICD-10-CM | POA: Insufficient documentation

## 2016-05-18 DIAGNOSIS — Z8673 Personal history of transient ischemic attack (TIA), and cerebral infarction without residual deficits: Secondary | ICD-10-CM | POA: Diagnosis not present

## 2016-05-18 LAB — URINALYSIS, ROUTINE W REFLEX MICROSCOPIC
Bilirubin Urine: NEGATIVE
Glucose, UA: NEGATIVE mg/dL
Hgb urine dipstick: NEGATIVE
Ketones, ur: NEGATIVE mg/dL
Nitrite: NEGATIVE
Protein, ur: NEGATIVE mg/dL
Specific Gravity, Urine: 1.017 (ref 1.005–1.030)
pH: 5.5 (ref 5.0–8.0)

## 2016-05-18 LAB — BASIC METABOLIC PANEL
Anion gap: 7 (ref 5–15)
BUN: 13 mg/dL (ref 6–20)
CO2: 24 mmol/L (ref 22–32)
Calcium: 9.1 mg/dL (ref 8.9–10.3)
Chloride: 107 mmol/L (ref 101–111)
Creatinine, Ser: 0.81 mg/dL (ref 0.44–1.00)
GFR calc Af Amer: >60 mL/min (ref >60)
GFR calc non Af Amer: >60 mL/min (ref >60)
Glucose, Bld: 126 mg/dL — ABNORMAL HIGH (ref 65–99)
Potassium: 3.2 mmol/L — ABNORMAL LOW (ref 3.5–5.1)
Sodium: 138 mmol/L (ref 135–145)

## 2016-05-18 LAB — URINE MICROSCOPIC-ADD ON

## 2016-05-18 LAB — CBC
HCT: 37.9 % (ref 36.0–46.0)
Hemoglobin: 12.2 g/dL (ref 12.0–15.0)
MCH: 26.8 pg (ref 26.0–34.0)
MCHC: 32.2 g/dL (ref 30.0–36.0)
MCV: 83.3 fL (ref 78.0–100.0)
Platelets: 235 10*3/uL (ref 150–400)
RBC: 4.55 MIL/uL (ref 3.87–5.11)
RDW: 13.5 % (ref 11.5–15.5)
WBC: 8 10*3/uL (ref 4.0–10.5)

## 2016-05-18 LAB — CBG MONITORING, ED: Glucose-Capillary: 128 mg/dL — ABNORMAL HIGH (ref 65–99)

## 2016-05-18 MED ORDER — IOPAMIDOL (ISOVUE-370) INJECTION 76%
100.0000 mL | Freq: Once | INTRAVENOUS | Status: AC | PRN
Start: 2016-05-18 — End: 2016-05-18
  Administered 2016-05-18: 100 mL via INTRAVENOUS

## 2016-05-18 NOTE — ED Notes (Signed)
Per pt, states she has had a lot of changes in med's-states she was placed on Meclozine but is not working-states she is walking around "drunk"-worried she is gonna fall and break hip

## 2016-05-18 NOTE — ED Notes (Signed)
Patient transported to CT at this time. 

## 2016-05-18 NOTE — ED Provider Notes (Signed)
CSN: [id number]     Arrival date & time 05/18/16  1844 History   First MD Initiated Contact with Patient 05/18/16 2029     Chief Complaint  Patient presents with  . Dizziness      Patient is a 70 y.o. female presenting with dizziness. The history is provided by the patient.  Dizziness Associated symptoms: no chest pain, no diarrhea, no headaches, no nausea, no shortness of breath, no vomiting and no weakness   Patient presents with dizziness/vertigo. States she's had for around 3 months. She's been seen in the ER a couple times in by her primary care doctor couple times. States it feels as if she is drunk moving around. No headache. States she does get pain in her right neck and sometimes has to grab it. She's had a negative MRI for the same thing. She has been on Antivert. Does not help. States she is getting worse. She's fallen over a couple times. Has seen her primary care doctor who recommended follow-up with ENT and neurology did not improve. States he got worse when she turns her head to the right.  Past Medical History  Diagnosis Date  . ABDOMINAL PAIN, RECURRENT 03/29/2008  . ANXIETY 10/07/2007  . Candidiasis of mouth 05/30/2010  . GERD 07/25/2007  . HYPERTENSION 07/25/2007  . Hernia, hiatal   . Stroke [facility])    Past Surgical History  Procedure Laterality Date  . Abdominal hysterectomy    . Knee arthroscopy    . Breast surgery      bx   Family History  Problem Relation Age of Onset  . Kidney disease Neg Hx   . Stroke Neg Hx   . Diabetes Neg Hx    Social History  Substance Use Topics  . Smoking status: Never Smoker   . Smokeless tobacco: Never Used  . Alcohol Use: Yes     Comment: occasional   OB History    No data available     Review of Systems  Constitutional: Negative for activity change and appetite change.  Eyes: Negative for pain.  Respiratory: Negative for chest tightness and shortness of breath.   Cardiovascular: Negative for chest pain and leg swelling.   Gastrointestinal: Negative for nausea, vomiting, abdominal pain and diarrhea.  Genitourinary: Negative for flank pain.  Musculoskeletal: Negative for back pain and neck stiffness.  Skin: Negative for rash.  Neurological: Positive for dizziness and light-headedness. Negative for weakness, numbness and headaches.  Psychiatric/Behavioral: Negative for behavioral problems.      Allergies  Lisinopril; Codeine phosphate; and Morphine and related  Home Medications   Prior to Admission medications   Medication Sig Start Date End Date Taking? Authorizing Provider  amLODipine (NORVASC) 5 MG tablet Take 1 tablet (5 mg total) by mouth daily. TAKE 1 TABLET(2.5 MG) BY MOUTH DAILY 05/12/16  Yes Marletta Lor, MD  aspirin EC 81 MG tablet Take 81 mg by mouth every morning.    Yes Historical Provider, MD  FLUoxetine (PROZAC) 40 MG capsule Take 1 capsule (40 mg total) by mouth daily. 01/06/16  Yes Marletta Lor, MD  levothyroxine (SYNTHROID, LEVOTHROID) 25 MCG tablet Take 1 tablet (25 mcg total) by mouth daily. 09/10/15  Yes Marletta Lor, MD  meclizine (ANTIVERT) 25 MG tablet Take 1 tablet (25 mg total) by mouth 3 (three) times daily as needed for dizziness. 05/12/16  Yes Marletta Lor, MD  pantoprazole (PROTONIX) 40 MG tablet TAKE 1 TABLET(40 MG) BY MOUTH DAILY 01/06/16  Yes  Marletta Lor, MD  RaNITidine HCl (ACID REDUCER PO) Take 1 tablet by mouth daily.   Yes Historical Provider, MD  clonazePAM (KLONOPIN) 1 MG tablet TAKE 1 TABLET BY MOUTH TWICE DAILY AS NEEDED Patient not taking: Reported on 05/18/2016 09/30/15   Marletta Lor, MD  ondansetron (ZOFRAN) 4 MG tablet Take 1 tablet (4 mg total) by mouth 2 (two) times daily. Patient not taking: Reported on 05/18/2016 05/12/16   Marletta Lor, MD  triamcinolone cream (KENALOG) 0.1 % Apply 1 application topically 2 (two) times daily. Patient not taking: Reported on 03/23/2016 03/09/16   Marletta Lor, MD   BP 167/77  mmHg  Pulse 58  Temp(Src) 98 F (36.7 C) (Oral)  Resp 16  SpO2 100% Physical Exam  Constitutional: She appears well-developed.  HENT:  Head: Atraumatic.  Bilateral TMs normal.  Neck: Neck supple.  Cardiovascular: Normal rate.   Pulmonary/Chest: Effort normal.  Abdominal: Soft. There is no tenderness.  Musculoskeletal: Normal range of motion.  Neurological: She is alert.  mild nystagmus. Finger-nose intact bilaterally. No Romberg. Heel-to-shin normally. Extraocular movements intact.    ED Course  Procedures (including critical care time) Labs Review Labs Reviewed  BASIC METABOLIC PANEL - Abnormal; Notable for the following:    Potassium 3.2 (*)    Glucose, Bld 126 (*)    All other components within normal limits  URINALYSIS, ROUTINE W REFLEX MICROSCOPIC (NOT AT [facility]) - Abnormal; Notable for the following:    Leukocytes, UA SMALL (*)    All other components within normal limits  URINE MICROSCOPIC-ADD ON - Abnormal; Notable for the following:    Squamous Epithelial / LPF 0-5 (*)    Bacteria, UA RARE (*)    All other components within normal limits  CBG MONITORING, ED - Abnormal; Notable for the following:    Glucose-Capillary 128 (*)    All other components within normal limits  CBC    Imaging Review Ct Angio Neck W Or Wo Contrast  05/18/2016  CLINICAL DATA:  Dizziness, on meclizine. RIGHT neck and posterior neck pain. Symptoms worse with turning head. History of stroke, hypertension. EXAM: CT ANGIOGRAPHY NECK TECHNIQUE: Multidetector CT imaging of the neck was performed using the standard protocol during bolus administration of intravenous contrast. Multiplanar CT image reconstructions and MIPs were obtained to evaluate the vascular anatomy. Carotid stenosis measurements (when applicable) are obtained utilizing NASCET criteria, using the distal internal carotid diameter as the denominator. CONTRAST:  100 cc Isovue 370 COMPARISON:  CT HEAD Apr 27, 2016 FINDINGS: Aortic arch:  Normal appearance of the thoracic arch, normal branch pattern. Mild calcific atherosclerosis. The origins of the innominate, left Common carotid artery and subclavian artery are widely patent. Right carotid system: Common carotid artery is widely patent, coursing in a straight line fashion. Normal appearance of the carotid bifurcation without hemodynamically significant stenosis by NASCET criteria. Mild intimal thickening and calcific atherosclerosis. Very mild contour abnormality in the LEFT mid to distal cervical internal carotid artery associated with old. No dissection, hematoma or flow limiting stenosis. Left carotid system: Common carotid artery is widely patent, coursing in a straight line fashion. Normal appearance of the carotid bifurcation without hemodynamically significant stenosis by NASCET criteria. Mild intimal thickening and calcific atherosclerosis. Normal appearance of the included internal carotid artery. Vertebral arteries:Left vertebral artery is dominant. Normal appearance of the vertebral arteries, which appear widely patent. Skeleton: No acute osseous process though bone windows have not been submitted. Other neck: Soft tissues of the neck  are nonacute though, not tailored for evaluation. Moderate C4-5 through C6-7 disc height loss and uncovertebral hypertrophy compatible with degenerative discs. No significant neurocompressive changes. Nuchal ligament calcification. Patient is edentulous. Prominent though not pathologically enlarged reniform scattered lymph nodes. IMPRESSION: No acute vascular process or hemodynamically significant stenosis. Minimal focal luminal irregularity LEFT internal carotid artery suggests fibromuscular dysplasia, less likely old injury. Electronically Signed   By: Elon Alas M.D.   On: 05/18/2016 22:18   I have personally reviewed and evaluated these images and lab results as part of my medical decision-making.   EKG Interpretation   Date/Time:  Monday  May 18 2016 19:23:41 EDT Ventricular Rate:  60 PR Interval:    QRS Duration: 86 QT Interval:  426 QTC Calculation: 426 R Axis:   35 Text Interpretation:  Sinus rhythm Borderline low voltage, extremity leads  Nonspecific repol abnormality, lateral leads Confirmed by Alvino Chapel  MD,  NATHAN [phone]) on 05/19/2016 12:14:17 AM      MDM   Final diagnoses:  Vertigo  Dizziness     Patient with 3 months of dizziness. Worse with turning head and some left-sided neck pain. CT angiography done and was reassuring. Some mild nystagmus but otherwise nonfocal exam. Reviewing old notes. Will have follow-up with ENT and neurology through PCP as planned    Davonna Belling, MD 05/19/16 [phone]

## 2016-05-18 NOTE — Discharge Instructions (Signed)

## 2016-05-18 NOTE — ED Notes (Signed)
Pt ambulating self to restroom with supervision of staff. Pt exhibits steady gait.

## 2016-05-18 NOTE — ED Notes (Signed)
Pt returned from CT °

## 2016-06-04 ENCOUNTER — Other Ambulatory Visit: Payer: Self-pay

## 2016-06-04 ENCOUNTER — Telehealth: Payer: Self-pay | Admitting: Internal Medicine

## 2016-06-04 ENCOUNTER — Other Ambulatory Visit: Payer: Self-pay | Admitting: Internal Medicine

## 2016-06-04 MED ORDER — CLONAZEPAM 1 MG PO TABS: 1.0000 mg | ORAL_TABLET | Freq: Two times a day (BID) | ORAL | Status: DC | PRN

## 2016-06-04 MED ORDER — MECLIZINE HCL 25 MG PO TABS: 25.0000 mg | ORAL_TABLET | Freq: Three times a day (TID) | ORAL | Status: DC | PRN

## 2016-06-04 NOTE — Telephone Encounter (Signed)
Pt need refill for the following Rx's meclizine,clonazepam (KLONOPIN), pantoprazole and amlodipine.    Pharm:  [facility]

## 2016-06-04 NOTE — Telephone Encounter (Signed)
Last filled 09/30/2015 #30, 2rf Last OV 05/12/2016 Pending on 06/09/16 Okay to refill?

## 2016-06-05 MED ORDER — PANTOPRAZOLE SODIUM 40 MG PO TBEC: DELAYED_RELEASE_TABLET | ORAL | Status: DC

## 2016-06-05 MED ORDER — AMLODIPINE BESYLATE 5 MG PO TABS: 5.0000 mg | ORAL_TABLET | Freq: Every day | ORAL | Status: DC

## 2016-06-05 MED ORDER — MECLIZINE HCL 25 MG PO TABS: 25.0000 mg | ORAL_TABLET | Freq: Three times a day (TID) | ORAL | Status: DC | PRN

## 2016-06-05 NOTE — Telephone Encounter (Signed)
Left message on voicemail Rx's sent to pharmacy as requested. Any questions please call office. 

## 2016-06-09 ENCOUNTER — Encounter: Payer: Self-pay | Admitting: Internal Medicine

## 2016-06-09 ENCOUNTER — Ambulatory Visit (INDEPENDENT_AMBULATORY_CARE_PROVIDER_SITE_OTHER): Payer: Commercial Managed Care - HMO | Admitting: Internal Medicine

## 2016-06-09 VITALS — BP 120/80 | HR 78 | Temp 98.3°F | Resp 20 | Ht 64.0 in | Wt 193.0 lb

## 2016-06-09 DIAGNOSIS — I951 Orthostatic hypotension: Secondary | ICD-10-CM | POA: Insufficient documentation

## 2016-06-09 DIAGNOSIS — R42 Dizziness and giddiness: Secondary | ICD-10-CM

## 2016-06-09 DIAGNOSIS — I6990 Unspecified sequelae of unspecified cerebrovascular disease: Secondary | ICD-10-CM | POA: Insufficient documentation

## 2016-06-09 DIAGNOSIS — I10 Essential (primary) hypertension: Secondary | ICD-10-CM

## 2016-06-09 DIAGNOSIS — I679 Cerebrovascular disease, unspecified: Secondary | ICD-10-CM | POA: Diagnosis not present

## 2016-06-09 NOTE — Progress Notes (Signed)
Pre visit review using our clinic review tool, if applicable. No additional management support is needed unless otherwise documented below in the visit note. 

## 2016-06-09 NOTE — Patient Instructions (Addendum)
Limit your sodium (Salt) intake  Please check your blood pressure on a regular basis.  If it is consistently greater than 150/90, please make an office appointment.  Vestibular rehabilitation as discussed  Call or return to clinic prn if these symptoms worsen or fail to improve as anticipated.

## 2016-06-09 NOTE — Progress Notes (Signed)
Subjective:    Patient ID: Erica Morris, female    DOB: [birth year], 70 y.o.   MRN: [id number]  HPI  70 year old patient who is seen today in follow-up.  She has made at least 3 ED visits for recurrent vertigo.  Evaluation has included a brain MRI that revealed chronic microvascular changes, most prominent in the pons.  She feels that she is a very high fall risk due to intermittent vertigo. Medical regimen does include meclizine, and clonazepam.  She has treated hypertension and is on amlodipine.  Past Medical History  Diagnosis Date  . ABDOMINAL PAIN, RECURRENT 03/29/2008  . ANXIETY 10/07/2007  . Candidiasis of mouth 05/30/2010  . GERD 07/25/2007  . HYPERTENSION 07/25/2007  . Hernia, hiatal   . Stroke [facility])      Social History   Social History  . Marital Status: Widowed    Spouse Name: N/A  . Number of Children: N/A  . Years of Education: N/A   Occupational History  . Not on file.   Social History Main Topics  . Smoking status: Never Smoker   . Smokeless tobacco: Never Used  . Alcohol Use: Yes     Comment: occasional  . Drug Use: No  . Sexual Activity: No   Other Topics Concern  . Not on file   Social History Narrative    Past Surgical History  Procedure Laterality Date  . Abdominal hysterectomy    . Knee arthroscopy    . Breast surgery      bx    Family History  Problem Relation Age of Onset  . Kidney disease Neg Hx   . Stroke Neg Hx   . Diabetes Neg Hx     Allergies  Allergen Reactions  . Lisinopril Cough  . Codeine Phosphate Itching  . Morphine And Related Itching    Burning sensation and itchiness with IV morphine    Current Outpatient Prescriptions on File Prior to Visit  Medication Sig Dispense Refill  . amLODipine (NORVASC) 5 MG tablet Take 1 tablet (5 mg total) by mouth daily. TAKE 1 TABLET(2.5 MG) BY MOUTH DAILY 30 tablet 5  . aspirin EC 81 MG tablet Take 81 mg by mouth every morning.     Marland Kitchen FLUoxetine (PROZAC) 40 MG capsule Take 1  capsule (40 mg total) by mouth daily. 90 capsule 1  . levothyroxine (SYNTHROID, LEVOTHROID) 25 MCG tablet Take 1 tablet (25 mcg total) by mouth daily. 90 tablet 3  . meclizine (ANTIVERT) 25 MG tablet Take 1 tablet (25 mg total) by mouth 3 (three) times daily as needed for dizziness. 30 tablet 5  . pantoprazole (PROTONIX) 40 MG tablet TAKE 1 TABLET(40 MG) BY MOUTH DAILY 30 tablet 5  . RaNITidine HCl (ACID REDUCER PO) Take 1 tablet by mouth daily.     No current facility-administered medications on file prior to visit.    BP 120/80 mmHg  Pulse 78  Temp(Src) 98.3 F (36.8 C) (Oral)  Resp 20  Ht 5\' 4"  (1.626 m)  Wt 193 lb (87.544 kg)  BMI 33.11 kg/m2  SpO2 98%     Review of Systems  Constitutional: Negative.   HENT: Negative for congestion, dental problem, hearing loss, rhinorrhea, sinus pressure, sore throat and tinnitus.   Eyes: Negative for pain, discharge and visual disturbance.  Respiratory: Negative for cough and shortness of breath.   Cardiovascular: Negative for chest pain, palpitations and leg swelling.  Gastrointestinal: Negative for nausea, vomiting, abdominal pain, diarrhea, constipation, blood in  stool and abdominal distention.  Genitourinary: Negative for dysuria, urgency, frequency, hematuria, flank pain, vaginal bleeding, vaginal discharge, difficulty urinating, vaginal pain and pelvic pain.  Musculoskeletal: Negative for joint swelling, arthralgias and gait problem.  Skin: Negative for rash.  Neurological: Positive for dizziness and light-headedness. Negative for syncope, speech difficulty, weakness, numbness and headaches.  Hematological: Negative for adenopathy.  Psychiatric/Behavioral: Negative for behavioral problems, dysphoric mood and agitation. The patient is nervous/anxious.        Objective:   Physical Exam  Constitutional: She appears well-developed. No distress.  Neurological:  Nonfocal Normal finger to nose testing          Assessment & Plan:    Chronic vertigo, probably secondary to underlying chronic cerebrovascular disease.  Patient has had an MRI that revealed microvascular changes.  Very prominent in the pons.  Symptoms have been chronic and patient is a high fall risk.  We'll set up for vestibular rehabilitation Essential hypertension, stable  Nyoka Cowden, MD

## 2016-07-07 ENCOUNTER — Other Ambulatory Visit: Payer: Self-pay | Admitting: Internal Medicine

## 2016-07-07 DIAGNOSIS — Z1231 Encounter for screening mammogram for malignant neoplasm of breast: Secondary | ICD-10-CM

## 2016-07-10 ENCOUNTER — Ambulatory Visit
Admission: RE | Admit: 2016-07-10 | Discharge: 2016-07-10 | Disposition: A | Discharge status: Home / Self Care | Payer: Commercial Managed Care - HMO | Source: Ambulatory Visit | Attending: Internal Medicine | Admitting: Internal Medicine

## 2016-07-10 DIAGNOSIS — Z1231 Encounter for screening mammogram for malignant neoplasm of breast: Secondary | ICD-10-CM

## 2016-08-01 ENCOUNTER — Other Ambulatory Visit: Payer: Self-pay | Admitting: Internal Medicine

## 2016-08-07 ENCOUNTER — Ambulatory Visit (INDEPENDENT_AMBULATORY_CARE_PROVIDER_SITE_OTHER): Payer: Commercial Managed Care - HMO | Admitting: Internal Medicine

## 2016-08-07 ENCOUNTER — Encounter: Payer: Self-pay | Admitting: Internal Medicine

## 2016-08-07 VITALS — BP 132/80 | HR 57 | Temp 98.0°F | Resp 20 | Ht 64.0 in | Wt 189.0 lb

## 2016-08-07 DIAGNOSIS — L309 Dermatitis, unspecified: Secondary | ICD-10-CM | POA: Diagnosis not present

## 2016-08-07 DIAGNOSIS — F329 Major depressive disorder, single episode, unspecified: Secondary | ICD-10-CM

## 2016-08-07 DIAGNOSIS — I10 Essential (primary) hypertension: Secondary | ICD-10-CM | POA: Diagnosis not present

## 2016-08-07 DIAGNOSIS — R42 Dizziness and giddiness: Secondary | ICD-10-CM | POA: Diagnosis not present

## 2016-08-07 DIAGNOSIS — R5383 Other fatigue: Secondary | ICD-10-CM

## 2016-08-07 DIAGNOSIS — Z23 Encounter for immunization: Secondary | ICD-10-CM

## 2016-08-07 DIAGNOSIS — F32A Depression, unspecified: Secondary | ICD-10-CM

## 2016-08-07 LAB — CBC WITH DIFFERENTIAL/PLATELET
Basophils Absolute: 0 10*3/uL (ref 0.0–0.1)
Basophils Relative: 0.5 % (ref 0.0–3.0)
Eosinophils Absolute: 0.1 10*3/uL (ref 0.0–0.7)
Eosinophils Relative: 2 % (ref 0.0–5.0)
HCT: 38.7 % (ref 36.0–46.0)
Hemoglobin: 12.7 g/dL (ref 12.0–15.0)
Lymphocytes Relative: 31 % (ref 12.0–46.0)
Lymphs Abs: 2.2 10*3/uL (ref 0.7–4.0)
MCHC: 32.9 g/dL (ref 30.0–36.0)
MCV: 82.1 fl (ref 78.0–100.0)
Monocytes Absolute: 0.4 10*3/uL (ref 0.1–1.0)
Monocytes Relative: 5.3 % (ref 3.0–12.0)
Neutro Abs: 4.3 10*3/uL (ref 1.4–7.7)
Neutrophils Relative %: 61.2 % (ref 43.0–77.0)
Platelets: 214 10*3/uL (ref 150.0–400.0)
RBC: 4.72 Mil/uL (ref 3.87–5.11)
RDW: 13.3 % (ref 11.5–15.5)
WBC: 7 10*3/uL (ref 4.0–10.5)

## 2016-08-07 LAB — TSH: TSH: 3.66 u[IU]/mL (ref 0.35–4.50)

## 2016-08-07 LAB — COMPREHENSIVE METABOLIC PANEL
ALT: 6 U/L (ref 0–35)
AST: 8 U/L (ref 0–37)
Albumin: 3.9 g/dL (ref 3.5–5.2)
Alkaline Phosphatase: 89 U/L (ref 39–117)
BUN: 18 mg/dL (ref 6–23)
CO2: 29 mEq/L (ref 19–32)
Calcium: 8.8 mg/dL (ref 8.4–10.5)
Chloride: 105 mEq/L (ref 96–112)
Creatinine, Ser: 0.88 mg/dL (ref 0.40–1.20)
GFR: 67.72 mL/min (ref >60.00)
Glucose, Bld: 82 mg/dL (ref 70–99)
Potassium: 3.5 mEq/L (ref 3.5–5.1)
Sodium: 140 mEq/L (ref 135–145)
Total Bilirubin: 0.4 mg/dL (ref 0.2–1.2)
Total Protein: 6.6 g/dL (ref 6.0–8.3)

## 2016-08-07 LAB — SEDIMENTATION RATE: Sed Rate: 16 mm/hr (ref 0–30)

## 2016-08-07 NOTE — Progress Notes (Signed)
Subjective:    Patient ID: Erica Morris, female    DOB: 1946-10-06, 70 y.o.   MRN: [id number]  HPI  70 year old patient who has essential hypertension.  She has history of cerebrovascular disease and chronic vertigo.  No complaints of vertigo today Complaints the include the fatigue.  She states this has been present for 3-4 months.  She has a history depression.  She now is living with a son and daughter. She has a six-month history of a dermatitis involving the posterior lower legs bilaterally.  No new drugs.  This has been refractory to moderate strength topical steroids.  There has been some associated pruritus  Past Medical History:  Diagnosis Date  . ABDOMINAL PAIN, RECURRENT 03/29/2008  . ANXIETY 10/07/2007  . Candidiasis of mouth 05/30/2010  . GERD 07/25/2007  . Hernia, hiatal   . HYPERTENSION 07/25/2007  . Stroke [facility])      Social History   Social History  . Marital status: Widowed    Spouse name: N/A  . Number of children: N/A  . Years of education: N/A   Occupational History  . Not on file.   Social History Main Topics  . Smoking status: Never Smoker  . Smokeless tobacco: Never Used  . Alcohol use Yes     Comment: occasional  . Drug use: No  . Sexual activity: No   Other Topics Concern  . Not on file   Social History Narrative  . No narrative on file    Past Surgical History:  Procedure Laterality Date  . ABDOMINAL HYSTERECTOMY    . BREAST SURGERY     bx  . KNEE ARTHROSCOPY      Family History  Problem Relation Age of Onset  . Kidney disease Neg Hx   . Stroke Neg Hx   . Diabetes Neg Hx     Allergies  Allergen Reactions  . Lisinopril Cough  . Codeine Phosphate Itching  . Morphine And Related Itching    Burning sensation and itchiness with IV morphine    Current Outpatient Prescriptions on File Prior to Visit  Medication Sig Dispense Refill  . amLODipine (NORVASC) 5 MG tablet Take 1 tablet (5 mg total) by mouth daily. TAKE 1 TABLET(2.5  MG) BY MOUTH DAILY 30 tablet 5  . aspirin EC 81 MG tablet Take 81 mg by mouth every morning.     Marland Kitchen FLUoxetine (PROZAC) 40 MG capsule TAKE ONE CAPSULE BY MOUTH EVERY DAY 90 capsule 1  . levothyroxine (SYNTHROID, LEVOTHROID) 25 MCG tablet Take 1 tablet (25 mcg total) by mouth daily. 90 tablet 3  . meclizine (ANTIVERT) 25 MG tablet Take 1 tablet (25 mg total) by mouth 3 (three) times daily as needed for dizziness. 30 tablet 5  . pantoprazole (PROTONIX) 40 MG tablet TAKE 1 TABLET(40 MG) BY MOUTH DAILY 30 tablet 5  . RaNITidine HCl (ACID REDUCER PO) Take 1 tablet by mouth daily.     No current facility-administered medications on file prior to visit.     BP 132/80 (BP Location: Left Arm, Patient Position: Sitting, Cuff Size: Normal)   Pulse (!) 57   Temp 98 F (36.7 C) (Oral)   Resp 20   Ht 5\' 4"  (1.626 m)   Wt 189 lb (85.7 kg)   SpO2 98%   BMI 32.44 kg/m     Review of Systems  Constitutional: Positive for activity change and fatigue.  Skin: Positive for rash.  Neurological: Positive for weakness.  Psychiatric/Behavioral: Positive for  dysphoric mood and sleep disturbance. The patient is nervous/anxious.        Objective:   Physical Exam  Constitutional: She is oriented to person, place, and time. She appears well-developed and well-nourished.  HENT:  Head: Normocephalic.  Right Ear: External ear normal.  Left Ear: External ear normal.  Mouth/Throat: Oropharynx is clear and moist.  Eyes: Conjunctivae and EOM are normal. Pupils are equal, round, and reactive to light.  Neck: Normal range of motion. Neck supple. No thyromegaly present.  Cardiovascular: Normal rate, regular rhythm, normal heart sounds and intact distal pulses.   Pulmonary/Chest: Effort normal and breath sounds normal.  Abdominal: Soft. Bowel sounds are normal. She exhibits no mass. There is no tenderness.  Musculoskeletal: Normal range of motion.  Lymphadenopathy:    She has no cervical adenopathy.    Neurological: She is alert and oriented to person, place, and time.  Skin: Skin is warm and dry. No rash noted.  Erythematous dry, thickened, slightly scaly dermatitis involving the posterior aspects of both lower legs from the midcalf to the ankle bilaterally  Psychiatric: She has a normal mood and affect. Her behavior is normal.          Assessment & Plan:    fatigue/, weakness, probable depression.  Behavior health follow-up.  Encouraged.  She is agreeable.  She will make an appointment to see her daughters behavioral health specialist  chronic dermatitis.  Will set up to see dermatology .  Hypothyroidism.  We'll check TSH  , chronic fatigue.  Will set up for screening lab   behavioral health consult  dermatology consult  .  Recheck here in 3 months or as needed  [employer]

## 2016-08-07 NOTE — Patient Instructions (Addendum)
It is important that you exercise regularly, at least 20 minutes 3 to 4 times per week.  If you develop chest pain or shortness of breath seek  medical attention.  Dermatology follow-up as scheduled  Discontinue ranitidine  Return in 3 months for follow-up  Call or return to clinic prn if these symptoms worsen or fail to improve as anticipated.   behavioral health referral as discussed

## 2016-08-11 ENCOUNTER — Telehealth: Payer: Self-pay | Admitting: Internal Medicine

## 2016-08-11 NOTE — Telephone Encounter (Signed)
Pt would like blood work results °

## 2016-08-12 NOTE — Telephone Encounter (Signed)
Please call/notify patient that lab/test/procedure is normal 

## 2016-08-12 NOTE — Telephone Encounter (Signed)
Pt calling for lab results, please advise. 

## 2016-08-12 NOTE — Telephone Encounter (Signed)
Pt calling for lab results.

## 2016-08-13 NOTE — Telephone Encounter (Signed)
Spoke to pt, told her lab results were normal per Dr. K. Pt verbalized understanding. 

## 2016-09-24 ENCOUNTER — Ambulatory Visit: Payer: Commercial Managed Care - HMO | Admitting: Family Medicine

## 2016-10-07 ENCOUNTER — Ambulatory Visit: Payer: Commercial Managed Care - HMO | Admitting: Family Medicine

## 2016-10-29 ENCOUNTER — Encounter (HOSPITAL_COMMUNITY): Payer: Self-pay | Admitting: Emergency Medicine

## 2016-10-29 ENCOUNTER — Ambulatory Visit (HOSPITAL_COMMUNITY)
Admission: EM | Admit: 2016-10-29 | Discharge: 2016-10-29 | Disposition: A | Discharge status: Home / Self Care | Payer: Commercial Managed Care - HMO | Attending: Family Medicine | Admitting: Family Medicine

## 2016-10-29 DIAGNOSIS — R600 Localized edema: Secondary | ICD-10-CM

## 2016-10-29 DIAGNOSIS — B354 Tinea corporis: Secondary | ICD-10-CM | POA: Diagnosis not present

## 2016-10-29 MED ORDER — FUROSEMIDE 20 MG PO TABS: 20.0000 mg | ORAL_TABLET | Freq: Every day | ORAL | 0 refills | Status: DC

## 2016-10-29 MED ORDER — KETOCONAZOLE 2 % EX CREA: 1.0000 "application " | TOPICAL_CREAM | Freq: Every day | CUTANEOUS | 0 refills | Status: DC

## 2016-10-29 NOTE — ED Provider Notes (Signed)
CSN: [id number]     Arrival date & time 10/29/16  1457 History   First MD Initiated Contact with Patient 10/29/16 1543     Chief Complaint  Patient presents with  . Leg Swelling   (Consider location/radiation/quality/duration/timing/severity/associated sxs/prior Treatment) HPI  This is a 70 y/o female with a PMHx stroke, anxiety, HTN presenting for LE swelling, blistering, and SOB. She is accompanied by her daughter-in-law who provides most of the history.  She had blisters come up several months ago on the back of her calves bilaterally. Her daughter-in-law notes that she applied ketoconazole cream to her legs for proximally 2 weeks which improved her symptoms, but did not resolve them. The rash became more promienent a few weeks ago and progressively worsened. The rash is erythematous, thick, and leathery. She notes blisters that pop with a purulent yellow material. She is now very bothered by the pain and called her new PCP (she has not established with the clinic just yet) who advised her to come to urgent care. The area burns, itches, and stings. Anything that touches the area makes it worse. Water makes the rash worse  No one else has the rash. No fevers or chills.   She's also had some SOB for 2-3 days. She feels like she's "closed up." She does know that she has anxiety. Lying supine makes it worse. She sleeps with 3 pillows. She has PND.   Last night they noted fluid retention in her feet and hands. She's never had an issue with swelling in the past. She notes when she lays down, she starts having a panic attack and her heart beats strongly and loudly. She intermittently has a cough that is productive of a yellow phlegm that is more prominent at night. She denies rhinorrhea, nasal congestion, or facial pain, chest pain.  She has never had an echo in our system.   Past Medical History:  Diagnosis Date  . ABDOMINAL PAIN, RECURRENT 03/29/2008  . ANXIETY 10/07/2007  . Candidiasis of mouth  05/30/2010  . GERD 07/25/2007  . Hernia, hiatal   . HYPERTENSION 07/25/2007  . Stroke [facility])    Past Surgical History:  Procedure Laterality Date  . ABDOMINAL HYSTERECTOMY    . BREAST SURGERY     bx  . KNEE ARTHROSCOPY     Family History  Problem Relation Age of Onset  . Kidney disease Neg Hx   . Stroke Neg Hx   . Diabetes Neg Hx    Social History  Substance Use Topics  . Smoking status: Never Smoker  . Smokeless tobacco: Never Used  . Alcohol use Yes     Comment: occasional   OB History    No data available     Review of Systems  Constitutional: Negative for activity change, appetite change, chills, diaphoresis, fatigue and fever.  HENT: Positive for rhinorrhea. Negative for congestion, ear pain, facial swelling, postnasal drip, sinus pain, sinus pressure, sneezing, sore throat, trouble swallowing and voice change.   Eyes: Negative for photophobia, pain and redness.  Respiratory: Positive for cough and shortness of breath. Negative for chest tightness, wheezing and stridor.   Cardiovascular: Positive for palpitations and leg swelling. Negative for chest pain.  Gastrointestinal: Negative for abdominal distention, abdominal pain, diarrhea, nausea and vomiting.  Endocrine: Negative.   Genitourinary: Negative.   Musculoskeletal: Negative for arthralgias and joint swelling.  Skin: Positive for rash.  Allergic/Immunologic: Negative.   Neurological: Positive for dizziness. Negative for facial asymmetry, weakness, light-headedness, numbness and headaches.  Hematological: Negative.   Psychiatric/Behavioral: The patient is nervous/anxious.     Allergies  Lisinopril; Codeine phosphate; and Morphine and related  Home Medications   Prior to Admission medications   Medication Sig Start Date End Date Taking? Authorizing Provider  amLODipine (NORVASC) 5 MG tablet Take 1 tablet (5 mg total) by mouth daily. TAKE 1 TABLET(2.5 MG) BY MOUTH DAILY 06/05/16  Yes Marletta Lor, MD   aspirin EC 81 MG tablet Take 81 mg by mouth every morning.    Yes Historical Provider, MD  FLUoxetine (PROZAC) 40 MG capsule TAKE ONE CAPSULE BY MOUTH EVERY DAY 08/01/16  Yes Marletta Lor, MD  levothyroxine (SYNTHROID, LEVOTHROID) 25 MCG tablet Take 1 tablet (25 mcg total) by mouth daily. 09/10/15  Yes Marletta Lor, MD  meclizine (ANTIVERT) 25 MG tablet Take 1 tablet (25 mg total) by mouth 3 (three) times daily as needed for dizziness. 06/05/16  Yes Marletta Lor, MD  pantoprazole (PROTONIX) 40 MG tablet TAKE 1 TABLET(40 MG) BY MOUTH DAILY 06/05/16  Yes Marletta Lor, MD  furosemide (LASIX) 20 MG tablet Take 1 tablet (20 mg total) by mouth daily. 10/29/16   Archie Patten, MD  ketoconazole (NIZORAL) 2 % cream Apply 1 application topically daily. 10/29/16   Archie Patten, MD   Meds Ordered and Administered this Visit  Medications - No data to display  BP 133/60 (BP Location: Left Arm)   Pulse 66   Temp 98.4 F (36.9 C) (Oral)   Resp 16   SpO2 100%  No data found.   Physical Exam  Constitutional: She appears well-developed and well-nourished. No distress.  HENT:  Head: Normocephalic.  Right Ear: External ear normal.  Left Ear: External ear normal.  Nose: Nose normal.  Mouth/Throat: Oropharynx is clear and moist.  Adentulous.   Eyes: Conjunctivae are normal. Right eye exhibits no discharge. Left eye exhibits no discharge. No scleral icterus.  Neck: Normal range of motion.  Cardiovascular: Normal rate, regular rhythm and normal heart sounds.  Exam reveals no friction rub.   No murmur heard. No JVD.   Pulmonary/Chest: Effort normal and breath sounds normal. No respiratory distress. She has no wheezes. She has no rales. She exhibits no tenderness.  Musculoskeletal: She exhibits edema and tenderness.  2+ pitting edema in the left foot, trace/1+ pitting edema in the right foot.  Lymphadenopathy:    She has no cervical adenopathy.  Neurological: She is alert.  She exhibits normal muscle tone.  Skin: Skin is warm. Capillary refill takes less than 2 seconds. She is not diaphoretic.  Well demarcated, thickened erythematous rash over the posterior aspect of the calves bilaterally with what appears to be unroofed pustules vs papules. No drainage noted.     Urgent Care Course   Clinical Course     Procedures (including critical care time)  Labs Review Labs Reviewed - No data to display  Imaging Review No results found.     MDM   1. Tinea corporis   2. Leg edema    This is a 70 year old female presenting with a rash and lower extremity edema. She endorses shortness of breath, however she is satting 97-100% on room air during her examination and her lungs are clear. Her rash is most consistent with a tinea corporis, she was prescribed ketoconazole cream to use for 1 Month. We discussed return precautions such as worsening rash, or systemic symptoms.  For the lower external edema, suspect this may be secondary to  the fungal infection vs amlodipine, however she may be slightly volume overloaded as she endorses nocturia 4-5 times per night. She was prescribed Lasix 20 mg daily and advised to follow-up with her new PCP at her scheduled appointment on Thursday (one week from now). She does not appear acutely volume overloaded as her lungs are clear and there is no JVD. She was also advised to check her weights daily and keep a log. Strict return precautions were discussed with the patient and her daughter-in-law.    Archie Patten, MD 10/29/16 [phone]

## 2016-10-29 NOTE — Discharge Instructions (Signed)
Your exam is consistent with a fungal infection.  I have prescribed you a cream to put on your legs daily. Take lasix once daily in the morning to try to get the fluid off. Start keeping a log of your weight.  Follow up with your new PcP in 1 week to determine if you need to continue this medication.

## 2016-10-29 NOTE — ED Triage Notes (Signed)
The patient presented to the [facility] with her family with a complaint of bilateral lower leg edema that started yesterday and neuropathy that started 2 weeks ago. The patient also has a rash on bother lower calves. The patient's PCP advised them to come to the [facility].

## 2016-11-05 ENCOUNTER — Encounter: Payer: Self-pay | Admitting: Family Medicine

## 2016-11-05 ENCOUNTER — Ambulatory Visit (INDEPENDENT_AMBULATORY_CARE_PROVIDER_SITE_OTHER): Payer: Commercial Managed Care - HMO | Admitting: Family Medicine

## 2016-11-05 VITALS — BP 122/70 | HR 59 | Temp 97.6°F | Ht 64.0 in | Wt 185.8 lb

## 2016-11-05 DIAGNOSIS — I872 Venous insufficiency (chronic) (peripheral): Secondary | ICD-10-CM | POA: Diagnosis not present

## 2016-11-05 DIAGNOSIS — G609 Hereditary and idiopathic neuropathy, unspecified: Secondary | ICD-10-CM

## 2016-11-05 DIAGNOSIS — I10 Essential (primary) hypertension: Secondary | ICD-10-CM | POA: Diagnosis not present

## 2016-11-05 DIAGNOSIS — Z1159 Encounter for screening for other viral diseases: Secondary | ICD-10-CM

## 2016-11-05 DIAGNOSIS — E7800 Pure hypercholesterolemia, unspecified: Secondary | ICD-10-CM

## 2016-11-05 DIAGNOSIS — G629 Polyneuropathy, unspecified: Secondary | ICD-10-CM | POA: Insufficient documentation

## 2016-11-05 DIAGNOSIS — Z23 Encounter for immunization: Secondary | ICD-10-CM | POA: Diagnosis not present

## 2016-11-05 DIAGNOSIS — R42 Dizziness and giddiness: Secondary | ICD-10-CM

## 2016-11-05 DIAGNOSIS — I679 Cerebrovascular disease, unspecified: Secondary | ICD-10-CM

## 2016-11-05 LAB — LIPID PANEL
Cholesterol: 252 mg/dL — ABNORMAL HIGH (ref <200)
HDL: 38 mg/dL — ABNORMAL LOW (ref >50)
LDL Cholesterol: 176 mg/dL — ABNORMAL HIGH (ref <100)
Total CHOL/HDL Ratio: 6.6 Ratio — ABNORMAL HIGH (ref <5.0)
Triglycerides: 192 mg/dL — ABNORMAL HIGH (ref <150)
VLDL: 38 mg/dL — ABNORMAL HIGH (ref <30)

## 2016-11-05 MED ORDER — GABAPENTIN 100 MG PO CAPS: 100.0000 mg | ORAL_CAPSULE | Freq: Three times a day (TID) | ORAL | 3 refills | Status: DC

## 2016-11-05 NOTE — Patient Instructions (Addendum)
There is no simple, easy fix for this. For your legs, I believe you actually have three problems 1. Damage to the veins causing the swelling. 2. Neuropathy, which is damage to the nerves causing the pain. 3. Arthritis of the left knee.  I will check your cholesterol today and call with results.  Because you have had a stroke, you likely need to be on a cholesterol medication.  Here is what I want to do today. 1. Stop the clonazepam, it makes you more likely to fall 2. I have ordered physical therapy to help with the falling and dizziness 3. I have sent in a new prescription for gabapentin to help the neuropathy and leg pain.   Stay on all the other same medications.   See me in 3-4 weeks and we will keep making small changes until you feel better.

## 2016-11-05 NOTE — Progress Notes (Signed)
   Subjective:    Patient ID: Erica Morris, female    DOB: 1946/07/06, 70 y.o.   MRN: [id number]  HPI New patient visit.   Fortunately, she has been a [facility] patient so her chart is nicely populated.  On med rec, the one med she was taking not recorded was clonazepam 1 mg bid, which I stopped.  Her main complaints have to do with legs, which I believe are parsed into three separate problems. 1. Leg swelling, chronic, longstanding.  Never previously told that she had heart, liver or kidney problems.  Does have recent labs (CMP, CBC), which I reviewed 2. Bilateral lower leg burning and pain.  Some mild numbness.  Describes more dysasthesia. 3. Specifically, left knee pain.  Previous arthroscopic knee surgery on left.    Other issues.  Has had a recent CVA, on ASA, not on statin.  No recent lipid panel.    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  Patient has not been to PT/neurovestibular rehab.  Hypertension.  Asymptomatic on current meds.       Review of Systems  No chest pain,  SOB..  No bleeding, bowel, bladder or appetite changes.     Objective:   Physical Exam  Lungs clear Cardiac RRR without m or g Abd benign Ext 2+ symmetric edema.  DP and PT pulses intact.  Skin has hyperestesea. And signs of excoriation ("I can't stop scratching)  Left knee shows no effusion.  Boney prominence and joint line pain. Eyes no diplopia. Neuro.  Leg strength normal and gait a little wide based but grossly normal        Assessment & Plan:

## 2016-11-06 ENCOUNTER — Ambulatory Visit: Payer: Commercial Managed Care - HMO | Admitting: Internal Medicine

## 2016-11-06 DIAGNOSIS — E7800 Pure hypercholesterolemia, unspecified: Secondary | ICD-10-CM | POA: Insufficient documentation

## 2016-11-06 LAB — HEPATITIS C ANTIBODY: HCV Ab: NEGATIVE

## 2016-11-06 MED ORDER — ATORVASTATIN CALCIUM 40 MG PO TABS: 40.0000 mg | ORAL_TABLET | Freq: Every day | ORAL | 3 refills | Status: DC

## 2016-11-06 NOTE — Assessment & Plan Note (Signed)
Add statin. 

## 2016-11-06 NOTE — Assessment & Plan Note (Signed)
Her gait instability is likely multifactoreal.  Will refer to neurovestibular rehab.

## 2016-11-06 NOTE — Assessment & Plan Note (Signed)
Likely cause of swelling.

## 2016-11-06 NOTE — Assessment & Plan Note (Signed)
Not on statin.  Will screen.  Cont ASA.

## 2016-11-06 NOTE — Assessment & Plan Note (Signed)
Most likely cause of her burning discomfort.  Push dose of gabapentin.

## 2016-11-07 ENCOUNTER — Other Ambulatory Visit: Payer: Self-pay

## 2016-11-07 ENCOUNTER — Encounter (HOSPITAL_COMMUNITY): Payer: Self-pay

## 2016-11-07 ENCOUNTER — Emergency Department (HOSPITAL_COMMUNITY): Payer: Commercial Managed Care - HMO

## 2016-11-07 ENCOUNTER — Observation Stay (HOSPITAL_COMMUNITY)
Admission: EM | Admit: 2016-11-07 | Discharge: 2016-11-09 | Disposition: A | Discharge status: Home / Self Care | Payer: Commercial Managed Care - HMO | Attending: Family Medicine | Admitting: Family Medicine

## 2016-11-07 DIAGNOSIS — E7800 Pure hypercholesterolemia, unspecified: Secondary | ICD-10-CM | POA: Diagnosis not present

## 2016-11-07 DIAGNOSIS — F419 Anxiety disorder, unspecified: Secondary | ICD-10-CM | POA: Insufficient documentation

## 2016-11-07 DIAGNOSIS — Z7982 Long term (current) use of aspirin: Secondary | ICD-10-CM | POA: Insufficient documentation

## 2016-11-07 DIAGNOSIS — K219 Gastro-esophageal reflux disease without esophagitis: Secondary | ICD-10-CM | POA: Diagnosis not present

## 2016-11-07 DIAGNOSIS — I10 Essential (primary) hypertension: Secondary | ICD-10-CM | POA: Diagnosis not present

## 2016-11-07 DIAGNOSIS — W19XXXA Unspecified fall, initial encounter: Secondary | ICD-10-CM | POA: Insufficient documentation

## 2016-11-07 DIAGNOSIS — F329 Major depressive disorder, single episode, unspecified: Secondary | ICD-10-CM | POA: Diagnosis not present

## 2016-11-07 DIAGNOSIS — S0083XA Contusion of other part of head, initial encounter: Secondary | ICD-10-CM | POA: Diagnosis not present

## 2016-11-07 DIAGNOSIS — Z79899 Other long term (current) drug therapy: Secondary | ICD-10-CM | POA: Diagnosis not present

## 2016-11-07 DIAGNOSIS — R21 Rash and other nonspecific skin eruption: Secondary | ICD-10-CM | POA: Diagnosis not present

## 2016-11-07 DIAGNOSIS — R42 Dizziness and giddiness: Secondary | ICD-10-CM | POA: Insufficient documentation

## 2016-11-07 DIAGNOSIS — E039 Hypothyroidism, unspecified: Secondary | ICD-10-CM | POA: Insufficient documentation

## 2016-11-07 DIAGNOSIS — G629 Polyneuropathy, unspecified: Secondary | ICD-10-CM | POA: Insufficient documentation

## 2016-11-07 DIAGNOSIS — Z8673 Personal history of transient ischemic attack (TIA), and cerebral infarction without residual deficits: Secondary | ICD-10-CM | POA: Diagnosis not present

## 2016-11-07 DIAGNOSIS — E876 Hypokalemia: Secondary | ICD-10-CM | POA: Diagnosis not present

## 2016-11-07 DIAGNOSIS — R55 Syncope and collapse: Principal | ICD-10-CM | POA: Diagnosis present

## 2016-11-07 DIAGNOSIS — F411 Generalized anxiety disorder: Secondary | ICD-10-CM

## 2016-11-07 DIAGNOSIS — F339 Major depressive disorder, recurrent, unspecified: Secondary | ICD-10-CM

## 2016-11-07 DIAGNOSIS — H8110 Benign paroxysmal vertigo, unspecified ear: Secondary | ICD-10-CM

## 2016-11-07 LAB — BASIC METABOLIC PANEL
Anion gap: 9 (ref 5–15)
BUN: 9 mg/dL (ref 6–20)
CO2: 27 mmol/L (ref 22–32)
Calcium: 8.8 mg/dL — ABNORMAL LOW (ref 8.9–10.3)
Chloride: 103 mmol/L (ref 101–111)
Creatinine, Ser: 0.98 mg/dL (ref 0.44–1.00)
GFR calc Af Amer: >60 mL/min (ref >60)
GFR calc non Af Amer: 58 mL/min — ABNORMAL LOW (ref >60)
Glucose, Bld: 104 mg/dL — ABNORMAL HIGH (ref 65–99)
Potassium: 3 mmol/L — ABNORMAL LOW (ref 3.5–5.1)
Sodium: 139 mmol/L (ref 135–145)

## 2016-11-07 LAB — CBG MONITORING, ED: Glucose-Capillary: 108 mg/dL — ABNORMAL HIGH (ref 65–99)

## 2016-11-07 LAB — URINALYSIS, ROUTINE W REFLEX MICROSCOPIC
Bacteria, UA: NONE SEEN
Bilirubin Urine: NEGATIVE
Glucose, UA: NEGATIVE mg/dL
Hgb urine dipstick: NEGATIVE
Ketones, ur: NEGATIVE mg/dL
Nitrite: NEGATIVE
Protein, ur: NEGATIVE mg/dL
Specific Gravity, Urine: 1.004 — ABNORMAL LOW (ref 1.005–1.030)
Squamous Epithelial / LPF: NONE SEEN
pH: 6 (ref 5.0–8.0)

## 2016-11-07 LAB — CBC
HCT: 36.8 % (ref 36.0–46.0)
Hemoglobin: 12.2 g/dL (ref 12.0–15.0)
MCH: 27.1 pg (ref 26.0–34.0)
MCHC: 33.2 g/dL (ref 30.0–36.0)
MCV: 81.6 fL (ref 78.0–100.0)
Platelets: 225 10*3/uL (ref 150–400)
RBC: 4.51 MIL/uL (ref 3.87–5.11)
RDW: 13.6 % (ref 11.5–15.5)
WBC: 8.5 10*3/uL (ref 4.0–10.5)

## 2016-11-07 LAB — I-STAT TROPONIN, ED
Troponin i, poc: 0 ng/mL (ref 0.00–0.08)
Troponin i, poc: 0 ng/mL (ref 0.00–0.08)

## 2016-11-07 LAB — TROPONIN I: Troponin I: <0.03 ng/mL (ref <0.03)

## 2016-11-07 MED ORDER — FLUOXETINE HCL 20 MG PO CAPS: 40.0000 mg | ORAL_CAPSULE | Freq: Every day | ORAL | Status: DC

## 2016-11-07 MED ORDER — FLUOXETINE HCL 20 MG PO CAPS
60.0000 mg | ORAL_CAPSULE | Freq: Every day | ORAL | Status: DC
  Administered 2016-11-08 – 2016-11-09 (×2): 60 mg via ORAL
  Filled 2016-11-07 (×2): qty 3

## 2016-11-07 MED ORDER — ACETAMINOPHEN 325 MG PO TABS: 650.0000 mg | ORAL_TABLET | Freq: Four times a day (QID) | ORAL | Status: DC | PRN

## 2016-11-07 MED ORDER — KETOCONAZOLE 2 % EX CREA
1.0000 "application " | TOPICAL_CREAM | Freq: Every day | CUTANEOUS | Status: DC
  Administered 2016-11-08 – 2016-11-09 (×2): 1 via TOPICAL
  Filled 2016-11-07: qty 15

## 2016-11-07 MED ORDER — ASPIRIN EC 81 MG PO TBEC
81.0000 mg | DELAYED_RELEASE_TABLET | Freq: Every morning | ORAL | Status: DC
  Administered 2016-11-08 – 2016-11-09 (×2): 81 mg via ORAL
  Filled 2016-11-07 (×2): qty 1

## 2016-11-07 MED ORDER — POTASSIUM CHLORIDE CRYS ER 20 MEQ PO TBCR
40.0000 meq | EXTENDED_RELEASE_TABLET | Freq: Once | ORAL | Status: AC
  Administered 2016-11-07: 40 meq via ORAL
  Filled 2016-11-07: qty 2

## 2016-11-07 MED ORDER — FUROSEMIDE 20 MG PO TABS
20.0000 mg | ORAL_TABLET | Freq: Every day | ORAL | Status: DC
  Administered 2016-11-08 – 2016-11-09 (×2): 20 mg via ORAL
  Filled 2016-11-07 (×2): qty 1

## 2016-11-07 MED ORDER — AMLODIPINE BESYLATE 5 MG PO TABS
5.0000 mg | ORAL_TABLET | Freq: Every day | ORAL | Status: DC
  Administered 2016-11-08 – 2016-11-09 (×2): 5 mg via ORAL
  Filled 2016-11-07 (×2): qty 1

## 2016-11-07 MED ORDER — SODIUM CHLORIDE 0.9% FLUSH
3.0000 mL | Freq: Two times a day (BID) | INTRAVENOUS | Status: DC
  Administered 2016-11-07 – 2016-11-09 (×4): 3 mL via INTRAVENOUS

## 2016-11-07 MED ORDER — MECLIZINE HCL 25 MG PO TABS
25.0000 mg | ORAL_TABLET | Freq: Three times a day (TID) | ORAL | Status: DC | PRN
  Administered 2016-11-07 – 2016-11-08 (×2): 25 mg via ORAL
  Filled 2016-11-07 (×3): qty 1

## 2016-11-07 MED ORDER — ENOXAPARIN SODIUM 40 MG/0.4ML ~~LOC~~ SOLN
40.0000 mg | SUBCUTANEOUS | Status: DC
  Administered 2016-11-08 – 2016-11-09 (×2): 40 mg via SUBCUTANEOUS
  Filled 2016-11-07 (×2): qty 0.4

## 2016-11-07 MED ORDER — PANTOPRAZOLE SODIUM 40 MG PO TBEC
40.0000 mg | DELAYED_RELEASE_TABLET | Freq: Every day | ORAL | Status: DC
  Administered 2016-11-08 – 2016-11-09 (×2): 40 mg via ORAL
  Filled 2016-11-07 (×2): qty 1

## 2016-11-07 MED ORDER — ACETAMINOPHEN 500 MG PO TABS
1000.0000 mg | ORAL_TABLET | Freq: Once | ORAL | Status: AC
  Administered 2016-11-07: 1000 mg via ORAL
  Filled 2016-11-07: qty 2

## 2016-11-07 MED ORDER — ACETAMINOPHEN 650 MG RE SUPP: 650.0000 mg | Freq: Four times a day (QID) | RECTAL | Status: DC | PRN

## 2016-11-07 MED ORDER — LEVOTHYROXINE SODIUM 25 MCG PO TABS
25.0000 ug | ORAL_TABLET | Freq: Every day | ORAL | Status: DC
  Administered 2016-11-08 – 2016-11-09 (×2): 25 ug via ORAL
  Filled 2016-11-07 (×2): qty 1

## 2016-11-07 MED ORDER — SODIUM CHLORIDE 0.9 % IV SOLN
INTRAVENOUS | Status: AC
  Administered 2016-11-07: 22:00:00 via INTRAVENOUS

## 2016-11-07 MED ORDER — GABAPENTIN 100 MG PO CAPS
100.0000 mg | ORAL_CAPSULE | Freq: Three times a day (TID) | ORAL | Status: DC
  Administered 2016-11-07 – 2016-11-09 (×5): 100 mg via ORAL
  Filled 2016-11-07 (×5): qty 1

## 2016-11-07 MED ORDER — ATORVASTATIN CALCIUM 40 MG PO TABS
40.0000 mg | ORAL_TABLET | Freq: Every day | ORAL | Status: DC
  Administered 2016-11-08 – 2016-11-09 (×2): 40 mg via ORAL
  Filled 2016-11-07 (×2): qty 1

## 2016-11-07 NOTE — ED Triage Notes (Addendum)
Per EMS - pt bent over in home today, when she stood up, she had near syncopal episode. Bruise to right elbow, small abrasion to right upper cheek. Pt felt dizzy, hx vertigo. Passed [facility]. 12-lead unremarkable. Hx neuropathy, c/o nerve pain in bilateral lower extremities.

## 2016-11-07 NOTE — ED Notes (Signed)
Randall Hiss, Resident MD aware pt requesting pain meds for chronic leg pain, will place order shortly.

## 2016-11-07 NOTE — ED Provider Notes (Signed)
[facility] DEPT Provider Note   CSN: [id number] Arrival date & time: 11/07/16  1706     History   Chief Complaint Chief Complaint  Patient presents with  . Near Syncope    HPI Erica Morris is a 70 y.o. female.   Loss of Consciousness   This is a new problem. The current episode started 1 to 2 hours ago. The problem occurs rarely. The problem has been resolved. She lost consciousness for a period of 1 to 5 minutes. The problem is associated with standing up and normal activity. Pertinent negatives include abdominal pain, back pain, chest pain, congestion, diaphoresis, dizziness, fever, headaches, light-headedness, palpitations, seizures, slurred speech, vertigo, visual change and vomiting.    Past Medical History:  Diagnosis Date  . ABDOMINAL PAIN, RECURRENT 03/29/2008  . ANXIETY 10/07/2007  . Candidiasis of mouth 05/30/2010  . GERD 07/25/2007  . Hernia, hiatal   . HYPERTENSION 07/25/2007  . Stroke [facility])     Patient Active Problem List   Diagnosis Date Noted  . Syncope 11/07/2016  . Pure hypercholesterolemia 11/06/2016  . Chronic venous insufficiency 11/05/2016  . Peripheral neuropathy ([place]) 11/05/2016  . Need for hepatitis C screening test 11/05/2016  . Cerebrovascular disease 06/09/2016  . 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 Hypokalemia 02/20/2015  . History of colonic polyps 06/04/2014  . Depression 03/29/2014  . Chronic constipation 03/29/2014  . ABDOMINAL PAIN, RECURRENT 03/29/2008  . Anxiety state 10/07/2007  . Essential hypertension 07/25/2007  . GERD 07/25/2007    Past Surgical History:  Procedure Laterality Date  . ABDOMINAL HYSTERECTOMY    . BREAST SURGERY     bx  . KNEE ARTHROSCOPY      OB History    No data available       Home Medications    Prior to Admission medications   Medication Sig Start Date End Date Taking? Authorizing Provider  amLODipine (NORVASC) 5 MG tablet Take 1 tablet (5 mg total) by mouth daily. TAKE  1 TABLET(2.5 MG) BY MOUTH DAILY 06/05/16  Yes Marletta Lor, MD  aspirin EC 81 MG tablet Take 81 mg by mouth every morning.    Yes Historical Provider, MD  atorvastatin (LIPITOR) 40 MG tablet Take 1 tablet (40 mg total) by mouth daily. 11/06/16  Yes Zenia Resides, MD  FLUoxetine (PROZAC) 40 MG capsule TAKE ONE CAPSULE BY MOUTH EVERY DAY 08/01/16  Yes Marletta Lor, MD  furosemide (LASIX) 20 MG tablet Take 1 tablet (20 mg total) by mouth daily. 10/29/16  Yes Archie Patten, MD  gabapentin (NEURONTIN) 100 MG capsule Take 1 capsule (100 mg total) by mouth 3 (three) times daily. 11/05/16  Yes Zenia Resides, MD  ketoconazole (NIZORAL) 2 % cream Apply 1 application topically daily. 10/29/16  Yes Archie Patten, MD  levothyroxine (SYNTHROID, LEVOTHROID) 25 MCG tablet Take 1 tablet (25 mcg total) by mouth daily. 09/10/15  Yes Marletta Lor, MD  meclizine (ANTIVERT) 25 MG tablet Take 1 tablet (25 mg total) by mouth 3 (three) times daily as needed for dizziness. 06/05/16  Yes Marletta Lor, MD  pantoprazole (PROTONIX) 40 MG tablet TAKE 1 TABLET(40 MG) BY MOUTH DAILY 06/05/16  Yes Marletta Lor, MD    Family History Family History  Problem Relation Age of Onset  . Kidney disease Neg Hx   . Stroke Neg Hx   . Diabetes Neg Hx     Social History Social History  Substance Use Topics  .  Smoking status: Never Smoker  . Smokeless tobacco: Never Used  . Alcohol use Yes     Comment: occasional     Allergies   Lisinopril; Codeine phosphate; and Morphine and related   Review of Systems Review of Systems  Constitutional: Negative for chills, diaphoresis and fever.  HENT: Negative for congestion, ear pain and sore throat.   Eyes: Negative for pain and visual disturbance.  Respiratory: Negative for cough and shortness of breath.   Cardiovascular: Positive for syncope. Negative for chest pain and palpitations.  Gastrointestinal: Negative for abdominal pain and vomiting.    Genitourinary: Negative for dysuria and hematuria.  Musculoskeletal: Negative for arthralgias and back pain.  Skin: Negative for color change and rash.  Neurological: Positive for syncope. Negative for dizziness, vertigo, seizures, light-headedness and headaches.  All other systems reviewed and are negative.    Physical Exam Updated Vital Signs BP 141/76   Pulse 77   Temp 98.5 F (36.9 C) (Oral)   Resp 18   Ht 5\' 4"  (1.626 m)   Wt 84 kg Comment: scale c  SpO2 99%   BMI 31.77 kg/m   Physical Exam  Constitutional: She is oriented to person, place, and time. She appears well-developed and well-nourished. No distress.  HENT:  Head: Normocephalic.  Right Ear: Tympanic membrane normal.  Left Ear: Tympanic membrane normal.  Mild bruising about the left eye and with eye movements proptosis no pain no tenderness to palpation  Eyes: Conjunctivae and EOM are normal. Pupils are equal, round, and reactive to light.  Neck: Neck supple.  Cardiovascular: Normal rate and regular rhythm.   No murmur heard. Pulmonary/Chest: Effort normal and breath sounds normal. No tachypnea. No respiratory distress. She has no decreased breath sounds.  Abdominal: Soft. There is no tenderness. There is no rigidity, no rebound and no guarding.  Musculoskeletal: She exhibits no edema.  Neurological: She is alert and oriented to person, place, and time. She has normal strength. No cranial nerve deficit or sensory deficit. She displays a negative Romberg sign. GCS eye subscore is 4. GCS verbal subscore is 5. GCS motor subscore is 6.  No dysmetria, no dysdiadochokinesia  Skin: Skin is warm and dry.  Psychiatric: She has a normal mood and affect.  Nursing note and vitals reviewed.    ED Treatments / Results  Labs (all labs ordered are listed, but only abnormal results are displayed) Labs Reviewed  BASIC METABOLIC PANEL - Abnormal; Notable for the following:       Result Value   Potassium 3.0 (*)     Glucose, Bld 104 (*)    Calcium 8.8 (*)    GFR calc non Af Amer 58 (*)    All other components within normal limits  URINALYSIS, ROUTINE W REFLEX MICROSCOPIC - Abnormal; Notable for the following:    Specific Gravity, Urine 1.004 (*)    Leukocytes, UA SMALL (*)    All other components within normal limits  CBG MONITORING, ED - Abnormal; Notable for the following:    Glucose-Capillary 108 (*)    All other components within normal limits  CBC  TROPONIN I  TROPONIN I  TROPONIN I  BASIC METABOLIC PANEL  CBC  I-STAT TROPOININ, ED  I-STAT TROPOININ, ED    EKG  EKG Interpretation  Date/Time:  Saturday November 07 2016 17:16:03 EST Ventricular Rate:  61 PR Interval:    QRS Duration: 96 QT Interval:  442 QTC Calculation: 446 R Axis:   47 Text Interpretation:  Normal  sinus rhythm Non-specific ST-t changes No significant change since last tracing Confirmed by RAY MD, Andee Poles [phone]) on 11/07/2016 5:40:36 PM       Radiology Ct Head Wo Contrast  Result Date: 11/07/2016 CLINICAL DATA:  Syncope with fall.  Facial injury on the right. EXAM: CT HEAD WITHOUT CONTRAST TECHNIQUE: Contiguous axial images were obtained from the base of the skull through the vertex without intravenous contrast. COMPARISON:  Head CT 04/27/2016 FINDINGS: Brain: No evidence of acute infarction, hemorrhage, hydrocephalus, extra-axial collection or mass lesion/mass effect. Stable chronic small vessel ischemia. Vascular: Atherosclerosis of skullbase vasculature without hyperdense vessel or abnormal calcification. Skull: No fracture or acute abnormality. Sinuses/Orbits: Paranasal sinuses and mastoid air cells are clear. The visualized orbits are unremarkable. No evidence of fracture of the included facial bones. Other: None. IMPRESSION: No acute intracranial abnormality.  No fracture. Electronically Signed   By: Jeb Levering M.D.   On: 11/07/2016 19:32    Procedures Procedures (including critical care  time)  Medications Ordered in ED Medications  atorvastatin (LIPITOR) tablet 40 mg (not administered)  gabapentin (NEURONTIN) capsule 100 mg (100 mg Oral Given 11/07/16 2202)  furosemide (LASIX) tablet 20 mg (not administered)  amLODipine (NORVASC) tablet 5 mg (not administered)  meclizine (ANTIVERT) tablet 25 mg (25 mg Oral Given 11/07/16 2202)  pantoprazole (PROTONIX) EC tablet 40 mg (not administered)  levothyroxine (SYNTHROID, LEVOTHROID) tablet 25 mcg (not administered)  enoxaparin (LOVENOX) injection 40 mg (not administered)  sodium chloride flush (NS) 0.9 % injection 3 mL (3 mLs Intravenous Given 11/07/16 2203)  acetaminophen (TYLENOL) tablet 650 mg (not administered)    Or  acetaminophen (TYLENOL) suppository 650 mg (not administered)  aspirin EC tablet 81 mg (not administered)  0.9 %  sodium chloride infusion ( Intravenous New Bag/Given 11/07/16 2203)  ketoconazole (NIZORAL) 2 % cream 1 application (not administered)  FLUoxetine (PROZAC) capsule 60 mg (not administered)  acetaminophen (TYLENOL) tablet 1,000 mg (1,000 mg Oral Given 11/07/16 1901)  potassium chloride SA (K-DUR,KLOR-CON) CR tablet 40 mEq (40 mEq Oral Given 11/07/16 2213)     Initial Impression / Assessment and Plan / ED Course  I have reviewed the triage vital signs and the nursing notes.  Pertinent labs & imaging results that were available during my care of the patient were reviewed by me and considered in my medical decision making (see chart for details).  Clinical Course    70 year old female had a syncopal episode wall helping her son assemble cabinets. Reported together she stood up and immediately collapsed. Did not feel it coming did not protect herself in her face and elbow on the ground. She has no bony tenderness about the elbow full range of motion and need for imaging at this time. She has some bruising on the face CT of the head is unremarkable for any acute intracranial abnormality. EKG shows sinus  rhythm with no skewed signs of ischemia interval abnormality or otherwise abnormalities. Laboratory analysis unremarkable for acute organ damage. No significant electrolyte derangements. Patient's story is concerning for unanticipated syncopal event, possible cardiac in origin. Patient will need admission for telemetry monitoring as well as echocardiogram and further workup. Vital signs stable time of admission. Further minute this patient's care please see inpatient team notes.  Final Clinical Impressions(s) / ED Diagnoses   Final diagnoses:  None    New Prescriptions Current Discharge Medication List       Dewaine Conger, MD 11/07/16 [id number]    Pattricia Boss, MD 11/07/16 2350

## 2016-11-07 NOTE — ED Notes (Signed)
Patient transported to CT 

## 2016-11-07 NOTE — ED Notes (Signed)
EDP at bedside  

## 2016-11-07 NOTE — H&P (Signed)
[facility] Admission History and Physical Service Pager: [phone]  Patient name: Erica Morris Medical record number: [id number] Date of birth: [birth year] Age: 70 y.o. Gender: female  Primary Care Provider: Zigmund Gottron, MD Consultants: PT/OT Code Status: full  Chief Complaint: syncope  Assessment and Plan: CANDEE HEIDORN is a 70 y.o. female presenting with syncope. PMH is significant for HTN, hypothyroidism, anxiety, depression, GERD, TIA, and hyperlipidemia  Syncope- Witnessed event at home by son. CT head negative for acute fracture or intracranial pathology. Differential includes orthostatic hypotension worsened by dehydration and poor oral intake over past 48 hours, cardiac arrhythmia, vertigo. Less likely ACS, initial troponin 0.00, ekg without ischemic changes in ED. No signs of infection, afebrile, WBC normal at 8.5. Urinalysis with small leukocytes. No dysuria. No evidence of seizure like activity. Also less likeley differential includes PE, but with normal vitals and no SOB or evidence of clinical DVT less likely. Wells score 0 -admit for observation on telemetry, attending Dr. Ree Kida -continuous cardiac monitoring -vitals per floor -up with assistance, fall precautions -gentle IVF, 75 cc/hr -orthostatic vitals -echocardiogram -trend troponin -am EKG -am BMP, CBC -PT/OT consult  Vertigo- Mild vertigo at this time, physiologic horizontal nystagmus noted, present on previous evalutaion in 04/2016. Likley contributing to he presentation - continue meclizine PRN  - PT/OT - falls precautions as above  HTN- BP wnl, takes norvasc 5 mg and lasix 20mg  at home -continue norvasc and lasix -monitor BP  Difficult Social Situation- Currently staying in a home with 7 people and shares a room with 5 other people and she has to sleep on the floor. - SW consult  Hypothyroidism- last TSH 3.66 in September 2017. Takes synthroid daily -continue  synthroid 51mcg daily  GERD- hx of hiatal hernia, takes protonix at home -protonix 40mg  daily  Hypokalemia- noted to be 3.0 on admission -kdur 40 mg x1 -am BMP  Anxiety/depression- takes prozac 40 mg daily. Given daily panic attacks, concern her prozac is insufficient. Concern her reduced PO intake is partly due to this. Weight overall stable at 185 -increase to prozac 60 mg daily from 40mg  - consider adding buspar if she become acutely anxious -patient would benefit from outpatient behavioral health consult - consider  -consider SW consult  Hx of TIA/CVD- daily aspirin at home -continue aspirin 81 mg  Hyperlipidemia- lipid panel 12/7 showed continued elevated total cholesterol to 252, LDL176 -continue home atorvastatin 40mg   Peripheral neuropathy- Stable -continue gabapentin  LE Rash - continue home ketokonazole  FEN/GI: heart healthy diet, NS 75cc/hr Prophylaxis: lovenox  Disposition: pending PT/OT, clinical improvement  History of Present Illness:  Erica Morris is a 70 y.o. female presenting with syncope. She is staying with her son. Earlier today they were trying to get the TV to work. She was bent over and started to stand up. She then felt "swimmy headed" and passed out. She recalls feeling short of breath and anxious at the time which to her felt like she was having a panic attack she which she has daily.  She fell into some boxes. Her son got her up and over to the couch. She was not confused afterward. No seizure like movements or incontinence. This has never happened before but she does endorse frequent falls due to left knee arthritis that causes knee to give out on her. She reports daily anxiety and panic attacks due to unstable living situation. Currently lives with son and his wife who is in poor health. There  are also 7 children in the house as well. She reports having decreased appetite and has not eaten or drank anything except a coke in the past 48 hours. She has  vertigo and has been feeling the room spin recently. She also endorses frequent palpitations that she notices especially at night. Denies chest pain, current SOB, recent illness, fevers, palpitations or headache. Reports frequent heartburn due to hiatal hernia.  She reports reduced PO intake over the last several days since she has not have anyone to cook for her. She reports last eating a meal 2 days ago, has had coke today, no water. She denies N/V/D  Review Of Systems: Per HPI with the following additions:   Review of Systems  Constitutional: Negative for chills and fever.  HENT: Negative for ear pain.   Eyes: Negative for blurred vision.  Respiratory: Positive for shortness of breath. Negative for stridor.   Cardiovascular: Positive for palpitations and leg swelling. Negative for chest pain and orthopnea.  Gastrointestinal: Positive for heartburn. Negative for nausea and vomiting.  Genitourinary: Positive for urgency. Negative for dysuria.  Musculoskeletal: Positive for falls.  Neurological: Positive for dizziness and loss of consciousness. Negative for seizures and weakness.  Psychiatric/Behavioral: Negative for substance abuse. The patient is nervous/anxious.     Patient Active Problem List   Diagnosis Date Noted  . Syncope 11/07/2016  . Pure hypercholesterolemia 11/06/2016  . Chronic venous insufficiency 11/05/2016  . Peripheral neuropathy ([place]) 11/05/2016  . Need for hepatitis C screening test 11/05/2016  . Cerebrovascular disease 06/09/2016  . 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 Hypokalemia 02/20/2015  . History of colonic polyps 06/04/2014  . Depression 03/29/2014  . Chronic constipation 03/29/2014  . ABDOMINAL PAIN, RECURRENT 03/29/2008  . Anxiety state 10/07/2007  . Essential hypertension 07/25/2007  . GERD 07/25/2007    Past Medical History: Past Medical History:  Diagnosis Date  . ABDOMINAL PAIN, RECURRENT 03/29/2008  . ANXIETY 10/07/2007  .  Candidiasis of mouth 05/30/2010  . GERD 07/25/2007  . Hernia, hiatal   . HYPERTENSION 07/25/2007  . Stroke [facility])     Past Surgical History: Past Surgical History:  Procedure Laterality Date  . ABDOMINAL HYSTERECTOMY    . BREAST SURGERY     bx  . KNEE ARTHROSCOPY      Social History: Social History  Substance Use Topics  . Smoking status: Never Smoker  . Smokeless tobacco: Never Used  . Alcohol use Yes     Comment: occasional   Additional social history: lives with son but frequently moves around, denies alcohol use Please also refer to relevant sections of EMR.  Family History: Family History  Problem Relation Age of Onset  . Kidney disease Neg Hx   . Stroke Neg Hx   . Diabetes Neg Hx    Allergies and Medications: Allergies  Allergen Reactions  . Lisinopril Cough  . Codeine Phosphate Itching  . Morphine And Related Itching    Burning sensation and itchiness with IV morphine   No current facility-administered medications on file prior to encounter.    Current Outpatient Prescriptions on File Prior to Encounter  Medication Sig Dispense Refill  . amLODipine (NORVASC) 5 MG tablet Take 1 tablet (5 mg total) by mouth daily. TAKE 1 TABLET(2.5 MG) BY MOUTH DAILY 30 tablet 5  . aspirin EC 81 MG tablet Take 81 mg by mouth every morning.     Marland Kitchen atorvastatin (LIPITOR) 40 MG tablet Take 1 tablet (40 mg total) by mouth daily.  90 tablet 3  . FLUoxetine (PROZAC) 40 MG capsule TAKE ONE CAPSULE BY MOUTH EVERY DAY 90 capsule 1  . furosemide (LASIX) 20 MG tablet Take 1 tablet (20 mg total) by mouth daily. 30 tablet 0  . gabapentin (NEURONTIN) 100 MG capsule Take 1 capsule (100 mg total) by mouth 3 (three) times daily. 90 capsule 3  . ketoconazole (NIZORAL) 2 % cream Apply 1 application topically daily. 30 g 0  . levothyroxine (SYNTHROID, LEVOTHROID) 25 MCG tablet Take 1 tablet (25 mcg total) by mouth daily. 90 tablet 3  . meclizine (ANTIVERT) 25 MG tablet Take 1 tablet (25 mg total) by  mouth 3 (three) times daily as needed for dizziness. 30 tablet 5  . pantoprazole (PROTONIX) 40 MG tablet TAKE 1 TABLET(40 MG) BY MOUTH DAILY 30 tablet 5    Objective: BP 141/76   Pulse 77   Temp 98.5 F (36.9 C) (Oral)   Resp 18   Ht 5\' 4"  (1.626 m)   Wt 185 lb 1.6 oz (84 kg) Comment: scale c  SpO2 99%   BMI 31.77 kg/m  Exam: General: Pleasant elderly lady in no acute distress Eyes: EOMI, PERRLA. Horizontal nystagmus noted ENTM: dry mucous membranes, adentulous  Neck: supple, non-tender, no lymphadenopathy Cardiovascular: RRR, no murmurs rubs or gallops Respiratory: CTA bilaterally no increased work of breathing Gastrointestinal: soft, non-tender. +BS MSK: moves all extremities equally Derm: warm, dry, mildly erythematous dry rash on back of lower legs bilaterally Psych: normal mood and affect  Cranial Nerves II - XII - II - Visual field intact  III, IV, VI - Extraocular movements intact. V - Facial sensation intact bilaterally. VII - Facial movement intact bilaterally. VIII - Hearing & vestibular intact bilaterally. X - Palate elevates symmetrically, no dysarthria. XI - Chin turning & shoulder shrug intact bilaterally. XII - Tongue protrusion intact.  Motor Strength - The patient's strength was 5/5 in all extremities and pronator drift was absent. Bulk was normal and fasciculations were absent.  Motor Tone - Muscle tone was assessed at the neck and appendages and was normal.  Cerebellar: No ataxia on finger-nose-finger bilaterally  Sensory - Light touch, temperature/pinprick were assessed and were symmetrical.   Coordination - The patient had normal movements in the hands with no ataxia or dysmetria. Tremor was absent.  Gait and Station - normal gait   Labs and Imaging: CBC BMET   Recent Labs Lab 11/07/16 1720  WBC 8.5  HGB 12.2  HCT 36.8  PLT 225    Recent Labs Lab 11/07/16 1720  NA 139  K 3.0*  CL 103  CO2 27  BUN 9  CREATININE 0.98   GLUCOSE 104*  CALCIUM 8.8*     Ct Head Wo Contrast  Result Date: 11/07/2016 CLINICAL DATA:  Syncope with fall.  Facial injury on the right. EXAM: CT HEAD WITHOUT CONTRAST TECHNIQUE: Contiguous axial images were obtained from the base of the skull through the vertex without intravenous contrast. COMPARISON:  Head CT 04/27/2016 FINDINGS: Brain: No evidence of acute infarction, hemorrhage, hydrocephalus, extra-axial collection or mass lesion/mass effect. Stable chronic small vessel ischemia. Vascular: Atherosclerosis of skullbase vasculature without hyperdense vessel or abnormal calcification. Skull: No fracture or acute abnormality. Sinuses/Orbits: Paranasal sinuses and mastoid air cells are clear. The visualized orbits are unremarkable. No evidence of fracture of the included facial bones. Other: None. IMPRESSION: No acute intracranial abnormality.  No fracture. Electronically Signed   By: Jeb Levering M.D.   On: 11/07/2016 19:32  Steve Rattler, DO 11/07/2016, 9:51 PM PGY-1, [place] Intern pager: [phone], text pages welcome  I have seen and examined the patient. I have read and agree with the above note. My changes are noted in blue.  Alyssa A. Lincoln Brigham MD, [place] [facility] Resident PGY-2 Pager [phone]

## 2016-11-07 NOTE — ED Notes (Signed)
Pt able to stand to use bedside commode for toileting.

## 2016-11-08 ENCOUNTER — Observation Stay (HOSPITAL_COMMUNITY): Payer: Commercial Managed Care - HMO

## 2016-11-08 DIAGNOSIS — I10 Essential (primary) hypertension: Secondary | ICD-10-CM

## 2016-11-08 DIAGNOSIS — E039 Hypothyroidism, unspecified: Secondary | ICD-10-CM | POA: Diagnosis not present

## 2016-11-08 DIAGNOSIS — F339 Major depressive disorder, recurrent, unspecified: Secondary | ICD-10-CM | POA: Diagnosis not present

## 2016-11-08 DIAGNOSIS — R55 Syncope and collapse: Secondary | ICD-10-CM | POA: Diagnosis not present

## 2016-11-08 DIAGNOSIS — S0083XA Contusion of other part of head, initial encounter: Secondary | ICD-10-CM | POA: Diagnosis not present

## 2016-11-08 DIAGNOSIS — F411 Generalized anxiety disorder: Secondary | ICD-10-CM | POA: Diagnosis not present

## 2016-11-08 DIAGNOSIS — Z8673 Personal history of transient ischemic attack (TIA), and cerebral infarction without residual deficits: Secondary | ICD-10-CM

## 2016-11-08 DIAGNOSIS — R42 Dizziness and giddiness: Secondary | ICD-10-CM

## 2016-11-08 LAB — CBC
HCT: 35.9 % — ABNORMAL LOW (ref 36.0–46.0)
Hemoglobin: 11.6 g/dL — ABNORMAL LOW (ref 12.0–15.0)
MCH: 26.3 pg (ref 26.0–34.0)
MCHC: 32.3 g/dL (ref 30.0–36.0)
MCV: 81.4 fL (ref 78.0–100.0)
Platelets: 216 10*3/uL (ref 150–400)
RBC: 4.41 MIL/uL (ref 3.87–5.11)
RDW: 13.3 % (ref 11.5–15.5)
WBC: 7.6 10*3/uL (ref 4.0–10.5)

## 2016-11-08 LAB — BASIC METABOLIC PANEL
Anion gap: 6 (ref 5–15)
BUN: 8 mg/dL (ref 6–20)
CO2: 30 mmol/L (ref 22–32)
Calcium: 8.5 mg/dL — ABNORMAL LOW (ref 8.9–10.3)
Chloride: 106 mmol/L (ref 101–111)
Creatinine, Ser: 0.87 mg/dL (ref 0.44–1.00)
GFR calc Af Amer: >60 mL/min (ref >60)
GFR calc non Af Amer: >60 mL/min (ref >60)
Glucose, Bld: 108 mg/dL — ABNORMAL HIGH (ref 65–99)
Potassium: 3.2 mmol/L — ABNORMAL LOW (ref 3.5–5.1)
Sodium: 142 mmol/L (ref 135–145)

## 2016-11-08 LAB — ECHOCARDIOGRAM COMPLETE
Height: 64 in
Weight: 2955.2 oz

## 2016-11-08 LAB — TROPONIN I
Troponin I: <0.03 ng/mL (ref <0.03)
Troponin I: <0.03 ng/mL (ref <0.03)

## 2016-11-08 MED ORDER — ALUM & MAG HYDROXIDE-SIMETH 200-200-20 MG/5ML PO SUSP
30.0000 mL | ORAL | Status: DC | PRN
  Administered 2016-11-08: 30 mL via ORAL
  Filled 2016-11-08: qty 30

## 2016-11-08 MED ORDER — POTASSIUM CHLORIDE CRYS ER 20 MEQ PO TBCR
40.0000 meq | EXTENDED_RELEASE_TABLET | Freq: Once | ORAL | Status: AC
  Administered 2016-11-08: 40 meq via ORAL
  Filled 2016-11-08: qty 2

## 2016-11-08 NOTE — Progress Notes (Signed)
Family Medicine Teaching Service Daily Progress Note Intern Pager: [phone]  Patient name: Erica Morris Medical record number: [id number] Date of birth: May 31, 1946 Age: 69 y.o. Gender: female  Primary Care Provider: Zigmund Gottron, MD Consultants: none Code Status: full  Pt Overview and Major Events to Date:  12/9- admitted to FPTS  Assessment and Plan: Erica Morris is a 70 y.o. female presenting with syncope. PMH is significant for HTN, hypothyroidism, anxiety, depression, GERD, TIA, and hyperlipidemia  Syncope- Witnessed event at home by son. CT head negative for acute fracture or intracranial pathology. Differential includes orthostatic hypotension worsened by dehydration and poor oral intake over past 48 hours, cardiac arrhythmia, vertigo. Less likely ACS, initial troponin 0.00, ekg without ischemic changes in ED. No signs of infection, afebrile, WBC normal at 8.5. Urinalysis with small leukocytes. No dysuria. No evidence of seizure like activity. Also less likeley differential includes PE, but with normal vitals and no SOB or evidence of clinical DVT less likely. Wells score 0 -admit for observation on telemetry, attending Dr. Ree Kida -continuous cardiac monitoring -vitals per floor -up with assistance, fall precautions -gentle IVF, 75 cc/hr -orthostatic vitals- negative -echocardiogram- pending -troponin negative x 3 -am EKG with bradycardia -am BMP, CBC without abnormalities -PT/OT consult, will order vestibular rehab  Vertigo- Mild vertigo at this time, physiologic horizontal nystagmus noted, present on previous evalutaion in 04/2016. Likley contributing to he presentation - continue meclizine PRN  - PT/OT as above - falls precautions as above  HTN- BP wnl, takes norvasc 5 mg and lasix 20mg  at home -continue norvasc and lasix -monitor BP  Difficult Social Situation- Currently staying in a home with 7 people and shares a room with 5 other people and she has  to sleep on the floor. - SW consult  Hypothyroidism- last TSH 3.66 in September 2017. Takes synthroid daily -continue synthroid 52mcg daily  GERD- hx of hiatal hernia, takes protonix at home -protonix 40mg  daily  Hypokalemia- noted to be 3.0 on admission, improved to 3.2 -replace with kdur 40 meq today -am BMP  Anxiety/depression- takes prozac 40 mg daily. Given daily panic attacks, concern her prozac is insufficient. Concern her reduced PO intake is partly due to this. Weight overall stable at 185 -increase to prozac 60 mg daily from 40mg  -consider adding buspar if she become acutely anxious -patient would benefit from outpatient behavioral health consult -consider SW consult  Hx of TIA/CVD- stable, daily aspirin at home -continue aspirin 81 mg  Hyperlipidemia- lipid panel 12/7 showed continued elevated total cholesterol to 252, LDL176 -continue home atorvastatin 40mg   Peripheral neuropathy- Stable -continue gabapentin  LE Rash - continue home ketokonazole  FEN/GI: heart healthy diet, NS 75cc/hr Prophylaxis: lovenox  Disposition: pending clinical improvement, PT/OT eval  Subjective:  Erica Morris is doing well, she still feels "swimmy headed" this morning. No further episodes of syncope. She was able to get some rest.  Objective: Temp:  [97.8 F (36.6 C)-98.7 F (37.1 C)] 97.8 F (36.6 C) (12/10 0532) Pulse Rate:  [52-77] 52 (12/10 0532) Resp:  [11-24] 20 (12/10 0532) BP: (124-146)/(57-81) 124/65 (12/10 0532) SpO2:  [95 %-99 %] 98 % (12/10 0532) Weight:  [184 lb 11.2 oz (83.8 kg)-185 lb 1.6 oz (84 kg)] 184 lb 11.2 oz (83.8 kg) (12/10 0532) Physical Exam: General: elderly lady in no acute distress Cardiovascular: RRR no MRG Respiratory: CTA bilaterally no increased work of breathing Abdomen: soft, nontender, nondistended, +BS Extremities: trace edema bilaterally  Laboratory:  Recent Labs Lab 11/07/16 1720  11/08/16 0218  WBC 8.5 7.6  HGB 12.2 11.6*   HCT 36.8 35.9*  PLT 225 216    Recent Labs Lab 11/07/16 1720 11/08/16 0218  NA 139 142  K 3.0* 3.2*  CL 103 106  CO2 27 30  BUN 9 8  CREATININE 0.98 0.87  CALCIUM 8.8* 8.5*  GLUCOSE 104* 108*     Imaging/Diagnostic Tests: Ct Head Wo Contrast  Result Date: 11/07/2016 CLINICAL DATA:  Syncope with fall.  Facial injury on the right. EXAM: CT HEAD WITHOUT CONTRAST TECHNIQUE: Contiguous axial images were obtained from the base of the skull through the vertex without intravenous contrast. COMPARISON:  Head CT 04/27/2016 FINDINGS: Brain: No evidence of acute infarction, hemorrhage, hydrocephalus, extra-axial collection or mass lesion/mass effect. Stable chronic small vessel ischemia. Vascular: Atherosclerosis of skullbase vasculature without hyperdense vessel or abnormal calcification. Skull: No fracture or acute abnormality. Sinuses/Orbits: Paranasal sinuses and mastoid air cells are clear. The visualized orbits are unremarkable. No evidence of fracture of the included facial bones. Other: None. IMPRESSION: No acute intracranial abnormality.  No fracture. Electronically Signed   By: Jeb Levering M.D.   On: 11/07/2016 19:32     Steve Rattler, DO 11/08/2016, 7:17 AM PGY-1, [place] Intern pager: [phone], text pages welcome

## 2016-11-08 NOTE — Progress Notes (Signed)
Physical Therapy  Vestibular Assessment Data    11/08/16 1744  Vestibular Assessment  General Observation Patient sitting upright in bed.  Patient reports she has had chronic vertigo for 4-5 years ago.  Symptom Behavior  Type of Dizziness Spinning ("Fuzzy" feeling in head; lightheadedness, and imbalance)  Frequency of Dizziness Throughout day  Duration of Dizziness Ongoing  Aggravating Factors Activity in general;Lying supine;Turning head quickly;Comment (Returning upright from bending forward)  Relieving Factors Closing eyes (Sitting upright;  Decreases dizziness but does not go away)  Occulomotor Exam  Occulomotor Alignment Normal  Spontaneous Right beating nystagmus (Difficult to see as patient keeps eyes fairly closed)  Gaze-induced Absent  Head shaking Horizontal Comment (Elicits dizziness but no nystagmus)  Smooth Pursuits Intact  Saccades Dysmetria  Positional Testing  Sidelying Test Sidelying Right;Sidelying Left  Sidelying Right  Sidelying Right Duration Ongoing  Sidelying Right Symptoms No nystagmus (Minimal dizziness)  Sidelying Left  Sidelying Left Duration Ongoing (Unable to remain in position as dizziness increased)  Sidelying Left Symptoms Other (comment) (Nystagmus present - unable to determine as pt closes eyes)  Cognition  Cognition Orientation Level Oriented x 4  Cognition Comment Patient repeats herself in conversation.    Positional Sensitivities  Up from Left Hallpike 3  Positional Sensitivities Comments Lt Hallpike with severe dizziness  Carita Pian. Sanjuana Kava, [place] Pager [phone]

## 2016-11-08 NOTE — Evaluation (Signed)
Occupational Therapy Evaluation Patient Details Name: Erica Morris MRN: [id number] DOB: 06-Apr-1946 Today's Date: 11/08/2016    History of Present Illness Erica Morris is a 70 y.o. female presenting with syncope. PMH is significant for HTN, hypothyroidism, anxiety, depression, GERD, TIA, and hyperlipidemia   Clinical Impression   Pt was independent prior to admission. Presents with dizziness and impaired standing balance interfering with ability to perform mobility and ADL at her baseline. Will follow acutely.   Follow Up Recommendations  No OT follow up    Equipment Recommendations       Recommendations for Other Services       Precautions / Restrictions Precautions Precautions: Fall      Mobility Bed Mobility Overal bed mobility: Modified Independent                Transfers Overall transfer level: Needs assistance Equipment used: None Transfers: Sit to/from Stand;Stand Pivot Transfers Sit to Stand: Supervision Stand pivot transfers: Min guard       General transfer comment: for safety due to dizziness    Balance                                            ADL Overall ADL's : Needs assistance/impaired Eating/Feeding: Independent;Sitting   Grooming: Wash/dry hands;Standing;Min guard   Upper Body Bathing: Set up;Sitting   Lower Body Bathing: Sit to/from stand;Supervison/ safety   Upper Body Dressing : Set up;Sitting   Lower Body Dressing: Supervision/safety;Sit to/from stand   Toilet Transfer: Min guard;Stand-pivot;BSC   Toileting- Water quality scientist and Hygiene: Supervision/safety;Sit to/from stand       Functional mobility during ADLs: Min guard       Vision Additional Comments: hx of cataract sx and retinal tear   Perception     Praxis      Pertinent Vitals/Pain Pain Assessment: No/denies pain     Hand Dominance Right   Extremity/Trunk Assessment Upper Extremity Assessment Upper Extremity  Assessment: Overall WFL for tasks assessed   Lower Extremity Assessment Lower Extremity Assessment: Defer to PT evaluation       Communication Communication Communication: No difficulties   Cognition Arousal/Alertness: Awake/alert Behavior During Therapy: WFL for tasks assessed/performed Overall Cognitive Status: Within Functional Limits for tasks assessed                     General Comments       Exercises       Shoulder Instructions      Home Living Family/patient expects to be discharged to:: Private residence Living Arrangements: Children (son and daughter in law and 3 grandchildren) Available Help at Discharge: Family;Available PRN/intermittently (daughter in law is also in the hospital) Type of Home: House Home Access: Ramped entrance     Home Layout: One level     Bathroom Shower/Tub: Walk-in shower;Tub/shower unit (typically gets down in the tub)   Bathroom Toilet: Standard     Home Equipment: Walker - 2 wheels;Cane - quad   Additional Comments: walker is at her daughter's home      Prior Functioning/Environment Level of Independence: Independent        Comments: has never driven        OT Problem List: Impaired balance (sitting and/or standing);Decreased knowledge of use of DME or AE   OT Treatment/Interventions: Self-care/ADL training;DME and/or AE instruction    OT Goals(Current goals  can be found in the care plan section) Acute Rehab OT Goals Patient Stated Goal: did not state OT Goal Formulation: With patient Time For Goal Achievement: 11/15/16 Potential to Achieve Goals: Good ADL Goals Pt Will Perform Grooming: with modified independence;standing Pt Will Transfer to Toilet: with modified independence;ambulating;regular height toilet Pt Will Perform Toileting - Clothing Manipulation and hygiene: with modified independence;sit to/from stand Pt Will Perform Tub/Shower Transfer: Tub transfer;Shower transfer;ambulating;with modified  independence (determine need for shower seat)  OT Frequency: Min 2X/week   Barriers to D/C:            Co-evaluation              End of Session Equipment Utilized During Treatment: Gait belt  Activity Tolerance: Patient tolerated treatment well Patient left: in bed;with call bell/phone within reach   Time: [id number] OT Time Calculation (min): 26 min Charges:  OT General Charges $OT Visit: 1 Procedure OT Evaluation $OT Eval Moderate Complexity: 1 Procedure OT Treatments $Self Care/Home Management : 8-22 mins G-Codes:    Malka So 11/08/2016, 2:43 PM  [phone]

## 2016-11-08 NOTE — Progress Notes (Signed)
Patient educated about importance of bed alarm during the night. Patient refused to be on bed alarm and stated that she will call If assistance is needed. Will continue to monitor and round on pt.  Jasmina  Cvijetic, RN

## 2016-11-08 NOTE — Evaluation (Signed)
Physical Therapy Evaluation Patient Details Name: Erica Morris MRN: [id number] DOB: August 01, 1946 Today's Date: 11/08/2016   History of Present Illness  Erica Morris is a 70 y.o. female presenting with syncope with fall.  Patient also with chronic vertigo impacting function.  PMH is significant for HTN, hypothyroidism, anxiety, depression, GERD, CVA/TIA, chronic vertigo, and hyperlipidemia    Clinical Impression  Patient presents with problems listed below.  Will benefit from acute PT to maximize functional independence prior to discharge home with family.  Patient with chronic vertigo - patient reports it started "about the time of my stroke".  Patient appears to have central vertigo with possibly Lt posterior canal canalithiasis/cupulolithiasis as well.  See Vestibular Assessment note below.  Recommend OP PT for Vestibular Rehab at d/c.  Will follow acutely.    Follow Up Recommendations Outpatient PT;Supervision - Intermittent (OP PT for Vestibular Rehab)    Equipment Recommendations  None recommended by PT    Recommendations for Other Services       Precautions / Restrictions Precautions Precautions: Fall Precaution Comments: Dizziness Restrictions Weight Bearing Restrictions: No      Mobility  Bed Mobility Overal bed mobility: Modified Independent             General bed mobility comments: Increased time.  Transfers Overall transfer level: Needs assistance Equipment used: None Transfers: Sit to/from Stand Sit to Stand: Supervision Stand pivot transfers: Min guard       General transfer comment: Supervision for safety due to dizziness.  Ambulation/Gait Ambulation/Gait assistance: Min guard Ambulation Distance (Feet): 180 Feet Assistive device: None Gait Pattern/deviations: Step-through pattern;Decreased stride length;Shuffle;Drifts right/left Gait velocity: decreased Gait velocity interpretation: Below normal speed for age/gender General Gait Details:  Patient drifting to both sides due to dizziness.  No loss of balance during gait - able to self-correct.  Stairs            Wheelchair Mobility    Modified Rankin (Stroke Patients Only)       Balance Overall balance assessment: Needs assistance;History of Falls Sitting-balance support: No upper extremity supported;Feet supported Sitting balance-Leahy Scale: Good     Standing balance support: No upper extremity supported Standing balance-Leahy Scale: Fair Standing balance comment: Requires assist for dynamic activities.  Patient can recall/describe 4 recent falls due to dizziness/Lt knee buckling.                             Pertinent Vitals/Pain Pain Assessment: No/denies pain    Home Living Family/patient expects to be discharged to:: Private residence Living Arrangements: Children (son, dtr-in-law, 3 grandchildren) Available Help at Discharge: Family;Available PRN/intermittently (daughter-in-law is currently in hospital) Type of Home: House Home Access: Ramped entrance     Home Layout: One level Home Equipment: Walker - 2 wheels;Cane - quad Additional Comments: walker is at her daughter's home    Prior Function Level of Independence: Independent         Comments: has never driven     Hand Dominance   Dominant Hand: Right    Extremity/Trunk Assessment   Upper Extremity Assessment: Defer to OT evaluation           Lower Extremity Assessment: Overall WFL for tasks assessed         Communication   Communication: No difficulties  Cognition Arousal/Alertness: Awake/alert Behavior During Therapy: WFL for tasks assessed/performed;Anxious Overall Cognitive Status: Within Functional Limits for tasks assessed  General Comments General comments (skin integrity, edema, etc.): Initiated Vestibular Assessment - see note below.  Patient with some testing indicating central cause.  Also, Lt Dix-Hallpike was  positive.  However patient unable to tolerate treatment due to dizziness becoming worse while on bed with head downward.    Exercises     Assessment/Plan    PT Assessment Patient needs continued PT services  PT Problem List Decreased balance;Decreased activity tolerance;Decreased mobility;Decreased knowledge of use of DME          PT Treatment Interventions DME instruction;Gait training;Functional mobility training;Therapeutic activities;Therapeutic exercise;Balance training;Patient/family education (Vestibular Rehab)    PT Goals (Current goals can be found in the Care Plan section)  Acute Rehab PT Goals Patient Stated Goal: None stated PT Goal Formulation: With patient Time For Goal Achievement: 11/15/16 Potential to Achieve Goals: Good    Frequency Min 4X/week   Barriers to discharge Decreased caregiver support Does not have 24 hour assist per patient.    Co-evaluation               End of Session Equipment Utilized During Treatment: Gait belt Activity Tolerance: Other (comment) (Limited by dizziness) Patient left: in bed;with call bell/phone within reach (Sitting cross-legged in bed eating dinner.) Nurse Communication: Mobility status (Recommend f/u OP PT for Vestibular Rehab)    Functional Assessment Tool Used: Clinical Judgement Functional Limitation: Mobility: Walking and moving around Mobility: Walking and Moving Around Current Status [id number]): At least 1 percent but less than 20 percent impaired, limited or restricted Mobility: Walking and Moving Around Goal Status [phone]): At least 1 percent but less than 20 percent impaired, limited or restricted    Time: [id number] PT Time Calculation (min) (ACUTE ONLY): 36 min   Charges:   PT Evaluation $PT Eval Moderate Complexity: 1 Procedure PT Treatments $Gait Training: 8-22 mins   PT G Codes:   PT G-Codes **NOT FOR INPATIENT CLASS** Functional Assessment Tool Used: Clinical Judgement Functional Limitation:  Mobility: Walking and moving around Mobility: Walking and Moving Around Current Status [id number]): At least 1 percent but less than 20 percent impaired, limited or restricted Mobility: Walking and Moving Around Goal Status [phone]): At least 1 percent but less than 20 percent impaired, limited or restricted    Despina Pole 11/08/2016, 5:46 PM Carita Pian. Sanjuana Kava, [place] Pager [phone]

## 2016-11-09 ENCOUNTER — Other Ambulatory Visit: Payer: Self-pay

## 2016-11-09 DIAGNOSIS — F339 Major depressive disorder, recurrent, unspecified: Secondary | ICD-10-CM | POA: Diagnosis not present

## 2016-11-09 DIAGNOSIS — R55 Syncope and collapse: Secondary | ICD-10-CM | POA: Diagnosis not present

## 2016-11-09 DIAGNOSIS — Z8673 Personal history of transient ischemic attack (TIA), and cerebral infarction without residual deficits: Secondary | ICD-10-CM | POA: Diagnosis not present

## 2016-11-09 DIAGNOSIS — F411 Generalized anxiety disorder: Secondary | ICD-10-CM | POA: Diagnosis not present

## 2016-11-09 DIAGNOSIS — H8110 Benign paroxysmal vertigo, unspecified ear: Secondary | ICD-10-CM

## 2016-11-09 LAB — BASIC METABOLIC PANEL
Anion gap: 8 (ref 5–15)
BUN: 10 mg/dL (ref 6–20)
CO2: 29 mmol/L (ref 22–32)
Calcium: 8.8 mg/dL — ABNORMAL LOW (ref 8.9–10.3)
Chloride: 103 mmol/L (ref 101–111)
Creatinine, Ser: 0.97 mg/dL (ref 0.44–1.00)
GFR calc Af Amer: >60 mL/min (ref >60)
GFR calc non Af Amer: 58 mL/min — ABNORMAL LOW (ref >60)
Glucose, Bld: 117 mg/dL — ABNORMAL HIGH (ref 65–99)
Potassium: 3.6 mmol/L (ref 3.5–5.1)
Sodium: 140 mmol/L (ref 135–145)

## 2016-11-09 LAB — CBC
HCT: 35.3 % — ABNORMAL LOW (ref 36.0–46.0)
Hemoglobin: 11.4 g/dL — ABNORMAL LOW (ref 12.0–15.0)
MCH: 26.7 pg (ref 26.0–34.0)
MCHC: 32.3 g/dL (ref 30.0–36.0)
MCV: 82.7 fL (ref 78.0–100.0)
Platelets: 219 10*3/uL (ref 150–400)
RBC: 4.27 MIL/uL (ref 3.87–5.11)
RDW: 13.7 % (ref 11.5–15.5)
WBC: 6.8 10*3/uL (ref 4.0–10.5)

## 2016-11-09 LAB — MAGNESIUM: Magnesium: 1.9 mg/dL (ref 1.7–2.4)

## 2016-11-09 MED ORDER — FLUOXETINE HCL 20 MG PO CAPS: 60.0000 mg | ORAL_CAPSULE | Freq: Every day | ORAL | 2 refills | Status: DC

## 2016-11-09 MED ORDER — MECLIZINE HCL 25 MG PO TABS
25.0000 mg | ORAL_TABLET | Freq: Three times a day (TID) | ORAL | Status: DC
  Administered 2016-11-09: 25 mg via ORAL

## 2016-11-09 NOTE — Clinical Social Work Note (Signed)
Clinical Social Work Assessment  Patient Details  Name: Erica Morris MRN: [id number] Date of Birth: 07/25/1946  Date of referral:  11/07/16               Reason for consult:  Housing Concerns/Homelessness, [employer], Museum/gallery curator Concerns                Permission sought to share information with:    Permission granted to share information::  No  Name::        Agency::     Relationship::     Contact Information:     Housing/Transportation Living arrangements for the past 2 months:  Single Family Home Source of Information:  Patient, Medical Team Patient Interpreter Needed:  None Criminal Activity/Legal Involvement Pertinent to Current Situation/Hospitalization:  No - Comment as needed Significant Relationships:  Adult Children, Other Family Members Lives with:  Adult Children, Pets, Other (Comment) (Grandchildren) Do you feel safe going back to the place where you live?  Yes Need for family participation in patient care:  Yes (Comment)  Care giving concerns:  Patient is living with her son, daughter-in-law, three grandchildren, and numerous pets. Stressful living situation.   Social Worker assessment / plan:  CSW met with patient. No supports at bedside. CSW introduced role and inquired about concerns regarding living situation. Patient is living with her son, daughter-in-law, three grandchildren, and numerous pets. This is stressful for patient who states that her grandchildren are "hateful." Patient hopes to move out at some point. CSW provided low-income housing resources for [place] and [place], community resources for both counties, and food resources for both counties. Patient's daughter-in-law is currently hospitalized and they are concerned that there will be lack of food in the house since she cannot go grocery shopping with her food stamps. Patient receives her food stamps on 12/15. This is the reason CSW provided food resources. No further concerns. CSW  encouraged patient to contact CSW as needed. CSW will sign off as social work intervention is no longer needed.  Employment status:  Retired Nurse, adult PT Recommendations:   (Outpatient PT) Information / Referral to community resources:  Other (Comment Required) (Community resources, Low-income housing)  Patient/Family's Response to care:  Patient is agreeable to receiving resources. Patient's son is supportive and involved in patient's care. Patient appreciated social work intervention.  Patient/Family's Understanding of and Emotional Response to Diagnosis, Current Treatment, and Prognosis:  Patient voiced concerns over still not knowing what was wrong with her medically. Patient appears happy with hospital care aside from not knowing what is wrong yet.  Emotional Assessment Appearance:  Appears stated age Attitude/Demeanor/Rapport:  Other (Pleasant) Affect (typically observed):  Accepting, Appropriate, Calm, Pleasant Orientation:  Oriented to Self, Oriented to Place, Oriented to  Time, Oriented to Situation Alcohol / Substance use:  Never Used Psych involvement (Current and /or in the community):  No (Comment)  Discharge Needs  Concerns to be addressed:  Lack of Support Readmission within the last 30 days:  No Current discharge risk:  Other (Home stress) Barriers to Discharge:  No Barriers Identified   Candie Chroman, LCSW 11/09/2016, 1:27 PM

## 2016-11-09 NOTE — Progress Notes (Signed)
PCP/Social visit.  Patient seen.  She seems to be at her baseline (I have only met once) from a cognitive standpoint.  I reviewed the history of the event and it sounds like she had syncope with likely a full loss of consciousness (does not remember hitting face).  Out only briefly.  No warning symptoms other than her near constant vertigo.  No seizure activity.  Returned to full function quickly.  Agree with workup thus far.  I doubt we will be able to identify a specific cause.  Of note, she may be at slightly higher risk for seizure in that I DCed her clonazepam  (I also increased her gabapentin which has modest antiseizure effects.)  Given the history, seizure is quite low on my diff dx.    Appreciate excellent care of team.  I will see again in outpatient follow up.

## 2016-11-09 NOTE — Discharge Summary (Signed)
[facility] Discharge Summary  Patient name: Erica Morris Medical record number: [id number] Date of birth: June 25, 1946 Age: 70 y.o. Gender: female Date of Admission: 11/07/2016  Date of Discharge: 11/09/16 Admitting Physician: Lupita Dawn, MD  Primary Care Provider: Zigmund Gottron, MD Consultants: neurology, PT/OT  Indication for Hospitalization: syncope  Discharge Diagnoses/Problem List:  Syncope Vertigo   Disposition: home  Discharge Condition: stable, improved  Discharge Exam: see progress note from 12/11  Brief Hospital Course:  Erica Morris Allredis a 70 y.o.femalewho presented to [facility] ED with syncope. PMH significant for HTN, vertigo, hypothyroidism, anxiety, depression, GERD, TIA, and hyperlipidemia.  Cause for syncope thought to be related to a combination of vertigo and orthostatic hypotension. She was monitored on telemetry with no cardiac events noted during her hospitalization. Her troponin was negative x4 and EKG was normal. Echocardiogram with EF of 60-65%. She was evaluated by PT and was recommended to continue outpatient vestibular rehab. OT saw her as well and did not recommend any follow up. Social work was consulted due to complicated social situation regarding housing. She was provided with resources for low income housing and food. She was discharged home in stable condition on 11/09/16 with close PCP follow up.  Issues for Follow Up:  1. Recommend patient receives vestibular rehab with PT as outpatient 2. Consider behavioral health consult as outpatient, patient endorsing daily panic attacks 3. Consider weaning patient off Prozac as this can precipitate falls in elderly 4. Follow up with social work needs re: food and housing  Significant Procedures: none  Significant Labs and Imaging:   Recent Labs Lab 11/07/16 1720 11/08/16 0218 11/09/16 0545  WBC 8.5 7.6 6.8  HGB 12.2 11.6* 11.4*  HCT 36.8 35.9* 35.3*   PLT 225 216 219    Recent Labs Lab 11/07/16 1720 11/08/16 0218 11/09/16 0545  NA 139 142 140  K 3.0* 3.2* 3.6  CL 103 106 103  CO2 27 30 29   GLUCOSE 104* 108* 117*  BUN 9 8 10   CREATININE 0.98 0.87 0.97  CALCIUM 8.8* 8.5* 8.8*  MG  --   --  1.9    Ct Head Wo Contrast  Result Date: 11/07/2016 CLINICAL DATA:  Syncope with fall.  Facial injury on the right. EXAM: CT HEAD WITHOUT CONTRAST TECHNIQUE: Contiguous axial images were obtained from the base of the skull through the vertex without intravenous contrast. COMPARISON:  Head CT 04/27/2016 FINDINGS: Brain: No evidence of acute infarction, hemorrhage, hydrocephalus, extra-axial collection or mass lesion/mass effect. Stable chronic small vessel ischemia. Vascular: Atherosclerosis of skullbase vasculature without hyperdense vessel or abnormal calcification. Skull: No fracture or acute abnormality. Sinuses/Orbits: Paranasal sinuses and mastoid air cells are clear. The visualized orbits are unremarkable. No evidence of fracture of the included facial bones. Other: None. IMPRESSION: No acute intracranial abnormality.  No fracture. Electronically Signed   By: Jeb Levering M.D.   On: 11/07/2016 19:32   Study Conclusions  - Left ventricle: The cavity size was normal. Wall thickness was   increased in a pattern of mild LVH. Systolic function was normal.   The estimated ejection fraction was in the range of 60% to 65%.   Diastolic function is abnormal, indeterminate grade. There is   evidence of elevated LA pressure. Wall motion was normal; there   were no regional wall motion abnormalities. - Aortic valve: Valve area (VTI): 2.14 cm^2. Valve area (Vmax):   2.23 cm^2. - Mitral valve: There was mild regurgitation. - Left  atrium: The atrium was moderately dilated. - Systemic veins: IVC is small, suggesting low RA pressure and   hypovolemia. - Technically adequate study.  Results/Tests Pending at Time of Discharge: none  Discharge  Medications:    Medication List    TAKE these medications   amLODipine 5 MG tablet Commonly known as:  NORVASC Take 1 tablet (5 mg total) by mouth daily. TAKE 1 TABLET(2.5 MG) BY MOUTH DAILY   aspirin EC 81 MG tablet Take 81 mg by mouth every morning.   atorvastatin 40 MG tablet Commonly known as:  LIPITOR Take 1 tablet (40 mg total) by mouth daily.   FLUoxetine 20 MG capsule Commonly known as:  PROZAC Take 3 capsules (60 mg total) by mouth daily. What changed:  See the new instructions.   furosemide 20 MG tablet Commonly known as:  LASIX Take 1 tablet (20 mg total) by mouth daily.   gabapentin 100 MG capsule Commonly known as:  NEURONTIN Take 1 capsule (100 mg total) by mouth 3 (three) times daily.   ketoconazole 2 % cream Commonly known as:  NIZORAL Apply 1 application topically daily.   levothyroxine 25 MCG tablet Commonly known as:  SYNTHROID, LEVOTHROID Take 1 tablet (25 mcg total) by mouth daily.   meclizine 25 MG tablet Commonly known as:  ANTIVERT Take 1 tablet (25 mg total) by mouth 3 (three) times daily as needed for dizziness.   pantoprazole 40 MG tablet Commonly known as:  PROTONIX TAKE 1 TABLET(40 MG) BY MOUTH DAILY       Discharge Instructions: Please refer to Patient Instructions section of EMR for full details.  Patient was counseled important signs and symptoms that should prompt return to medical care, changes in medications, dietary instructions, activity restrictions, and follow up appointments.   Follow-Up Appointments: Follow-up Information    Zigmund Gottron, MD. Go on 11/16/2016.   Specialty:  Family Medicine Why:  @2 :45PM Contact information: [place] [address] [phone]           Steve Rattler, DO 11/10/2016, 9:16 AM PGY-1, [place]

## 2016-11-09 NOTE — Progress Notes (Signed)
Family Medicine Teaching Service Daily Progress Note Intern Pager: [phone]  Patient name: Erica Morris Medical record number: [id number] Date of birth: 02/07/1946 Age: 70 y.o. Gender: female  Primary Care Provider: Zigmund Gottron, MD Consultants: PT/OT Code Status: full  Pt Overview and Major Events to Date:  12/9- admitted to FPTS  Assessment and Plan: Erica Morris is a 70 y.o. female presenting with syncope. PMH is significant for HTN, hypothyroidism, anxiety, depression, GERD, TIA, and hyperlipidemia  Syncope- Witnessed event at home by son. Differential includes orthostatic hypotension worsened by dehydration and poor oral intake vs cardiac arrhythmia vs vertigo. -continuous cardiac monitoring -vitals per floor -up with assistance, fall precautions -orthostatic vitals- normal -echocardiogram- normal -PT/OT following, PT recommending vestibular rehab  Vertigo- Mild vertigo at this time, physiologic horizontal nystagmus noted, present on previous evalutaion in 04/2016. Likley contributing to her presentation -continue meclizine PRN -PT/OT- Pt recommending outpatient PT for vestibular rehab, OT no f/up -falls precautions as above  HTN- BP wnl, takes norvasc 5 mg and lasix 20mg  at home. 118/60 overnight. -continue norvasc and lasix -monitor BP  Difficult Social Situation- Currently staying in a home with 7 people and shares a room with 5 other people and she has to sleep on the floor. - SW consult  Hypokalemia- noted to be 3.0 on admission. Corrected to 3.6. -continue kdur 40 meq -monitor  Anxiety/depression- takes prozac 40 mg daily. Given daily panic attacks, concern her prozac is insufficient. Concern her reduced PO intake is partly due to this. -increased prozac to 60 mg daily from 40mg  -consider adding buspar if she become acutely anxious -patient would benefit from outpatient behavioral health consult -SW consult  FEN/GI: heart healthy  diet Prophylaxis: lovenox  Disposition: pending clinical improvement  Subjective:  Erica Morris is feeling "swimmy headed" still, worse with movements. She feels lightheaded when getting up still. Denies chest pain, palpitations, weakness.   Objective: Temp:  [97.5 F (36.4 C)-98.5 F (36.9 C)] 98 F (36.7 C) (12/11 0612) Pulse Rate:  [50-59] 50 (12/11 0612) Resp:  [18-20] 18 (12/11 0612) BP: (118-134)/(60-67) 118/60 (12/11 0612) SpO2:  [98 %-100 %] 98 % (12/11 0612) Weight:  [184 lb 3.2 oz (83.6 kg)] 184 lb 3.2 oz (83.6 kg) (12/11 [id number]) Physical Exam: General: elderly lady, laying in bed, in NAD Cardiovascular: RRR no MRG Respiratory: clear to auscultation bilaterally Abdomen: soft, non-tender, +BS Extremities: warm, well perfused. Rash bilaterally back of legs  Laboratory:  Recent Labs Lab 11/07/16 1720 11/08/16 0218 11/09/16 0545  WBC 8.5 7.6 6.8  HGB 12.2 11.6* 11.4*  HCT 36.8 35.9* 35.3*  PLT 225 216 219    Recent Labs Lab 11/07/16 1720 11/08/16 0218 11/09/16 0545  NA 139 142 140  K 3.0* 3.2* 3.6  CL 103 106 103  CO2 27 30 29   BUN 9 8 10   CREATININE 0.98 0.87 0.97  CALCIUM 8.8* 8.5* 8.8*  GLUCOSE 104* 108* 117*    Imaging/Diagnostic Tests: Ct Head Wo Contrast  Result Date: 11/07/2016 CLINICAL DATA:  Syncope with fall.  Facial injury on the right. EXAM: CT HEAD WITHOUT CONTRAST TECHNIQUE: Contiguous axial images were obtained from the base of the skull through the vertex without intravenous contrast. COMPARISON:  Head CT 04/27/2016 FINDINGS: Brain: No evidence of acute infarction, hemorrhage, hydrocephalus, extra-axial collection or mass lesion/mass effect. Stable chronic small vessel ischemia. Vascular: Atherosclerosis of skullbase vasculature without hyperdense vessel or abnormal calcification. Skull: No fracture or acute abnormality. Sinuses/Orbits: Paranasal sinuses and mastoid air cells are  clear. The visualized orbits are unremarkable. No evidence of  fracture of the included facial bones. Other: None. IMPRESSION: No acute intracranial abnormality.  No fracture. Electronically Signed   By: Jeb Levering M.D.   On: 11/07/2016 19:32     Steve Rattler, DO 11/09/2016, 8:09 AM PGY-1, [place] Intern pager: [phone], text pages welcome

## 2016-11-09 NOTE — Progress Notes (Signed)
Patient with no complaints or concerns during 7pm - 7am shift.  Jasmina Cvijetic, RN 

## 2016-11-09 NOTE — Progress Notes (Signed)
Pt has orders to be discharged. Discharge instructions given and pt has no additional questions at this time. Medication regimen reviewed and pt educated. Pt verbalized understanding and has no additional questions. Telemetry box removed. IV removed and site in good condition. Pt stable and waiting for transportation.   Bethany Scherer RN 

## 2016-11-09 NOTE — Discharge Instructions (Signed)
Benign Positional Vertigo Introduction Vertigo is the feeling that you or your surroundings are moving when they are not. Benign positional vertigo is the most common form of vertigo. The cause of this condition is not serious (is benign). This condition is triggered by certain movements and positions (is positional). This condition can be dangerous if it occurs while you are doing something that could endanger you or others, such as driving. What are the causes? In many cases, the cause of this condition is not known. It may be caused by a disturbance in an area of the inner ear that helps your brain to sense movement and balance. This disturbance can be caused by a viral infection (labyrinthitis), head injury, or repetitive motion. What increases the risk? This condition is more likely to develop in:  Women.  People who are 50 years of age or older. What are the signs or symptoms? Symptoms of this condition usually happen when you move your head or your eyes in different directions. Symptoms may start suddenly, and they usually last for less than a minute. Symptoms may include:  Loss of balance and falling.  Feeling like you are spinning or moving.  Feeling like your surroundings are spinning or moving.  Nausea and vomiting.  Blurred vision.  Dizziness.  Involuntary eye movement (nystagmus). Symptoms can be mild and cause only slight annoyance, or they can be severe and interfere with daily life. Episodes of benign positional vertigo may return (recur) over time, and they may be triggered by certain movements. Symptoms may improve over time. How is this diagnosed? This condition is usually diagnosed by medical history and a physical exam of the head, neck, and ears. You may be referred to a health care provider who specializes in ear, nose, and throat (ENT) problems (otolaryngologist) or a provider who specializes in disorders of the nervous system (neurologist). You may have  additional testing, including:  MRI.  A CT scan.  Eye movement tests. Your health care provider may ask you to change positions quickly while he or she watches you for symptoms of benign positional vertigo, such as nystagmus. Eye movement may be tested with an electronystagmogram (ENG), caloric stimulation, the Dix-Hallpike test, or the roll test.  An electroencephalogram (EEG). This records electrical activity in your brain.  Hearing tests. How is this treated? Usually, your health care provider will treat this by moving your head in specific positions to adjust your inner ear back to normal. Surgery may be needed in severe cases, but this is rare. In some cases, benign positional vertigo may resolve on its own in 2-4 weeks. Follow these instructions at home: Safety  Move slowly.Avoid sudden body or head movements.  Avoid driving.  Avoid operating heavy machinery.  Avoid doing any tasks that would be dangerous to you or others if a vertigo episode would occur.  If you have trouble walking or keeping your balance, try using a cane for stability. If you feel dizzy or unstable, sit down right away.  Return to your normal activities as told by your health care provider. Ask your health care provider what activities are safe for you. General instructions  Take over-the-counter and prescription medicines only as told by your health care provider.  Avoid certain positions or movements as told by your health care provider.  Drink enough fluid to keep your urine clear or pale yellow.  Keep all follow-up visits as told by your health care provider. This is important. Contact a health care provider if:    You have a fever.  Your condition gets worse or you develop new symptoms.  Your family or friends notice any behavioral changes.  Your nausea or vomiting gets worse.  You have numbness or a "pins and needles" sensation. Get help right away if:  You have difficulty speaking or  moving.  You are always dizzy.  You faint.  You develop severe headaches.  You have weakness in your legs or arms.  You have changes in your hearing or vision.  You develop a stiff neck.  You develop sensitivity to light. This information is not intended to replace advice given to you by your health care provider. Make sure you discuss any questions you have with your health care provider. Document Released: 08/24/2006 Document Revised: 04/23/2016 Document Reviewed: 03/11/2015  2017 Elsevier  

## 2016-11-09 NOTE — Progress Notes (Signed)
Patient discharged home prior to [facility] arranged. TCT patient via cell phone [phone]; patient stated that she does not want any HHC at this time. CM informed patient that if she changed her mind and wanted it after discharge, her PCP can make the arrangements from the office. Mindi Slicker [facility] [phone]

## 2016-11-09 NOTE — Consult Note (Signed)
   [facility] CM Inpatient Consult   11/09/2016  Erica Morris Jan 18, 1946 [id number]   Referral received for post hospital follow up and social needs. Patient evaluated for community based chronic disease management services with [place] [facility] as a benefit of patient's [employer]. Spoke with patient at bedside to explain [place] Management services.  Patient states she has a new primary care provider, Dr. Andria Frames states, "I just saw him but, my real problem is that I live with 9 people, 7 cats and a dog. Sometimes it's not easy to get food for all of those mouths and I am falling all of the time, all over the place."  [facility] [address].  Patient states she has gone a couple of weeks at times without her medications states, "If I don't have my social security check and I need a medicine, I will just go without until I get the money."  Consent form signed and welcome folder given with [place] Management services.   Patient will receive post hospital discharge call and will be evaluated for monthly home visits for assessments and disease process education.  Made Inpatient Case Manager aware that [place] Management following. Of note, [facility] Care Management services does not replace or interfere with any services that are arranged by inpatient case management or social work.  For additional questions or referrals please contact:    Natividad Brood, RN BSN [facility] Liaison  [phone] business mobile phone Toll free office [phone]

## 2016-11-10 ENCOUNTER — Other Ambulatory Visit: Payer: Self-pay | Admitting: *Deleted

## 2016-11-10 ENCOUNTER — Telehealth: Payer: Self-pay | Admitting: *Deleted

## 2016-11-10 ENCOUNTER — Encounter: Payer: Self-pay | Admitting: *Deleted

## 2016-11-10 NOTE — Telephone Encounter (Signed)
Transitional Care Post-Discharge Follow-Up Phone Call:   Admit date: 11/07/2016 Discharge date: 11/09/2016  Discharge Disposition: Home  Best patient contact number: [phone] Emergency contact(s): Jerrell Mylar (son) PCP: Andria Frames  Principal Discharge Diagnosis: Syncope and collapse  Reason for Chronic Case Management: Co-morbidities as follows:  HTN, HLD, Hypothyroidism, Chronic Venous Insufficiency, Hx of CVA; 3 ED visits and 1 admission in 6 months.  Post-discharge Communication: (Clearly document all attempts clearly and date contact made)   Call Completed: Yes, with patient   Interpreter Needed: No   Please check all that apply:  X Patient is caring for self at home. Patient lives with son and daughter-in-law. 9 adults total in home. ? Patient has caregiver. If so, name and best contact number:  ? Patient is knowledgeable of his/her condition(s) and/or treatment.  ? Family and/or caregiver is knowledgeable of patient's condition(s) and/or treatment.   Medication Reconciliation:  X Medication list reviewed with patient.  X Patient has all discharge medications (Ability to afford, access, adherence, pharmacy) [facility] [address]. States has enough meds for 3 or 4 days but won't be able to pick up refills till 12/03/2015 when she receives her husband's social security check. States she has spoken with someone at [facility] who is trying to help her get her meds, should know later today.  ? Patient has O2/CPAP ordered? If so, name of company that supplies:  Activities of Daily Living:  X Independent  ? Needs assist (describe)  ? Total Care (describe)   Community resources in place for patient:  ? None  ? Home Health If so, name of agency: X [facility] If so, name of Care Manager and contact number: [name], RN ? Assisted Living  ? Hospice  ? Support Group  Patient states she also has Education officer, museum, Thermon Leyland, who helps her get food stamps.  Topics discussed:   Home Environment: (Apt? Home? Who patient lives with? Stairs? Railings? Ramp?) Patient lives in one level trailer that has ramp with railing. 9 adults total live in trailer.  Support System: Son and daughter-in-law, however, daughter-in-law, Lenna Sciara, is currently hospitalized at [facility] and son stays there with his wife.  Home DME: Ordered at discharge? Does patient have? Patient has walker but had not been using on consistent basis. Went to visit daughter-in-law at hospital and felt legs "start to give out." States she will be using walker regularly now.  Transportation: Barriers? Son has car and drives patient to her appts, however, is unavailable currently due to wife's hospitalization.  Food/Nutrition: (ability to afford, access, use of any community resources) States she receives $14 per month in food stamps and daughter-in-law also receives food stamps on the 15th of each month.  Would patient benefit from consult with Social Work? Behavioral Health? Pharm D? Patient has SW and [facility] involved in care  [facility] appointment scheduled for: Rescheduled HFU appt for Nov 18, 2016 at 1430 with Dr. Rosalyn Gess (part of in-patient team while patient was hospitalized) PCP unavailable till January.  Identified Barriers: Home living situation, ability to afford food and meds  Questions/concerns: Patient states that so far she is "doing OK" but hasn't gotten out of bed yet and hasn't taken AM meds. Asked me to call back this afternoon to let her now date and time of appt so she can write it down. Instructed pt I will call back today at 4 pm.             Hubbard Hartshorn, RN, BSN

## 2016-11-10 NOTE — Telephone Encounter (Signed)
Noted and agree. 

## 2016-11-10 NOTE — Patient Outreach (Addendum)
[name]) Care Management  11/10/2016  Erica Morris January 06, 1946 [id number]  Transition of care call  Spoke with patient reports she is doing fairly well on this morning.  Patient discussed her current living situation and financial concerns, patient has all medication from discharge except the new strength of Prozac, Erica Morris states she has 40 mg dose at home and new prescription is for 20 mg capsule and dose of 60 mg daily. Patient states she [place] unable to get new prescription filled until 12/03/15 because she does not have the money and her son or daughter is unable to help. Patient has taken her 40 mg dose that she has at home.  Patient admits that she has gone a couple  weeks at a time without medication waiting to get her check. Patient was recently discharged from hospital and all medications have been reviewed.  Spoke discussed her current living situation, staying with her son and his family 32  other people along with dogs and cats in home. Patient states she shares the room with 3 other people and they sleep on foam mattress. Patient discussed concern regarding food as daughter in law is currently hospitalized and limited food in home, patient states she ate oatmeal on last night and will it that again this morning.  Discussed benefit of [employer] well dine meals since she has recently been discharged from the hospital, patient declined at this time stating the others in the home my not have hot meal and she wouldn't feel right, she agreed that I can discuss it with her again on next week.   Erica Morris hopes that her daughter that she used to stay with in [place] will come and pick her up this weekend for a visit and she will talk with her about possibility of staying with her again.   Patient has follow up appointment with PCP on next week and states her son should be able to provide transportation.   Plan Will discuss this visit with Eduard Clos, LCSW that has  also been consulted Will consult with [facility] pharmacist  concern regarding patient unable to afford new dose of Prozac and concern regarding patient difficulty with affording  medications at times.   1710 Received update from [place], Pharmacisit, that [facility] services will be able to  assist patient with purchasing her new dose of Prozac . Placed call to Erica Morris to notify her that pharmacist will deliver her medication on 12/13 at 12 noon.   [name], RN, [place] Management [phone]- Mobile [phone]- Toll Free Main Office

## 2016-11-10 NOTE — Telephone Encounter (Signed)
Notified patient of appointment for HFU/Transitions of Care with Dr. Rosalyn Gess on 11/18/2016 at 1430.  Hubbard Hartshorn, RN, BSN

## 2016-11-10 NOTE — Patient Outreach (Signed)
[place] [facility]) Care Management  11/10/2016  Erica Morris 1946-02-08 [id number]   CSW made an initial attempt to try and contact patient today to perform phone assessment, as well as assess and assist with social needs and services, without success.  A HIPAA compliant message was left for patient on voicemail. Will await callback or try again later this week.   Eduard Clos, MSW, [place] Worker  [place] [phone]

## 2016-11-11 ENCOUNTER — Encounter: Payer: Self-pay | Admitting: *Deleted

## 2016-11-11 ENCOUNTER — Other Ambulatory Visit: Payer: Self-pay | Admitting: *Deleted

## 2016-11-11 NOTE — Patient Outreach (Signed)
[facility]) Care Management  11/11/2016  Erica Morris Mar 06, 1946 [id number]   CSW rec'd phone call back from patient- identity was confirmed and CSW explained reason for CSW call and referral.  Patient reports she is currently living in a 3 bedroom home with her son , his wife and her father, as well as 3 children (teens) and a friend of one of one of the teens. "there's 5 of [place] sleeping in one room'.  CSW discussed the housing arrangement which patient reports she pays $300 rent to the daughter in law. She was living with a daughter but states, "she decided she didn't want me there because space. "She wants her daughter to have her own room so there's no room for me">  CSW discussed potential other options for housing including ALF, her own apartment/home but she declines, stating; " I am going to talk to my daughter this weekend".  Patient denies any concerns related to going without food, medication, etc. She admits to struggling with anxiety and feels her options are limited.  CSW stressed and encouraged her to seek opportunities for housing with her daughter and plans for CSW to call back next week.   CSW will mail resources to her and plan f/u call next week to schedule/complete assessment. Eduard Clos, MSW, [place] Worker  [place] [phone]

## 2016-11-13 NOTE — Patient Outreach (Signed)
[place] [facility]) Care Management  11/13/2016  Erica Morris 30-Dec-1945 [id number]   Request received from Eduard Clos, LCSW to mail patient housing resources and assisted living facility information.   [name]  [facility] Care Management Assistant

## 2016-11-16 ENCOUNTER — Inpatient Hospital Stay: Payer: Commercial Managed Care - HMO | Admitting: Family Medicine

## 2016-11-16 ENCOUNTER — Telehealth: Payer: Self-pay | Admitting: *Deleted

## 2016-11-16 ENCOUNTER — Other Ambulatory Visit: Payer: Self-pay | Admitting: *Deleted

## 2016-11-16 NOTE — Telephone Encounter (Signed)
Noted patient cancelled TOC appt that was scheduled for 11/18/2016 at 2:30. Attempted to reach patient to reschedule Left message on VM requesting return call L. Silvano Rusk, RN, BSN

## 2016-11-16 NOTE — Telephone Encounter (Signed)
Reached patient to r/s HFU/TOC appt. Daughter-in-law, Lashaun Muscarello got on phone and stated patient cancelled appt for 12/20 due to inability to afford co-pay. Appt r/s for tomorrow at 1100 with Dr. Shawna Orleans (PCP unavailable) Patient will receive help from [place] for $20 co-pay per Philis Pique, RN. Hubbard Hartshorn, RN, BSN

## 2016-11-16 NOTE — Telephone Encounter (Signed)
Noted and agree. 

## 2016-11-17 ENCOUNTER — Encounter: Payer: Self-pay | Admitting: Family Medicine

## 2016-11-17 ENCOUNTER — Telehealth: Payer: Self-pay

## 2016-11-17 ENCOUNTER — Ambulatory Visit (INDEPENDENT_AMBULATORY_CARE_PROVIDER_SITE_OTHER): Payer: Commercial Managed Care - HMO | Admitting: Family Medicine

## 2016-11-17 VITALS — BP 128/84 | HR 63 | Temp 97.7°F | Wt 189.4 lb

## 2016-11-17 DIAGNOSIS — R42 Dizziness and giddiness: Secondary | ICD-10-CM | POA: Diagnosis not present

## 2016-11-17 DIAGNOSIS — I872 Venous insufficiency (chronic) (peripheral): Secondary | ICD-10-CM

## 2016-11-17 DIAGNOSIS — E039 Hypothyroidism, unspecified: Secondary | ICD-10-CM | POA: Insufficient documentation

## 2016-11-17 MED ORDER — CLOTRIMAZOLE-BETAMETHASONE 1-0.05 % EX CREA: 1.0000 "application " | TOPICAL_CREAM | Freq: Two times a day (BID) | CUTANEOUS | 0 refills | Status: DC

## 2016-11-17 MED ORDER — LEVOTHYROXINE SODIUM 25 MCG PO TABS: 25.0000 ug | ORAL_TABLET | Freq: Every day | ORAL | 0 refills | Status: DC

## 2016-11-17 MED ORDER — MECLIZINE HCL 25 MG PO TABS: 25.0000 mg | ORAL_TABLET | Freq: Three times a day (TID) | ORAL | 0 refills | Status: DC | PRN

## 2016-11-17 MED FILL — MECLIZINE 25 MG TABLET: 25 | 30 days supply | Qty: 90 | Fill #0

## 2016-11-17 MED FILL — CLOTRIMAZOLE-BETAMETHASONE: 1-0.05 | 15 days supply | Qty: 30 | Fill #0

## 2016-11-17 MED FILL — LEVOTHYROXINE 25 MCG TABLET: 25 | 30 days supply | Qty: 30 | Fill #0

## 2016-11-17 NOTE — Progress Notes (Signed)
TRANSITION OF CARE VISIT   Primary Care Physician (PCP): Dr. Asher Muir [facility]                                                   [address]                                                   [place], [place] [address]                                                   [place]    Date of Admission: 11/07/16  Date of Discharge: 11/09/16  Discharged from: [facility]  Discharge Diagnosis: Syncope, Vertigo  Summary of Admission: Erica Morris a 70 y.o.femalewith PMH of HTN, vertigo, hypothyroidism, anxiety, depression, GERD, TIA, hyperlipidemia who was admitted for syncope and vertigo  TODAY's VISIT  Patient/Caregiver self-reported problems/concerns: Since leaving the hospital has felt dizzy "like the room is spinning" multiple times and has come close to falling several times. Also endorses anxiety and panic attacks with her greatest stressor being her living situation. Denies CP, SOB, n/v.  MEDICATIONS  Medication Reconciliation conducted with patient/caregiver? Yes  New medications prescribed/discontinued upon discharge? (Yes):  Has stopped ketoconazole cream because patient felt was not effective. Will discontinue amlodipine today.  Barriers identified related to medications: Cost, will not be able to pick up medications until 12/03/16 when husband gets social security check. Is about to run out of antivert and levothyroxine, norvasc or lasix.  LABS  Lab Reviewed (Yes)  PHYSICAL EXAM:  Blood pressure 128/84, pulse 63, temperature 97.7 F (36.5 C), temperature source Oral, weight 189 lb 6.4 oz (85.9 kg), SpO2 97 %.  General: sitting in chair, in no acute distress HEENT: [place], AT. PERRL, EOMI. MMM CV: RRR, no murmurs. 2+ distal pulses Lungs: CTAB, normal effort. GI: Soft, nontender, nondistended. Extremities: LE with 2+ nonpitting edema. <3 sec cap  refill. Skin: Erythematous papular rash over calves with excoriations. Neuro: no focal deficits  ASSESSMENT: Vertigo - Referred to vestibular rehab - Refilled antivert - Consider tapering off prozac due to side effect profile. Note that this is also expressed in the patient's discharge summary from her recent hospital stay however on hospital discharge her home dose of prozac was increased from 40mg  daily to 20mg  TID.  Dizziness likely 2/2 orthostatic hypotension - Discontinue amlodipine at this time since blood pressure is well below goal and patient is continuing to be symptomatic at home.  - Encouraged good hydration.   Anxiety and panic attacks - Will likely benefit from CBT, is scheduled with [facility] for appt on 12/15/16. - Please consider psychiatry referral for medication management for possibly tapering off prozac due to vertigo and syncope, see above.  Will defer to PCP.  Skin rash likely stasis dermatitis - Clotrimazole-betamethasone topically   PATIENT EDUCATION PROVIDED: See AVS   FOLLOW-UP (Include any further testing or referrals):  - Referral to vestibular rehab - Patient to see integrated care - Follow up with PCP, will defer tapering of prozac to that visit.  Bufford Lope, DO PGY-1, [facility] 11/17/2016 4:52 PM

## 2016-11-17 NOTE — Patient Instructions (Signed)
It was good to meet you today for your hospital follow up:  For your dizziness and vertigo,  - I have placed a referral to vestibular rehab, please let us know if you have not heard anything from them in 2 weeks. - Please stay well hydrated. - Please stop taking your amlodipine (norvasc).  For your anxiety and panic attacks - Please follow up with your primary care doctor, he may decide psychiatry for medication management might be appropriate and may decide to eventually take you off the prozac medication  - Thank you for talking with our behavioral health team today about the benefits of cognitive behavioral therapy  We can offer you some medication assistance for this visit to get you through to January, please pick up your medication at the [facility].  Take care and seek immediate care sooner if you develop any concerns.   Dr. Bufford Lope, [place]

## 2016-11-17 NOTE — Patient Outreach (Signed)
[place] [facility]) Care Management  11/17/2016  Erica Morris [birth year] [id number]   CSW spoke with patient by phone on 11/16/16.  "I am in the [employer]". Patient reports she spoke to her daughter about coming back to live with her but no resolution. CSW encouraged patient to seek other options; section 8, housing authority, etc.  CSW will plan follow up call later this week for further updates on her progress. Patient declines home visit at this time and asks that CSW call back later since she is shopping.   Eduard Clos, MSW, [place] Worker  [place] [phone]

## 2016-11-17 NOTE — Telephone Encounter (Signed)
[facility] called to schedule followup appointment. Spoke to [place], patient's daughter in Sports coach. Appointment for Erica Morris was scheduled for January 16th at 1:30pm.

## 2016-11-17 NOTE — Progress Notes (Signed)
Dr. Shawna Orleans requested a Finger.   Presenting Issue:  Panic Attacks, stressful social situation  Report of symptoms:  Patient reports having panic attacks about twice every day. She reported that during these attacks, she feels like she might die and it is hard to breathe. Patient is also feeling somewhat unhappy living with her daughter-in-law [place]. Patient feels that none of her other children "wanted her" when she needed a place to stay and was upset by that. She also reported sometimes feeling like she isn't wanted with her daughter-in-law's family. Daughter-in-law expressed support of patient, stating that patient is very welcome in her home.  Duration of CURRENT symptoms:  Patient states she has been having panic attacks for over 10 years.   Assessment / Plan / Recommendations: Patient had limited time to meet with [facility] today due to her ride needing to leave. [facility] met with patient along with patient's daughter-in-law, who patient is currently staying with. [facility] briefly explained behavioral treatment for panic attacks and presented treatment options to patient. Patient's daughter-in-law encouraged patient to seek treatment and expressed that she thinks patient could benefit. Patient is open to being seen here by [facility] but would not like to go to an outpatient therapist. They considered seeing an outpatient therapist before but the cost was preventative. [facility] will schedule this followup appointment with patient's daughter in law.

## 2016-11-18 ENCOUNTER — Inpatient Hospital Stay: Payer: Commercial Managed Care - HMO | Admitting: Family Medicine

## 2016-11-18 ENCOUNTER — Other Ambulatory Visit: Payer: Self-pay | Admitting: *Deleted

## 2016-11-18 NOTE — Patient Outreach (Signed)
[place] [facility]) Care Management  11/18/2016  Erica Morris [birth year] [id number]   CSW spoke with patient by phone who reports she is "almost having a panic attack". Her family which she lives with are causing her stress; "I went into the bathroom to get away from them". CSW discussed ways to diminsh anxiety and feelings of panic- which she seems to be aware of. She is thinking about going back to a motel where she has lived in past; "it's $600 ".  CSW encouraged her to seek other housing optiosn that may use less of her limited income.  Patient ask that CSW call back after the holidays.  CSW will seek housing information to mail to her and plan f/u call in 1-2 weeks.   Eduard Clos, MSW, [place] Worker  [place] [phone]

## 2016-11-18 NOTE — Patient Outreach (Addendum)
[name]) Care Management  11/18/2016  Erica Morris 1946/08/20 [id number]  Transition of care call  Spoke with patient, reports she is doing fair. Patient discussed her recent MD office visit  and continued problems with vertigo and anxiety panic attacks. Patient states she is agreeable to having therapy for her vertigo . Patient also looking forward meeting with [facility] in January since  it will be at the family practice center. Patient reports she received refills on her medication on today, and states her daughter in law helped to remove Norvasc that was discontinued at office visit , from her regular scheduled medication.  Discussed benefit of [employer] well dine meals after discharge patient is now agreeable. Patient discussed the crowded living situation and she is considering going back to stay in the hotel, patient has declined scheduling home visit at this time, agreeable to discussing at next phone visit.   Plan Will place call to [facility] regarding well dine meals, updated patient on approximate arrival date of meals. Patient will continue to adhere to fall prevention measures, Will follow up with patient within next week for transition of care outreach and will again discuss home visit options.   [name], RN, [place] Management [phone]- Mobile [phone]- Toll Free Main Office

## 2016-11-26 ENCOUNTER — Other Ambulatory Visit: Payer: Self-pay | Admitting: *Deleted

## 2016-11-26 NOTE — Patient Outreach (Signed)
[facility]) Care Management  11/26/2016  ELA CHAMPION 03-22-1946 [id number]  Transition of care call  Spoke with patient, she discussed she didn't rest well last night due to having a cough. Patient denies having a fever, denies productive cough or sputum change in color or shortness of breath. Discussed with patient regarding arranging visit to see PCP, she declined assistance with this at this time,stressed importance of MD follow up if symptoms do not improve or worsen, and better to follow up sooner with office visit  she still wants to wait, she did agree to notify MD on next day if not improved.. Patient is currently staying with her daughter in [place] on this week and plans to return to her son's home in [place] on tomorrow evening. Patient discussed she is going to give it another month staying with her son and if it doesn't get any better she will move, she has been looking into another home to rent in [place], but states rent is too high.  Patient reports taking her medications as prescribed and no recent falls. Patient will check with her daughter in law whether her [employer] well dine meals have arrived. Patient voiced looking forward to visit with therapist on 1/16.  Patient reports she hasn't heard about setting up for outpatient therapy for vertigo yet, but she is not sure she wants to attend.    Plan Will follow up with patient initial  home visit in the next week. Patient will notify MD  office for worsening of symptoms. Placed call to outpatient vestibular rehab regarding referral, per representative they left message for patient on 12/20 to return call, they will reach out to patient today.  [name], RN, [place] Management [phone]- Mobile [phone]- Toll Free Main Office

## 2016-12-03 ENCOUNTER — Ambulatory Visit (INDEPENDENT_AMBULATORY_CARE_PROVIDER_SITE_OTHER): Payer: Medicare Other | Admitting: Family Medicine

## 2016-12-03 ENCOUNTER — Encounter: Payer: Self-pay | Admitting: Family Medicine

## 2016-12-03 ENCOUNTER — Other Ambulatory Visit: Payer: Self-pay | Admitting: Family Medicine

## 2016-12-03 VITALS — BP 152/78 | HR 56 | Temp 98.7°F | Ht 64.0 in | Wt 189.0 lb

## 2016-12-03 DIAGNOSIS — N3000 Acute cystitis without hematuria: Secondary | ICD-10-CM | POA: Diagnosis not present

## 2016-12-03 DIAGNOSIS — G609 Hereditary and idiopathic neuropathy, unspecified: Secondary | ICD-10-CM | POA: Diagnosis not present

## 2016-12-03 DIAGNOSIS — R300 Dysuria: Secondary | ICD-10-CM

## 2016-12-03 DIAGNOSIS — F411 Generalized anxiety disorder: Secondary | ICD-10-CM

## 2016-12-03 DIAGNOSIS — N390 Urinary tract infection, site not specified: Secondary | ICD-10-CM | POA: Insufficient documentation

## 2016-12-03 DIAGNOSIS — I872 Venous insufficiency (chronic) (peripheral): Secondary | ICD-10-CM | POA: Diagnosis not present

## 2016-12-03 LAB — POCT UA - MICROSCOPIC ONLY

## 2016-12-03 LAB — POCT URINALYSIS DIPSTICK
Bilirubin, UA: NEGATIVE
Blood, UA: NEGATIVE
Glucose, UA: NEGATIVE
Ketones, UA: NEGATIVE
Nitrite, UA: NEGATIVE
Spec Grav, UA: 1.02
Urobilinogen, UA: 0.2
pH, UA: 7.5

## 2016-12-03 MED ORDER — CLOTRIMAZOLE-BETAMETHASONE 1-0.05 % EX CREA: 1.0000 "application " | TOPICAL_CREAM | Freq: Two times a day (BID) | CUTANEOUS | 0 refills | Status: DC

## 2016-12-03 MED ORDER — GABAPENTIN 300 MG PO CAPS: 300.0000 mg | ORAL_CAPSULE | Freq: Three times a day (TID) | ORAL | 3 refills | Status: DC

## 2016-12-03 NOTE — Progress Notes (Signed)
   Subjective:    Patient ID: Erica Morris, female    DOB: 09-06-1946, 71 y.o.   MRN: [id number]  HPI [phone].  Or call daughter, Tressia Miners, [phone]. FU several issues: Her main complaint is anxiety.  States that prozac is not working.   Neuropathy, burning feet.  Not improved by gabapentin 100 mg.  Not sleepy on that dose. Still scratching legs.  Ointment helpd.   Has sx of dysuria.  No fever or chills.     Review of Systems     Objective:   Physical ExamLungs clear Cardiac RRR without m or g Abd benign, no cva tenderness Legs, still excoriated.        Assessment & Plan:

## 2016-12-03 NOTE — Patient Instructions (Addendum)
I will call with the urine culture results.  I do not think you have an infection, but I am doing a test to be sure.   Cut back the fluoxetine slowly.  No sense in taking a pill that is not working.  2 pills each morning for one week.  Then, one pill each morning for one week, then quit the fluoxetine. I will refill you leg ointment. I want to increase your gabapentin.  I hope the higher dose will help your nerves and help the burning in your feet and legs. See me in one month.  We will keep making changes until we get it right.

## 2016-12-04 ENCOUNTER — Other Ambulatory Visit: Payer: Self-pay | Admitting: *Deleted

## 2016-12-04 NOTE — Assessment & Plan Note (Signed)
Will wean off prozac and push dose of gabapentin, which is not an antianxiety med per se but does cause sedation at higher doses.

## 2016-12-04 NOTE — Assessment & Plan Note (Signed)
Push dose of gabapentin.

## 2016-12-04 NOTE — Patient Outreach (Signed)
[facility]) Care Management  12/04/2016  Erica Morris 1946/10/24 [id number]  Placed call to patient mobile number listed, no answer"not accepting calls at this time" and unable to leave a message . Placed call to home number listed, person answering phone identified self as Erica Morris, listed on [facility] consent , patient hipaa information verified, she provided contact number as to where I can reach patient, placed call to that number unable to leave a message. Melissa states patient is not living  with them at this time she is staying with her daughter .   Plan  Plan outreach call in the next week to follow up regarding transition of care and home visit as to where patient will be staying.  [name], RN, [place] Management [phone]- Mobile [phone]- Toll Free Main Office

## 2016-12-04 NOTE — Assessment & Plan Note (Signed)
Doubt.  Only treat if culture positive.

## 2016-12-07 ENCOUNTER — Other Ambulatory Visit: Payer: Self-pay | Admitting: *Deleted

## 2016-12-07 NOTE — Patient Outreach (Signed)
Erica Morris) Care Management  12/07/2016  Erica Morris Jul 09, 1946 [id number]  Transition of care call  Spoke with patient reports she is staying with her daughter in [place] for a  while she is not sure how long.  Patient discussed recent visit to PCP and medication changes .   Patient denies having a recent fall and discussed she did receive a  call from outpatient therapy but could not attend while she was living in [place] due to not having transportation,  she would be agreeable to going to therapy if she had a way since she is in [place] now.   Plan Home visited rescheduled for within the week. Will send in basket message to Erica Morris, with patient updated contact information and request for SCAT information .  [name], RN, [place] Management [phone]- Mobile [phone]- Toll Free Main Office

## 2016-12-08 NOTE — Patient Outreach (Signed)
   [facility]) Care Management  [facility] Social Work  12/08/2016  Erica Morris [birth year] [id number]  Subjective:  "I need help with transportation".    Current Medications:  Current Outpatient Prescriptions  Medication Sig Dispense Refill  . aspirin EC 81 MG tablet Take 81 mg by mouth every morning.     Marland Kitchen atorvastatin (LIPITOR) 40 MG tablet Take 1 tablet (40 mg total) by mouth daily. 90 tablet 3  . clotrimazole-betamethasone (LOTRISONE) cream Apply 1 application topically 2 (two) times daily. 30 g 0  . furosemide (LASIX) 20 MG tablet Take 1 tablet (20 mg total) by mouth daily. 30 tablet 0  . gabapentin (NEURONTIN) 300 MG capsule Take 1 capsule (300 mg total) by mouth 3 (three) times daily. 90 capsule 3  . ketoconazole (NIZORAL) 2 % cream Apply 1 application topically daily. 30 g 0  . levothyroxine (SYNTHROID, LEVOTHROID) 25 MCG tablet Take 1 tablet (25 mcg total) by mouth daily. 30 tablet 0  . meclizine (ANTIVERT) 25 MG tablet Take 1 tablet (25 mg total) by mouth 3 (three) times daily as needed for dizziness. 90 tablet 0  . pantoprazole (PROTONIX) 40 MG tablet TAKE 1 TABLET(40 MG) BY MOUTH DAILY 30 tablet 5   No current facility-administered medications for this visit.     Functional Status:  In your present state of health, do you have any difficulty performing the following activities: 11/10/2016 11/07/2016  Hearing? N N  Vision? N N  Difficulty concentrating or making decisions? Y N  Walking or climbing stairs? Y N  Dressing or bathing? N N  Doing errands, shopping? Y N  Preparing Food and eating ? Y -  Using the Toilet? N -  In the past six months, have you accidently leaked urine? Y -  Do you have problems with loss of bowel control? N -  Managing your Medications? N -  Managing your Finances? N -  Housekeeping or managing your Housekeeping? Y -  Some recent data might be hidden    Fall/Depression Screening:  PHQ 2/9 Scores 12/03/2016 11/10/2016  11/05/2016 06/09/2016  PHQ - 2 Score 0 4 2 1   PHQ- 9 Score - 11 - -    Assessment:  CSW spoke with patient by phone and will assist with SCAT application.  Patient continues to be not happy with living with her daughter or her son and yet has denied considering other options (ALF).  CSW will assist with SCAT application and has advised patient SCAT  Will notify her once eligibility determined.    Plan:  CSW to complete SCAT application and turn in asap. Plan f/u with patient in the next 2 weeks.    Eduard Clos, MSW, [place] Worker  [place] [phone]

## 2016-12-11 ENCOUNTER — Other Ambulatory Visit: Payer: Self-pay | Admitting: *Deleted

## 2016-12-11 NOTE — Patient Outreach (Addendum)
[facility]) Care Management   12/11/2016  Erica Morris 09/28/1946 [id number]  Erica Morris is an 71 y.o. female  Subjective:  Patient discussed complaint, of not resting well last night , complaint of itching at rash on legs, out of cream ordered for rash and  unable to afford to get refilled, cost $35.  Patient complaint of episode last night , of feeling like throat was closing up tightness in throat , relief after sitting up and drinking milk, had fried chicken for dinner,  states  she might have been a little anxious. Patient complaint of difficulty swallowing food this morning "just wouldn't down", had to swallow a few times, states she has a hiatal hernia  reports liquids went down easily .  Patient reports decreased episodes of dizziness but still present at times.   Objective:  BP 138/80 (BP Location: Right Arm, Patient Position: Sitting)   Pulse 60   Resp 18   SpO2 98%   Met with patient at outside of daughter's home, in car, per request.  Review of Systems  Constitutional: Negative.   HENT: Negative.   Eyes: Negative.   Respiratory: Negative.   Cardiovascular: Negative.   Gastrointestinal: Negative.   Genitourinary: Negative.   Musculoskeletal: Negative.   Skin: Negative.   Neurological: Positive for dizziness.       "vertigo at times"  Endo/Heme/Allergies: Negative.   Psychiatric/Behavioral: The patient is nervous/anxious.     Physical Exam  Constitutional: She is oriented to person, place, and time. She appears well-developed and well-nourished.  Cardiovascular: Normal rate and normal heart sounds.   Respiratory: Effort normal and breath sounds normal.  GI: Soft.  Neurological: She is alert and oriented to person, place, and time.  Skin: Skin is warm and dry. Rash noted. There is erythema.  Red itchy rash to both legs  Psychiatric: She has a normal mood and affect. Her behavior is normal. Judgment and thought content normal.     Encounter Medications:   Outpatient Encounter Prescriptions as of 12/11/2016  Medication Sig Note  . aspirin EC 81 MG tablet Take 81 mg by mouth every morning.    Marland Kitchen atorvastatin (LIPITOR) 40 MG tablet Take 1 tablet (40 mg total) by mouth daily.   . furosemide (LASIX) 20 MG tablet Take 1 tablet (20 mg total) by mouth daily.   Marland Kitchen gabapentin (NEURONTIN) 300 MG capsule Take 1 capsule (300 mg total) by mouth 3 (three) times daily.   Marland Kitchen levothyroxine (SYNTHROID, LEVOTHROID) 25 MCG tablet Take 1 tablet (25 mcg total) by mouth daily.   . meclizine (ANTIVERT) 25 MG tablet Take 1 tablet (25 mg total) by mouth 3 (three) times daily as needed for dizziness.   . pantoprazole (PROTONIX) 40 MG tablet TAKE 1 TABLET(40 MG) BY MOUTH DAILY   . clotrimazole-betamethasone (LOTRISONE) cream Apply 1 application topically 2 (two) times daily. (Patient not taking: Reported on 12/11/2016) 12/11/2016: Currently out of medication   . ketoconazole (NIZORAL) 2 % cream Apply 1 application topically daily. (Patient not taking: Reported on 12/11/2016)    No facility-administered encounter medications on file as of 12/11/2016.   Patient was recently discharged from hospital and all medications have been reviewed.  Functional Status:   In your present state of health, do you have any difficulty performing the following activities: 11/10/2016 11/07/2016  Hearing? N N  Vision? N N  Difficulty concentrating or making decisions? Y N  Walking or climbing stairs? Y N  Dressing or bathing? N  N  Doing errands, shopping? Y N  Preparing Food and eating ? Y -  Using the Toilet? N -  In the past six months, have you accidently leaked urine? Y -  Do you have problems with loss of bowel control? N -  Managing your Medications? N -  Managing your Finances? N -  Housekeeping or managing your Housekeeping? Y -  Some recent data might be hidden  Patient was recently discharged from hospital and all medications have been  reviewed.  Fall/Depression Screening:    PHQ 2/9 Scores 12/03/2016 11/10/2016 11/05/2016 06/09/2016  PHQ - 2 Score 0 4 2 1   PHQ- 9 Score - 11 - -   Fall Risk  12/03/2016 11/10/2016 11/05/2016 06/09/2016 09/10/2015  Falls in the past year? No Yes Yes Yes Yes  Number falls in past yr: - 2 or more - 1 2 or more  Injury with Fall? - No No Yes Yes  Risk Factor Category  - High Fall Risk - - High Fall Risk  Risk for fall due to : - Impaired balance/gait - Impaired balance/gait Impaired balance/gait  Follow up - Falls prevention discussed - - -   Assessment:    Difficulty swallowing foods -  Reports tolerating swallowing medications without problems, suggested eating softer foods ,  will make PCP office aware   Fall Risk/Vertigo  -  Continues to complain of dizziness at times.takes meclizine daily at least once, denies dizziness during visit today.  Tolerating walking granddaughter from neighborhood school at least 3 days a weeks,1500 feet one way, stops at least once for rest break.Gait steady noted during visit exam. Patient wants to proceed with ordered OP PT once transportation services arranged.Has received her silver sneaker card and verbalized interest in attending.   Red Rash to legs - unable to afford prescribed clotrimazole-betamethasone cream at $35  Patient is hoping to be able to stay in [place] with her daughter for a while. Patient has concerns regarding affording her medications, has one day supply of lasix left, placed call to [facility] needs MD renewal of refill which they will send ,patient states she will not be able to afford the $6.00  Hypertension-  Monitoring her blood pressure at home, reports reading of 148/80 yesterday. Will benefit from education management.  Anxiety - patient reports trying to learn to relax, wants  to change her scheduled visit with [facility] at family practice center, due transportation .   Patient has completed transition of care period, without readmission to  hospital , will benefit from continued complex care management for the next 30 days   Plan:  Will communicate  in basket message to PCP office,Lauren  Ducatte, RN  regarding patient concern regarding swallowing, medication needs   Discuss with [facility] LCSW  regarding possibility of being able to arrangement transportation to scheduled office visit on next week prior to rescheduling.  Will follow up by telephone in the next week to reschedule, next visit.   [facility] CM Care Plan Problem One   Flowsheet Row Most Recent Value  Care Plan Problem One  High Risk for hospital readmission related to recent admission   Role Documenting the Problem One  Care Management [place] for Problem One  Active  [facility]N Long Term Goal (31-90 days)  Patient will not experince a hosptial readmission in the next 60  days  [goal restated ]  THN Long Term Goal Start Date  11/10/16  Interventions for Problem One Long Term Goal  Reviewed importance of taking medications as prescribed,notify MD of new or worsening of symptoms, provided and reviewed [facility] packet  .    [facility] CM Short Term Goal #1 (0-30 days)  Patient will report no falls in the next 30 days   [facility]N CM Short Term Goal #1 Start Date  11/10/16  Interventions for Short Term Goal #1  Provided and reviewed Emmi handout on fall prevention .   THN CM Short Term Goal #2 (0-30 days)  Patient will report attending outpatient physical therapy in the next 30 days   THN CM Short Term Goal #2 Start Date  11/18/16  Interventions for Short Term Goal #2  Verified SCAT transportation application sent in   [facility] CM Short Term Goal #3 (0-30 days)  Patient will continue to monitor blood pressure daily and keep a record in the next 30 days   THN CM Short Term Goal #3 Start Date  12/11/16  Interventions for Short Tern Goal #3  Provided [facility] calendar with instrucitons on how to keep a record,       [name], RN, [place] Management [phone]- Mobile [phone]-  [place]

## 2016-12-12 MED ORDER — FUROSEMIDE 20 MG PO TABS: 20.0000 mg | ORAL_TABLET | Freq: Every day | ORAL | 3 refills | Status: DC

## 2016-12-15 ENCOUNTER — Encounter: Payer: Self-pay | Admitting: Obstetrics and Gynecology

## 2016-12-15 ENCOUNTER — Ambulatory Visit (INDEPENDENT_AMBULATORY_CARE_PROVIDER_SITE_OTHER): Payer: Medicare Other | Admitting: Psychology

## 2016-12-15 ENCOUNTER — Ambulatory Visit (INDEPENDENT_AMBULATORY_CARE_PROVIDER_SITE_OTHER): Payer: Medicare Other | Admitting: Obstetrics and Gynecology

## 2016-12-15 VITALS — BP 134/60 | HR 67 | Temp 98.2°F | Wt 195.0 lb

## 2016-12-15 DIAGNOSIS — F411 Generalized anxiety disorder: Secondary | ICD-10-CM

## 2016-12-15 DIAGNOSIS — I872 Venous insufficiency (chronic) (peripheral): Secondary | ICD-10-CM

## 2016-12-15 NOTE — Patient Instructions (Signed)
Thanks for coming in today,  Erica Morris!   Read over the sheet I gave you on Deep Breathing Relaxation. Try to practice taking these slow deep breaths for a few minutes every day when you are calm. It is important to practice deep breathing when you are already feeling calm so that you can feel comfortable using it. Try to practice deep breathing while you are calm at least twice a day. You may also choose to use this strategy when you are feeling anxious.   I'm glad to hear that you are staying busy - keep taking walks and doing housework as you are able.   Stop by the front desk on your way out and make an appointment with me Sonia Baller) for [name] followup in 2-3 weeks. I am here every Tuesday.   Looking forward to seeing you again!

## 2016-12-15 NOTE — Progress Notes (Signed)
   Subjective:   Patient ID: Erica Morris, female    DOB: Jun 12, 1946, 71 y.o.   MRN: [id number]  Patient presents for Same Day Appointment  Chief Complaint  Patient presents with  . Rash    HPI: # Rash Presenting with worsening rash on bilateral legs. States that rash is severely itchy. She wants something to be done because it is causing her discomfort. She has a long-standing history of leg swelling and skin changes on her legs but has acutely worsened in the last 4 months. Legs are now red and swollen. Hurts to walk. Saw PCP earlier this month and was given ointment but was unable to afford paying for it.   PMH significant for stasis dermatitis, chronic venous insuffiencey   Review of Systems   See HPI for ROS.   History  Smoking Status  . Never Smoker  Smokeless Tobacco  . Never Used    Past medical history, surgical, family, and social history reviewed and updated in the EMR as appropriate.  Objective:  BP 134/60   Pulse 67   Temp 98.2 F (36.8 C) (Oral)   Wt 195 lb (88.5 kg)   BMI 33.47 kg/m  Vitals and nursing note reviewed  Physical Exam  Constitutional: She is well-developed, well-nourished, and in no distress.  Musculoskeletal: She exhibits edema and tenderness.  1+ pitting edema in the feet Skin: Skin is warm.  Thickened erythematous rash over the anterior and posterior legs bilaterally with what appears to be unroofed papules. Excoriations. No drainage noted.  (See photos below)       Assessment & Plan:  1. Venous stasis dermatitis of both lower extremities Acute worsening of chronic condition. Patient has not been using any corticosteroid ointments due to being unable to afford them. Legs currently inflamed and swollen but no signs of superimposed infection. Will wrap her bilateral legs in unna boots. Return to clinic in 7 days for re-evaluation. Patient education given on diagnosis and need to reduce swelling in legs. Follow-up with PCP.   PATIENT  EDUCATION PROVIDED: See AVS   Luiz Blare, DO 12/15/2016, 1:57 PM PGY-3, [place]

## 2016-12-15 NOTE — Progress Notes (Signed)
Reason for follow-up:  Anxiety  Issues discussed:  Patient came in complaining of rash that has been a significant problem for her. [facility] spoke with preceptors and patient was seen for a same-day appointment with Dr. Gerarda Fraction. Patient discussed her current anxiety symptoms, explaining that she has had these problems ever since she had children. Her panic symptoms tend to arise before bed, though also come up at other times, and she feels like her throat is closing, her heart is pounding, and feels shaky. She has thoughts that she could be dying or is going crazy. She states that she takes medication 3 times a day for her "nerves" and that this helps a little. She denies ever missing doses or having problems tolerating the medication.   Patient had transportation difficulties at the end of her appointment today. Patient arrived smoothly and on time. She said she arrived here via SCAT and reports that she was told to call the SCAT number after her appointment for pickup. When we assisted her with dialing SCAT, they had no record of her using SCAT and stated that no application has been filed. I attempted to call both Eduard Clos and [name] with [place] to see if they had information on how to get this patient a ride home, but nobody was available to answer these calls. I spoke with Neoma Laming, who helped me get a taxi voucher for this patient to get home, given that we could not figure out another method of transport.

## 2016-12-15 NOTE — Patient Instructions (Signed)
Legs will be wrapped for 7 days. Please return to clinic in 7 days to remove wrap and have legs reexamined. No need for topical medication at this time since legs will be wrapped   Stasis Dermatitis Stasis dermatitis is a long-term (chronic) skin condition that happens when veins can no longer pump blood back to the heart (poor circulation). This condition causes a red or brown scaly rash or sores (ulcers) from the pooling of blood (stasis). This condition usually affects the lower legs. It may affect one leg or both legs. Without treatment, severe stasis dermatitis can lead to other skin conditions and infections. What are the causes? This condition is caused by poor circulation. What increases the risk? This condition is more likely to develop in people who:  Are not very active.  Stand for long periods of time.  Have veins that have become enlarged and twisted (varicose veins).  Have leg veins that are not strong enough to send blood back to the heart (venous insufficiency).  Have had a blood clot.  Have been pregnant many times.  Have had vein surgery.  Are obese.  Have heart or kidney failure.  Are 70 years of age or older. What are the signs or symptoms? Common early symptoms of this condition include:  Swelling in your ankle or leg. This might get better overnight but be worse again in the day.  Skin that looks thin on your ankle and leg.  Owens Shark marks that develop slowly.  Skin that is easily irritated or cracked.  Red, swollen skin.  An achy or heavy feeling after you walk or stand for long periods of time.  Pain. Later and more severe symptoms of this condition include:  Skin that looks shiny.  Small, open sores (ulcers). These are often red or purple.  Dry, cracking skin.  Skin that feels hard.  Severe itching.  A change in the shape or color of your lower legs.  Severe pain.  Difficulty walking. How is this diagnosed? Your health care  provider may suspect this condition from your symptoms and medical history. Your health care provider will also do a physical exam. You may need to see a health care provider who specializes in skin diseases (dermatologist). You may also have tests to confirm the diagnosis, including:  Blood tests.  Imaging studies to check blood flow (Doppler ultrasound).  Allergy tests. How is this treated? Treatment for this condition may include medicine, such as:  Corticosteroid creams and ointments.  Non-corticosteroid medicines applied to the skin (topical).  Medicine to reduce swelling in the legs (diuretics).  Antibiotics.  Medicine to relieve itching (antihistamines). You may also have to wear:  Compression stockings or an elastic wrap to improve circulation.  A bandage (dressing).  A wrap that contains zinc and gelatin (Unna boot). Follow these instructions at home: [place] your skin as told by your health care provider. Do not use moisturizers with fragrance. This can irritate your skin.  Apply cool compresses to the affected areas.  Do not scratch your skin.  Do not rub your skin dry after a bath or shower. Gently pat your skin dry.  Do not use scented soaps, detergents, or perfumes. Medicines  Take or use over-the-counter and prescription medicines only as told by your health care provider.  If you were prescribed an antibiotic medicine, take or use it as told by your health care provider. Do not stop taking or using the antibiotic even if your condition starts  to improve. Lifestyle  Do not stand or sit in one position for long periods of time.  Do not cross your legs when you sit.  Raise (elevate) your legs above the level of your heart when you are sitting or lying down.  Walk as told by your health care provider. Walking increases blood flow.  Wear comfortable, loose-fitting clothing. Circulation in your legs will be worse if you wear tight pants,  belts, and waistbands. General instructions  Change and remove any dressing as told by your health care provider, if this applies.  Wear compression stockings as told by your health care provider, if this applies. These stockings help to prevent blood clots and reduce swelling in your legs.  Wear the [employer] as told by your health care provider, if this applies.  Keep all follow-up visits as told by your health care provider. This is important. Contact a health care provider if:  Your condition does not improve with treatment.  Your condition gets worse.  You have signs of infection in the affected area. Watch for:  Swelling.  Tenderness.  Redness.  Soreness.  Warmth.  You have a fever. Get help right away if:  You notice red streaks coming from the affected area.  Your bone or joint underneath the affected area becomes painful after the skin has healed.  The affected area turns darker.  You feel a deep pain in your leg or groin.  You are short of breath. This information is not intended to replace advice given to you by your health care provider. Make sure you discuss any questions you have with your health care provider. Document Released: 02/25/2006 Document Revised: 07/14/2016 Document Reviewed: 04/03/2015 Elsevier Interactive Patient Education  2017 [employer].

## 2016-12-15 NOTE — Assessment & Plan Note (Signed)
Assessment/Plan/Recommendations: Patient's biggest complaint today was her rash, which has been keeping her awake and causing her distress. She feels that getting this under control will help her anxiety significantly and plans to followup with Dr. Andria Frames next week. Today, patient discussed her anxiety symptoms and [facility] taught deep breathing. Patient demonstrated her use of this strategy by practicing during her appointment. I asked her to practice a few times a day while she is relaxed to build mastery of this strategy. I sent her home with a handout to review about deep breathing and patient agreed to do this. She will come back for followup with [facility] in 3 weeks.

## 2016-12-16 ENCOUNTER — Other Ambulatory Visit: Payer: Self-pay | Admitting: *Deleted

## 2016-12-16 NOTE — Patient Outreach (Signed)
[facility]) Care Management  12/16/2016  Erica Morris 07/10/1946 [id number]  Incoming call from patient to discuss her recent visit to PCP office regarding rash on her legs.  Patient discussed wrap dressings that she has on her legs now. Patient discussed her visit with counselor and how she has been using the deep breathing exercises to help her during time of feeling anxious.  [name] discussed difficulty she had in contacting transportation that brought her to the visit,,she did not have contact number of agency,  she thought it was [employer]. Explained to patient [employer] application was just recently sent and had not been processed yet, transportation to office visit was arranged through another transportation service by the [facility] office. Assured patient that I would discuss her recent transportation issue with Eduard Clos, [facility] social worker.  Patient discussed she had another appointment scheduled next week at PCP office  and wanted to know if [employer] would be available then, because she does not have a ride, I asked her if her son, or daughter would be available and she states no.   Plan Will discuss transportation concerns with Eduard Clos and follow up with patient on next 2 days for follow up.  [name], RN, [place] Care Management [phone]- Mobile [phone]- [name]

## 2016-12-18 ENCOUNTER — Other Ambulatory Visit: Payer: Self-pay | Admitting: *Deleted

## 2016-12-18 ENCOUNTER — Telehealth: Payer: Self-pay | Admitting: Family Medicine

## 2016-12-18 NOTE — Telephone Encounter (Signed)
Pt no longer living with Son/Melissa. I tried to contact pt on [phone] (Granddaughters phone) and LM. This is the phone pt is using for the time being. If pt calls, please find out if she tried the medication and it didn't work or if she didn't pick it up because of money.  Ottis Stain, CMA

## 2016-12-18 NOTE — Telephone Encounter (Signed)
Pt called back and said that she did not pick up the medication and has no money to do so. Is there anything we can do? Please call the patient to discuss, jw

## 2016-12-18 NOTE — Telephone Encounter (Signed)
Pt called and would like to speak to the doctor about her rash. It is not getting better and it is spreading. I am unsure if she picked up the medication and it didn't work or she didn't have the money to pick up. She can not get here and has no money for a different prescription. Please call to discuss. jw

## 2016-12-18 NOTE — Patient Outreach (Signed)
[facility]) Care Management  12/18/2016  AYEN DIGGS 12-01-1945 [id number]   Follow up call  Placed call to patient, no answer able to leave hipaa compliant voice message requesting return call.  Plan  Will place return call in the next week Will will communicate  patient concerns related affording Gideon, Pharmacist, consult in place.  Will in basket Eduard Clos regarding patient concern related to transportation concerns and reported to have  office visit on next week to reevaluate legs, discussed with patient by phone 2 days ago. .Placed call to family practice center no appointment on schedule yet.   [name], RN, [place] Management [phone]- Mobile [phone]- Toll Free Main Office

## 2016-12-18 NOTE — Telephone Encounter (Signed)
Pt called back and was told about the OTC hydrocortisone. She said that this will tot work and it is spreading everywhere. She wants something done. I tried to get her to come in today but she did not have a ride. Please call to discuss her other options. jw

## 2016-12-18 NOTE — Telephone Encounter (Signed)
LVM for pt to call back to inform them of below. Zimmerman Rumple, [name], CMA  

## 2016-12-18 NOTE — Telephone Encounter (Signed)
I would recommend over the counter hydrocortisone.

## 2016-12-18 NOTE — Telephone Encounter (Signed)
Pt says she has tried OTC hydrocortizone cream and it isnt working, she has tried everything OTC she can find and nothing is working. She has this on her elbows, hair and vagina.  That is why she is hurting so mcuh. She wants dr Andria Frames to call her back

## 2016-12-20 ENCOUNTER — Other Ambulatory Visit: Payer: Self-pay | Admitting: Internal Medicine

## 2016-12-20 ENCOUNTER — Telehealth: Payer: Self-pay | Admitting: Student

## 2016-12-20 DIAGNOSIS — L299 Pruritus, unspecified: Secondary | ICD-10-CM

## 2016-12-20 DIAGNOSIS — E039 Hypothyroidism, unspecified: Secondary | ICD-10-CM

## 2016-12-20 MED ORDER — HYDROXYZINE HCL 25 MG PO TABS: 25.0000 mg | ORAL_TABLET | Freq: Three times a day (TID) | ORAL | 0 refills | Status: DC | PRN

## 2016-12-20 MED ORDER — FLUCONAZOLE 150 MG PO TABS: 150.0000 mg | ORAL_TABLET | Freq: Once | ORAL | 0 refills | Status: AC

## 2016-12-20 NOTE — Telephone Encounter (Signed)
Called and talked to patient's daughter who reports itching over her abdomen and private areas. She also reports dysuria but denies fever, chills or increased frequency of urination. She denies vaginal discharge. I sent prescription for Diflucan for possible vaginal candidiasis and hydroxyzine for pruritus. I recommended calling the clinic if no improvement with these over the next couple of days.

## 2016-12-21 ENCOUNTER — Other Ambulatory Visit: Payer: Self-pay | Admitting: *Deleted

## 2016-12-21 NOTE — Telephone Encounter (Signed)
Tried calling both lines and no answer.  One line had a voice mail from a child "Orie Fisherman"  No message left.  If patient calls back, will need to be seen.

## 2016-12-22 ENCOUNTER — Encounter: Payer: Self-pay | Admitting: Family Medicine

## 2016-12-22 NOTE — Patient Outreach (Signed)
[facility]) Care Management  12/22/2016  Erica Morris 1946/10/17 [id number]   CSW spoke with patient by phone today. She is still residing with her daughter in [place]. CSW discussed [employer], Medicaid and her upcoming PCP appointment on Thursday; her daughter Olivia Mackie states she plans to take her. CSW requested to meet them there and provide some resources since daughter does not want [place] to visit in her home.  Both patient and her daughter agree to this plan.   Eduard Clos, MSW, [place] Worker  [place] [phone]

## 2016-12-24 ENCOUNTER — Encounter: Payer: Self-pay | Admitting: Licensed Clinical Social Worker

## 2016-12-24 ENCOUNTER — Other Ambulatory Visit: Payer: Self-pay | Admitting: *Deleted

## 2016-12-24 ENCOUNTER — Telehealth: Payer: Self-pay | Admitting: Family Medicine

## 2016-12-24 ENCOUNTER — Emergency Department (HOSPITAL_COMMUNITY): Payer: Medicare Other

## 2016-12-24 ENCOUNTER — Emergency Department (HOSPITAL_COMMUNITY)
Admission: EM | Admit: 2016-12-24 | Discharge: 2016-12-24 | Disposition: A | Discharge status: Home / Self Care | Payer: Medicare Other | Attending: Emergency Medicine | Admitting: Emergency Medicine

## 2016-12-24 ENCOUNTER — Other Ambulatory Visit: Payer: Self-pay

## 2016-12-24 ENCOUNTER — Ambulatory Visit: Payer: Medicare Other | Admitting: Family Medicine

## 2016-12-24 ENCOUNTER — Encounter (HOSPITAL_COMMUNITY): Payer: Self-pay | Admitting: Emergency Medicine

## 2016-12-24 DIAGNOSIS — R0602 Shortness of breath: Secondary | ICD-10-CM

## 2016-12-24 DIAGNOSIS — E039 Hypothyroidism, unspecified: Secondary | ICD-10-CM | POA: Diagnosis not present

## 2016-12-24 DIAGNOSIS — I10 Essential (primary) hypertension: Secondary | ICD-10-CM | POA: Insufficient documentation

## 2016-12-24 DIAGNOSIS — Z8673 Personal history of transient ischemic attack (TIA), and cerebral infarction without residual deficits: Secondary | ICD-10-CM | POA: Diagnosis not present

## 2016-12-24 DIAGNOSIS — J209 Acute bronchitis, unspecified: Secondary | ICD-10-CM | POA: Insufficient documentation

## 2016-12-24 DIAGNOSIS — Z79899 Other long term (current) drug therapy: Secondary | ICD-10-CM | POA: Insufficient documentation

## 2016-12-24 DIAGNOSIS — R069 Unspecified abnormalities of breathing: Secondary | ICD-10-CM | POA: Diagnosis not present

## 2016-12-24 DIAGNOSIS — Z7982 Long term (current) use of aspirin: Secondary | ICD-10-CM | POA: Insufficient documentation

## 2016-12-24 DIAGNOSIS — R05 Cough: Secondary | ICD-10-CM | POA: Diagnosis not present

## 2016-12-24 LAB — CBC WITH DIFFERENTIAL/PLATELET
Basophils Absolute: 0 10*3/uL (ref 0.0–0.1)
Basophils Relative: 0 %
Eosinophils Absolute: 0.5 10*3/uL (ref 0.0–0.7)
Eosinophils Relative: 6 %
HCT: 36.5 % (ref 36.0–46.0)
Hemoglobin: 11.3 g/dL — ABNORMAL LOW (ref 12.0–15.0)
Lymphocytes Relative: 19 %
Lymphs Abs: 1.4 10*3/uL (ref 0.7–4.0)
MCH: 26.5 pg (ref 26.0–34.0)
MCHC: 31 g/dL (ref 30.0–36.0)
MCV: 85.7 fL (ref 78.0–100.0)
Monocytes Absolute: 0.5 10*3/uL (ref 0.1–1.0)
Monocytes Relative: 6 %
Neutro Abs: 5 10*3/uL (ref 1.7–7.7)
Neutrophils Relative %: 69 %
Platelets: 223 10*3/uL (ref 150–400)
RBC: 4.26 MIL/uL (ref 3.87–5.11)
RDW: 14.5 % (ref 11.5–15.5)
WBC: 7.3 10*3/uL (ref 4.0–10.5)

## 2016-12-24 LAB — BASIC METABOLIC PANEL
Anion gap: 7 (ref 5–15)
BUN: 11 mg/dL (ref 6–20)
CO2: 26 mmol/L (ref 22–32)
Calcium: 8.5 mg/dL — ABNORMAL LOW (ref 8.9–10.3)
Chloride: 109 mmol/L (ref 101–111)
Creatinine, Ser: 0.87 mg/dL (ref 0.44–1.00)
GFR calc Af Amer: >60 mL/min (ref >60)
GFR calc non Af Amer: >60 mL/min (ref >60)
Glucose, Bld: 113 mg/dL — ABNORMAL HIGH (ref 65–99)
Potassium: 3.8 mmol/L (ref 3.5–5.1)
Sodium: 142 mmol/L (ref 135–145)

## 2016-12-24 LAB — I-STAT TROPONIN, ED: Troponin i, poc: 0 ng/mL (ref 0.00–0.08)

## 2016-12-24 MED ORDER — ALBUTEROL SULFATE HFA 108 (90 BASE) MCG/ACT IN AERS
2.0000 | INHALATION_SPRAY | RESPIRATORY_TRACT | Status: DC | PRN
  Administered 2016-12-24: 2 via RESPIRATORY_TRACT
  Filled 2016-12-24: qty 6.7

## 2016-12-24 MED ORDER — ACETAMINOPHEN 500 MG PO TABS
1000.0000 mg | ORAL_TABLET | Freq: Once | ORAL | Status: AC
  Administered 2016-12-24: 1000 mg via ORAL
  Filled 2016-12-24: qty 2

## 2016-12-24 MED ORDER — AMOXICILLIN 500 MG PO CAPS: 500.0000 mg | ORAL_CAPSULE | Freq: Three times a day (TID) | ORAL | 0 refills | Status: DC

## 2016-12-24 MED ORDER — PREDNISONE 10 MG PO TABS: 20.0000 mg | ORAL_TABLET | Freq: Two times a day (BID) | ORAL | 0 refills | Status: DC

## 2016-12-24 NOTE — Progress Notes (Signed)
LCSW received phone call from ED social worker Caryl Pina for coordination of care. Patient had an appointment with PCP today, however went to the ED for SOB. LCSW coordinated scheduling patient's follow up appointment with PCP 12/30/16 at 10:45 and obtaining an earlier appointment with [place], Sonia Baller 12/29/16 at 10:00. ED social worker will provide patient with appointment dates and time.    Casimer Lanius, LCSW Licensed Clinical Social Worker [place]   [phone] 10:39 AM

## 2016-12-24 NOTE — Patient Outreach (Signed)
[facility]) Care Management  12/24/2016  Erica Morris 12-17-1945 [id number]   CSW received call from daughter, Olivia Mackie, stating her mom was upset and SOB and decided to walk to the [employer]- she now is in the ED. CSW contacted [facility] RNCM to update as well as the [facility] ED CSW. Will await ED assessment for further needs/involvement.   Eduard Clos, MSW, [place] Worker  [place] [phone]

## 2016-12-24 NOTE — Discharge Instructions (Signed)
Prednisone and amoxicillin as prescribed.  Albuterol MDI: 2 puffs every 4 hours as needed for wheezing or shortness of breath.  Return to the emergency department if you develop high fevers, severe chest pain, worsening breathing, or other new and concerning symptoms.

## 2016-12-24 NOTE — Progress Notes (Signed)
CSW received phone call from Patient's [facility] Social Worker, Eduard Clos, per Marcie Bal, Patient may benefit from a psych consult due to underlying anxiety. CSW has staffed with MD. Patient is being followed by Clent Jacks, [place] Counselor at [place]. CSW has contacted [facility] CSW to update her on Patient's presence in the ED and schedule a PCP follow up appointment as well as a follow up appointment with Clent Jacks. Patient already has an appointment scheduled for February 6th with Ms. Robb at 1:30PM. CSW able to get Patient an earlier appointment with Ms. Thamas Jaegers on next Tuesday 12/29/16 at 10:00 AM and with her PCP Dr. Andria Frames on next Wednesday 12/30/16 at 10:45 AM. Information placed on Patient's AVS and CSW has informed Patient of the upcoming appointments. CSW continues to follow for any additional needs.    Lorrine Kin, MSW, LCSW [facility] ED/63M Clinical Social Worker [phone]

## 2016-12-24 NOTE — ED Notes (Signed)
Pt wheeled to restroom, pt ambulated and had no complaints.

## 2016-12-24 NOTE — ED Triage Notes (Signed)
Patient with [facility] after calling for shortness of breath.  Patient states she was walking her daughter to school and while walking back she started having trouble breathing stating "I felt like I had to ask God for every next breath."  Patient is alert and oriented and in no apparent distress at this time.  O2 saturation 100% on room air.

## 2016-12-24 NOTE — Telephone Encounter (Signed)
Erica Morris is upset because she was told by her daughter Erica Morris that someone from social services told Erica Morris she was mentally handicapped.  Erica Morris wants to talk to ever who told her daughter this. Also Erica Morris has put Erica Morris out of the house and she is now living with Erica Morris,

## 2016-12-24 NOTE — ED Provider Notes (Signed)
[facility] DEPT Provider Note   CSN: [id number] Arrival date & time: 12/24/16  [id number]     History   Chief Complaint Chief Complaint  Patient presents with  . Shortness of Breath    HPI MELE OKLAND is a 71 y.o. female.  Patient is a 71 year old female with past medical history of hypertension, GERD, and anxiety. She presents for evaluation of shortness of breath. She tells me she has been "sick for a month" with cough, chest congestion, and intermittent shortness of breath when she ambulates. She denies any history of cardiac or pulmonary disease.   The history is provided by the patient.  Shortness of Breath  This is a new problem. The average episode lasts 1 month. Associated symptoms include cough, sputum production and rash. Pertinent negatives include no fever, no leg pain and no leg swelling. It is unknown what precipitated the problem. She has tried nothing for the symptoms.    Past Medical History:  Diagnosis Date  . ABDOMINAL PAIN, RECURRENT 03/29/2008  . ANXIETY 10/07/2007  . Candidiasis of mouth 05/30/2010  . GERD 07/25/2007  . Hernia, hiatal   . HYPERTENSION 07/25/2007  . Stroke [facility])     Patient Active Problem List   Diagnosis Date Noted  . Urinary tract infection 12/03/2016  . Hypothyroidism 11/17/2016  . Benign paroxysmal positional vertigo   . Episode of recurrent major depressive disorder ([place])   . History of CVA (cerebrovascular accident)   . Syncope and collapse 11/07/2016  . Pure hypercholesterolemia 11/06/2016  . Chronic venous insufficiency 11/05/2016  . Peripheral neuropathy ([place]) 11/05/2016  . Cerebrovascular disease 06/09/2016  . Vertigo 06/09/2016  . Hypokalemia 02/20/2015  . History of colonic polyps 06/04/2014  . Depression 03/29/2014  . Chronic constipation 03/29/2014  . ABDOMINAL PAIN, RECURRENT 03/29/2008  . Anxiety state 10/07/2007  . Essential hypertension 07/25/2007  . GERD 07/25/2007    Past Surgical History:  Procedure  Laterality Date  . ABDOMINAL HYSTERECTOMY    . BREAST SURGERY     bx  . KNEE ARTHROSCOPY      OB History    No data available       Home Medications    Prior to Admission medications   Medication Sig Start Date End Date Taking? Authorizing Provider  aspirin EC 81 MG tablet Take 81 mg by mouth every morning.     Historical Provider, MD  atorvastatin (LIPITOR) 40 MG tablet Take 1 tablet (40 mg total) by mouth daily. 11/06/16   Zenia Resides, MD  furosemide (LASIX) 20 MG tablet Take 1 tablet (20 mg total) by mouth daily. 12/12/16   Zenia Resides, MD  gabapentin (NEURONTIN) 300 MG capsule Take 1 capsule (300 mg total) by mouth 3 (three) times daily. 12/03/16   Zenia Resides, MD  hydrOXYzine (ATARAX/VISTARIL) 25 MG tablet Take 1 tablet (25 mg total) by mouth 3 (three) times daily as needed. 12/20/16   Mercy Riding, MD  ketoconazole (NIZORAL) 2 % cream Apply 1 application topically daily. Patient not taking: Reported on 12/11/2016 10/29/16   Archie Patten, MD  levothyroxine (SYNTHROID, LEVOTHROID) 25 MCG tablet TAKE 1 TABLET BY MOUTH EVERY DAY (OKAY TO CHANGE TO MYLAN BRAND PER DR. KWIATKOWSKI) 12/21/16   Marletta Lor, MD  meclizine (ANTIVERT) 25 MG tablet Take 1 tablet (25 mg total) by mouth 3 (three) times daily as needed for dizziness. 11/17/16   Elsia Nigel Sloop, DO  pantoprazole (PROTONIX) 40 MG tablet TAKE 1 TABLET(40 MG)  BY MOUTH DAILY 06/05/16   Marletta Lor, MD    Family History Family History  Problem Relation Age of Onset  . Kidney disease Neg Hx   . Stroke Neg Hx   . Diabetes Neg Hx     Social History Social History  Substance Use Topics  . Smoking status: Never Smoker  . Smokeless tobacco: Never Used  . Alcohol use Yes     Comment: occasional     Allergies   Lisinopril; Codeine phosphate; and Morphine and related   Review of Systems Review of Systems  Constitutional: Negative for fever.  Respiratory: Positive for cough, sputum production and  shortness of breath.   Cardiovascular: Negative for leg swelling.  Skin: Positive for rash.  All other systems reviewed and are negative.    Physical Exam Updated Vital Signs BP 161/76 (BP Location: Right Arm)   Pulse (!) 58   Temp 99 F (37.2 C) (Oral)   Resp 16   SpO2 99%   Physical Exam  Constitutional: She is oriented to person, place, and time. She appears well-developed and well-nourished. No distress.  HENT:  Head: Normocephalic and atraumatic.  Neck: Normal range of motion. Neck supple.  Cardiovascular: Normal rate and regular rhythm.  Exam reveals no gallop and no friction rub.   No murmur heard. Pulmonary/Chest: Effort normal and breath sounds normal. No respiratory distress. She has no wheezes.  Abdominal: Soft. Bowel sounds are normal. She exhibits no distension. There is no tenderness.  Musculoskeletal: Normal range of motion. She exhibits no edema.  Neurological: She is alert and oriented to person, place, and time.  Skin: Skin is warm and dry. She is not diaphoretic.  Nursing note and vitals reviewed.    ED Treatments / Results  Labs (all labs ordered are listed, but only abnormal results are displayed) Labs Reviewed  BASIC METABOLIC PANEL  CBC WITH DIFFERENTIAL/PLATELET  Randolm Idol, ED    EKG  EKG Interpretation  Date/Time:  Thursday December 24 2016 08:32:11 EST Ventricular Rate:  61 PR Interval:    QRS Duration: 97 QT Interval:  438 QTC Calculation: 442 R Axis:   57 Text Interpretation:  Sinus rhythm Nonspecific T wave abnormality Confirmed by DELO  MD, DOUGLAS ([id number]) on 12/24/2016 9:21:32 AM Also confirmed by Stark Jock  MD, DOUGLAS ([id number]), editor [place]  CCT, BEVERLY ([id number])  on 12/24/2016 10:55:46 AM       Radiology No results found.  Procedures Procedures (including critical care time)  Medications Ordered in ED Medications - No data to display   Initial Impression / Assessment and Plan / ED Course  I have reviewed the triage  vital signs and the nursing notes.  Pertinent labs & imaging results that were available during my care of the patient were reviewed by me and considered in my medical decision making (see chart for details).     Patient is a 71 year old female who presents with a one-month history of chest congestion, feeling short of breath, and dizziness. Her workup today reveals no obvious abnormalities. Her EKG is unchanged and troponin is negative. I highly doubt a cardiac etiology. Her oxygen saturations are 100% and heart rate is 60. I've considered, but highly doubt pulmonary embolism. She does report coughing and feeling congested. Although her chest x-ray is negative, she may have a bronchitis contributing to this. She will be discharged with prednisone and antibiotic, and an inhaler.  Final Clinical Impressions(s) / ED Diagnoses   Final diagnoses:  None  New Prescriptions New Prescriptions   No medications on file     Veryl Speak, MD 12/24/16 1106

## 2016-12-25 ENCOUNTER — Other Ambulatory Visit: Payer: Self-pay | Admitting: *Deleted

## 2016-12-25 ENCOUNTER — Other Ambulatory Visit: Payer: Self-pay | Admitting: Pharmacist

## 2016-12-25 NOTE — Patient Outreach (Signed)
Erica Morris was referred to pharmacy for medication assistance. Left a HIPAA compliant message on the patient's voicemail. If have not heard from patient by next week, will give her another call at that time.  Harlow Asa, PharmD, [place] Management [phone]

## 2016-12-25 NOTE — Patient Outreach (Signed)
[facility]) Care Management  12/25/2016  CATARINA TIGUE 24-Jan-1946 [id number]   Placed follow up call to Mrs.Ezekiel previous contact number she provided, [phone], no answer unable to leave a message at that number.  Placed call to [place] listed on [facility] as emergency contact, person answering states wrong number.   Plan Will place return call in the next week.   [name], RN, [place] Management [phone]- Mobile [phone]- Toll Free Main Office

## 2016-12-27 ENCOUNTER — Telehealth: Payer: Self-pay | Admitting: Family Medicine

## 2016-12-27 NOTE — Telephone Encounter (Signed)
Received call from patient's daughter on after hours line. Stated that the patient was having some shortness of breath and cough. Patient took an albuterol treatment which improved her shortness of breath, but is still having a lot of cough. Patient's daughter in law asked if we could prescribe something over the phone. Told her that I would be unable to prescribe anything over the phone, but if she wanted to take something for cough honey or an over the counter cough medication such as robitussin would be appropriate. Told her that if patient continued to have shortness of breath she should be evaluated at urgent care or in the ED. Patient's daughter in law voiced understanding and had no further questions.  Algis Greenhouse. Jerline Pain, [place] Medicine Resident PGY-3 12/27/2016 9:46 PM

## 2016-12-28 ENCOUNTER — Other Ambulatory Visit: Payer: Self-pay | Admitting: Pharmacist

## 2016-12-28 ENCOUNTER — Other Ambulatory Visit: Payer: Self-pay | Admitting: *Deleted

## 2016-12-28 ENCOUNTER — Ambulatory Visit: Payer: Self-pay | Admitting: Pharmacist

## 2016-12-28 NOTE — Patient Outreach (Signed)
Erica Morris was referred to pharmacy for medication assistance. Outreach attempt #2. Left a HIPAA compliant message on the patient's voicemail. If have not heard from patient by next week, will give her another call at that time.  Harlow Asa, PharmD, [place] Management [phone]

## 2016-12-29 ENCOUNTER — Telehealth: Payer: Self-pay

## 2016-12-29 ENCOUNTER — Ambulatory Visit: Payer: Medicare Other

## 2016-12-29 NOTE — Telephone Encounter (Signed)
Called patient at [phone], daughter Erica Morris answered and said patient can be reached at new number: [phone].   Called [phone], patient's daughter-in-law Erica Morris answered and passed phone to Erica Morris. Erica Morris expressed frustration that somebody had called her daughter Erica Morris and told her that she was "mentally unstable" and that Erica Morris has since asked her to leave the house. Erica Morris is staying with Erica Morris and her family now. Erica Morris asked if I had called Erica Morris and said this, and I explained that I had not spoken to [place] prior to today and have not spoken to Erica Morris about her personal information. I told Takima that I only spoke to Erica Morris today to get new contact information and nothing else was discussed. Erica Morris stated that she thinks Erica Morris could have called Erica Morris. I assured patient that I spoke with Erica Morris this morning and that Erica Morris has had no contact with Erica Morris or anyone else related to patient. Erica Morris accepted this but expressed continued frustration that somebody had called. Erica Morris explained that she has been very stressed lately and feeling like she is going crazy. She stated that Erica Morris has been trying to keep her calm by talking to her and that she has been going for some walks, but feels cooped up. I encouraged her to go on walks when possible and to stay busy when she can. She wanted to make a followup appointment with me, but is seeing Erica Morris next Wednesday when I am not available. Erica Morris asked me to talk to [facility] to facilitate an appointment being scheduled. She put Erica Morris on the phone and Erica Morris stated that she could bring her on 2/13 at 11am. I advised Erica Morris that this is on my schedule.

## 2016-12-30 ENCOUNTER — Ambulatory Visit: Payer: Medicare Other | Admitting: Family Medicine

## 2016-12-31 ENCOUNTER — Other Ambulatory Visit: Payer: Self-pay | Admitting: *Deleted

## 2016-12-31 NOTE — Patient Outreach (Signed)
[facility]) Care Management  12/31/2016  Erica Morris 03-Nov-1946 [id number]   CSW attempted to reach patient today by phone. Her daughter in law,Melissa, reports she has gone to the store. She will ask patient to return my call upon her return to home.  Will call back tomorrow if no return call received today.   Eduard Clos, MSW, [place] Worker  [place] [phone]

## 2016-12-31 NOTE — Patient Outreach (Signed)
[facility]) Care Management  12/31/2016  Erica Morris [birth year] [id number]   Placed follow up telephone call to [name] at number [phone], able to leave a hipaa compliant message requesting a return call.  Plan  Await return call, if no response will return call in the next 2 weeks.  [name], RN, [place] Management [phone]- Mobile [phone]- Toll Free Main Office

## 2017-01-01 ENCOUNTER — Ambulatory Visit: Payer: Self-pay | Admitting: Pharmacist

## 2017-01-01 ENCOUNTER — Other Ambulatory Visit: Payer: Self-pay | Admitting: Student

## 2017-01-01 ENCOUNTER — Other Ambulatory Visit: Payer: Self-pay | Admitting: *Deleted

## 2017-01-01 DIAGNOSIS — L299 Pruritus, unspecified: Secondary | ICD-10-CM

## 2017-01-01 NOTE — Patient Outreach (Signed)
[facility]) Care Management  01/01/2017  TEMPA WITTKOP 01/08/1946 [id number]   Incoming call from Mrs.Dotts , patient called to inform me that she is back staying with her daughter in [place], and plans to stay there for a while. Patient provided me with her new telephone number, and to discuss if she had been approved for SCAT transportation because she has a doctor appointment on next week.  Plan Will update contact number in system Will collaborate with Eduard Clos regarding patient concern. Placed call to SCAT unable to speak with represenative able to leave a hipaa compliant message to follow up on transportation.  Will follow up with patient in the next 2 weeks by telephone.   [name], RN, [place] Management [phone]- Mobile [phone]- Toll Free Main Office

## 2017-01-05 ENCOUNTER — Ambulatory Visit: Payer: Medicare Other

## 2017-01-05 ENCOUNTER — Encounter: Payer: Self-pay | Admitting: Licensed Clinical Social Worker

## 2017-01-05 NOTE — Progress Notes (Addendum)
Return call to [place] Worker Marcie Bal [phone] requesting coordination of care with patient during next office visit 01/06/17.  Patient will not allow [facility] to do a home visit.  See note 12/29/16 from [place] [facility] ref: Patient upset and believes this LCSW called her daughter and shared personal information.  LCSW and [facility] Sonia Baller agree that it may not be in patient's best interest to meet with LCSW as this may cause her to become upset again.  Patient now has her own cell phone [phone] and has a pending SCAT application.   Plan: 1. [facility] will assist patient with transportation to appointment 01/06/17 and back home via "[employer]" [phone].   2. [facility] has an appointment with patient next week and will discuss needs and community resources ( ie Medicaid, food stamps etc).  Casimer Lanius, LCSW Licensed Clinical Social Worker [place]   [phone] 10:31 AM

## 2017-01-06 ENCOUNTER — Other Ambulatory Visit: Payer: Self-pay | Admitting: Pharmacist

## 2017-01-06 ENCOUNTER — Ambulatory Visit (INDEPENDENT_AMBULATORY_CARE_PROVIDER_SITE_OTHER): Payer: Medicare Other | Admitting: Family Medicine

## 2017-01-06 ENCOUNTER — Encounter: Payer: Self-pay | Admitting: Family Medicine

## 2017-01-06 DIAGNOSIS — I951 Orthostatic hypotension: Secondary | ICD-10-CM

## 2017-01-06 DIAGNOSIS — H8110 Benign paroxysmal vertigo, unspecified ear: Secondary | ICD-10-CM | POA: Diagnosis not present

## 2017-01-06 DIAGNOSIS — I872 Venous insufficiency (chronic) (peripheral): Secondary | ICD-10-CM | POA: Diagnosis not present

## 2017-01-06 MED ORDER — TRIAMCINOLONE ACETONIDE 0.1 % EX OINT: 1.0000 "application " | TOPICAL_OINTMENT | Freq: Two times a day (BID) | CUTANEOUS | 1 refills | Status: DC

## 2017-01-06 NOTE — Patient Outreach (Signed)
Called and spoke with patient. HIPAA identifiers verified and verbal consent received.  Erica Morris reports that she would like assistance with completing the extra help application. Discuss with patient the pieces of information that are needed for this application. Patient reports that she will call to get the information tomorrow. Schedule an appointment with patient to complete this application over the phone on Friday, 01/08/17 at 1 pm.  Harlow Asa, PharmD, [place] Management [phone]

## 2017-01-06 NOTE — Patient Instructions (Addendum)
Stop taking the meclizine.  I updated your medicine list and these are the only ones I want you to take. I sent in a new prescription for an ointment for your legs.

## 2017-01-06 NOTE — Patient Outreach (Signed)
Erica Morris was referred to pharmacy for medication assistance. Call and speak with patient. Patient reports that she is currently at her doctor's office for an appointment and asks that I try her back later today.   As requested, will call patient back this afternoon.  Harlow Asa, PharmD, [place] Management [phone]

## 2017-01-07 ENCOUNTER — Telehealth: Payer: Self-pay | Admitting: Family Medicine

## 2017-01-07 NOTE — Telephone Encounter (Signed)
Pt states she needs [place] to arrange a ride to her appointment next week or she will need to cancel it. Pt states we arranged a ride for her recently. I will send this note to the team and Casimer Lanius. Please advise. ep

## 2017-01-07 NOTE — Assessment & Plan Note (Signed)
Vertigo seems to have resolved.

## 2017-01-07 NOTE — Assessment & Plan Note (Signed)
Add generic triamcinolone oint.  Once again, emphasized no scratching.

## 2017-01-07 NOTE — Progress Notes (Signed)
   Subjective:    Patient ID: Erica Morris, female    DOB: [birth year], 71 y.o.   MRN: [id number]  HPI multiple complaints.   1. Headache x 1 week.  This is in keeping with her typical headache pattern.  No nausea, vomiting, photophobia or phonophobia. 2. Leg rash continues to come and go.  Could not afford many ointments.  Continues to scratch at legs.  3. Dry mouth.  Noted to be on both meclizine for vertigo and hydroxyzine for leg pruritis and anxiety.  Vertigo has resolved.  Does have some "dizziness" which sounds like orthostatsis.     Review of Systems     Objective:   Physical Exam Tenderness along occipital ridge and neck extensors. Legs moderate excoriations both legs.  None infected. Neuro CN 2-12 intact, motor and sensory grossly intact.        Assessment & Plan:

## 2017-01-07 NOTE — Assessment & Plan Note (Signed)
Believe her dizziness is due to anticholinergic side effects.  Continue hydroxyzine.  Discontinue meclizine.

## 2017-01-08 ENCOUNTER — Other Ambulatory Visit: Payer: Self-pay | Admitting: Pharmacist

## 2017-01-08 ENCOUNTER — Other Ambulatory Visit: Payer: Self-pay | Admitting: *Deleted

## 2017-01-08 DIAGNOSIS — L299 Pruritus, unspecified: Secondary | ICD-10-CM

## 2017-01-08 DIAGNOSIS — E7800 Pure hypercholesterolemia, unspecified: Secondary | ICD-10-CM

## 2017-01-08 DIAGNOSIS — G609 Hereditary and idiopathic neuropathy, unspecified: Secondary | ICD-10-CM

## 2017-01-08 NOTE — Patient Outreach (Signed)
Called patient as scheduled to complete the extra help application. Called and spoke with patient. HIPAA identifiers verified and verbal consent received.  Complete extra help application with the patient. Receive permission from the patient to submit the application based on the answers that she provided. Let patient know that she should expect to receive a response from Social Security in the mail in about 2-4 weeks.  Ms. Koran reports that she is currently out of her atorvastatin and low on her gabapentin. [facility] on her behalf and have the pharmacist fill each of these prescriptions. Call Ms. Deviney back and let her know the cost of each. Counsel patient about the importance of medication adherence. Patient reports that she will pick up the atorvastatin today and then pick up the gabapentin when she has the money.   Counsel patient about the cost savings of using mail order pharmacy. Patient verbalizes understanding. Provide patient with the phone number for [facility] mail order pharmacy. Patient states that she will call to get setup with this mail order today.  PLAN:  1) Patient to call [facility] today to start using mail order for cost savings 2) Patient to pick up atorvastatin and start taking it today. 2) Will follow up with patient in 1 month regarding the status of her extra help application, if I have not heard from her before that time.  Harlow Asa, PharmD, [place] Management [phone]

## 2017-01-08 NOTE — Progress Notes (Signed)
Social work consult, patient is requesting transportation assistance to her [facility] appointment on 01/12/17 with Sonia Baller.   LCSW called Marcie Bal [phone] [facility] Social Worker for coordination of care. Patient's SCAT application is pending at this time.   Plan: [facility] will assist with getting patient to the appointment and [facility] would arrange to get patient home.  Taxi voucher will be left with Sonia Baller to give to patient.  Casimer Lanius, LCSW Licensed Clinical Social Worker [place]   [phone] 10:50 AM

## 2017-01-11 ENCOUNTER — Other Ambulatory Visit: Payer: Self-pay | Admitting: *Deleted

## 2017-01-11 DIAGNOSIS — I872 Venous insufficiency (chronic) (peripheral): Secondary | ICD-10-CM

## 2017-01-11 MED ORDER — ATORVASTATIN CALCIUM 40 MG PO TABS: 40.0000 mg | ORAL_TABLET | Freq: Every day | ORAL | 3 refills | Status: DC

## 2017-01-11 MED ORDER — HYDROXYZINE HCL 25 MG PO TABS: 25.0000 mg | ORAL_TABLET | Freq: Three times a day (TID) | ORAL | 3 refills | Status: DC | PRN

## 2017-01-11 MED ORDER — PANTOPRAZOLE SODIUM 40 MG PO TBEC: DELAYED_RELEASE_TABLET | ORAL | 5 refills | Status: DC

## 2017-01-11 MED ORDER — GABAPENTIN 300 MG PO CAPS: 300.0000 mg | ORAL_CAPSULE | Freq: Three times a day (TID) | ORAL | 3 refills | Status: DC

## 2017-01-11 MED ORDER — CLOTRIMAZOLE-BETAMETHASONE 1-0.05 % EX CREA: 1.0000 "application " | TOPICAL_CREAM | Freq: Two times a day (BID) | CUTANEOUS | 0 refills | Status: DC

## 2017-01-12 ENCOUNTER — Ambulatory Visit (INDEPENDENT_AMBULATORY_CARE_PROVIDER_SITE_OTHER): Payer: Medicare Other | Admitting: Psychology

## 2017-01-12 ENCOUNTER — Other Ambulatory Visit: Payer: Self-pay | Admitting: *Deleted

## 2017-01-12 DIAGNOSIS — F411 Generalized anxiety disorder: Secondary | ICD-10-CM

## 2017-01-12 NOTE — Progress Notes (Signed)
Reason for follow-up:  Anxiety  Issues discussed:  Patient discussed her current stressors with her living situation and her dissatisfaction with how she is being treated by her family members. Patient continues to have housing insecurity. She is now back living with her daughter Erica Morris after disagreements with Erica Morris (daugher in law who she had been staying with). We discussed the things that are currently out of her control, such as how her daughter acts and what her daughter says. I worked with her to highlight that allowing this to upset her is not productive given that she cannot control what her adult daughter does. We discussed things that are in the patient's control, such as working with social services to seek out affordable housing, taking her medications that doctors prescribe, and how she acts and reacts to her family members. I encouraged patient to start trying to accept some of the things that she can't control, particularly her adult daughter's behavior, as becoming upset and letting it get to her seems to escalate her stress and conflict. Encouraged patient to use a mantra such as "it is what it is" when dealing with people whose behavior she can't change. I asked patient to keep focusing on things she can change and to keep following up with Erica Morris and social workers who are helping her with medication access issues and housing.  I gave patient a Medicaid application that Erica Morris asked Erica Morris to pass along. I told the patient that Erica Morris would be in touch about this application.  Patient would like [place] to use her phone number for communication: [phone].  Patient required a taxi voucher to get home so I completed a voucher for her and taxi #[id number] from [facility] was scheduled to pick her up.

## 2017-01-12 NOTE — Patient Outreach (Addendum)
[facility]) Care Management  01/12/2017  MARKETIA STALLSMITH [birth year] [id number]  Follow up telephone call  Spoke with patient reports she is doing well. Patient discussed her recent visit with counselor and that she is working on the breathing exercises when she gets stressed.  Patient states she is taking her medications as prescribed, and has all of her medications, she reports she has received a call from the mail order pharmacy but she told them she had every thing right now.  Patient reports she still has the rash on her legs,she is still using the cream prescribed by MD. Patient denies dizziness and is tolerating her usual activity at home.   Patient discussed her biggest concern is being able to get into a low income housing, she states Marcie Bal , [place] will be working with her.   Nursing care goals have been met, patient has completed 60 day  complex care management transition of care program,  patient denies new concerns agreeable to closing case to care management, she is aware she will currently be open to Education officer, museum and pharmacist for now.    Plan Will plan case closure to nursing care management as goals are met. Will notify [facility], pharmacist and Social worker of case closure, and CMA.   [name], RN, [place] Management [phone]- Mobile [phone]- Toll Free Main Office

## 2017-01-12 NOTE — Patient Instructions (Addendum)
It was great to see you today - thanks for coming in!  Keep working with Marcie Bal to follow up about affordable housing options.   Practice your breathing, especially when you feel stressed. Try to focus on the things you CAN control. When something is out of your control, remind yourself "it is what it is."   I'll call you in 1-2 weeks to check in. If you'd like to schedule another counseling appointment, call the front office and ask to schedule with Sonia Baller (the [facility] on Tuesdays).

## 2017-01-12 NOTE — Assessment & Plan Note (Signed)
Assessment/Plan/Recommendations:  Patient sees having a more stable living situation is the only way for her to feel better. She would like to live on her own. She states that Marcie Bal is helping her find affordable housing. I worked with patient today to encourage radical acceptance of others' behavior as she continues to be distressed by things she can't control. She responded somewhat to this but continues to feel stressed. Patient reports that she is using the deep breathing technique frequently and feels that it is helpful. She agreed to receiving a followup phone call from me in a couple of weeks.

## 2017-01-13 ENCOUNTER — Other Ambulatory Visit: Payer: Self-pay | Admitting: *Deleted

## 2017-01-13 ENCOUNTER — Encounter: Payer: Self-pay | Admitting: *Deleted

## 2017-01-13 DIAGNOSIS — I872 Venous insufficiency (chronic) (peripheral): Secondary | ICD-10-CM

## 2017-01-14 MED ORDER — TRIAMCINOLONE ACETONIDE 0.1 % EX OINT: 1.0000 "application " | TOPICAL_OINTMENT | Freq: Two times a day (BID) | CUTANEOUS | 1 refills | Status: DC

## 2017-01-22 ENCOUNTER — Other Ambulatory Visit: Payer: Self-pay | Admitting: *Deleted

## 2017-01-22 NOTE — Patient Outreach (Signed)
[facility]) Care Management  01/22/2017  Erica Morris 09-18-1946 [id number]   CSW spoke to patient who plans to go to [place] office next week to complete application/assessment process. "They're gonna pick me up". She appears to be in good spirits today staying with her daughter and denies any concerns or issues at this time.   CSW will plan f/u call next week.         Eduard Clos, MSW, [place] Worker  [place] [phone]

## 2017-01-27 ENCOUNTER — Other Ambulatory Visit: Payer: Self-pay | Admitting: *Deleted

## 2017-01-27 ENCOUNTER — Telehealth: Payer: Self-pay | Admitting: Family Medicine

## 2017-01-27 NOTE — Patient Outreach (Signed)
   [facility]) Care Management  01/27/2017  Erica Morris 1946/03/23 [id number]   CSW assisting patient with SCAT application and will f/u with [facility] regarding diagnosis/needs.m Patient in good spirits today- denies any concerns or needs.  PLan f/u next week re: SCAT, etc.    Eduard Clos, MSW, [place] Worker  [place] [phone]

## 2017-01-27 NOTE — Telephone Encounter (Signed)
Pt had a meeting with social services to get SCAT and the social worker put on the application that the pt has heart failure and COPD. Pt states she has never been told she had these conditions and is concerned. Pt would like Dr. Andria Frames or his nurse to call her. ep

## 2017-01-28 ENCOUNTER — Other Ambulatory Visit: Payer: Medicare Other | Admitting: *Deleted

## 2017-01-28 LAB — URINE CULTURE

## 2017-01-28 NOTE — Telephone Encounter (Signed)
Pt is very upset. She wants to know if she has heart failure and COPD. ep

## 2017-01-29 NOTE — Telephone Encounter (Signed)
I see no direct evidence supporting either COPD or CHF.  Does have a recent echo that suggests diastolic dysfunction.  I believe she is a long term smoker so likely needs PFTs.  And she is anxious.    Best suggestion is office visit to review information, fill out form (which I have) and discuss directly to allay anxiety.

## 2017-01-29 NOTE — Telephone Encounter (Signed)
LVM for pt to call office to inform her of below and see about getting her scheduled for an appointment per Dr. Andria Frames message. Please inform her and assist her in scheduling and appointment. Katharina Caper, [name], [place]

## 2017-02-01 ENCOUNTER — Other Ambulatory Visit: Payer: Self-pay | Admitting: *Deleted

## 2017-02-02 NOTE — Patient Outreach (Signed)
  [facility]) Care Management  [facility] Social Work  02/02/2017  Erica Morris 11-28-1946 [id number]  Subjective: Patient reports she mailed her Medicaid application to DSS.   Objective: CSW to assist patient with commuity based resources to aide in her well-being, quality of life and overall safety/needs.    Encounter Medications:  Outpatient Encounter Prescriptions as of 02/01/2017  Medication Sig Note  . aspirin EC 81 MG tablet Take 81 mg by mouth every morning.    Marland Kitchen atorvastatin (LIPITOR) 40 MG tablet Take 1 tablet (40 mg total) by mouth daily.   . clotrimazole-betamethasone (LOTRISONE) cream Apply 1 application topically 2 (two) times daily.   . furosemide (LASIX) 20 MG tablet Take 1 tablet (20 mg total) by mouth daily.   Marland Kitchen gabapentin (NEURONTIN) 300 MG capsule Take 1 capsule (300 mg total) by mouth 3 (three) times daily.   . hydrOXYzine (ATARAX/VISTARIL) 25 MG tablet Take 1 tablet (25 mg total) by mouth 3 (three) times daily as needed.   Marland Kitchen levothyroxine (SYNTHROID, LEVOTHROID) 25 MCG tablet TAKE 1 TABLET BY MOUTH EVERY DAY (OKAY TO CHANGE TO MYLAN BRAND PER DR. KWIATKOWSKI) 12/24/2016: Out of medication   . pantoprazole (PROTONIX) 40 MG tablet TAKE 1 TABLET(40 MG) BY MOUTH DAILY   . triamcinolone ointment (KENALOG) 0.1 % Apply 1 application topically 2 (two) times daily.    No facility-administered encounter medications on file as of 02/01/2017.     Functional Status:  In your present state of health, do you have any difficulty performing the following activities: 11/10/2016 11/07/2016  Hearing? N N  Vision? N N  Difficulty concentrating or making decisions? Y N  Walking or climbing stairs? Y N  Dressing or bathing? N N  Doing errands, shopping? Y N  Preparing Food and eating ? Y -  Using the Toilet? N -  In the past six months, have you accidently leaked urine? Y -  Do you have problems with loss of bowel control? N -  Managing your Medications? N -  Managing  your Finances? N -  Housekeeping or managing your Housekeeping? Y -  Some recent data might be hidden    Fall/Depression Screening:  PHQ 2/9 Scores 01/12/2017 01/06/2017 12/15/2016 12/03/2016 11/10/2016 11/05/2016 06/09/2016  PHQ - 2 Score 0 0 0 0 4 2 1   PHQ- 9 Score - - - - 11 - -    Assessment: CSW spoke with patient who has mailed in her Medicaid application to DSS. She is in good spirits and denies any concerns or needs.  She is also awaiting word on her SCAT application and hopes to see the counselor at PCP's office again soon.  Plan:  CSW will f/u on Medicaid and SCAT applications in the next 2-3 weeks and f/u with patient.    Eduard Clos, MSW, [place] Worker  [place] [phone]

## 2017-02-03 ENCOUNTER — Other Ambulatory Visit: Payer: Self-pay | Admitting: Pharmacist

## 2017-02-03 NOTE — Patient Outreach (Signed)
Outreach call to [name] to follow up about medication assistance. Called and spoke with patient. HIPAA identifiers verified and verbal consent received.  Erica Morris reports that she has been feeling short of breath recently. Reports that she has been using her albuterol inhaler about twice/day when she feels out of breath with activity. Advise patient to call her PCP to discuss symptoms and schedule an appointment. Erica Morris states that she will call the office today as soon as we get off of the phone.  Erica Morris reports that as we discussed she did call [facility] to arrange to receive her prescriptions through mail order for cost savings. Reports that she now has all of her medications and finds them to be affordable to her. Let Erica Morris know that I also called [employer]'s Over the Counter (OTC) benefit line and found that she has a $40/ quarter benefit. Provide patient with the phone number for the [employer] OTC benefit line.  Erica Morris reports that she has no further medication questions/concerns at this time. Ask that patient call me in the future should she have any questions/concerns.  PLAN:  1) Erica Morris to call to schedule follow up appointment with her PCP 2) I will close pharmacy episode at this time.  Erica Morris, PharmD, [place] Management [phone]

## 2017-02-04 NOTE — Telephone Encounter (Signed)
Contacted pt to give her the below information and see about scheduling her an appointment and she said that she would have to call back once she gets her transportation in place. Katharina Caper, [name], [place]

## 2017-02-15 ENCOUNTER — Telehealth: Payer: Self-pay | Admitting: Family Medicine

## 2017-02-15 ENCOUNTER — Other Ambulatory Visit: Payer: Self-pay | Admitting: *Deleted

## 2017-02-15 MED ORDER — FUROSEMIDE 20 MG PO TABS: 20.0000 mg | ORAL_TABLET | Freq: Every day | ORAL | 3 refills | Status: DC

## 2017-02-15 NOTE — Telephone Encounter (Signed)
PA form for hydroxyzine placed in provider box for review.  Derl Barrow, RN

## 2017-02-15 NOTE — Telephone Encounter (Signed)
Pt states insurance will not pay for hydroxyzine. Pt might need prior authorization. If insurance still will not pay for it pt would like to have something else called in to [facility]. ep

## 2017-02-16 ENCOUNTER — Other Ambulatory Visit: Payer: Self-pay | Admitting: *Deleted

## 2017-02-16 NOTE — Telephone Encounter (Signed)
Form completed.

## 2017-02-17 NOTE — Patient Outreach (Signed)
[name]) Care Management  02/17/2017  Erica Morris 1946-03-26 [id number]  Patient reports she has not received word from SCAT about her transportation application- she went forher "assessment" and is awaiting word.  CSW contacted SCAT and they are awaiting paperwork from PCP. CSW has provided info that may help in the processing and will check in next week for update/decision.  Patient denies any concerns or needs at this time. She remains in her daughters home in [place].    Eduard Clos, MSW, [place] Worker  [place] [phone]

## 2017-02-18 NOTE — Telephone Encounter (Signed)
PA form faxed to [facility] for review. Martin, Tamika L, RN  

## 2017-02-19 NOTE — Telephone Encounter (Signed)
Medication is on patient's list of covered drugs.  PA is not required at this time.  If pharmacy has questions regarding the processing of medication they should call the help desk at [phone].  Derl Barrow, RN

## 2017-02-21 DIAGNOSIS — S8001XA Contusion of right knee, initial encounter: Secondary | ICD-10-CM | POA: Diagnosis not present

## 2017-02-21 DIAGNOSIS — M79601 Pain in right arm: Secondary | ICD-10-CM | POA: Diagnosis not present

## 2017-02-22 ENCOUNTER — Other Ambulatory Visit: Payer: Self-pay | Admitting: Family Medicine

## 2017-02-22 NOTE — Telephone Encounter (Signed)
Patient states she was having difficulty refilling her RX for gabapentin. I was unable to completely understand what the issue was due to poor connection of patient's phone. Please advise.

## 2017-02-25 ENCOUNTER — Other Ambulatory Visit: Payer: Self-pay | Admitting: *Deleted

## 2017-02-25 NOTE — Telephone Encounter (Signed)
LVM for pt to call back to let her know that I contacted her pharmacy and they stated that they shipped a 90 day supply to her on 01/28/17.  She is not due to fill again until 03/29/17. She may have 1 or 2 bottles that were sent and she received 270 pills and this is a 3 month supply.  Please inform pt. Katharina Caper, [name], [place]

## 2017-02-26 NOTE — Patient Outreach (Addendum)
  [facility]) Care Management  [facility] Social Work  02/26/2017  Erica Morris 08-19-1946 [id number]  Subjective:  "I got something from Medicaid but I don't understand it"  Objective: CSW to assist patient with commuity based resources to aide in her well-being, quality of life and overall safety/needs.    Current Medications:  Current Outpatient Prescriptions  Medication Sig Dispense Refill  . albuterol (PROVENTIL HFA;VENTOLIN HFA) 108 (90 Base) MCG/ACT inhaler Inhale 2 puffs into the lungs every 4 (four) hours as needed for wheezing or shortness of breath.    Marland Kitchen aspirin EC 81 MG tablet Take 81 mg by mouth every morning.     Marland Kitchen atorvastatin (LIPITOR) 40 MG tablet Take 1 tablet (40 mg total) by mouth daily. 90 tablet 3  . clotrimazole-betamethasone (LOTRISONE) cream Apply 1 application topically 2 (two) times daily. 30 g 0  . furosemide (LASIX) 20 MG tablet Take 1 tablet (20 mg total) by mouth daily. 90 tablet 3  . gabapentin (NEURONTIN) 300 MG capsule Take 1 capsule (300 mg total) by mouth 3 (three) times daily. 90 capsule 3  . hydrOXYzine (ATARAX/VISTARIL) 25 MG tablet Take 1 tablet (25 mg total) by mouth 3 (three) times daily as needed. 90 tablet 3  . levothyroxine (SYNTHROID, LEVOTHROID) 25 MCG tablet TAKE 1 TABLET BY MOUTH EVERY DAY (OKAY TO CHANGE TO MYLAN BRAND PER DR. KWIATKOWSKI) 90 tablet 1  . pantoprazole (PROTONIX) 40 MG tablet TAKE 1 TABLET(40 MG) BY MOUTH DAILY 30 tablet 5  . triamcinolone ointment (KENALOG) 0.1 % Apply 1 application topically 2 (two) times daily. 453.6 g 1   No current facility-administered medications for this visit.     Functional Status:  In your present state of health, do you have any difficulty performing the following activities: 11/10/2016 11/07/2016  Hearing? N N  Vision? N N  Difficulty concentrating or making decisions? Y N  Walking or climbing stairs? Y N  Dressing or bathing? N N  Doing errands, shopping? Y N  Preparing  Food and eating ? Y -  Using the Toilet? N -  In the past six months, have you accidently leaked urine? Y -  Do you have problems with loss of bowel control? N -  Managing your Medications? N -  Managing your Finances? N -  Housekeeping or managing your Housekeeping? Y -  Some recent data might be hidden    Fall/Depression Screening:  PHQ 2/9 Scores 01/12/2017 01/06/2017 12/15/2016 12/03/2016 11/10/2016 11/05/2016 06/09/2016  PHQ - 2 Score 0 0 0 0 4 2 1   PHQ- 9 Score - - - - 11 - -    Assessment:  CSW spoke with patient by phone-  CSW advised her she has been approved for SCAT and should be getting an ID in the mail.  CSW discussed process for arranging rides via SCAT with patient.   Patient also reports a letter from [facility] but she does not understand it- Patient agreeable to CSW coming by her daughters home next week (will meet in the driveway as her daughter does not want people in the home) and review the Medicaid letter with her.    Plan:  Plan home visit next week.   Eduard Clos, MSW, [place] Worker  [place] [phone]

## 2017-03-04 ENCOUNTER — Other Ambulatory Visit: Payer: Self-pay | Admitting: *Deleted

## 2017-03-05 NOTE — Patient Outreach (Signed)
  [facility] [facility]) Care Management  [facility] Social Work  03/05/2017  DOLL FRAZEE [birth year] [id number]  Subjective:  "I got this letter in mail and don't know what it means"  Objective:  CSW to assist patient with commuity based resources to aide in her well-being, quality of life and overall safety/needs.    Encounter Medications:  Outpatient Encounter Prescriptions as of 03/04/2017  Medication Sig Note  . albuterol (PROVENTIL HFA;VENTOLIN HFA) 108 (90 Base) MCG/ACT inhaler Inhale 2 puffs into the lungs every 4 (four) hours as needed for wheezing or shortness of breath.   Marland Kitchen aspirin EC 81 MG tablet Take 81 mg by mouth every morning.    Marland Kitchen atorvastatin (LIPITOR) 40 MG tablet Take 1 tablet (40 mg total) by mouth daily.   . clotrimazole-betamethasone (LOTRISONE) cream Apply 1 application topically 2 (two) times daily.   . furosemide (LASIX) 20 MG tablet Take 1 tablet (20 mg total) by mouth daily.   Marland Kitchen gabapentin (NEURONTIN) 300 MG capsule Take 1 capsule (300 mg total) by mouth 3 (three) times daily.   . hydrOXYzine (ATARAX/VISTARIL) 25 MG tablet Take 1 tablet (25 mg total) by mouth 3 (three) times daily as needed.   Marland Kitchen levothyroxine (SYNTHROID, LEVOTHROID) 25 MCG tablet TAKE 1 TABLET BY MOUTH EVERY DAY (OKAY TO CHANGE TO MYLAN BRAND PER DR. KWIATKOWSKI) 12/24/2016: Out of medication   . pantoprazole (PROTONIX) 40 MG tablet TAKE 1 TABLET(40 MG) BY MOUTH DAILY   . triamcinolone ointment (KENALOG) 0.1 % Apply 1 application topically 2 (two) times daily.    No facility-administered encounter medications on file as of 03/04/2017.     Functional Status:  In your present state of health, do you have any difficulty performing the following activities: 11/10/2016 11/07/2016  Hearing? N N  Vision? N N  Difficulty concentrating or making decisions? Y N  Walking or climbing stairs? Y N  Dressing or bathing? N N  Doing errands, shopping? Y N  Preparing Food and eating ? Y -  Using the  Toilet? N -  In the past six months, have you accidently leaked urine? Y -  Do you have problems with loss of bowel control? N -  Managing your Medications? N -  Managing your Finances? N -  Housekeeping or managing your Housekeeping? Y -  Some recent data might be hidden    Fall/Depression Screening:  PHQ 2/9 Scores 01/12/2017 01/06/2017 12/15/2016 12/03/2016 11/10/2016 11/05/2016 06/09/2016  PHQ - 2 Score 0 0 0 0 _0 PHQ- 9 Score - - - - 11 - -    Assessment:  CSW met patient in driveway per her request to provide SCAT approval letter and other information from SCAT. Patient shared a letter and forms from DSS that need to be completed for Medicaid to determine transportation,etc eligibiltiy.     CSW assisted patient in completing paperwork and will place in the mail to DSS.  Plan: CSW will plan f/u call to see if she has been successful in using SCAT in thenext 2-3 weeks.    Eduard Clos, MSW, [place] Worker  [place] [phone]

## 2017-03-10 ENCOUNTER — Encounter: Payer: Self-pay | Admitting: Family Medicine

## 2017-03-10 ENCOUNTER — Ambulatory Visit (INDEPENDENT_AMBULATORY_CARE_PROVIDER_SITE_OTHER): Payer: Medicare Other | Admitting: Family Medicine

## 2017-03-10 DIAGNOSIS — M4722 Other spondylosis with radiculopathy, cervical region: Secondary | ICD-10-CM | POA: Insufficient documentation

## 2017-03-10 DIAGNOSIS — G609 Hereditary and idiopathic neuropathy, unspecified: Secondary | ICD-10-CM

## 2017-03-10 DIAGNOSIS — I5032 Chronic diastolic (congestive) heart failure: Secondary | ICD-10-CM | POA: Diagnosis not present

## 2017-03-10 DIAGNOSIS — I872 Venous insufficiency (chronic) (peripheral): Secondary | ICD-10-CM

## 2017-03-10 DIAGNOSIS — F411 Generalized anxiety disorder: Secondary | ICD-10-CM | POA: Diagnosis not present

## 2017-03-10 DIAGNOSIS — I50810 Right heart failure, unspecified: Secondary | ICD-10-CM | POA: Insufficient documentation

## 2017-03-10 MED ORDER — FUROSEMIDE 20 MG PO TABS: 20.0000 mg | ORAL_TABLET | Freq: Every day | ORAL | 3 refills | Status: DC

## 2017-03-10 MED ORDER — DULOXETINE HCL 30 MG PO CPEP: 30.0000 mg | ORAL_CAPSULE | Freq: Every day | ORAL | 3 refills | Status: DC

## 2017-03-10 NOTE — Progress Notes (Signed)
   Subjective:    Patient ID: Erica Morris, female    DOB: 07/18/1946, 71 y.o.   MRN: [id number]  HPI  Patient has a host of complaints. 1. Dry mouth - chronic.  Now off all antihistamines. 2. Leg itching - states not helped with hydroxycine.  Could not afford and has been off for several weeks. 3. Gabapentin not helping leg itching/discomfort.   4. CO right arm discomfort.  Sounds neuropathic.  Had sig DJD and spondylosis on previous neck CT. 5. States has developed SOB on exertion.  Previous echo of 86/77 showed diastolic dysfunction.  For unclear reasons, she has not been taking her lasix for a few weeks.Denies chest patin.   6. States she is very anxious.  "My nerves are terrible."  Hydroxycine did not help.   Review of Systems     Objective:   Physical Exam Normal strength of Right upper ext.  Some hyperesthesia.  DTRs OK Lungs clear Cardiac RRR without m or g Legs 2+ edema.  Nicely the pretibial skin rash is much improved. Wt gain noted.        Assessment & Plan:

## 2017-03-10 NOTE — Patient Instructions (Addendum)
Stop the hydroxyzine. Stop the gabapentin. You need to get back on the furosemide/fluid pill.  I think your shortness of breath is coming from extra fluid in your lungs.  The fluid pill will help. I am going to send in a new pill for nerve pain and anxiety - duloxetine.  If we are lucky, it will help your nerves, your arm pain and your leg itching.  It takes a while to get in your system, so you need to be on it every day for a month before we can say if it is working or not.

## 2017-03-10 NOTE — Assessment & Plan Note (Signed)
DC gabapentin.  I hope cymbalta helps.

## 2017-03-10 NOTE — Assessment & Plan Note (Signed)
Suspect right arm pain coming from radiculopathy.  Obviously, gabapentin not treating.  Start cymbalta.

## 2017-03-10 NOTE — Assessment & Plan Note (Signed)
Stop hydroxycine.  Add Cymbalta.

## 2017-03-10 NOTE — Assessment & Plan Note (Signed)
Rash much improved.

## 2017-03-10 NOTE — Assessment & Plan Note (Signed)
Poor control.  Restart lasix.  Presume this will help her DOE.

## 2017-03-19 ENCOUNTER — Other Ambulatory Visit: Payer: Self-pay | Admitting: *Deleted

## 2017-03-19 DIAGNOSIS — H04123 Dry eye syndrome of bilateral lacrimal glands: Secondary | ICD-10-CM | POA: Diagnosis not present

## 2017-03-19 DIAGNOSIS — H40011 Open angle with borderline findings, low risk, right eye: Secondary | ICD-10-CM | POA: Diagnosis not present

## 2017-03-19 DIAGNOSIS — H52223 Regular astigmatism, bilateral: Secondary | ICD-10-CM | POA: Diagnosis not present

## 2017-03-19 DIAGNOSIS — H26493 Other secondary cataract, bilateral: Secondary | ICD-10-CM | POA: Diagnosis not present

## 2017-03-19 DIAGNOSIS — H35341 Macular cyst, hole, or pseudohole, right eye: Secondary | ICD-10-CM | POA: Diagnosis not present

## 2017-03-22 ENCOUNTER — Other Ambulatory Visit: Payer: Self-pay | Admitting: *Deleted

## 2017-03-22 DIAGNOSIS — E039 Hypothyroidism, unspecified: Secondary | ICD-10-CM

## 2017-03-23 ENCOUNTER — Other Ambulatory Visit: Payer: Self-pay | Admitting: *Deleted

## 2017-03-23 NOTE — Patient Outreach (Signed)
[facility] [facility]) Care Management  03/23/2017  Erica Morris November 24, 1946 [id number]   CSW spoke with patient today by phone. She has been able to use SCAT to get to and from her appointments and is pleased to have this service. She continues to seek housing options and reports she is on the waiting list and looking into other options as well.  Patient denies any needs at this time. CSW will plan f/u cal lin 1-2 weeks and possible case closure at that time.   Eduard Clos, MSW, [place] Worker  [place] [phone]

## 2017-03-24 MED ORDER — LEVOTHYROXINE SODIUM 25 MCG PO TABS: 25.0000 ug | ORAL_TABLET | Freq: Every day | ORAL | 0 refills | Status: DC

## 2017-03-24 NOTE — Telephone Encounter (Signed)
2nd request.  Martin, Tamika L, RN  

## 2017-03-24 NOTE — Telephone Encounter (Signed)
Tsh reviewed Will give 3 m refill Dorcas Mcmurray

## 2017-04-02 ENCOUNTER — Ambulatory Visit: Payer: Self-pay | Admitting: *Deleted

## 2017-04-02 ENCOUNTER — Ambulatory Visit (INDEPENDENT_AMBULATORY_CARE_PROVIDER_SITE_OTHER): Payer: Medicare Other | Admitting: Family Medicine

## 2017-04-02 ENCOUNTER — Encounter: Payer: Self-pay | Admitting: Family Medicine

## 2017-04-02 VITALS — BP 140/80 | HR 64 | Temp 97.6°F | Ht 64.0 in | Wt 193.8 lb

## 2017-04-02 DIAGNOSIS — B372 Candidiasis of skin and nail: Secondary | ICD-10-CM | POA: Insufficient documentation

## 2017-04-02 MED ORDER — KETOCONAZOLE 2 % EX CREA: 1.0000 "application " | TOPICAL_CREAM | Freq: Every day | CUTANEOUS | 0 refills | Status: DC

## 2017-04-02 NOTE — Progress Notes (Signed)
    Subjective:  Erica Morris is a 71 y.o. female who presents to the [facility] today with a chief complaint of rash.   HPI:  Rash in inguinal area b/l for the past 2 weeks, started off small but now has spread and in the skin folds. Very itchy and burns and is painful. Has been trying warm soaks, OTC calamine lotions, steroid creams.   ROS: Per HPI  Objective:  Physical Exam: BP 140/80 (BP Location: Left Arm, Patient Position: Sitting, Cuff Size: Normal)   Pulse 64   Temp 97.6 F (36.4 C) (Oral)   Ht 5\' 4"  (1.626 m)   Wt 193 lb 12.8 oz (87.9 kg)   SpO2 99%   BMI 33.27 kg/m   Gen: NAD, resting comfortably Skin: erythematous rash in the b/l inguinal areas with macerated appearing patches with erythematous satellite papules. Does not extend into vaginal area. No rashes on her back.    Assessment/Plan:  Candidiasis, intertrigo Fungal infection c/w candidal intertrigo in inguinal distribution since is intensely pruritic. - Start topical ketoconazole qd for 2 weeks. Discussed if worsens or fails to improve, may need oral antifungal. - Discussed keeping area clean and dry.   Bufford Lope, DO PGY-1, [facility] 04/02/2017 8:57 AM

## 2017-04-02 NOTE — Assessment & Plan Note (Signed)
Fungal infection c/w candidal intertrigo in inguinal distribution since is intensely pruritic. - Start topical ketoconazole qd for 2 weeks. Discussed if worsens or fails to improve, may need oral antifungal. - Discussed keeping area clean and dry.

## 2017-04-02 NOTE — Patient Instructions (Addendum)
It was great to see you again!  For your fungal infection,  - Please apply the fungal infection once daily for 2 weeks. If it worsens after a week or if it not better in 2 weeks you may need an oral antifungal. - Keep it dry and clean  Take care and seek immediate care sooner if you develop any concerns.   Dr. Bufford Lope, [place]

## 2017-04-27 ENCOUNTER — Ambulatory Visit: Payer: Self-pay | Admitting: *Deleted

## 2017-04-27 ENCOUNTER — Other Ambulatory Visit: Payer: Self-pay | Admitting: *Deleted

## 2017-04-29 ENCOUNTER — Ambulatory Visit: Payer: Self-pay | Admitting: *Deleted

## 2017-04-30 ENCOUNTER — Ambulatory Visit: Payer: Self-pay | Admitting: *Deleted

## 2017-05-12 ENCOUNTER — Other Ambulatory Visit: Payer: Self-pay | Admitting: Family Medicine

## 2017-05-12 DIAGNOSIS — E039 Hypothyroidism, unspecified: Secondary | ICD-10-CM

## 2017-05-20 ENCOUNTER — Other Ambulatory Visit: Payer: Self-pay | Admitting: *Deleted

## 2017-05-20 ENCOUNTER — Encounter: Payer: Self-pay | Admitting: *Deleted

## 2017-05-20 NOTE — Patient Outreach (Signed)
[facility]) Care Management  05/20/2017  ALLICE GARRO [birth year] [id number]   CSW has been unable to reach patient for follow up. Patient has been linked with SCAT for transportation, Medicaid and housing options to consider.  CSW will close referral at this time and advise PCP and [facility] team.   Eduard Clos, MSW, [place] Worker  [place] [phone]

## 2017-07-19 ENCOUNTER — Other Ambulatory Visit: Payer: Self-pay | Admitting: *Deleted

## 2017-07-19 MED ORDER — PANTOPRAZOLE SODIUM 40 MG PO TBEC: DELAYED_RELEASE_TABLET | ORAL | 1 refills | Status: DC

## 2017-07-29 ENCOUNTER — Ambulatory Visit (INDEPENDENT_AMBULATORY_CARE_PROVIDER_SITE_OTHER): Payer: Medicare Other | Admitting: Family Medicine

## 2017-07-29 ENCOUNTER — Encounter: Payer: Self-pay | Admitting: Family Medicine

## 2017-07-29 DIAGNOSIS — G609 Hereditary and idiopathic neuropathy, unspecified: Secondary | ICD-10-CM | POA: Diagnosis not present

## 2017-07-29 DIAGNOSIS — Z23 Encounter for immunization: Secondary | ICD-10-CM | POA: Diagnosis not present

## 2017-07-29 DIAGNOSIS — F329 Major depressive disorder, single episode, unspecified: Secondary | ICD-10-CM | POA: Diagnosis not present

## 2017-07-29 DIAGNOSIS — F32A Depression, unspecified: Secondary | ICD-10-CM

## 2017-07-29 MED ORDER — DOXEPIN HCL 25 MG PO CAPS: 25.0000 mg | ORAL_CAPSULE | Freq: Every day | ORAL | 3 refills | Status: DC

## 2017-07-29 NOTE — Patient Instructions (Addendum)
Stop the duloxetine.  It is not a good medicine for you. I want you to wait one week before starting the new medication.   Drink a lot of water. Cut back on the soft drinks. Please make an appointment for an annual medicare wellness visit. Stop scratching your legs. See me in one month to see if the new medication is working for you.

## 2017-07-30 NOTE — Assessment & Plan Note (Signed)
Despite knowing it is on the [place] list, I will try low dose doxepin at night for depression and neuropathic dysasthesias.  Hopefully witll help with pruritis.

## 2017-07-30 NOTE — Assessment & Plan Note (Signed)
I hope the doxepin helps.  DC the duloxetine.

## 2017-07-30 NOTE — Progress Notes (Signed)
   Subjective:    Patient ID: Erica Morris, female    DOB: May 23, 1946, 71 y.o.   MRN: [id number]  HPI Erica Morris is follow up itching, pain and anxiety.  She did not improve with duloxetine.  In fact, thinks her symptoms are now a side effect.  Brings in a printout with multiple side effect/symptoms highlighted.  Scratching legs again.   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  Every time anxiety worsens, so does itching/scratching.  "I can't help it."  C/O dizziness - another chronic problem  Worse on duloxetine.  Has previously been to neurovestibular rehab without benefit.       Review of Systems     Objective:   Physical Exam  Excoriated arms and legs. Psych, she is actually cheerful and animated as she describes her multiple symptoms.  Reminds me of         Assessment & Plan:

## 2017-08-04 DIAGNOSIS — H40011 Open angle with borderline findings, low risk, right eye: Secondary | ICD-10-CM | POA: Diagnosis not present

## 2017-08-04 DIAGNOSIS — H18413 Arcus senilis, bilateral: Secondary | ICD-10-CM | POA: Diagnosis not present

## 2017-08-04 DIAGNOSIS — S0500XA Injury of conjunctiva and corneal abrasion without foreign body, unspecified eye, initial encounter: Secondary | ICD-10-CM | POA: Diagnosis not present

## 2017-08-04 DIAGNOSIS — H40021 Open angle with borderline findings, high risk, right eye: Secondary | ICD-10-CM | POA: Diagnosis not present

## 2017-08-04 DIAGNOSIS — H04123 Dry eye syndrome of bilateral lacrimal glands: Secondary | ICD-10-CM | POA: Diagnosis not present

## 2017-08-05 ENCOUNTER — Other Ambulatory Visit: Payer: Self-pay | Admitting: Family Medicine

## 2017-08-05 MED ORDER — MECLIZINE HCL 12.5 MG PO TABS: 12.5000 mg | ORAL_TABLET | Freq: Three times a day (TID) | ORAL | 4 refills | Status: DC | PRN

## 2017-08-08 ENCOUNTER — Emergency Department (HOSPITAL_COMMUNITY)
Admission: EM | Admit: 2017-08-08 | Discharge: 2017-08-08 | Disposition: A | Discharge status: Home / Self Care | Payer: Medicare Other | Attending: Emergency Medicine | Admitting: Emergency Medicine

## 2017-08-08 ENCOUNTER — Encounter (HOSPITAL_COMMUNITY): Payer: Self-pay

## 2017-08-08 ENCOUNTER — Emergency Department (HOSPITAL_COMMUNITY): Payer: Medicare Other

## 2017-08-08 DIAGNOSIS — H8113 Benign paroxysmal vertigo, bilateral: Secondary | ICD-10-CM | POA: Insufficient documentation

## 2017-08-08 DIAGNOSIS — Z79899 Other long term (current) drug therapy: Secondary | ICD-10-CM | POA: Insufficient documentation

## 2017-08-08 DIAGNOSIS — Z8673 Personal history of transient ischemic attack (TIA), and cerebral infarction without residual deficits: Secondary | ICD-10-CM | POA: Insufficient documentation

## 2017-08-08 DIAGNOSIS — I5032 Chronic diastolic (congestive) heart failure: Secondary | ICD-10-CM | POA: Insufficient documentation

## 2017-08-08 DIAGNOSIS — Z885 Allergy status to narcotic agent status: Secondary | ICD-10-CM | POA: Insufficient documentation

## 2017-08-08 DIAGNOSIS — I110 Hypertensive heart disease with heart failure: Secondary | ICD-10-CM | POA: Diagnosis not present

## 2017-08-08 DIAGNOSIS — E039 Hypothyroidism, unspecified: Secondary | ICD-10-CM | POA: Insufficient documentation

## 2017-08-08 DIAGNOSIS — Z7982 Long term (current) use of aspirin: Secondary | ICD-10-CM | POA: Diagnosis not present

## 2017-08-08 DIAGNOSIS — R42 Dizziness and giddiness: Secondary | ICD-10-CM | POA: Diagnosis present

## 2017-08-08 LAB — CBC
HCT: 37.9 % (ref 36.0–46.0)
Hemoglobin: 11.9 g/dL — ABNORMAL LOW (ref 12.0–15.0)
MCH: 26.2 pg (ref 26.0–34.0)
MCHC: 31.4 g/dL (ref 30.0–36.0)
MCV: 83.5 fL (ref 78.0–100.0)
Platelets: 258 10*3/uL (ref 150–400)
RBC: 4.54 MIL/uL (ref 3.87–5.11)
RDW: 13.4 % (ref 11.5–15.5)
WBC: 8.9 10*3/uL (ref 4.0–10.5)

## 2017-08-08 LAB — URINALYSIS, ROUTINE W REFLEX MICROSCOPIC
Bacteria, UA: NONE SEEN
Bilirubin Urine: NEGATIVE
Glucose, UA: NEGATIVE mg/dL
Hgb urine dipstick: NEGATIVE
Ketones, ur: NEGATIVE mg/dL
Leukocytes, UA: NEGATIVE
Nitrite: NEGATIVE
Protein, ur: NEGATIVE mg/dL
Specific Gravity, Urine: 1.016 (ref 1.005–1.030)
pH: 6 (ref 5.0–8.0)

## 2017-08-08 LAB — BASIC METABOLIC PANEL
Anion gap: 9 (ref 5–15)
BUN: 17 mg/dL (ref 6–20)
CO2: 27 mmol/L (ref 22–32)
Calcium: 9.3 mg/dL (ref 8.9–10.3)
Chloride: 103 mmol/L (ref 101–111)
Creatinine, Ser: 1.27 mg/dL — ABNORMAL HIGH (ref 0.44–1.00)
GFR calc Af Amer: 48 mL/min — ABNORMAL LOW (ref >60)
GFR calc non Af Amer: 42 mL/min — ABNORMAL LOW (ref >60)
Glucose, Bld: 106 mg/dL — ABNORMAL HIGH (ref 65–99)
Potassium: 4 mmol/L (ref 3.5–5.1)
Sodium: 139 mmol/L (ref 135–145)

## 2017-08-08 LAB — ETHANOL: Alcohol, Ethyl (B): 6 mg/dL — ABNORMAL HIGH (ref <5)

## 2017-08-08 MED ORDER — SODIUM CHLORIDE 0.9 % IV BOLUS (SEPSIS)
500.0000 mL | Freq: Once | INTRAVENOUS | Status: AC
  Administered 2017-08-08: 500 mL via INTRAVENOUS

## 2017-08-08 NOTE — ED Notes (Signed)
Pt took all of her wires off and also took out her own IV

## 2017-08-08 NOTE — Discharge Instructions (Signed)
Follow-up with the, Dr. provided.  Return here as needed

## 2017-08-08 NOTE — ED Notes (Signed)
Pt removed her own IV, unwilling to wait for d/c papers, pt seen walking down hall towards waiting area with steady gait

## 2017-08-08 NOTE — ED Triage Notes (Signed)
Per Pt, Pt is coming from home with lightheadedness for the last two weeks. Pt reports walking today and feeling like she couldn't get her balance. Pt took BP and reports Systolic greater than 878. Pt is alert and oriented x4 with no neuro deficits noted upon evaluation. Complains of some nausea, but denies CP or SOB.

## 2017-08-09 ENCOUNTER — Telehealth: Payer: Self-pay | Admitting: Family Medicine

## 2017-08-09 DIAGNOSIS — R42 Dizziness and giddiness: Secondary | ICD-10-CM

## 2017-08-09 NOTE — Telephone Encounter (Signed)
Pt is calling because she was seen in the ER over the weekend and told to call her PCP to get a referral to an ENT. Please let patient know when this is done. jw

## 2017-08-09 NOTE — ED Provider Notes (Signed)
[place] DEPT Provider Note   CSN: [id number] Arrival date & time: 08/08/17  1710     History   Chief Complaint Chief Complaint  Patient presents with  . Dizziness    HPI Erica Morris is a 71 y.o. female.  HPI Patient presents to the emergency department with dizziness that she states is worse over the last couple weeks.  She states that she chronically has this issue.  She states she is on meclizine but does not feel it is helping.  He should states that she is able to ambulate without any difficulty.  She states that she has no other symptoms.  She states the dizziness occurs mainly when she moves her head.  She states when she eats and will also occurThe patient denies chest pain, shortness of breath, headache,blurred vision, neck pain, fever, cough, weakness, numbness,  anorexia, edema, abdominal pain, nausea, vomiting, diarrhea, rash, back pain, dysuria, hematemesis, bloody stool, near syncope, or syncope. Past Medical History:  Diagnosis Date  . ABDOMINAL PAIN, RECURRENT 03/29/2008  . ANXIETY 10/07/2007  . Candidiasis of mouth 05/30/2010  . GERD 07/25/2007  . Hernia, hiatal   . HYPERTENSION 07/25/2007  . Stroke [facility])     Patient Active Problem List   Diagnosis Date Noted  . Candidiasis, intertrigo 04/02/2017  . Diastolic CHF ([place]) 38/18/2993  . 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 Hypothyroidism 11/17/2016  . Episode of recurrent major depressive disorder ([place])   . History of CVA (cerebrovascular accident)   . Pure hypercholesterolemia 11/06/2016  . Chronic venous insufficiency 11/05/2016  . Peripheral neuropathy 11/05/2016  . Cerebrovascular disease 06/09/2016  . Orthostatic hypotension 06/09/2016  . Hypokalemia 02/20/2015  . History of colonic polyps 06/04/2014  . Depression 03/29/2014  . Chronic constipation 03/29/2014  . ABDOMINAL PAIN, RECURRENT 03/29/2008  . Anxiety state 10/07/2007  . Essential hypertension  07/25/2007  . GERD 07/25/2007    Past Surgical History:  Procedure Laterality Date  . ABDOMINAL HYSTERECTOMY    . BREAST SURGERY     bx  . KNEE ARTHROSCOPY      OB History    No data available       Home Medications    Prior to Admission medications   Medication Sig Start Date End Date Taking? Authorizing Provider  albuterol (PROVENTIL HFA;VENTOLIN HFA) 108 (90 Base) MCG/ACT inhaler Inhale 2 puffs into the lungs every 4 (four) hours as needed for wheezing or shortness of breath.    [provider]  aspirin EC 81 MG tablet Take 81 mg by mouth every morning.     [provider]  atorvastatin (LIPITOR) 40 MG tablet Take 1 tablet (40 mg total) by mouth daily. 01/11/17   Dickie La, MD  doxepin (SINEQUAN) 25 MG capsule Take 1 capsule (25 mg total) by mouth at bedtime. 07/29/17   Zenia Resides, MD  furosemide (LASIX) 20 MG tablet Take 1 tablet (20 mg total) by mouth daily. 03/10/17   Zenia Resides, MD  ketoconazole (NIZORAL) 2 % cream Apply 1 application topically daily. 04/02/17   Bufford Lope, DO  levothyroxine (SYNTHROID, LEVOTHROID) 25 MCG tablet TAKE 1 TABLET BY MOUTH  DAILY BEFORE BREAKFAST 05/12/17   Zenia Resides, MD  meclizine (ANTIVERT) 12.5 MG tablet Take 1 tablet (12.5 mg total) by mouth 3 (three) times daily as needed for dizziness. 08/05/17   Zenia Resides, MD  pantoprazole (PROTONIX) 40 MG tablet TAKE 1 TABLET(40 MG) BY MOUTH DAILY  07/19/17   Zenia Resides, MD  triamcinolone ointment (KENALOG) 0.1 % Apply 1 application topically 2 (two) times daily. 01/14/17   Zenia Resides, MD    Family History Family History  Problem Relation Age of Onset  . Kidney disease Neg Hx   . Stroke Neg Hx   . Diabetes Neg Hx     Social History Social History  Substance Use Topics  . Smoking status: Never Smoker  . Smokeless tobacco: Never Used  . Alcohol use Yes     Comment: occasional     Allergies   Lisinopril; Codeine phosphate; and Morphine  and related   Review of Systems Review of Systems  All other systems negative except as documented in the HPI. All pertinent positives and negatives as reviewed in the HPI. Physical Exam Updated Vital Signs BP (!) 171/95   Pulse 76   Temp 98.3 F (36.8 C) (Oral)   Resp 16   Ht 5\' 4"  (1.626 m)   Wt 90.7 kg (200 lb)   SpO2 100%   BMI 34.33 kg/m   Physical Exam  Constitutional: She is oriented to person, place, and time. She appears well-developed and well-nourished. No distress.  HENT:  Head: Normocephalic and atraumatic.  Mouth/Throat: Oropharynx is clear and moist.  Eyes: Pupils are equal, round, and reactive to light.  Neck: Normal range of motion. Neck supple.  Cardiovascular: Normal rate, regular rhythm and normal heart sounds.  Exam reveals no gallop and no friction rub.   No murmur heard. Pulmonary/Chest: Effort normal and breath sounds normal. No respiratory distress. She has no wheezes.  Abdominal: Soft. Bowel sounds are normal. She exhibits no distension. There is no tenderness.  Neurological: She is alert and oriented to person, place, and time. She exhibits normal muscle tone. Coordination normal.  Skin: Skin is warm and dry. Capillary refill takes less than 2 seconds. No rash noted. No erythema.  Psychiatric: She has a normal mood and affect. Her behavior is normal.  Nursing note and vitals reviewed.    ED Treatments / Results  Labs (all labs ordered are listed, but only abnormal results are displayed) Labs Reviewed  BASIC METABOLIC PANEL - Abnormal; Notable for the following:       Result Value   Glucose, Bld 106 (*)    Creatinine, Ser 1.27 (*)    GFR calc non Af Amer 42 (*)    GFR calc Af Amer 48 (*)    All other components within normal limits  CBC - Abnormal; Notable for the following:    Hemoglobin 11.9 (*)    All other components within normal limits  URINALYSIS, ROUTINE W REFLEX MICROSCOPIC - Abnormal; Notable for the following:    Squamous  Epithelial / LPF 0-5 (*)    All other components within normal limits  ETHANOL - Abnormal; Notable for the following:    Alcohol, Ethyl (B) 6 (*)    All other components within normal limits    EKG  EKG Interpretation  Date/Time:  Sunday August 08 2017 17:14:44 EDT Ventricular Rate:  80 PR Interval:  128 QRS Duration: 80 QT Interval:  368 QTC Calculation: 424 R Axis:   36 Text Interpretation:  Normal sinus rhythm Normal ECG No STEMI.  Confirmed by Nanda Quinton [phone]) on 08/08/2017 6:00:18 PM       Radiology Ct Head Wo Contrast  Result Date: 08/08/2017 CLINICAL DATA:  71 year old female with history of vertigo. EXAM: CT HEAD WITHOUT CONTRAST TECHNIQUE: Contiguous axial images  were obtained from the base of the skull through the vertex without intravenous contrast. COMPARISON:  Head CT 11/07/2016. FINDINGS: Brain: Patchy and confluent areas of decreased attenuation are noted throughout the deep and periventricular white matter of the cerebral hemispheres bilaterally, compatible with chronic microvascular ischemic disease. No evidence of acute infarction, hemorrhage, hydrocephalus, extra-axial collection or mass lesion/mass effect. Vascular: No hyperdense vessel or unexpected calcification. Skull: Normal. Negative for fracture or focal lesion. Sinuses/Orbits: No acute finding. Other: None. IMPRESSION: 1. No acute intracranial abnormality. 2. Mild chronic microvascular ischemic changes in the cerebral white matter, as above. Electronically Signed   By: Vinnie Langton M.D.   On: 08/08/2017 19:33    Procedures Procedures (including critical care time)  Medications Ordered in ED Medications  sodium chloride 0.9 % bolus 500 mL (0 mLs Intravenous Stopped 08/08/17 2022)     Initial Impression / Assessment and Plan / ED Course  I have reviewed the triage vital signs and the nursing notes.  Pertinent labs & imaging results that were available during my care of the patient were reviewed  by me and considered in my medical decision making (see chart for details).     This is a chronic problem.  The patient subjectively states is worsened over the last couple of weeks.  She states that she cannot pinpoint what is worse about the condition.  She states that she has taken her meclizine but does not feel it is helping.  The patient did not talk to her primary care doctor about these issues.  Patient is advised she will need to follow up with ENT.  Told to return here as needed.  Patient agrees the plan.  All questions were answered  Final Clinical Impressions(s) / ED Diagnoses   Final diagnoses:  Benign paroxysmal positional vertigo due to bilateral vestibular disorder    New Prescriptions Discharge Medication List as of 08/08/2017  8:46 PM       Dalia Heading, PA-C 08/09/17 0030    Long, Wonda Olds, MD 08/09/17 1050

## 2017-08-10 DIAGNOSIS — R42 Dizziness and giddiness: Secondary | ICD-10-CM | POA: Insufficient documentation

## 2017-08-10 HISTORY — DX: Dizziness and giddiness: R42

## 2017-08-10 NOTE — Telephone Encounter (Signed)
Patient made aware that she will receive call from Referral specialist or directly from neurovestibular rehab to schedule appt. Hubbard Hartshorn, RN, BSN

## 2017-08-10 NOTE — Telephone Encounter (Signed)
She was in ER for dizziness - a long standing refractory problem for her.  While she has not seen ENT or neuro by my review, she has had extensive testing which includes, CT angio of neck 05/18/16 MRI of brain 03/23/16 I do not feel that she needs further WU or consultation.  Focus on treatment.  Will refer back to neurovestibular rehab.

## 2017-08-18 ENCOUNTER — Encounter: Payer: Self-pay | Admitting: *Deleted

## 2017-08-18 ENCOUNTER — Ambulatory Visit (INDEPENDENT_AMBULATORY_CARE_PROVIDER_SITE_OTHER): Payer: Medicare Other | Admitting: *Deleted

## 2017-08-18 ENCOUNTER — Telehealth: Payer: Self-pay | Admitting: Licensed Clinical Social Worker

## 2017-08-18 VITALS — BP 156/82 | HR 73 | Temp 98.2°F | Ht 64.0 in | Wt 201.0 lb

## 2017-08-18 DIAGNOSIS — Z23 Encounter for immunization: Secondary | ICD-10-CM

## 2017-08-18 DIAGNOSIS — Z0000 Encounter for general adult medical examination without abnormal findings: Secondary | ICD-10-CM

## 2017-08-18 DIAGNOSIS — E2839 Other primary ovarian failure: Secondary | ICD-10-CM | POA: Diagnosis not present

## 2017-08-18 MED ORDER — TETANUS-DIPHTH-ACELL PERTUSSIS 5-2.5-18.5 LF-MCG/0.5 IM SUSP: 0.5000 mL | Freq: Once | INTRAMUSCULAR | 0 refills | Status: AC

## 2017-08-18 MED ORDER — TETANUS-DIPHTH-ACELL PERTUSSIS 5-2.5-18.5 LF-MCG/0.5 IM SUSP: 0.5000 mL | Freq: Once | INTRAMUSCULAR | Status: DC

## 2017-08-18 MED ORDER — TETANUS-DIPHTH-ACELL PERTUSSIS 5-2.5-18.5 LF-MCG/0.5 IM SUSP
0.5000 mL | Freq: Once | INTRAMUSCULAR | 0 refills | Status: DC
Start: 2017-08-18 — End: 2017-08-18

## 2017-08-18 NOTE — Patient Instructions (Addendum)
[name],  Thank you for taking time to come for yourMedicare Wellness Visit. I appreciate your ongoing commitment to your health goals. Please review the following plan we discussed and let me know if I can assist you in the future.   These are the goals we discussed:  Goals    . Blood Pressure < 150/90       Fall Prevention in the Home Falls can cause injuries. They can happen to people of all ages. There are many things you can do to make your home safe and to help prevent falls. What can I do on the outside of my home?  Regularly fix the edges of walkways and driveways and fix any cracks.  Remove anything that might make you trip as you walk through a door, such as a raised step or threshold.  Trim any bushes or trees on the path to your home.  Use bright outdoor lighting.  Clear any walking paths of anything that might make someone trip, such as rocks or tools.  Regularly check to see if handrails are loose or broken. Make sure that both sides of any steps have handrails.  Any raised decks and porches should have guardrails on the edges.  Have any leaves, snow, or ice cleared regularly.  Use sand or salt on walking paths during winter.  Clean up any spills in your garage right away. This includes oil or grease spills. What can I do in the bathroom?  Use night lights.  Install grab bars by the toilet and in the tub and shower. Do not use towel bars as grab bars.  Use non-skid mats or decals in the tub or shower.  If you need to sit down in the shower, use a plastic, non-slip stool.  Keep the floor dry. Clean up any water that spills on the floor as soon as it happens.  Remove soap buildup in the tub or shower regularly.  Attach bath mats securely with double-sided non-slip rug tape.  Do not have throw rugs and other things on the floor that can make you trip. What can I do in the bedroom?  Use night lights.  Make sure that you have a light by your bed  that is easy to reach.  Do not use any sheets or blankets that are too big for your bed. They should not hang down onto the floor.  Have a firm chair that has side arms. You can use this for support while you get dressed.  Do not have throw rugs and other things on the floor that can make you trip. What can I do in the kitchen?  Clean up any spills right away.  Avoid walking on wet floors.  Keep items that you use a lot in easy-to-reach places.  If you need to reach something above you, use a strong step stool that has a grab bar.  Keep electrical cords out of the way.  Do not use floor polish or wax that makes floors slippery. If you must use wax, use non-skid floor wax.  Do not have throw rugs and other things on the floor that can make you trip. What can I do with my stairs?  Do not leave any items on the stairs.  Make sure that there are handrails on both sides of the stairs and use them. Fix handrails that are broken or loose. Make sure that handrails are as long as the stairways.  Check any carpeting to make sure that  it is firmly attached to the stairs. Fix any carpet that is loose or worn.  Avoid having throw rugs at the top or bottom of the stairs. If you do have throw rugs, attach them to the floor with carpet tape.  Make sure that you have a light switch at the top of the stairs and the bottom of the stairs. If you do not have them, ask someone to add them for you. What else can I do to help prevent falls?  Wear shoes that: ? Do not have high heels. ? Have rubber bottoms. ? Are comfortable and fit you well. ? Are closed at the toe. Do not wear sandals.  If you use a stepladder: ? Make sure that it is fully opened. Do not climb a closed stepladder. ? Make sure that both sides of the stepladder are locked into place. ? Ask someone to hold it for you, if possible.  Clearly mark and make sure that you can see: ? Any grab bars or handrails. ? First and last  steps. ? Where the edge of each step is.  Use tools that help you move around (mobility aids) if they are needed. These include: ? Canes. ? Walkers. ? Scooters. ? Crutches.  Turn on the lights when you go into a dark area. Replace any light bulbs as soon as they burn out.  Set up your furniture so you have a clear path. Avoid moving your furniture around.  If any of your floors are uneven, fix them.  If there are any pets around you, be aware of where they are.  Review your medicines with your doctor. Some medicines can make you feel dizzy. This can increase your chance of falling. Ask your doctor what other things that you can do to help prevent falls. This information is not intended to replace advice given to you by your health care provider. Make sure you discuss any questions you have with your health care provider. Document Released: 09/12/2009 Document Revised: 04/23/2016 Document Reviewed: 12/21/2014 Elsevier Interactive Patient Education  2018 [place] Maintenance, Female Adopting a healthy lifestyle and getting preventive care can go a long way to promote health and wellness. Talk with your health care provider about what schedule of regular examinations is right for you. This is a good chance for you to check in with your provider about disease prevention and staying healthy. In between checkups, there are plenty of things you can do on your own. Experts have done a lot of research about which lifestyle changes and preventive measures are most likely to keep you healthy. Ask your health care provider for more information. Weight and diet Eat a healthy diet  Be sure to include plenty of vegetables, fruits, low-fat dairy products, and lean protein.  Do not eat a lot of foods high in solid fats, added sugars, or salt.  Get regular exercise. This is one of the most important things you can do for your health. ? Most adults should exercise for at least 150  minutes each week. The exercise should increase your heart rate and make you sweat (moderate-intensity exercise). ? Most adults should also do strengthening exercises at least twice a week. This is in addition to the moderate-intensity exercise.  Maintain a healthy weight  Body mass index (BMI) is a measurement that can be used to identify possible weight problems. It estimates body fat based on height and weight. Your health care provider can help determine your BMI and  help you achieve or maintain a healthy weight.  For females 30 years of age and older: ? A BMI below 18.5 is considered underweight. ? A BMI of 18.5 to 24.9 is normal. ? A BMI of 25 to 29.9 is considered overweight. ? A BMI of 30 and above is considered obese.  Watch levels of cholesterol and blood lipids  You should start having your blood tested for lipids and cholesterol at 70 years of age, then have this test every 5 years.  You may need to have your cholesterol levels checked more often if: ? Your lipid or cholesterol levels are high. ? You are older than 70 years of age. ? You are at high risk for heart disease.  Cancer screening Lung Cancer  Lung cancer screening is recommended for adults 28-4 years old who are at high risk for lung cancer because of a history of smoking.  A yearly low-dose CT scan of the lungs is recommended for people who: ? Currently smoke. ? Have quit within the past 15 years. ? Have at least a 30-pack-year history of smoking. A pack year is smoking an average of one pack of cigarettes a day for 1 year.  Yearly screening should continue until it has been 15 years since you quit.  Yearly screening should stop if you develop a health problem that would prevent you from having lung cancer treatment.  Breast Cancer  Practice breast self-awareness. This means understanding how your breasts normally appear and feel.  It also means doing regular breast self-exams. Let your health care  provider know about any changes, no matter how small.  If you are in your 20s or 30s, you should have a clinical breast exam (CBE) by a health care provider every 1-3 years as part of a regular health exam.  If you are 2 or older, have a CBE every year. Also consider having a breast X-ray (mammogram) every year.  If you have a family history of breast cancer, talk to your health care provider about genetic screening.  If you are at high risk for breast cancer, talk to your health care provider about having an MRI and a mammogram every year.  Breast cancer gene (BRCA) assessment is recommended for women who have family members with BRCA-related cancers. BRCA-related cancers include: ? Breast. ? Ovarian. ? Tubal. ? Peritoneal cancers.  Results of the assessment will determine the need for genetic counseling and BRCA1 and BRCA2 testing.  Cervical Cancer Your health care provider may recommend that you be screened regularly for cancer of the pelvic organs (ovaries, uterus, and vagina). This screening involves a pelvic examination, including checking for microscopic changes to the surface of your cervix (Pap test). You may be encouraged to have this screening done every 3 years, beginning at age 29.  For women ages 6-65, health care providers may recommend pelvic exams and Pap testing every 3 years, or they may recommend the Pap and pelvic exam, combined with testing for human papilloma virus (HPV), every 5 years. Some types of HPV increase your risk of cervical cancer. Testing for HPV may also be done on women of any age with unclear Pap test results.  Other health care providers may not recommend any screening for nonpregnant women who are considered low risk for pelvic cancer and who do not have symptoms. Ask your health care provider if a screening pelvic exam is right for you.  If you have had past treatment for cervical cancer or a condition  that could lead to cancer, you need Pap tests  and screening for cancer for at least 20 years after your treatment. If Pap tests have been discontinued, your risk factors (such as having a new sexual partner) need to be reassessed to determine if screening should resume. Some women have medical problems that increase the chance of getting cervical cancer. In these cases, your health care provider may recommend more frequent screening and Pap tests.  Colorectal Cancer  This type of cancer can be detected and often prevented.  Routine colorectal cancer screening usually begins at 70 years of age and continues through 70 years of age.  Your health care provider may recommend screening at an earlier age if you have risk factors for colon cancer.  Your health care provider may also recommend using home test kits to check for hidden blood in the stool.  A small camera at the end of a tube can be used to examine your colon directly (sigmoidoscopy or colonoscopy). This is done to check for the earliest forms of colorectal cancer.  Routine screening usually begins at age 44.  Direct examination of the colon should be repeated every 5-10 years through 70 years of age. However, you may need to be screened more often if early forms of precancerous polyps or small growths are found.  Skin Cancer  Check your skin from head to toe regularly.  Tell your health care provider about any new moles or changes in moles, especially if there is a change in a mole's shape or color.  Also tell your health care provider if you have a mole that is larger than the size of a pencil eraser.  Always use sunscreen. Apply sunscreen liberally and repeatedly throughout the day.  Protect yourself by wearing long sleeves, pants, a wide-brimmed hat, and sunglasses whenever you are outside.  Heart disease, diabetes, and high blood pressure  High blood pressure causes heart disease and increases the risk of stroke. High blood pressure is more likely to develop  in: ? People who have blood pressure in the high end of the normal range (130-139/85-89 mm Hg). ? People who are overweight or obese. ? People who are African American.  If you are 11-45 years of age, have your blood pressure checked every 3-5 years. If you are 2 years of age or older, have your blood pressure checked every year. You should have your blood pressure measured twice-once when you are at a hospital or clinic, and once when you are not at a hospital or clinic. Record the average of the two measurements. To check your blood pressure when you are not at a hospital or clinic, you can use: ? An automated blood pressure machine at a pharmacy. ? A home blood pressure monitor.  If you are between 95 years and 68 years old, ask your health care provider if you should take aspirin to prevent strokes.  Have regular diabetes screenings. This involves taking a blood sample to check your fasting blood sugar level. ? If you are at a normal weight and have a low risk for diabetes, have this test once every three years after 70 years of age. ? If you are overweight and have a high risk for diabetes, consider being tested at a younger age or more often. Preventing infection Hepatitis B  If you have a higher risk for hepatitis B, you should be screened for this virus. You are considered at high risk for hepatitis B if: ? You were  born in a country where hepatitis B is common. Ask your health care provider which countries are considered high risk. ? Your parents were born in a high-risk country, and you have not been immunized against hepatitis B (hepatitis B vaccine). ? You have HIV or AIDS. ? You use needles to inject street drugs. ? You live with someone who has hepatitis B. ? You have had sex with someone who has hepatitis B. ? You get hemodialysis treatment. ? You take certain medicines for conditions, including cancer, organ transplantation, and autoimmune conditions.  Hepatitis C  Blood  testing is recommended for: ? Everyone born from [birth year] through 1964. ? Anyone with known risk factors for hepatitis C.  Sexually transmitted infections (STIs)  You should be screened for sexually transmitted infections (STIs) including gonorrhea and chlamydia if: ? You are sexually active and are younger than 70 years of age. ? You are older than 70 years of age and your health care provider tells you that you are at risk for this type of infection. ? Your sexual activity has changed since you were last screened and you are at an increased risk for chlamydia or gonorrhea. Ask your health care provider if you are at risk.  If you do not have HIV, but are at risk, it may be recommended that you take a prescription medicine daily to prevent HIV infection. This is called pre-exposure prophylaxis (PrEP). You are considered at risk if: ? You are sexually active and do not regularly use condoms or know the HIV status of your partner(s). ? You take drugs by injection. ? You are sexually active with a partner who has HIV.  Talk with your health care provider about whether you are at high risk of being infected with HIV. If you choose to begin PrEP, you should first be tested for HIV. You should then be tested every 3 months for as long as you are taking PrEP. Pregnancy  If you are premenopausal and you may become pregnant, ask your health care provider about preconception counseling.  If you may become pregnant, take 400 to 800 micrograms (mcg) of folic acid every day.  If you want to prevent pregnancy, talk to your health care provider about birth control (contraception). Osteoporosis and menopause  Osteoporosis is a disease in which the bones lose minerals and strength with aging. This can result in serious bone fractures. Your risk for osteoporosis can be identified using a bone density scan.  If you are 19 years of age or older, or if you are at risk for osteoporosis and fractures, ask your  health care provider if you should be screened.  Ask your health care provider whether you should take a calcium or vitamin D supplement to lower your risk for osteoporosis.  Menopause may have certain physical symptoms and risks.  Hormone replacement therapy may reduce some of these symptoms and risks. Talk to your health care provider about whether hormone replacement therapy is right for you. Follow these instructions at home:  Schedule regular health, dental, and eye exams.  Stay current with your immunizations.  Do not use any tobacco products including cigarettes, chewing tobacco, or electronic cigarettes.  If you are pregnant, do not drink alcohol.  If you are breastfeeding, limit how much and how often you drink alcohol.  Limit alcohol intake to no more than 1 drink per day for nonpregnant women. One drink equals 12 ounces of beer, 5 ounces of wine, or 1 ounces of hard liquor.  Do not use street drugs.  Do not share needles.  Ask your health care provider for help if you need support or information about quitting drugs.  Tell your health care provider if you often feel depressed.  Tell your health care provider if you have ever been abused or do not feel safe at home. This information is not intended to replace advice given to you by your health care provider. Make sure you discuss any questions you have with your health care provider. Document Released: 06/01/2011 Document Revised: 04/23/2016 Document Reviewed: 08/20/2015 Elsevier Interactive Patient Education  2018 [employer].   Hearing Loss Hearing loss is a partial or total loss of the ability to hear. This can be temporary or permanent, and it can happen in one or both ears. Hearing loss may be referred to as deafness. Medical care is necessary to treat hearing loss properly and to prevent the condition from getting worse. Your hearing may partially or completely come back, depending on what caused your hearing  loss and how severe it is. In some cases, hearing loss is permanent. What are the causes? Common causes of hearing loss include:  Too much wax in the ear canal.  Infection of the ear canal or middle ear.  Fluid in the middle ear.  Injury to the ear or surrounding area.  An object stuck in the ear.  Prolonged exposure to loud sounds, such as music.  Less common causes of hearing loss include:  Tumors in the ear.  Viral or bacterial infections, such as meningitis.  A hole in the eardrum (perforated eardrum).  Problems with the hearing nerve that sends signals between the brain and the ear.  Certain medicines.  What are the signs or symptoms? Symptoms of this condition may include:  Difficulty telling the difference between sounds.  Difficulty following a conversation when there is background noise.  Lack of response to sounds in your environment. This may be most noticeable when you do not respond to startling sounds.  Needing to turn up the volume on the television, radio, etc.  Ringing in the ears.  Dizziness.  Pain in the ears.  How is this diagnosed? This condition is diagnosed based on a physical exam and a hearing test (audiometry). The audiometry test will be performed by a hearing specialist (audiologist). You may also be referred to an ear, nose, and throat (ENT) specialist (otolaryngologist). How is this treated? Treatment for recent onset of hearing loss may include:  Ear wax removal.  Being prescribed medicines to prevent infection (antibiotics).  Being prescribed medicines to reduce inflammation (corticosteroids).  Follow these instructions at home:  If you were prescribed an antibiotic medicine, take it as told by your health care provider. Do not stop taking the antibiotic even if you start to feel better.  Take over-the-counter and prescription medicines only as told by your health care provider.  Avoid loud noises.  Return to your normal  activities as told by your health care provider. Ask your health care provider what activities are safe for you.  Keep all follow-up visits as told by your health care provider. This is important. Contact a health care provider if:  You feel dizzy.  You develop new symptoms.  You vomit or feel nauseous.  You have a fever. Get help right away if:  You develop sudden changes in your vision.  You have severe ear pain.  You have new or increased weakness.  You have a severe headache. This information is  not intended to replace advice given to you by your health care provider. Make sure you discuss any questions you have with your health care provider. Document Released: 11/16/2005 Document Revised: 04/23/2016 Document Reviewed: 04/03/2015 Elsevier Interactive Patient Education  2018 [employer].

## 2017-08-18 NOTE — Progress Notes (Signed)
I have reviewed this visit and discussed with Lauren Ducatte, RN, BSN, and agree with her documentation.   

## 2017-08-18 NOTE — Progress Notes (Signed)
Social work consult from [place], Dispensing optician food insecurities for patient.    LCSW called patient to assess needs for the above consult.  Patient lives with her daughter and granddaughter.  Per patient her food and nutrition alotment  have been reduced and she feels bad eating her daughter's food.  The following was discussed;current support system, contacting food and Research scientist (life sciences), community resources/ support with senior resources food program.  Intervention: Reflective listening, Data processing manager    Plan:  1. Patient will call her food and nutrition worker to inquire about the reduction of her benefits 2. Patient will also call [employer] [phone] to inquire about meals on wheels and other food programs 3. Patient will call LCSW if additional resources are needed.  Casimer Lanius, LCSW Licensed Clinical Social Worker [facility]   [phone] 2:05 PM

## 2017-08-18 NOTE — Progress Notes (Signed)
Subjective:   Erica Morris is a 71 y.o. female who presents for an Initial Medicare Annual Wellness Visit.   Cardiac Risk Factors include: advanced age (>79men, >72 women);dyslipidemia;hypertension;obesity (BMI >30kg/m2)     Objective:    Today's Vitals   08/18/17 1008 08/18/17 1009  BP:  (!) 156/82  Pulse:  73  Temp:  98.2 F (36.8 C)  TempSrc:  Oral  SpO2:  97%  Weight: 201 lb (91.2 kg)   Height: 5\' 4"  (1.626 m)   PainSc: 7  7   PainLoc: Ear    Body mass index is 34.5 kg/m.  Patient had to leave for [employer] transportation prior to being able to recheck BP.  Current Medications (verified) Outpatient Encounter Prescriptions as of 08/18/2017  Medication Sig  . aspirin EC 81 MG tablet Take 81 mg by mouth every morning.   Marland Kitchen atorvastatin (LIPITOR) 40 MG tablet Take 1 tablet (40 mg total) by mouth daily.  Marland Kitchen doxepin (SINEQUAN) 25 MG capsule Take 1 capsule (25 mg total) by mouth at bedtime.  . furosemide (LASIX) 20 MG tablet Take 1 tablet (20 mg total) by mouth daily.  Marland Kitchen levothyroxine (SYNTHROID, LEVOTHROID) 25 MCG tablet TAKE 1 TABLET BY MOUTH  DAILY BEFORE BREAKFAST  . pantoprazole (PROTONIX) 40 MG tablet TAKE 1 TABLET(40 MG) BY MOUTH DAILY  . albuterol (PROVENTIL HFA;VENTOLIN HFA) 108 (90 Base) MCG/ACT inhaler Inhale 2 puffs into the lungs every 4 (four) hours as needed for wheezing or shortness of breath.  . ketoconazole (NIZORAL) 2 % cream Apply 1 application topically daily. (Patient not taking: Reported on 08/18/2017)  . meclizine (ANTIVERT) 12.5 MG tablet Take 1 tablet (12.5 mg total) by mouth 3 (three) times daily as needed for dizziness. (Patient not taking: Reported on 08/18/2017)  . triamcinolone ointment (KENALOG) 0.1 % Apply 1 application topically 2 (two) times daily. (Patient not taking: Reported on 08/18/2017)   No facility-administered encounter medications on file as of 08/18/2017.     Allergies (verified) Lisinopril; Codeine phosphate; and Morphine and  related   History: Past Medical History:  Diagnosis Date  . ABDOMINAL PAIN, RECURRENT 03/29/2008  . ANXIETY 10/07/2007  . Candidiasis of mouth 05/30/2010  . GERD 07/25/2007  . Hernia, hiatal   . HYPERTENSION 07/25/2007  . Stroke [facility])    Past Surgical History:  Procedure Laterality Date  . ABDOMINAL HYSTERECTOMY    . BREAST SURGERY     bx  . KNEE ARTHROSCOPY     Family History  Problem Relation Age of Onset  . Dementia Mother   . COPD Father   . Diabetes Sister   . Hypertension Sister   . Cancer Brother        Lung CA  . Clotting disorder Brother   . Kidney disease Neg Hx   . Stroke Neg Hx    Social History   Occupational History  . Not on file.   Social History Main Topics  . Smoking status: Never Smoker  . Smokeless tobacco: Never Used  . Alcohol use Yes     Comment: occasional  . Drug use: No  . Sexual activity: No    Tobacco Counseling Counseling given: Yes Patient has never smoked and has no plans to start.   Activities of Daily Living In your present state of health, do you have any difficulty performing the following activities: 08/18/2017 11/10/2016  Hearing? N N  Vision? Y N  Difficulty concentrating or making decisions? N Y  Walking or  climbing stairs? Y Y  Dressing or bathing? N N  Doing errands, shopping? N Y  Conservation officer, nature and eating ? N Y  Using the Toilet? N N  In the past six months, have you accidently leaked urine? Y Y  Do you have problems with loss of bowel control? N N  Managing your Medications? N N  Managing your Finances? N N  Housekeeping or managing your Housekeeping? N Y  Some recent data might be hidden   Home Safety:  My home has a working smoke alarm:  Yes X 1           My home throw rugs have been fastened down to the floor or removed:  Non-slip backs I have a non-slip surface or non-slip mats in the bathtub and shower:  Non-slip surface        All my home's stairs have handrails, including any outdoor stairs  One level  home with 2 outside steps without handrails          My home's floors, stairs and hallways are free from clutter, wires and cords:  Yes     I have animals in my home  Yes, a dog, a cat and two hamsters I wear seatbelts consistently:  Yes   Immunizations and Health Maintenance Immunization History  Administered Date(s) Administered  . Influenza Whole 10/07/2007  . Influenza, High Dose Seasonal PF 08/07/2016  . Influenza,inj,Quad PF,6+ Mos 07/29/2017  . Influenza-Unspecified 08/30/2014  . Pneumococcal Conjugate-13 11/05/2016  . Pneumococcal Polysaccharide-23 08/30/1998, 10/07/2007  . Td 04/30/2001   Health Maintenance Due  Topic Date Due  . TETANUS/TDAP  05/01/2011  . DEXA SCAN  08/05/2012   Dexa scan ordered Order for TDAP faxed to [facility] at [phone]  Patient Care Team: Zenia Resides, MD as PCP - General (Family Medicine)  Indicate any recent Medical Services you may have received from other than [facility] providers in the past year (date may be approximate).     Assessment:   This is a routine wellness examination for Erica Morris.   Hearing/Vision screen  Hearing Screening   Method: Audiometry   125Hz  250Hz  500Hz  1000Hz  2000Hz  3000Hz  4000Hz  6000Hz  8000Hz   Right ear:   Fail Fail 40  Fail    Left ear:   Fail Fail 40  Fail      Dietary issues and exercise activities discussed: Current Exercise Habits: Home exercise routine, Time (Minutes): 15, Frequency (Times/Week): 7, Weekly Exercise (Minutes/Week): 105, Intensity: Moderate, Exercise limited by: None identified  Goals    . Blood Pressure < 150/90      Depression Screen PHQ 2/9 Scores 08/18/2017 07/29/2017 04/02/2017 03/10/2017 01/12/2017 01/06/2017 12/15/2016  PHQ - 2 Score 0 0 0 2 0 0 0  PHQ- 9 Score - - - - - - -    Fall Risk Fall Risk  08/18/2017 07/29/2017 04/02/2017 03/10/2017 01/06/2017  Falls in the past year? No No Yes Yes No  Number falls in past yr: - - 1 - -  Injury with Fall? - - - - -  Risk Factor Category  - - - - -   Risk for fall due to : - - - - -  Follow up - - - - -  TUG Test:  Done in 14 seconds. Patient used both hands to push out of chair and one hand to sit back down. Patient wearing flip flops. Discussed wearing closed, well-fitting shoes to decrease risk of fall. Falls prevention discussed in detail and literature  given.  Cognitive Function: Mini-Cog  Passed with score 3/5  Screening Tests Health Maintenance  Topic Date Due  . TETANUS/TDAP  05/01/2011  . DEXA SCAN  08/05/2012  . PNA vac Low Risk Adult (2 of 2 - PPSV23) 11/05/2017  . MAMMOGRAM  07/10/2018  . COLONOSCOPY  04/06/2019  . INFLUENZA VACCINE  Completed  . Hepatitis C Screening  Completed      Plan:   Patient to have Dexa scan at [place] Patient [facility] Patient has not heard from [facility] to schedule appt. Contact info provided to patient by Referral Specialist.  Patient sometimes worried that there is not enough food in the home. Relies on [employer] transportation so not able to go to local food pantries or meals around town. In-house LCSW to contact patient to assess needs and resources.  I have personally reviewed and noted the following in the patient's chart:   . Medical and social history . Use of alcohol, tobacco or illicit drugs  . Current medications and supplements . Functional ability and status . Nutritional status . Physical activity . Advanced directives . List of other physicians . Hospitalizations, surgeries, and ER visits in previous 12 months . Vitals . Screenings to include cognitive, depression, and falls . Referrals and appointments  In addition, I have reviewed and discussed with patient certain preventive protocols, quality metrics, and best practice recommendations. A written personalized care plan for preventive services as well as general preventive health recommendations were provided to patient.     Velora Heckler, RN   08/18/2017

## 2017-08-26 ENCOUNTER — Ambulatory Visit: Payer: Medicare Other | Attending: Family Medicine | Admitting: Physical Therapy

## 2017-08-26 DIAGNOSIS — H8111 Benign paroxysmal vertigo, right ear: Secondary | ICD-10-CM | POA: Diagnosis not present

## 2017-08-26 DIAGNOSIS — R2681 Unsteadiness on feet: Secondary | ICD-10-CM | POA: Diagnosis present

## 2017-08-26 DIAGNOSIS — R42 Dizziness and giddiness: Secondary | ICD-10-CM | POA: Diagnosis present

## 2017-08-26 NOTE — Therapy (Signed)
[facility] [address] [place], [place], [address] Phone: [phone]   Fax:  [phone]  Physical Therapy Evaluation  Patient Details  Name: Erica Morris MRN: [id number] Date of Birth: 1946-10-18 Referring Provider: Madison Hickman, MD  Encounter Date: 08/26/2017      PT End of Session - 08/26/17 0831    Visit Number 1   Number of Visits 6   Date for PT Re-Evaluation 10/07/17   Authorization Type UHC Medicare $35 copay (pt concerned about being able to afford)   PT Start Time 0757   PT Stop Time 0834   PT Time Calculation (min) 37 min   Activity Tolerance Patient tolerated treatment well   Behavior During Therapy [facility] for tasks assessed/performed      Past Medical History:  Diagnosis Date  . ABDOMINAL PAIN, RECURRENT 03/29/2008  . ANXIETY 10/07/2007  . Candidiasis of mouth 05/30/2010  . GERD 07/25/2007  . Hernia, hiatal   . HYPERTENSION 07/25/2007  . Stroke [facility])     Past Surgical History:  Procedure Laterality Date  . ABDOMINAL HYSTERECTOMY    . BREAST SURGERY     bx  . KNEE ARTHROSCOPY      There were no vitals filed for this visit.       Subjective Assessment - 08/26/17 0801    Subjective Pt is a 71 y/o female who presents to OPPT for dizziness.  Pt went to ED and dx with vertigo, but reports PCP said it wasn't vertigo.  Pt states 2 month hx of dizziness.  Pt states ED MD recommended ENT referral, but PCP doesn't feel this would be beneficial.   Patient Stated Goals improve dizziness   Pain Score 0-No pain            [facility] PT Assessment - 08/26/17 0803      Assessment   Medical Diagnosis vertigo   Referring Provider Madison Hickman, MD   Onset Date/Surgical Date --  2 months   Next MD Visit PRN   Prior Therapy none     Precautions   Precautions None     Restrictions   Weight Bearing Restrictions No     Balance Screen   Has the patient fallen in the past 6 months No   Has the  patient had a decrease in activity level because of a fear of falling?  Yes   Is the patient reluctant to leave their home because of a fear of falling?  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, walking     Cognition   Overall Cognitive Status Within Functional Limits for tasks assessed     Observation/Other Assessments   Focus on Therapeutic Outcomes (FOTO)  unable to complete            Vestibular Assessment - 08/26/17 0806      Vestibular Assessment   General Observation sititng at rest reports symptoms 8/10     Symptom Behavior   Type of Dizziness "Funny feeling in head"   Frequency of Dizziness "it don't go away"   Duration of Dizziness "it don't  go away"   Aggravating Factors Sit to stand;Turning head quickly;Turning head sideways   Relieving Factors No known relieving factors     Occulomotor Exam   Occulomotor Alignment Normal   Gaze-induced Absent   Smooth Pursuits Intact  increase in symptoms   Saccades Intact  increase in symptoms     Vestibulo-Occular Reflex   VOR 1 Head Only (x 1 viewing) WNL with increase in symptoms - pt requesting to stop     Positional Testing   Dix-Hallpike Dix-Hallpike Right;Dix-Hallpike Left   Horizontal Canal Testing Horizontal Canal Right;Horizontal Canal Left     Dix-Hallpike Right   Dix-Hallpike Right Duration 2-3 sec   Dix-Hallpike Right Symptoms Upbeat, right rotatory nystagmus     Dix-Hallpike Left   Dix-Hallpike Left Duration mild increase in symptoms   Dix-Hallpike Left Symptoms No nystagmus     Horizontal Canal Right   Horizontal Canal Right Duration none   Horizontal Canal Right Symptoms  Normal     Horizontal Canal Left   Horizontal Canal Left Duration none   Horizontal Canal Left Symptoms Normal     Orthostatics   BP supine (x 5 minutes) 137/80   HR supine (x 5 minutes) 68   BP standing (after 1 minute) 143/91   HR standing (after 1 minute) 85   BP standing (after 3 minutes) 150/91   HR standing (after 3 minutes) 80   Orthostatics Comment reports feeling "wobbly" upon standing up        Objective measurements completed on examination: See above findings.          [place] Adult PT Treatment/Exercise - 08/26/17 0001      Ambulation/Gait   Gait Comments no significant deviations noted but pt reports feeling  "drunk" when walking         Vestibular Treatment/Exercise - 08/26/17 0821      Vestibular Treatment/Exercise   Vestibular Treatment Provided Canalith Repositioning   Canalith Repositioning Epley Manuever Right      EPLEY MANUEVER RIGHT   Number of Reps  1   Overall Response Improved Symptoms               PT Education - 08/26/17 0831    Education provided Yes   Education Details BPPV   Person(s) Educated Patient   Methods Explanation   Comprehension Verbalized understanding             PT Long Term Goals - 08/26/17 0838      PT LONG TERM GOAL #1   Title independent with HEP   Status New   Target Date 10/07/17     PT LONG TERM GOAL #2   Title demonstrate negative positional testing   Status New   Target Date 10/07/17     PT LONG TERM GOAL #3   Title formal balance goals to follow PRN   Status New   Target Date 10/07/17                Plan - 08/26/17 0834    Clinical Impression Statement Pt is a 71 y/o female who presents to OPPT for vertigo.  Difficult to grasp full picture as pt with difficultly describing symptoms and duration, as well as currently taking meclizine 3x/day.  Feel symptoms are likely multifactorial including hypofunction, Rt BPPV and possible medication side effects.  Pt with 2-3 seconds of  nystagmus in Rt D-H so treated for BPPV today.  Orthostatics negative.  Will benefit from PT to address vertigo and imbalance  concerns.   History and Personal Factors relevant to plan of care: anxiety, HTN, CVA   Clinical Presentation Evolving   Clinical Presentation due to: inconsistent symptoms of vertigo   Clinical Decision Making Moderate   Rehab Potential Fair   PT Frequency 1x / week   PT Duration 6 weeks   PT Treatment/Interventions ADLs/Self Care Home Management;Canalith Repostioning;Neuromuscular re-education;Balance training;Therapeutic exercise;Therapeutic activities;Functional mobility training;Stair training;Gait training;Patient/family education;Vestibular   PT Next Visit Plan reassess canals and tx as indicated, formal balance testing, give gaze x 1 (instructed pt to stop taking meclizine if able)   Consulted and Agree with Plan of Care Patient      Patient will benefit from skilled therapeutic intervention in order to improve the following deficits and impairments:  Decreased balance, Dizziness, Decreased mobility  Visit Diagnosis: BPPV (benign paroxysmal positional vertigo), right - Plan: PT plan of care cert/re-cert  Dizziness and giddiness - Plan: PT plan of care cert/re-cert  Unsteadiness on feet - Plan: PT plan of care cert/re-cert      G-Codes - 65/99/35 7017    Functional Assessment Tool Used (Outpatient Only) clinical judgement: + positional testing   Functional Limitation Changing and maintaining body position   Changing and Maintaining Body Position Current Status (B9390) At least 20 percent but less than 40 percent impaired, limited or restricted   Changing and Maintaining Body Position Goal Status (Z0092) At least 1 percent but less than 20 percent impaired, limited or restricted       Problem List Patient Active Problem List   Diagnosis Date Noted  . Vertigo 08/10/2017  . Candidiasis, intertrigo 04/02/2017  . Diastolic CHF ([place]) 33/00/7622  .  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 Hypothyroidism 11/17/2016  . Episode of recurrent major depressive disorder ([place])   . History of CVA (cerebrovascular accident)   . Pure hypercholesterolemia 11/06/2016  . Chronic venous insufficiency 11/05/2016  . Peripheral neuropathy 11/05/2016  . Cerebrovascular disease 06/09/2016  . Orthostatic hypotension 06/09/2016  . Hypokalemia 02/20/2015  . History of colonic polyps 06/04/2014  . Depression 03/29/2014  . Chronic constipation 03/29/2014  . ABDOMINAL PAIN, RECURRENT 03/29/2008  . Anxiety state 10/07/2007  . Essential hypertension 07/25/2007  . GERD 07/25/2007     Erica Morris, PT, DPT 08/26/17 8:41 AM     [facility] [address] [place], [place], [address] Phone: [phone]   Fax:  [phone]  Name: Erica Morris MRN: [id number] Date of Birth: 14-Apr-1946

## 2017-09-02 ENCOUNTER — Ambulatory Visit: Payer: Medicare Other | Attending: Family Medicine

## 2017-09-02 VITALS — BP 171/89 | HR 64

## 2017-09-02 DIAGNOSIS — H8111 Benign paroxysmal vertigo, right ear: Secondary | ICD-10-CM | POA: Diagnosis present

## 2017-09-02 DIAGNOSIS — R42 Dizziness and giddiness: Secondary | ICD-10-CM | POA: Diagnosis present

## 2017-09-02 DIAGNOSIS — R2681 Unsteadiness on feet: Secondary | ICD-10-CM | POA: Diagnosis present

## 2017-09-02 NOTE — Therapy (Signed)
[facility] [address] [place], [place], [address] Phone: [phone]   Fax:  [phone]  Physical Therapy Treatment  Patient Details  Name: Erica Morris MRN: [id number] Date of Birth: 17-Oct-1946 Referring Provider: Madison Hickman, MD  Encounter Date: 09/02/2017      PT End of Session - 09/02/17 0845    Visit Number 2   Number of Visits 6   Date for PT Re-Evaluation 10/07/17   Authorization Type UHC Medicare $35 copay (pt concerned about being able to afford)   PT Start Time 0804   PT Stop Time 0842   PT Time Calculation (min) 38 min   Equipment Utilized During Treatment Gait belt   Activity Tolerance Other (comment)  elevated BP after positional testing and FGA   Behavior During Therapy [facility] for tasks assessed/performed      Past Medical History:  Diagnosis Date  . ABDOMINAL PAIN, RECURRENT 03/29/2008  . ANXIETY 10/07/2007  . Candidiasis of mouth 05/30/2010  . GERD 07/25/2007  . Hernia, hiatal   . HYPERTENSION 07/25/2007  . Stroke [facility])     Past Surgical History:  Procedure Laterality Date  . ABDOMINAL HYSTERECTOMY    . BREAST SURGERY     bx  . KNEE ARTHROSCOPY      Vitals:   09/02/17 0830 09/02/17 0840  BP: (!) 181/90 (!) 171/89  Pulse: 63 64        Subjective Assessment - 09/02/17 0807    Subjective Pt reports eating certain foods and getting upset makes dizziness worse. Pt has not taken Meclizine since last visit. Pt reported dizziness comes and goes.    Patient Stated Goals improve dizziness   Currently in Pain? No/denies            [facility] PT Assessment - 09/02/17 0816      Functional Gait  Assessment   Gait assessed  Yes   Gait Level Surface Walks 20 ft in less than 7 sec but greater than 5.5 sec, uses assistive device, slower speed, mild gait deviations, or deviates 6-10 in outside of the 12 in walkway width.  6.7 sec.   Change in Gait Speed Able to change speed, demonstrates mild  gait deviations, deviates 6-10 in outside of the 12 in walkway width, or no gait deviations, unable to achieve a major change in velocity, or uses a change in velocity, or uses an assistive device.   Gait with Horizontal Head Turns Performs head turns smoothly with slight change in gait velocity (eg, minor disruption to smooth gait path), deviates 6-10 in outside 12 in walkway width, or uses an assistive device.   Gait with Vertical Head Turns Performs task with slight change in gait velocity (eg, minor disruption to smooth gait path), deviates 6 - 10 in outside 12 in walkway width or uses assistive device   Gait and Pivot Turn Pivot turns safely within 3 sec and stops quickly with no loss of balance.  pt reported slight lightheadedness   Step Over Obstacle Is able to step over 2 stacked shoe boxes taped together (9 in total height) without changing gait speed. No evidence of imbalance.   Gait with Narrow Base of Support Ambulates less than 4 steps heel to toe or cannot perform without assistance.   Gait with Eyes Closed Walks 20 ft, slow speed, abnormal gait pattern, evidence for imbalance, deviates 10-15 in outside 12 in walkway width. Requires more than 9 sec to ambulate 20 ft.   Ambulating  Backwards Walks 20 ft, uses assistive device, slower speed, mild gait deviations, deviates 6-10 in outside 12 in walkway width.   Steps Alternating feet, must use rail.   Total Score 19   FGA comment: 19/30: indicates pt is at moderate risk for falls            Vestibular Assessment - 09/02/17 0808      Positional Testing   Dix-Hallpike Dix-Hallpike Right;Dix-Hallpike Left   Horizontal Canal Testing Horizontal Canal Right;Horizontal Canal Left     Dix-Hallpike Right   Dix-Hallpike Right Duration No nystagmus or spinning sensation but pt reported lightheadedness after approx. 30 sec.   Dix-Hallpike Right Symptoms No nystagmus     Dix-Hallpike Left   Dix-Hallpike Left Duration None   Dix-Hallpike  Left Symptoms No nystagmus     Horizontal Canal Right   Horizontal Canal Right Duration None   Horizontal Canal Right Symptoms Normal     Horizontal Canal Left   Horizontal Canal Left Duration No nystagmus but pt reported slight lightheadedness.   Horizontal Canal Left Symptoms Normal     Positional Sensitivities   Supine to Sitting Lightheadedness                         PT Education - 09/02/17 0844    Education provided Yes   Education Details PT discussed BPPV s/s appear to be resolved and that pt's elevated BP could be causing lightheadedness. PT educated pt that PT would send note to Dr. Andria Frames and instructed pt to call MD office once she arrives home to discuss elevated BP.   Person(s) Educated Patient   Methods Explanation   Comprehension Verbalized understanding             PT Long Term Goals - 09/02/17 0848      PT LONG TERM GOAL #1   Title independent with HEP   Status New     PT LONG TERM GOAL #2   Title demonstrate negative positional testing   Status New     PT LONG TERM GOAL #3   Title formal balance goals to follow PRN   Status Achieved     PT LONG TERM GOAL #4   Title Pt will improve FGA  score to >/=27/30 to decr. falls risk.    Status New               Plan - 09/02/17 5638    Clinical Impression Statement Pt demonstrated progress as pt's R pBPPV s/s resolved, all positional testing negative. Pt did experience lightheadedness during testing, especially during supine to sit and during FGA. Pt's FGA score indicates pt is at moderate risk for falls and would benefit from skilled PT to improve safety during functional mobility. Pt's BP was elevated after FGA testing and pt reported lightheadedness, PT instructed pt to notify Dr. Andria Frames and that PT would send him PT note.    Rehab Potential Fair   PT Frequency 1x / week   PT Duration 6 weeks   PT Treatment/Interventions ADLs/Self Care Home Management;Canalith  Repostioning;Neuromuscular re-education;Balance training;Therapeutic exercise;Therapeutic activities;Functional mobility training;Stair training;Gait training;Patient/family education;Vestibular   PT Next Visit Plan Assess BP, provide balance HEP, give gaze x 1 (instructed pt to stop taking meclizine if able)   Consulted and Agree with Plan of Care Patient      Patient will benefit from skilled therapeutic intervention in order to improve the following deficits and impairments:  Decreased balance, Dizziness, Decreased mobility  Visit Diagnosis: Dizziness and giddiness  BPPV (benign paroxysmal positional vertigo), right  Unsteadiness on feet     Problem List Patient Active Problem List   Diagnosis Date Noted  . Vertigo 08/10/2017  . Candidiasis, intertrigo 04/02/2017  . Diastolic CHF ([place]) 63/87/5643  . 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 Hypothyroidism 11/17/2016  . Episode of recurrent major depressive disorder ([place])   . History of CVA (cerebrovascular accident)   . Pure hypercholesterolemia 11/06/2016  . Chronic venous insufficiency 11/05/2016  . Peripheral neuropathy 11/05/2016  . Cerebrovascular disease 06/09/2016  . Orthostatic hypotension 06/09/2016  . Hypokalemia 02/20/2015  . History of colonic polyps 06/04/2014  . Depression 03/29/2014  . Chronic constipation 03/29/2014  . ABDOMINAL PAIN, RECURRENT 03/29/2008  . Anxiety state 10/07/2007  . Essential hypertension 07/25/2007  . GERD 07/25/2007    Miller,Jennifer L 09/02/2017, 8:48 AM  [facility] [address] [place], [place], [address] Phone: [phone]   Fax:  [phone]  Name: Erica Morris MRN: [id number] Date of Birth: Mar 06, 1946  Geoffry Paradise, PT,DPT 09/02/17 8:49 AM Phone: [phone] Fax: [phone]

## 2017-09-09 ENCOUNTER — Ambulatory Visit: Payer: Medicare Other

## 2017-09-09 DIAGNOSIS — R42 Dizziness and giddiness: Secondary | ICD-10-CM | POA: Diagnosis not present

## 2017-09-09 DIAGNOSIS — H8111 Benign paroxysmal vertigo, right ear: Secondary | ICD-10-CM | POA: Diagnosis not present

## 2017-09-09 DIAGNOSIS — R2681 Unsteadiness on feet: Secondary | ICD-10-CM | POA: Diagnosis not present

## 2017-09-09 NOTE — Therapy (Signed)
[facility] [address] [place], [place], [address] Phone: [phone]   Fax:  [phone]  Physical Therapy Treatment  Patient Details  Name: Erica Morris MRN: [id number] Date of Birth: 1946-05-30 Referring Provider: Madison Hickman, MD  Encounter Date: 09/09/2017      PT End of Session - 09/09/17 0832    Visit Number 3   Number of Visits 6   Date for PT Re-Evaluation 10/07/17   Authorization Type UHC Medicare $35 copay (pt concerned about being able to afford)   PT Start Time 0805   PT Stop Time 0829   PT Time Calculation (min) 24 min   Activity Tolerance Treatment limited secondary to medical complications (Comment)  incr.  BP   Behavior During Therapy Anxious      Past Medical History:  Diagnosis Date  . ABDOMINAL PAIN, RECURRENT 03/29/2008  . ANXIETY 10/07/2007  . Candidiasis of mouth 05/30/2010  . GERD 07/25/2007  . Hernia, hiatal   . HYPERTENSION 07/25/2007  . Stroke [facility])     Past Surgical History:  Procedure Laterality Date  . ABDOMINAL HYSTERECTOMY    . BREAST SURGERY     bx  . KNEE ARTHROSCOPY      There were no vitals filed for this visit.      Subjective Assessment - 09/09/17 0809    Subjective Pt reported she still has lightheadedness and she did not take medication this morning, as transportation arrived early. Pt reported dizziness started again (spinning) on Monday and she feels eyes jumping but denied falls or injury.   Patient Stated Goals improve dizziness   Currently in Pain? Yes   Pain Score 8    Pain Location Head   Pain Orientation --  forehead   Pain Descriptors / Indicators Headache   Pain Type Acute pain   Pain Onset Yesterday   Aggravating Factors  unsure   Pain Relieving Factors unsure                Vestibular Assessment - 09/09/17 0819      Positional Testing   Dix-Hallpike Dix-Hallpike Right;Dix-Hallpike Left   Horizontal Canal Testing Horizontal  Canal Right;Horizontal Canal Left     Orthostatics   BP supine (x 5 minutes) 162/92   HR supine (x 5 minutes) 62   BP sitting 175/90  pt reported lightheadedness upon sitting upright   HR sitting 65   BP standing (after 1 minute) 186/89  Pt denied dizziness but felt "wobbly"   HR standing (after 1 minute) 67   BP standing (after 3 minutes) 194/89   HR standing (after 3 minutes) 65   Orthostatics Comment Pt noted to experienced horizontal nystagmus when she transferred from sit to supine position, it ceased after approx. 1 minute. Pt reported feelings of being anxious.                         PT Education - 09/09/17 0816    Education provided Yes   Education Details PT encouraged pt to contact MD regarding incr. anxiety and panic attacks.    Person(s) Educated Patient   Methods Explanation   Comprehension Verbalized understanding;Need further instruction             PT Long Term Goals - 09/02/17 0848      PT LONG TERM GOAL #1   Title independent with HEP   Status New     PT LONG TERM GOAL #2  Title demonstrate negative positional testing   Status New     PT LONG TERM GOAL #3   Title formal balance goals to follow PRN   Status Achieved     PT LONG TERM GOAL #4   Title [name] improve FGA  score to >/=27/30 to decr. falls risk.    Status New               Plan - 09/09/17 1610    Clinical Impression Statement Session ceased early 2/2 pt c/o anxiety and elevated BP. PT educated pt on the importance of calling MD for appt. after PT session, and to go to ED if she experiences severe HA, N/T, weakness, chest pain, incr. in severe dizziness. Pt did experience horizontal nystagmus during supine position but PT did not treat, as pt's BP was elevated and pt very anxious today. [name] send note to MD, pt's anxiety-like symptoms and BP needs to be controlled prior to pt resuming PT for safety.   Rehab Potential Fair   PT Frequency 1x / week   PT  Duration 6 weeks   PT Treatment/Interventions ADLs/Self Care Home Management;Canalith Repostioning;Neuromuscular re-education;Balance training;Therapeutic exercise;Therapeutic activities;Functional mobility training;Stair training;Gait training;Patient/family education;Vestibular   PT Next Visit Plan Assess BP, positional testing as tolerated, provide balance HEP, give gaze x 1 (instructed pt to stop taking meclizine if able)   Consulted and Agree with Plan of Care Patient      [name] benefit from skilled therapeutic intervention in order to improve the following deficits and impairments:  Decreased balance, Dizziness, Decreased mobility  Visit Diagnosis: Dizziness and giddiness     Problem List Patient Active Problem List   Diagnosis Date Noted  . Vertigo 08/10/2017  . Candidiasis, intertrigo 04/02/2017  . Diastolic CHF ([place]) 96/02/5408  . 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 Hypothyroidism 11/17/2016  . Episode of recurrent major depressive disorder ([place])   . History of CVA (cerebrovascular accident)   . Pure hypercholesterolemia 11/06/2016  . Chronic venous insufficiency 11/05/2016  . Peripheral neuropathy 11/05/2016  . Cerebrovascular disease 06/09/2016  . Orthostatic hypotension 06/09/2016  . Hypokalemia 02/20/2015  . History of colonic polyps 06/04/2014  . Depression 03/29/2014  . Chronic constipation 03/29/2014  . ABDOMINAL PAIN, RECURRENT 03/29/2008  . Anxiety state 10/07/2007  . Essential hypertension 07/25/2007  . GERD 07/25/2007    Morris,Erica L 09/09/2017, 8:35 AM  [facility] [address] [place], [place], [address] Phone: [phone]   Fax:  [phone]  Name: Erica Morris MRN: [id number] Date of Birth: 12-Jan-1946  Erica Morris, PT,DPT 09/09/17 8:35 AM Phone: [phone] Fax: [phone]

## 2017-09-10 ENCOUNTER — Ambulatory Visit (INDEPENDENT_AMBULATORY_CARE_PROVIDER_SITE_OTHER): Payer: Medicare Other | Admitting: Family Medicine

## 2017-09-10 ENCOUNTER — Encounter: Payer: Self-pay | Admitting: Family Medicine

## 2017-09-10 ENCOUNTER — Encounter: Payer: Self-pay | Admitting: Psychology

## 2017-09-10 ENCOUNTER — Ambulatory Visit: Payer: Medicare Other | Admitting: Family Medicine

## 2017-09-10 VITALS — BP 142/80 | HR 65 | Temp 98.5°F | Ht 64.0 in | Wt 205.0 lb

## 2017-09-10 DIAGNOSIS — F411 Generalized anxiety disorder: Secondary | ICD-10-CM

## 2017-09-10 DIAGNOSIS — I10 Essential (primary) hypertension: Secondary | ICD-10-CM | POA: Diagnosis not present

## 2017-09-10 MED ORDER — METOPROLOL TARTRATE 25 MG PO TABS: 25.0000 mg | ORAL_TABLET | Freq: Two times a day (BID) | ORAL | 3 refills | Status: DC

## 2017-09-10 NOTE — Assessment & Plan Note (Addendum)
Chronic. Has history of panic attacks. Has failed multiple medications include fluoxetine, duloxetine, doxepin. --Will initiate Lopressor 25 mg twice daily --Instructed patient to discontinue doxepin given side effect of worsening hypertension --Patient agreeable to talking with [facility] during this visit

## 2017-09-10 NOTE — Patient Instructions (Signed)
Thank you for coming in to see [place] today. Please see below to review our plan for today's visit.  Take the Metoprolol as instructed twice daily. This will help with your blood pressure and your panic attacks.  Please call the clinic at [phone] if your symptoms worsen or you have any concerns. It was my pleasure to see you. -- Harriet [place], [place], PGY-2

## 2017-09-10 NOTE — Progress Notes (Signed)
Dr. Yisroel Ramming requested [facility] consult.   Presenting Issue: Patient is presenting with anxiety and panic attacks.   Report of symptoms: patient  Patient reports uncontrolled worries about her health, her family and finances as well as a history of panic attacks, the most recent one occurring yesterday, 09/09/17.She was told to go to the emergency room but did not. During these panic attacks she reports feeling jittery, sweaty and being fearful of dying. She is also frustrated about living with her daughter and reports feeling unappreciated and unwanted, which causes her distress. She would like to live on her own but is afraid to live by herself limiting her prospect of moving out.   Age of onset of first mood disturbance: 40 to 50 years  Impact on function: Her anxiety is impacting her blood pressure, she reports increased blood pressure when she has anxiety/ panic attack  Psychiatric History - Diagnoses: Anxiety, depression - Hospitalizations:  No  - Outpatient therapy: No  Current and history of substance use: Denies alcohol use and smoking.   GAD7: 20 - indicates severe anxiety.   Assessment/Plan Recommendations:  Patient had appropriate affect during visit and discussed her current difficult living situation, which is causing her distress. Her GAD-7 score (20) indicates severe anxiety. [facility] discussed previous strategies she has used to manage her anxiety. Patient has previously seen [facility], Sonia Baller, and reported that deep breathing has been helpful to her but she does not engage in this practice during times of heightened anxiety. [facility] discussed with her the importance of regular practice of deep breathing. Together with patient [facility] practiced deep breathing and instructed patient to practice several times a day. [facility] also engaged patient in discussion about positive happenings in her life but patient had a very difficult time identifying any positive things and instead focused on the negatives. Patient  will attempt to focus on things she is able to enjoy and that are under her control when she is by herself at home. She will return for [facility] follow-up in 2 weeks.  Warm hand-off complete.

## 2017-09-10 NOTE — Assessment & Plan Note (Addendum)
Chronic. Suboptimal control. Appears to have hypertensive urgencies with panic attacks by history. --We will initiate Lopressor 25 mg twice daily given history of panic attacks and hypertension
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
## 2017-09-16 ENCOUNTER — Ambulatory Visit (INDEPENDENT_AMBULATORY_CARE_PROVIDER_SITE_OTHER): Payer: Medicare Other | Admitting: Internal Medicine

## 2017-09-16 ENCOUNTER — Encounter: Payer: Self-pay | Admitting: Internal Medicine

## 2017-09-16 ENCOUNTER — Ambulatory Visit: Payer: Medicare Other

## 2017-09-16 DIAGNOSIS — R0981 Nasal congestion: Secondary | ICD-10-CM | POA: Diagnosis not present

## 2017-09-16 MED ORDER — BENZONATATE 100 MG PO CAPS: 100.0000 mg | ORAL_CAPSULE | Freq: Three times a day (TID) | ORAL | 0 refills | Status: DC | PRN

## 2017-09-16 MED ORDER — FLUTICASONE PROPIONATE 50 MCG/ACT NA SUSP: 2.0000 | Freq: Every day | NASAL | 0 refills | Status: DC

## 2017-09-16 NOTE — Patient Instructions (Addendum)
It was nice meeting you today Erica Morris!  For your cough, you can take one Tessalon perle up to every 8 hours as needed. You can also try eating a teaspoonful of honey 3-4 times a day, either alone or mixed into a warm beverage.   For headaches or body aches, you can take ibuprofen or Tylenol as directed on the bottle. This will not affect your blood pressure.   For your nasal congestion, you can use the Flonase nasal spray I have prescribed. You can also try regular nasal saline from the pharmacy if you like.   If you are not feeling any better in about a week, please let us know.   If you have any questions or concerns, please feel free to call the clinic.   Be well,  Dr. Avon Gully

## 2017-09-16 NOTE — Progress Notes (Signed)
   Subjective:   Patient: Erica Morris       Birthdate: 07-13-1946       MRN: [id number]      HPI  Erica Morris is a 71 y.o. female presenting for same day appt for nasal congestion.   Nasal congestion Ongoing for 1 wk. Patient is concerned that she has fluid on her lungs and will die. Is having difficulty breathing out of her nose. Says her mouth and lips are dry because she is only able to breathe out of her mouth. Has not taken any medications for this, other than an OTC pain reliever for a mild HA last night. Has been taking her Lasix as prescribed and has not missed any doses. Endorses chills, denies fevers or body aches. Says she has a "little cough" that is non-productive. Denies sick contacts, though does have small grandchildren. Normal appetite. No weight gain or LE edema.   Smoking status reviewed. Patient is never smoker.   Review of Systems See HPI.     Objective:  Physical Exam  Constitutional: She is oriented to person, place, and time and well-developed, well-nourished, and in no distress.  HENT:  Head: Normocephalic and atraumatic.  No oropharyngeal erythema or exudates. MMM.   Eyes: Pupils are equal, round, and reactive to light. Conjunctivae and EOM are normal. Right eye exhibits no discharge. Left eye exhibits no discharge.  Neck: Normal range of motion. Neck supple.  Cardiovascular: Normal rate, regular rhythm and normal heart sounds.   No murmur heard. Pulmonary/Chest: Effort normal and breath sounds normal. No respiratory distress. She has no wheezes. She has no rales.  Normal WOB on RA. Speaking in full sentences. O2 sat 99% on RA.  Musculoskeletal:  No LE edema  Lymphadenopathy:    She has no cervical adenopathy.  Neurological: She is alert and oriented to person, place, and time.  Skin: Skin is warm and dry.  Psychiatric: Affect and judgment normal.      Assessment & Plan:  Nasal congestion Likely viral etiology. Lungs CTAB, O2 sat 99% on RA and  patient with normal WOB on RA, so doubt PNA or pulmonary edema. Patient also without weight gain, without LE edema, and has not missed any doses of Lasix, so doubt fluid overload. Well-appearing and afebrile on exam. Well-hydrated with MMM. Symptomatic management.  - Ibuprofen/Tylenol PRN headache, body aches - Tessalon perles TID PRN and honey for cough - Flonase and nasal saline for nasal congestion   Adin Hector, MD, MPH PGY-3 [facility] Pager [phone]

## 2017-09-16 NOTE — Assessment & Plan Note (Signed)
Likely viral etiology. Lungs CTAB, O2 sat 99% on RA and patient with normal WOB on RA, so doubt PNA or pulmonary edema. Patient also without weight gain, without LE edema, and has not missed any doses of Lasix, so doubt fluid overload. Well-appearing and afebrile on exam. Well-hydrated with MMM. Symptomatic management.  - Ibuprofen/Tylenol PRN headache, body aches - Tessalon perles TID PRN and honey for cough - Flonase and nasal saline for nasal congestion

## 2017-09-23 ENCOUNTER — Encounter: Payer: Self-pay | Admitting: Physical Therapy

## 2017-09-23 ENCOUNTER — Ambulatory Visit: Payer: Medicare Other | Admitting: Physical Therapy

## 2017-09-23 VITALS — BP 140/82 | HR 56

## 2017-09-23 DIAGNOSIS — H8111 Benign paroxysmal vertigo, right ear: Secondary | ICD-10-CM | POA: Diagnosis not present

## 2017-09-23 DIAGNOSIS — R2681 Unsteadiness on feet: Secondary | ICD-10-CM

## 2017-09-23 DIAGNOSIS — R42 Dizziness and giddiness: Secondary | ICD-10-CM

## 2017-09-23 NOTE — Therapy (Signed)
Erica Morris [address] [place], [place], [address] Phone: [phone]   Fax:  [phone]  Physical Therapy Treatment  Patient Details  Name: Erica Morris MRN: [id number] Date of Birth: 02/03/1946 Referring Provider: Madison Hickman, MD  Encounter Date: 09/23/2017      PT End of Session - 09/23/17 0859    Visit Number 4   Number of Visits 6   Date for PT Re-Evaluation 10/07/17   Authorization Type UHC Medicare $35 copay (pt concerned about being able to afford)   PT Start Time 0805   PT Stop Time 0849   PT Time Calculation (min) 44 min   Activity Tolerance Patient tolerated treatment well   Behavior During Therapy [facility] for tasks assessed/performed      Past Medical History:  Diagnosis Date  . ABDOMINAL PAIN, RECURRENT 03/29/2008  . ANXIETY 10/07/2007  . Candidiasis of mouth 05/30/2010  . GERD 07/25/2007  . Hernia, hiatal   . HYPERTENSION 07/25/2007  . Stroke [facility])     Past Surgical History:  Procedure Laterality Date  . ABDOMINAL HYSTERECTOMY    . BREAST SURGERY     bx  . KNEE ARTHROSCOPY      Vitals:   09/23/17 0810  BP: 140/82  Pulse: (!) 56        Subjective Assessment - 09/23/17 0814    Subjective Pt reports she had an appointment with PCP last week who put her on BP medication and referred her to [facility] due to anxiety and panic attacks.  Pt's BP improved but she continues to report dizziness especially when she becomes upset.  Pt spent time describing home situation and stressors in her life.     Patient Stated Goals improve dizziness   Currently in Pain? No/denies   Pain Onset Yesterday                Vestibular Assessment - 09/23/17 0827      Vestibular Assessment   General Observation Dizziness and panic attacks began after her husband and other passed away in February 28, 2012.  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     Positional Testing   Dix-Hallpike Dix-Hallpike Right;Dix-Hallpike Left     Dix-Hallpike Right   Dix-Hallpike Right Duration 0   Dix-Hallpike Right Symptoms No nystagmus     Dix-Hallpike Left   Dix-Hallpike Left Duration 0   Dix-Hallpike Left Symptoms No nystagmus     Horizontal Canal Right   Horizontal Canal Right Duration 0   Horizontal Canal Right Symptoms Normal     Horizontal Canal Left   Horizontal Canal Left Duration 0   Horizontal Canal Left Symptoms Normal                 [facility] Adult PT Treatment/Exercise - 09/23/17 0851      Self-Care   Self-Care Other Self-Care Comments   Other Self-Care Comments  Pt spent increased time describing her home life and the stress she is under due to family dynamics.  She reports her dizziness began in 02-28-2012 after her husband and mother died and since then she has not had anyone to talk to for support.  Reports poor relationships with children and not feeling supported by daughter that she lives with.  She would like to live on her own in low income housing but she  doesn't believe a home will become available and she is afraid to be alone (most panic attacks are at night when she is alone).  She would also like to go out and visit people or be involved in activities but her daughter does not assist her with transportation.  Pt feels very lonely.  Discussed role of anxiety/stress and relation to dizziness and importance of continuing to meet with the mental health professional at [facility] for support, coping and management of stress/anxiety since she is not able to leave her current situation.  Provided emotional support and encouragement to patient.  Pt was better able to tolerate positional testing today with decreased anxiety.         Vestibular Treatment/Exercise - 09/23/17 0839      Vestibular Treatment/Exercise   Gaze Exercises X1 Viewing Horizontal;X1  Viewing Vertical     X1 Viewing Horizontal   Foot Position seated   Reps 1   Comments 30 seconds     X1 Viewing Vertical   Foot Position seated   Reps 1   Comments 30 seconds               PT Education - 09/23/17 0858    Education provided Yes   Education Details Pt encouraged to continue to see mental health professional for stress management/anxiety to assist with managing the dizziness   Person(s) Educated Patient   Methods Explanation   Comprehension Verbalized understanding             PT Long Term Goals - 09/23/17 0913      PT LONG TERM GOAL #1   Title independent with HEP   Status On-going   Target Date 10/07/17     PT LONG TERM GOAL #2   Title demonstrate negative positional testing   Baseline negative on 10/25   Status Achieved     PT LONG TERM GOAL #3   Title Pt will demonstrate improved use of VOR as indicated by ability to perform x 1 viewing in standing with wide BOS x 30 seconds with minimal reports of dizziness   Status Achieved   Target Date 10/07/17     PT LONG TERM GOAL #4   Title Pt will improve FGA  score to >/=27/30 to decr. falls risk.    Status On-going   Target Date 10/07/17     PT LONG TERM GOAL #5   Title Pt will demonstrate effective diaphragmatic breathing to assist with management of BP during panic attacks.   Status New   Target Date 10/07/17               Plan - 09/23/17 0900    Clinical Impression Statement Treatment session today focused on education regarding pt's current home situation, stress level/anxiety/panic attacks, BP and her episodes of dizziness.  Also discussed with pt importance of meeting with mental health professional for emotional support/coping.  Also continued assessment of peripheral canals with no evidence of nystagmus or vertigo.  Performed assessment of vestibular function with HIT with refixation saccade noted on L HIT.  Provided pt with VOR x 1 viewing in sitting.  Pt set up with 2 more  sessions to continue to address hypofunction.  Will continue to progress towards LTG.   Rehab Potential Fair   PT Frequency 1x / week   PT Duration 6 weeks   PT Treatment/Interventions ADLs/Self Care Home Management;Canalith Repostioning;Neuromuscular re-education;Balance training;Therapeutic exercise;Therapeutic activities;Functional mobility training;Stair training;Gait training;Patient/family education;Vestibular   PT Next Visit Plan ASSESS BP  EACH VISIT DUE TO HTN WITH ANXIETY.  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 Continue to work on x 1 viewing and progress.  MSQ and habituation to provocative movements; deep breathing for stress management   Recommended Other Services mental health professional for coping/anxiety management.     Consulted and Agree with Plan of Care Patient      Patient will benefit from skilled therapeutic intervention in order to improve the following deficits and impairments:  Decreased balance, Dizziness, Decreased mobility  Visit Diagnosis: Dizziness and giddiness  Unsteadiness on feet     Problem List Patient Active Problem List   Diagnosis Date Noted  . Nasal congestion 09/16/2017  . Vertigo 08/10/2017  . Candidiasis, intertrigo 04/02/2017  . Diastolic CHF ([place]) 32/95/1884  . 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 Hypothyroidism 11/17/2016  . Episode of recurrent major depressive disorder ([place])   . History of CVA (cerebrovascular accident)   . Pure hypercholesterolemia 11/06/2016  . Chronic venous insufficiency 11/05/2016  . Peripheral neuropathy 11/05/2016  . Cerebrovascular disease 06/09/2016  . Orthostatic hypotension 06/09/2016  . Hypokalemia 02/20/2015  . History of colonic polyps 06/04/2014  . Depression 03/29/2014  . Chronic constipation 03/29/2014  . ABDOMINAL PAIN, RECURRENT 03/29/2008  . Anxiety state 10/07/2007  . Essential hypertension 07/25/2007  . GERD 07/25/2007    Rico Junker, PT, DPT 09/23/17    9:16 AM    [facility] [address] [place], [place], [address] Phone: [phone]   Fax:  [phone]  Name: LOVELLA HARDIE MRN: [id number] Date of Birth: 04-06-1946

## 2017-09-23 NOTE — Patient Instructions (Signed)
Gaze Stabilization: Sitting    Keeping eyes on target on wall 3 feet away, and move head side to side for _30__ seconds. Repeat while moving head up and down for __30_ seconds. Do __2-3__ sessions per day.  Copyright  VHI. All rights reserved.   Gaze Stabilization: Tip Card  1.Target must remain in focus, not blurry, and appear stationary while head is in motion. 2.Perform exercises with small head movements (45 to either side of midline). 3.Increase speed of head motion so long as target is in focus. 4.If you wear eyeglasses, be sure you can see target through lens (therapist will give specific instructions for bifocal / progressive lenses). 5.These exercises may provoke dizziness or nausea. Work through these symptoms. If too dizzy, slow head movement slightly. Rest between each exercise. 6.Exercises demand concentration; avoid distractions.  Copyright  VHI. All rights reserved.     

## 2017-09-24 ENCOUNTER — Ambulatory Visit: Payer: Medicare Other | Admitting: Family Medicine

## 2017-09-24 ENCOUNTER — Ambulatory Visit: Payer: Medicare Other

## 2017-09-27 ENCOUNTER — Telehealth: Payer: Self-pay | Admitting: Family Medicine

## 2017-09-27 NOTE — Telephone Encounter (Signed)
Pt says her nose is dry and cant get anything in or out. Used nose drop and they dont help. Mouth is dry and has to carry around bottle of water all the time. She would like to have something called in. [facility] [address]

## 2017-09-27 NOTE — Telephone Encounter (Signed)
Please advise normal saline nasal spray.

## 2017-09-28 NOTE — Telephone Encounter (Signed)
LVM for pt to call back to inform them of below. Katharina Caper, Erica Morris, [place]

## 2017-10-03 ENCOUNTER — Encounter (HOSPITAL_COMMUNITY): Payer: Self-pay | Admitting: Emergency Medicine

## 2017-10-03 ENCOUNTER — Emergency Department (HOSPITAL_COMMUNITY)
Admission: EM | Admit: 2017-10-03 | Discharge: 2017-10-03 | Disposition: A | Discharge status: Home / Self Care | Payer: Medicare Other | Attending: Emergency Medicine | Admitting: Emergency Medicine

## 2017-10-03 ENCOUNTER — Emergency Department (HOSPITAL_COMMUNITY): Payer: Medicare Other

## 2017-10-03 ENCOUNTER — Other Ambulatory Visit: Payer: Self-pay

## 2017-10-03 DIAGNOSIS — Z79899 Other long term (current) drug therapy: Secondary | ICD-10-CM | POA: Insufficient documentation

## 2017-10-03 DIAGNOSIS — R0981 Nasal congestion: Secondary | ICD-10-CM | POA: Diagnosis not present

## 2017-10-03 DIAGNOSIS — E039 Hypothyroidism, unspecified: Secondary | ICD-10-CM | POA: Insufficient documentation

## 2017-10-03 DIAGNOSIS — F419 Anxiety disorder, unspecified: Secondary | ICD-10-CM | POA: Diagnosis not present

## 2017-10-03 DIAGNOSIS — I110 Hypertensive heart disease with heart failure: Secondary | ICD-10-CM | POA: Insufficient documentation

## 2017-10-03 DIAGNOSIS — R0602 Shortness of breath: Secondary | ICD-10-CM | POA: Diagnosis present

## 2017-10-03 DIAGNOSIS — F329 Major depressive disorder, single episode, unspecified: Secondary | ICD-10-CM | POA: Diagnosis not present

## 2017-10-03 DIAGNOSIS — R05 Cough: Secondary | ICD-10-CM | POA: Diagnosis not present

## 2017-10-03 DIAGNOSIS — Z8673 Personal history of transient ischemic attack (TIA), and cerebral infarction without residual deficits: Secondary | ICD-10-CM | POA: Diagnosis not present

## 2017-10-03 DIAGNOSIS — I5030 Unspecified diastolic (congestive) heart failure: Secondary | ICD-10-CM | POA: Insufficient documentation

## 2017-10-03 DIAGNOSIS — Z7982 Long term (current) use of aspirin: Secondary | ICD-10-CM | POA: Diagnosis not present

## 2017-10-03 LAB — BASIC METABOLIC PANEL
Anion gap: 9 (ref 5–15)
BUN: 12 mg/dL (ref 6–20)
CO2: 28 mmol/L (ref 22–32)
Calcium: 9 mg/dL (ref 8.9–10.3)
Chloride: 102 mmol/L (ref 101–111)
Creatinine, Ser: 0.82 mg/dL (ref 0.44–1.00)
GFR calc Af Amer: >60 mL/min (ref >60)
GFR calc non Af Amer: >60 mL/min (ref >60)
Glucose, Bld: 133 mg/dL — ABNORMAL HIGH (ref 65–99)
Potassium: 3.1 mmol/L — ABNORMAL LOW (ref 3.5–5.1)
Sodium: 139 mmol/L (ref 135–145)

## 2017-10-03 LAB — CBC
HCT: 36.4 % (ref 36.0–46.0)
Hemoglobin: 11.6 g/dL — ABNORMAL LOW (ref 12.0–15.0)
MCH: 26.1 pg (ref 26.0–34.0)
MCHC: 31.9 g/dL (ref 30.0–36.0)
MCV: 82 fL (ref 78.0–100.0)
Platelets: 248 10*3/uL (ref 150–400)
RBC: 4.44 MIL/uL (ref 3.87–5.11)
RDW: 13.4 % (ref 11.5–15.5)
WBC: 9.4 10*3/uL (ref 4.0–10.5)

## 2017-10-03 LAB — I-STAT TROPONIN, ED
Troponin i, poc: 0 ng/mL (ref 0.00–0.08)
Troponin i, poc: 0 ng/mL (ref 0.00–0.08)

## 2017-10-03 LAB — BRAIN NATRIURETIC PEPTIDE: B Natriuretic Peptide: 86.5 pg/mL (ref 0.0–100.0)

## 2017-10-03 MED ORDER — FLUTICASONE PROPIONATE 50 MCG/ACT NA SUSP: 2.0000 | Freq: Every day | NASAL | 1 refills | Status: DC

## 2017-10-03 MED ORDER — METOPROLOL TARTRATE 25 MG PO TABS
25.0000 mg | ORAL_TABLET | Freq: Once | ORAL | Status: AC
  Administered 2017-10-03: 25 mg via ORAL
  Filled 2017-10-03: qty 1

## 2017-10-03 NOTE — ED Triage Notes (Addendum)
C/o sob that started earlier this morning with non-productive cough. Pt speaking in complete sentences.  Also reports dry mouth and vaginal itching.

## 2017-10-03 NOTE — Discharge Instructions (Signed)
Continue your steroid nasal spray.  Follow-up with your primary care doctor if not feeling better in the next week.

## 2017-10-03 NOTE — ED Notes (Addendum)
Pt reports she cannot catch her breath. Pt states it started this morning. Pt also reports that she started a fluid pill and it has made her mouth dry. Pt reports that she also has some vaginal itching.

## 2017-10-03 NOTE — ED Provider Notes (Signed)
[facility] Provider Note   CSN: [id number] Arrival date & time: 10/03/17  1729     History   Chief Complaint Chief Complaint  Patient presents with  . Shortness of Breath    HPI Erica Morris is a 71 y.o. female.  HPI Pt states she is feeling short of breath.  Her nose and mouth feel dry.  Her lips feel dry and she has tried chapstick.  She does not feel like she can get air in.  Her sx started today.  She has been coughing, bringing nothing up.  No fevers.  No CP.  Patient states she was home this evening and her nose was feeling really dry, she felt like she was not able to breathe and it scared her so she came into the emergency room.   Past Medical History:  Diagnosis Date  . ABDOMINAL PAIN, RECURRENT 03/29/2008  . ANXIETY 10/07/2007  . Candidiasis of mouth 05/30/2010  . GERD 07/25/2007  . Hernia, hiatal   . HYPERTENSION 07/25/2007  . Stroke [facility])     Patient Active Problem List   Diagnosis Date Noted  . Nasal congestion 09/16/2017  . Vertigo 08/10/2017  . Candidiasis, intertrigo 04/02/2017  . Diastolic CHF ([place]) 64/33/2951  . 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 Hypothyroidism 11/17/2016  . Episode of recurrent major depressive disorder ([place])   . History of CVA (cerebrovascular accident)   . Pure hypercholesterolemia 11/06/2016  . Chronic venous insufficiency 11/05/2016  . Peripheral neuropathy 11/05/2016  . Cerebrovascular disease 06/09/2016  . Orthostatic hypotension 06/09/2016  . Hypokalemia 02/20/2015  . History of colonic polyps 06/04/2014  . Depression 03/29/2014  . Chronic constipation 03/29/2014  . ABDOMINAL PAIN, RECURRENT 03/29/2008  . Anxiety state 10/07/2007  . Essential hypertension 07/25/2007  . GERD 07/25/2007    Past Surgical History:  Procedure Laterality Date  . ABDOMINAL HYSTERECTOMY    . BREAST SURGERY     bx  . KNEE ARTHROSCOPY      OB History    No  data available       Home Medications    Prior to Admission medications   Medication Sig Start Date End Date Taking? Authorizing Provider  albuterol (PROVENTIL HFA;VENTOLIN HFA) 108 (90 Base) MCG/ACT inhaler Inhale 2 puffs into the lungs every 4 (four) hours as needed for wheezing or shortness of breath.    [provider]  aspirin EC 81 MG tablet Take 81 mg by mouth every morning.     [provider]  atorvastatin (LIPITOR) 40 MG tablet Take 1 tablet (40 mg total) by mouth daily. 01/11/17   Dickie La, MD  benzonatate (TESSALON PERLES) 100 MG capsule Take 1 capsule (100 mg total) by mouth 3 (three) times daily as needed for cough. 09/16/17   Verner Mould, MD  doxepin (SINEQUAN) 25 MG capsule Take 1 capsule (25 mg total) by mouth at bedtime. 07/29/17   Zenia Resides, MD  fluticasone (FLONASE) 50 MCG/ACT nasal spray Place 2 sprays daily into both nostrils. 10/03/17   Dorie Rank, MD  furosemide (LASIX) 20 MG tablet Take 1 tablet (20 mg total) by mouth daily. 03/10/17   Zenia Resides, MD  ketoconazole (NIZORAL) 2 % cream Apply 1 application topically daily. Patient not taking: Reported on 08/26/2017 04/02/17   Bufford Lope, DO  levothyroxine (SYNTHROID, LEVOTHROID) 25 MCG tablet TAKE 1 TABLET BY MOUTH  DAILY BEFORE BREAKFAST 05/12/17   Hensel,  Jamal Collin, MD  meclizine (ANTIVERT) 12.5 MG tablet Take 1 tablet (12.5 mg total) by mouth 3 (three) times daily as needed for dizziness. Patient not taking: Reported on 09/10/2017 08/05/17   Zenia Resides, MD  metoprolol tartrate (LOPRESSOR) 25 MG tablet Take 1 tablet (25 mg total) by mouth 2 (two) times daily. 09/10/17   [place] Bing, DO  pantoprazole (PROTONIX) 40 MG tablet TAKE 1 TABLET(40 MG) BY MOUTH DAILY 07/19/17   Zenia Resides, MD  triamcinolone ointment (KENALOG) 0.1 % Apply 1 application topically 2 (two) times daily. Patient not taking: Reported on 08/26/2017 01/14/17   Zenia Resides, MD    Family  History Family History  Problem Relation Age of Onset  . Dementia Mother   . COPD Father   . Diabetes Sister   . Hypertension Sister   . Cancer Brother        Lung CA  . Clotting disorder Brother   . Kidney disease Neg Hx   . Stroke Neg Hx     Social History Social History   Tobacco Use  . Smoking status: Never Smoker  . Smokeless tobacco: Never Used  Substance Use Topics  . Alcohol use: Yes    Comment: occasional  . Drug use: No     Allergies   Lisinopril; Codeine phosphate; and Morphine and related   Review of Systems Review of Systems  Genitourinary: Negative for vaginal bleeding and vaginal discharge.       Vaginal itching  All other systems reviewed and are negative.    Physical Exam Updated Vital Signs BP (!) 163/75   Pulse (!) 55   Temp 98.5 F (36.9 C) (Oral)   Resp 17   Ht 1.626 m (5\' 4" )   Wt 91.6 kg (202 lb)   SpO2 100%   BMI 34.67 kg/m   Physical Exam  Constitutional: She appears well-developed and well-nourished. No distress.  HENT:  Head: Normocephalic and atraumatic.  Right Ear: External ear normal.  Left Ear: External ear normal.  Eyes: Conjunctivae are normal. Right eye exhibits no discharge. Left eye exhibits no discharge. No scleral icterus.  Neck: Neck supple. No tracheal deviation present.  Cardiovascular: Normal rate, regular rhythm and intact distal pulses.  Pulmonary/Chest: Effort normal and breath sounds normal. No stridor. No respiratory distress. She has no decreased breath sounds. She has no wheezes. She has no rales. She exhibits no mass, no tenderness, no bony tenderness, no edema and no swelling.  Patient is able to speak in full sentences without any difficulty  Abdominal: Soft. Bowel sounds are normal. She exhibits no distension. There is no tenderness. There is no rebound and no guarding.  Genitourinary: There is no tenderness or injury on the right labia. There is no tenderness or injury on the left labia.    Genitourinary Comments: Mild erythema noted in the perineum, no exudate  Musculoskeletal: She exhibits no edema or tenderness.       Right lower leg: Normal. She exhibits no tenderness and no edema.       Left lower leg: Normal. She exhibits no tenderness and no edema.  Neurological: She is alert. She has normal strength. No cranial nerve deficit (no facial droop, extraocular movements intact, no slurred speech) or sensory deficit. She exhibits normal muscle tone. She displays no seizure activity. Coordination normal.  Skin: Skin is warm and dry. No rash noted.  Psychiatric: She has a normal mood and affect.  Nursing note and vitals reviewed.  ED Treatments / Results  Labs (all labs ordered are listed, but only abnormal results are displayed) Labs Reviewed  BASIC METABOLIC PANEL - Abnormal; Notable for the following components:      Result Value   Potassium 3.1 (*)    Glucose, Bld 133 (*)    All other components within normal limits  CBC - Abnormal; Notable for the following components:   Hemoglobin 11.6 (*)    All other components within normal limits  BRAIN NATRIURETIC PEPTIDE  I-STAT TROPONIN, ED  I-STAT TROPONIN, ED    EKG  EKG Interpretation  Date/Time:  Sunday October 03 2017 17:33:19 EST Ventricular Rate:  69 PR Interval:  136 QRS Duration: 78 QT Interval:  412 QTC Calculation: 441 R Axis:   48 Text Interpretation:  Normal sinus rhythm Nonspecific ST abnormality Abnormal ECG No significant change since last tracing Confirmed by Dorie Rank [phone]) on 10/03/2017 8:38:58 PM       Radiology Dg Chest 2 View  Result Date: 10/03/2017 CLINICAL DATA:  Patient with shortness of breath. Nonproductive cough. EXAM: CHEST  2 VIEW COMPARISON:  Chest radiograph 12/24/2016 FINDINGS: Cardiac contours upper limits of normal. Aortic atherosclerosis. No consolidative pulmonary opacities. No pleural effusion or pneumothorax. Thoracic spine degenerative changes. IMPRESSION: No acute  cardiopulmonary process. Electronically Signed   By: Lovey Newcomer M.D.   On: 10/03/2017 18:51    Procedures Procedures (including critical care time)  Medications Ordered in ED Medications  metoprolol tartrate (LOPRESSOR) tablet 25 mg (25 mg Oral Given 10/03/17 2113)     Initial Impression / Assessment and Plan / ED Course  I have reviewed the triage vital signs and the nursing notes.  Pertinent labs & imaging results that were available during my care of the patient were reviewed by me and considered in my medical decision making (see chart for details).   Patient presented to the emergency room with complaints of shortness of breath.  She was initially noted to be hypertensive but her blood pressure improved as she was here in the emergency room.  She was actually breathing very easily.  She is able to speak in full sentences.  I think there may have been a component of anxiety.  No evidence of pneumonia congestive heart failure or acute coronary syndrome.  I doubt pulmonary embolism.  Patient previously was given a prescription for Flonase nasal spray.  I will give her a refill of that medication.  Discussed follow-up with her primary care doctor.  Final Clinical Impressions(s) / ED Diagnoses   Final diagnoses:  Nasal congestion  SOB (shortness of breath)      Dorie Rank, MD 10/03/17 2255

## 2017-10-06 ENCOUNTER — Encounter: Payer: Self-pay | Admitting: Physical Therapy

## 2017-10-06 ENCOUNTER — Ambulatory Visit: Payer: Medicare Other | Attending: Family Medicine | Admitting: Physical Therapy

## 2017-10-06 VITALS — BP 174/92 | HR 59

## 2017-10-06 DIAGNOSIS — R42 Dizziness and giddiness: Secondary | ICD-10-CM | POA: Diagnosis not present

## 2017-10-06 DIAGNOSIS — R2681 Unsteadiness on feet: Secondary | ICD-10-CM | POA: Insufficient documentation

## 2017-10-06 NOTE — Patient Instructions (Signed)
Gaze Stabilization: Sitting    POSITION LETTER SLIGHTLY LOWER THAN EYE LEVEL.  TILT HEAD DOWN SLIGHTLY.  Keeping eyes on target on wall 3 feet away, and move head side to side for _30-60___ seconds. Repeat while moving head up and down for __30-60__ seconds. Do __2-3__ sessions per day.  Copyright  VHI. All rights reserved.   Gaze Stabilization: Tip Card  1.Target must remain in focus, not blurry, and appear stationary while head is in motion. 2.Perform exercises with small head movements (45 to either side of midline). 3.Increase speed of head motion so long as target is in focus. 4.If you wear eyeglasses, be sure you can see target through lens (therapist will give specific instructions for bifocal / progressive lenses). 5.These exercises may provoke dizziness or nausea. Work through these symptoms. If too dizzy, slow head movement slightly. Rest between each exercise. 6.Exercises demand concentration; avoid distractions.  Copyright  VHI. All rights reserved.   Feet Heel-Toe "Tandem"    ONE HAND TOUCHING COUNTER TOP, walk a straight line bringing one foot directly in front of the other. Repeat for 4 LAPS per session. Do __2__ sessions per day.   Feet Together, Head Motion - Eyes Open    With eyes open, feet together, move head slowly: up and down 10 TIMES, SIDE TO SIDE 10 TIMES Repeat 2 times per session. Do 2 sessions per day.      With eyes closed and feet together, KEEP HEAD STILL AND STAND UPRIGHT FOR 12 SECONDS Repeat 2 times per session. Do 2 sessions per day.  Stabilization: Diaphragmatic Breathing    SIT RECLINED IN RECLINER. Place one hand on stomach, other on chest. Breathe deeply through nose, lifting belly hand without any motion of hand on chest. Repeat __10__ times per set. Do __2__ sets per session.

## 2017-10-06 NOTE — Therapy (Signed)
Erica Morris [address] [place] Erica Morris, [place], [address] Phone: [phone]   Fax:  [phone]  Physical Therapy Treatment  Patient Details  Name: Erica Morris MRN: [id number] Date of Birth: [birth year] Referring Provider: Madison Hickman, MD   Encounter Date: 10/06/2017  PT End of Session - 10/06/17 0913    Visit Number  5    Number of Visits  6    Date for PT Re-Evaluation  23/30/07 recert today for one more visit   recert today for one more visit   Authorization Type  [facility] Medicare $35 copay (pt concerned about being able to afford)    PT Start Time  0813    PT Stop Time  0900    PT Time Calculation (min)  47 min    Activity Tolerance  Patient tolerated treatment well    Behavior During Therapy  [facility] for tasks assessed/performed       Past Medical History:  Diagnosis Date  . ABDOMINAL PAIN, RECURRENT 03/29/2008  . ANXIETY 10/07/2007  . Candidiasis of mouth 05/30/2010  . GERD 07/25/2007  . Hernia, hiatal   . HYPERTENSION 07/25/2007  . Stroke [facility])     Past Surgical History:  Procedure Laterality Date  . ABDOMINAL HYSTERECTOMY    . BREAST SURGERY     bx  . KNEE ARTHROSCOPY      Vitals:   10/06/17 0820  BP: (!) 174/92  Pulse: (!) 59    Subjective Assessment - 10/06/17 0820    Subjective  BP continues to be slightly elevated; still having panic attacks and reports medication is not helping.  Dizziness is improving; has been performing deep breathing and x 1 viewing.    Patient Stated Goals  improve dizziness    Currently in Pain?  No/denies    Pain Onset  Efrain Sella PT Assessment - 10/06/17 6226      Assessment   Medical Diagnosis  vertigo    Referring Provider  Madison Hickman, MD    Onset Date/Surgical Date  -- 2 months   2 months   Prior Therapy  none      Precautions   Precautions  Other (comment)    Precaution Comments  Assess vitals each visit due to HTN      Balance Screen    Has the patient fallen in the past 6 months  No      Prior Function   Level of Independence  Independent      Functional Gait  Assessment   Gait assessed   Yes    Gait Level Surface  Walks 20 ft in less than 7 sec but greater than 5.5 sec, uses assistive device, slower speed, mild gait deviations, or deviates 6-10 in outside of the 12 in walkway width.    Change in Gait Speed  Able to change speed, demonstrates mild gait deviations, deviates 6-10 in outside of the 12 in walkway width, or no gait deviations, unable to achieve a major change in velocity, or uses a change in velocity, or uses an assistive device.    Gait with Horizontal Head Turns  Performs head turns smoothly with slight change in gait velocity (eg, minor disruption to smooth gait path), deviates 6-10 in outside 12 in walkway width, or uses an assistive device.    Gait with Vertical Head Turns  Performs head turns with no change in gait. Deviates no more than 6 in outside 12  in walkway width.    Gait and Pivot Turn  Pivot turns safely within 3 sec and stops quickly with no loss of balance.    Step Over Obstacle  Is able to step over 2 stacked shoe boxes taped together (9 in total height) without changing gait speed. No evidence of imbalance.    Gait with Narrow Base of Support  Ambulates less than 4 steps heel to toe or cannot perform without assistance.    Gait with Eyes Closed  Walks 20 ft, slow speed, abnormal gait pattern, evidence for imbalance, deviates 10-15 in outside 12 in walkway width. Requires more than 9 sec to ambulate 20 ft.    Ambulating Backwards  Walks 20 ft, uses assistive device, slower speed, mild gait deviations, deviates 6-10 in outside 12 in walkway width.    Steps  Two feet to a stair, must use rail.    Total Score  19    FGA comment:  19/30 unchanged: indicates pt is at moderate risk for falls         Vestibular Assessment - 10/06/17 0824      Positional Testing   Dix-Hallpike  Dix-Hallpike  Right;Dix-Hallpike Left      Dix-Hallpike Right   Dix-Hallpike Right Duration  0    Dix-Hallpike Right Symptoms  No nystagmus      Dix-Hallpike Left   Dix-Hallpike Left Duration  0    Dix-Hallpike Left Symptoms  No nystagmus              [facility] Adult PT Treatment/Exercise - 10/06/17 0910      Self-Care   Self-Care  Other Self-Care Comments    Other Self-Care Comments   Diaphragmatic breathing training reclined with focus on decreasing use of accessory muscles and increased use of diaphragm and core for pursed lip breathing and increased activation of parasympathetic NS for anxiety/BP/dizziness management      Vestibular Treatment/Exercise - 10/06/17 0911      Vestibular Treatment/Exercise   Gaze Exercises  X1 Viewing Horizontal;X1 Viewing Vertical      X1 Viewing Horizontal   Foot Position  seated    Reps  2    Comments  30 seconds; adjusted by tilting head down 15 deg for improved positioning for vestibular stimulation      X1 Viewing Vertical   Foot Position  seated    Reps  2    Comments  30 seconds         Balance Exercises - 10/06/17 0909      Balance Exercises: Standing   Standing Eyes Opened  Narrow base of support (BOS);Head turns;Solid surface;Other reps (comment) 10 reps head nods/turns   10 reps head nods/turns   Standing Eyes Closed  Narrow base of support (BOS);Solid surface;10 secs    Tandem Gait  Forward;Upper extremity support;4 reps        PT Education - 10/06/17 0912    Education provided  Yes    Education Details  adjusted and progressed HEP; discussed progress towards LTG and plan to recertify for one more visit    Person(s) Educated  Patient    Methods  Explanation;Handout;Demonstration    Comprehension  Verbalized understanding;Returned demonstration          PT Long Term Goals - 10/06/17 6226      PT LONG TERM GOAL #1   Title  independent with HEP    Baseline  balance and breathing exercises added 11/7    Status  On-going  Target Date  10/20/17      PT LONG TERM GOAL #2   Title  demonstrate negative positional testing    Baseline  negative on 10/25 and 11/7    Status  Achieved      PT LONG TERM GOAL #3   Title  Pt will demonstrate improved use of VOR as indicated by ability to perform x 1 viewing in standing with wide BOS x 30 seconds with minimal reports of dizziness    Baseline  seated x 30 seconds    Status  On-going    Target Date  10/20/17      PT LONG TERM GOAL #4   Title  Pt will improve FGA  score to >/=23/30 to decr. falls risk.     Baseline  unchanged from eval 19/30    Status  Revised    Target Date  10/20/17      PT LONG TERM GOAL #5   Title  Pt will demonstrate effective diaphragmatic breathing to assist with management of BP during panic attacks.    Status  On-going    Target Date  10/20/17            Plan - 10/06/17 0914    Clinical Impression Statement  Pt with decreased anxiety and dizziness today.  Treatment session focused on assessment of progress towards LTG.  Pt has met 1/5 LTG due to resolved BPPV but continues to present with impaired balance during dynamic gait, dizziness and disequilibrium, and moderate risk for falls.  Pt would benefit from ongoing therapy to address impairments but due to financial concerns and transportation issues pt has elected to finish and D/C at next session.  Provided pt with updated HEP.  At next session will review and add final exercises to HEP to continue to address dizziness, balance and gait impairments at home.      Rehab Potential  Fair    PT Frequency  1x / week    PT Duration  2 weeks    PT Treatment/Interventions  ADLs/Self Care Home Management;Canalith Repostioning;Neuromuscular re-education;Balance training;Therapeutic exercise;Therapeutic activities;Functional mobility training;Stair training;Gait training;Patient/family education;Vestibular    PT Next Visit Plan  ASSESS BP EACH VISIT DUE TO HTN WITH ANXIETY.  Review and finalize  HEP; FOTO?? and D/C    Consulted and Agree with Plan of Care  Patient       Patient will benefit from skilled therapeutic intervention in order to improve the following deficits and impairments:  Decreased balance, Dizziness, Decreased mobility  Visit Diagnosis: Dizziness and giddiness - Plan: PT plan of care cert/re-cert  Unsteadiness on feet - Plan: PT plan of care cert/re-cert     Problem List Patient Active Problem List   Diagnosis Date Noted  . Nasal congestion 09/16/2017  . Vertigo 08/10/2017  . Candidiasis, intertrigo 04/02/2017  . Diastolic CHF ([place]) 60/45/4098  . 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 Hypothyroidism 11/17/2016  . Episode of recurrent major depressive disorder ([place])   . History of CVA (cerebrovascular accident)   . Pure hypercholesterolemia 11/06/2016  . Chronic venous insufficiency 11/05/2016  . Peripheral neuropathy 11/05/2016  . Cerebrovascular disease 06/09/2016  . Orthostatic hypotension 06/09/2016  . Hypokalemia 02/20/2015  . History of colonic polyps 06/04/2014  . Depression 03/29/2014  . Chronic constipation 03/29/2014  . ABDOMINAL PAIN, RECURRENT 03/29/2008  . Anxiety state 10/07/2007  . Essential hypertension 07/25/2007  . GERD 07/25/2007   Rico Junker, PT, DPT 10/06/17    9:24 AM  [facility] [address] [place], [place], [address] Phone: [phone]   Fax:  [phone]  Name: CIENA SAMPLEY MRN: [id number] Date of Birth: 1945-12-29

## 2017-10-07 ENCOUNTER — Encounter: Payer: Self-pay | Admitting: Family Medicine

## 2017-10-07 ENCOUNTER — Other Ambulatory Visit: Payer: Self-pay

## 2017-10-07 ENCOUNTER — Ambulatory Visit (INDEPENDENT_AMBULATORY_CARE_PROVIDER_SITE_OTHER): Payer: Medicare Other | Admitting: Family Medicine

## 2017-10-07 ENCOUNTER — Encounter: Payer: Self-pay | Admitting: Licensed Clinical Social Worker

## 2017-10-07 VITALS — BP 158/76 | HR 53 | Temp 98.8°F | Wt 206.0 lb

## 2017-10-07 DIAGNOSIS — R682 Dry mouth, unspecified: Secondary | ICD-10-CM | POA: Diagnosis not present

## 2017-10-07 DIAGNOSIS — F411 Generalized anxiety disorder: Secondary | ICD-10-CM

## 2017-10-07 DIAGNOSIS — E039 Hypothyroidism, unspecified: Secondary | ICD-10-CM | POA: Diagnosis not present

## 2017-10-07 MED ORDER — NYSTATIN 100000 UNIT/ML MT SUSP: 500000.0000 [IU] | Freq: Four times a day (QID) | OROMUCOSAL | 0 refills | Status: AC

## 2017-10-07 MED ORDER — ALBUTEROL SULFATE HFA 108 (90 BASE) MCG/ACT IN AERS: 2.0000 | INHALATION_SPRAY | RESPIRATORY_TRACT | 1 refills | Status: DC | PRN

## 2017-10-07 MED ORDER — VENLAFAXINE HCL ER 37.5 MG PO CP24: 37.5000 mg | ORAL_CAPSULE | Freq: Every day | ORAL | 4 refills | Status: DC
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
## 2017-10-07 NOTE — Progress Notes (Signed)
ESTIMATE TIME:20 minutes Type of Service: Integrated Behavioral Health warm handoff  Interpreter:No.   SUBJECTIVE: Erica Morris is a 71 y.o. female referred by Dr. Enid Derry for: symptoms of  anxiety. Patient reports she believes her anxiety is related to stressors with living with her daughter.  Daughter does not like her moving things in the house and this upsets patient when she is unable to move things when she cleans up. Patient states she has nothing to do during the day.  LIFE CONTEXT:  Erica Morris lives with daughter and Erica Morris  ,widow for 5 years  School / Work /Fun: patient spends most of her time at home alone, enjoys walking, watching TV and cleaning. Life changes: unable to walk due to limitation and TV is no longer working.    GOALS: Patient will reduce symptoms of: anxiety and stress, and increase ability JW:LKHVFM skills, self-management skills and stress reduction, . INTERVENTION: , Reflective listening, Charity fundraiser ; Motivational Interviewing and Behavioral Activation  ISSUES DISCUSSED: Integrated care services, support system, previous and current coping skills, Engineer, maintenance and things patient enjoy or use to enjoy doing,     ASSESSMENT:Patient currently experiencing symptoms of anxiety.  Symptoms exacerbated by spending most of her time at home along. Patient may benefit from, and is in agreement to look for community senior program that will allow her to get out of the house.  PLAN:   1. LCSW will F/U with phone call in 5 to 7 days  2. Referral:Community Resources:  Erica Morris,   3. Patient will call Senior Resources to review program  Warm Hand Off Completed.     Casimer Lanius, LCSW Licensed Clinical Social Worker [facility]   [phone] 2:04 PM
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
## 2017-10-07 NOTE — Assessment & Plan Note (Signed)
Patient reporting  symptoms including dry mouth and has not had a TSH level checked in 1 year, and we will repeat this testing today.

## 2017-10-07 NOTE — Assessment & Plan Note (Addendum)
Patient with 1-26-monthhistory of dry mouth.  Reports that she has been using Biotene which worse initially and is no longer working.  Patient given a nystatin rinse to determine whether or not this could be due to fungal infection.  She is to return in 1-2 weeks if there is no improvement with her dry mouth.  We will also obtain an ESR and CRP to rule out other causes of her dry mouth.  Patient does have a history of rash on bilateral elbows as well as bilateral shin rashes.  No other history of any autoimmune diseases and patient reports that she does not drink very many caffeinated beverages and drinks water multiple times a day.  Patient given instructions on how to improve her dry mouth.  She was encouraged to return in 1-2 weeks if there is no improvement of her symptoms.

## 2017-10-07 NOTE — Patient Instructions (Addendum)
Thank you for coming to see me today. It was a pleasure! Today we talked about:   Your anxiety and dry mouth. For your anxiety we have started you on Effexor.  Please take this daily.  This will not start to work until 4-6 weeks, so please do not stop the medication before this.  If you feel it is not helping you in 4-6 weeks please schedule an appointment to see Dr. Andria Frames.  For your dry mouth I have written you a prescription for nystatin.  Please swish and spit this 4 times a day.  If your dry mouth is not improving in 1 week please schedule follow-up appointment.  We had collected some lab work today to determine other causes for your dry mouth.  I will call you with these results.  If you have any questions or concerns, please do not hesitate to call the office at [phone].  Take Care,   Erica Shirley, DO   Preventive measures against dry mouth include: ?Maintenance of good hydration by taking regular sips of water, drinking sugar-free liquids, and avoidance of oral irritants (eg, coffee, alcohol, and nicotine). ?Avoidance of oral desiccants (eg, coffee, alcohol, tobacco and cannabis smoke). ?Avoidance of medications that may worsen oral dryness, especially those with anticholinergic side effects. These include over-the-counter cold and sleep remedies and other agents.  ?Maintenance of open nasal passages to avoid mouth breathing. ?Avoidance of low-humidity environments, such as air-conditioned stores, centrally heated houses, and airplanes; and the use of humidifiers to maintain adequate humidity, particularly at night.

## 2017-10-08 LAB — SEDIMENTATION RATE: Sed Rate: 6 mm/hr (ref 0–40)

## 2017-10-08 LAB — C-REACTIVE PROTEIN: CRP: 1.7 mg/L (ref 0.0–4.9)

## 2017-10-08 LAB — TSH: TSH: 5.07 u[IU]/mL — ABNORMAL HIGH (ref 0.450–4.500)

## 2017-10-11 ENCOUNTER — Ambulatory Visit: Payer: Medicare Other | Admitting: Student in an Organized Health Care Education/Training Program

## 2017-10-11 ENCOUNTER — Other Ambulatory Visit: Payer: Self-pay | Admitting: Family Medicine

## 2017-10-11 ENCOUNTER — Encounter: Payer: Self-pay | Admitting: Family Medicine

## 2017-10-11 ENCOUNTER — Telehealth: Payer: Self-pay | Admitting: *Deleted

## 2017-10-11 DIAGNOSIS — F410 Panic disorder [episodic paroxysmal anxiety] without agoraphobia: Secondary | ICD-10-CM | POA: Insufficient documentation

## 2017-10-11 DIAGNOSIS — J310 Chronic rhinitis: Secondary | ICD-10-CM | POA: Diagnosis not present

## 2017-10-11 DIAGNOSIS — E039 Hypothyroidism, unspecified: Secondary | ICD-10-CM

## 2017-10-11 MED ORDER — LEVOTHYROXINE SODIUM 50 MCG PO TABS: 50.0000 ug | ORAL_TABLET | Freq: Every day | ORAL | 4 refills | Status: DC

## 2017-10-11 NOTE — Progress Notes (Signed)
Attempted to call patient multiple times with results. Will send a letter, and continue to try to call with result from elevated TSH and instruct to take 50 mcg or 2 tablets of her 25 mcg. She will be instructed to return in 6 weeks for a recheck of her TSH level.

## 2017-10-11 NOTE — Telephone Encounter (Signed)
Patient aware of lab results and instructions and to expect letter in the mail with these results as well. Hubbard Hartshorn, RN, BSN

## 2017-10-12 ENCOUNTER — Telehealth: Payer: Self-pay | Admitting: Licensed Clinical Social Worker

## 2017-10-12 NOTE — Progress Notes (Signed)
Integrated Care f/u phone call to patient reference interventions discussed and resources provided during joint visit with PCP.   Patient reports she is taking Effexor as prescribed by Dr. Enid Derry but continues to experience difficulty with eating and sleeping.  Informed LCSW she has contacted [employer] day program and waiting for them to send information in the mail.  Patient requested that PCP contact her to discuss options that could provide her immediate relief of not being able to sleep, while she waits for the new medication to start working.    Plan: LCSW will forward message to Dr. Enid Derry and PCP.  Casimer Lanius, LCSW Licensed Clinical Social Worker [facility]   [phone] 2:04 PM

## 2017-10-13 ENCOUNTER — Emergency Department (HOSPITAL_COMMUNITY)
Admission: EM | Admit: 2017-10-13 | Discharge: 2017-10-13 | Disposition: A | Discharge status: Home / Self Care | Payer: Medicare Other | Attending: Emergency Medicine | Admitting: Emergency Medicine

## 2017-10-13 ENCOUNTER — Emergency Department (HOSPITAL_COMMUNITY): Payer: Medicare Other

## 2017-10-13 ENCOUNTER — Encounter (HOSPITAL_COMMUNITY): Payer: Self-pay | Admitting: *Deleted

## 2017-10-13 DIAGNOSIS — Z79899 Other long term (current) drug therapy: Secondary | ICD-10-CM | POA: Insufficient documentation

## 2017-10-13 DIAGNOSIS — R9431 Abnormal electrocardiogram [ECG] [EKG]: Secondary | ICD-10-CM | POA: Diagnosis not present

## 2017-10-13 DIAGNOSIS — I5030 Unspecified diastolic (congestive) heart failure: Secondary | ICD-10-CM | POA: Diagnosis not present

## 2017-10-13 DIAGNOSIS — I110 Hypertensive heart disease with heart failure: Secondary | ICD-10-CM | POA: Insufficient documentation

## 2017-10-13 DIAGNOSIS — E039 Hypothyroidism, unspecified: Secondary | ICD-10-CM | POA: Diagnosis not present

## 2017-10-13 DIAGNOSIS — Z7982 Long term (current) use of aspirin: Secondary | ICD-10-CM | POA: Insufficient documentation

## 2017-10-13 DIAGNOSIS — R51 Headache: Secondary | ICD-10-CM | POA: Diagnosis present

## 2017-10-13 DIAGNOSIS — R030 Elevated blood-pressure reading, without diagnosis of hypertension: Secondary | ICD-10-CM | POA: Diagnosis not present

## 2017-10-13 DIAGNOSIS — R519 Headache, unspecified: Secondary | ICD-10-CM

## 2017-10-13 LAB — CBC
HCT: 37.4 % (ref 36.0–46.0)
Hemoglobin: 12.4 g/dL (ref 12.0–15.0)
MCH: 26.6 pg (ref 26.0–34.0)
MCHC: 33.2 g/dL (ref 30.0–36.0)
MCV: 80.3 fL (ref 78.0–100.0)
Platelets: 258 10*3/uL (ref 150–400)
RBC: 4.66 MIL/uL (ref 3.87–5.11)
RDW: 13.3 % (ref 11.5–15.5)
WBC: 9.8 10*3/uL (ref 4.0–10.5)

## 2017-10-13 LAB — BASIC METABOLIC PANEL
Anion gap: 10 (ref 5–15)
BUN: 12 mg/dL (ref 6–20)
CO2: 22 mmol/L (ref 22–32)
Calcium: 9.2 mg/dL (ref 8.9–10.3)
Chloride: 106 mmol/L (ref 101–111)
Creatinine, Ser: 0.95 mg/dL (ref 0.44–1.00)
GFR calc Af Amer: >60 mL/min (ref >60)
GFR calc non Af Amer: 59 mL/min — ABNORMAL LOW (ref >60)
Glucose, Bld: 128 mg/dL — ABNORMAL HIGH (ref 65–99)
Potassium: 3 mmol/L — ABNORMAL LOW (ref 3.5–5.1)
Sodium: 138 mmol/L (ref 135–145)

## 2017-10-13 LAB — I-STAT TROPONIN, ED: Troponin i, poc: 0 ng/mL (ref 0.00–0.08)

## 2017-10-13 MED ORDER — DEXAMETHASONE SODIUM PHOSPHATE 10 MG/ML IJ SOLN
10.0000 mg | Freq: Once | INTRAMUSCULAR | Status: AC
  Administered 2017-10-13: 10 mg via INTRAMUSCULAR
  Filled 2017-10-13: qty 1

## 2017-10-13 MED ORDER — ACETAMINOPHEN 500 MG PO TABS
1000.0000 mg | ORAL_TABLET | Freq: Once | ORAL | Status: AC
  Administered 2017-10-13: 1000 mg via ORAL
  Filled 2017-10-13: qty 2

## 2017-10-13 MED ORDER — KETOROLAC TROMETHAMINE 30 MG/ML IJ SOLN
15.0000 mg | Freq: Once | INTRAMUSCULAR | Status: AC
  Administered 2017-10-13: 15 mg via INTRAVENOUS
  Filled 2017-10-13: qty 1

## 2017-10-13 NOTE — ED Notes (Signed)
Pt. Documented in error Saline Lock IV, Maintain IV access. 

## 2017-10-13 NOTE — ED Triage Notes (Signed)
Pt reports frontal h/a and dizziness which started yesterday.  Reports decreased appetite and dry mouth.  Pt is ambulatory without difficulty.

## 2017-10-13 NOTE — ED Provider Notes (Signed)
[facility] DEPT Provider Note   CSN: [id number] Arrival date & time: 10/13/17  0044     History   Chief Complaint Chief Complaint  Patient presents with  . Hypertension  . Headache    HPI Erica Morris is a 71 y.o. female.   Hypertension  This is a chronic problem. The current episode started more than 1 week ago. The problem occurs constantly. The problem has been gradually worsening. Associated symptoms include headaches. Pertinent negatives include no chest pain, no abdominal pain and no shortness of breath. Nothing aggravates the symptoms. Nothing relieves the symptoms. She has tried nothing for the symptoms. The treatment provided significant relief.  Headache   Pertinent negatives include no shortness of breath.  Frontal HA.  No focal deficits.  No sudden onset not the worst HA of her life.  No neck pain.    Past Medical History:  Diagnosis Date  . ABDOMINAL PAIN, RECURRENT 03/29/2008  . ANXIETY 10/07/2007  . Candidiasis of mouth 05/30/2010  . GERD 07/25/2007  . Hernia, hiatal   . HYPERTENSION 07/25/2007  . Stroke [facility])     Patient Active Problem List   Diagnosis Date Noted  . Dry mouth 10/07/2017  . Nasal congestion 09/16/2017  . Vertigo 08/10/2017  . Candidiasis, intertrigo 04/02/2017  . Diastolic CHF ([place]) 09/38/1829  . 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 Hypothyroidism 11/17/2016  . Episode of recurrent major depressive disorder ([place])   . History of CVA (cerebrovascular accident)   . Pure hypercholesterolemia 11/06/2016  . Chronic venous insufficiency 11/05/2016  . Peripheral neuropathy 11/05/2016  . Cerebrovascular disease 06/09/2016  . Orthostatic hypotension 06/09/2016  . Hypokalemia 02/20/2015  . History of colonic polyps 06/04/2014  . Depression 03/29/2014  . Chronic constipation 03/29/2014  . ABDOMINAL PAIN, RECURRENT 03/29/2008  . Anxiety state 10/07/2007  . Essential  hypertension 07/25/2007  . GERD 07/25/2007    Past Surgical History:  Procedure Laterality Date  . ABDOMINAL HYSTERECTOMY    . BREAST SURGERY     bx  . KNEE ARTHROSCOPY      OB History    No data available       Home Medications    Prior to Admission medications   Medication Sig Start Date End Date Taking? Authorizing Provider  albuterol (PROVENTIL HFA;VENTOLIN HFA) 108 (90 Base) MCG/ACT inhaler Inhale 2 puffs every 4 (four) hours as needed into the lungs for wheezing or shortness of breath. 10/07/17  Yes Shirley, [place], DO  aspirin EC 81 MG tablet Take 81 mg by mouth every morning.    Yes [provider]  atorvastatin (LIPITOR) 40 MG tablet Take 1 tablet (40 mg total) by mouth daily. 01/11/17  Yes Dickie La, MD  furosemide (LASIX) 20 MG tablet Take 1 tablet (20 mg total) by mouth daily. 03/10/17  Yes Hensel, Jamal Collin, MD  levothyroxine (SYNTHROID, LEVOTHROID) 50 MCG tablet Take 1 tablet (50 mcg total) daily before breakfast by mouth. 10/11/17  Yes Enid Derry, [place], DO  metoprolol tartrate (LOPRESSOR) 25 MG tablet Take 1 tablet (25 mg total) by mouth 2 (two) times daily. 09/10/17  Yes [place] Bing, DO  nystatin (MYCOSTATIN) 100000 UNIT/ML suspension Take 5 mLs (500,000 Units total) 4 (four) times daily for 10 days by mouth. 10/07/17 10/17/17 Yes Enid Derry, [place], DO  pantoprazole (PROTONIX) 40 MG tablet TAKE 1 TABLET(40 MG) BY MOUTH DAILY 07/19/17  Yes Hensel, Jamal Collin, MD  venlafaxine XR (EFFEXOR XR) 37.5 MG 24  hr capsule Take 1 capsule (37.5 mg total) daily with breakfast by mouth. 10/07/17  Yes Shirley, [place], DO    Family History Family History  Problem Relation Age of Onset  . Dementia Mother   . COPD Father   . Diabetes Sister   . Hypertension Sister   . Cancer Brother        Lung CA  . Clotting disorder Brother   . Kidney disease Neg Hx   . Stroke Neg Hx     Social History Social History   Tobacco Use  . Smoking status: Never Smoker  . Smokeless  tobacco: Never Used  Substance Use Topics  . Alcohol use: Yes    Comment: occasional  . Drug use: No     Allergies   Lisinopril; Codeine phosphate; and Morphine and related   Review of Systems Review of Systems  Eyes: Negative for photophobia.  Respiratory: Negative for shortness of breath.   Cardiovascular: Negative for chest pain.  Gastrointestinal: Negative for abdominal pain.  Musculoskeletal: Negative for neck stiffness.  Neurological: Positive for headaches. Negative for dizziness, tremors, seizures, syncope, facial asymmetry, speech difficulty, weakness, light-headedness and numbness.  All other systems reviewed and are negative.    Physical Exam Updated Vital Signs BP (!) 150/60   Pulse 60   Temp 97.9 F (36.6 C) (Oral)   Resp 17   SpO2 100%   Physical Exam  Constitutional: She is oriented to person, place, and time. She appears well-developed and well-nourished. No distress.  Smiling in the room with all the lights on  HENT:  Head: Normocephalic and atraumatic.  Mouth/Throat: No oropharyngeal exudate.  Eyes: Conjunctivae and EOM are normal. Pupils are equal, round, and reactive to light.  Neck: Normal range of motion. Neck supple.  Cardiovascular: Normal rate, regular rhythm, normal heart sounds and intact distal pulses.  Pulmonary/Chest: Effort normal and breath sounds normal. No stridor. No respiratory distress.  Abdominal: Soft. Bowel sounds are normal. She exhibits no mass. There is no tenderness. There is no guarding.  Musculoskeletal: Normal range of motion.  Neurological: She is alert and oriented to person, place, and time. She displays normal reflexes. No cranial nerve deficit. She exhibits normal muscle tone.  Skin: Skin is warm and dry. Capillary refill takes less than 2 seconds.  Psychiatric: She has a normal mood and affect.     ED Treatments / Results  Labs (all labs ordered are listed, but only abnormal results are displayed) Labs Reviewed   BASIC METABOLIC PANEL - Abnormal; Notable for the following components:      Result Value   Potassium 3.0 (*)    Glucose, Bld 128 (*)    GFR calc non Af Amer 59 (*)    All other components within normal limits  CBC  I-STAT TROPONIN, ED    EKG  EKG Interpretation  Date/Time:  Wednesday October 13 2017 03:23:08 EST Ventricular Rate:  55 PR Interval:    QRS Duration: 95 QT Interval:  454 QTC Calculation: 435 R Axis:   57 Text Interpretation:  Sinus rhythm Confirmed by Dory Horn) on 10/13/2017 5:37:04 AM       Radiology Ct Head Wo Contrast  Result Date: 10/13/2017 CLINICAL DATA:  Acute onset of headache and dizziness. EXAM: CT HEAD WITHOUT CONTRAST TECHNIQUE: Contiguous axial images were obtained from the base of the skull through the vertex without intravenous contrast. COMPARISON:  CT of the head performed 08/08/2017, and MRI of the brain performed 03/23/2016 FINDINGS: Brain:  No evidence of acute infarction, hemorrhage, hydrocephalus, extra-axial collection or mass lesion/mass effect. Scattered periventricular and subcortical white matter change likely reflects small vessel ischemic microangiopathy. The posterior fossa, including the cerebellum, brainstem and fourth ventricle, is within normal limits. The third and lateral ventricles, and basal ganglia are unremarkable in appearance. The cerebral hemispheres are symmetric in appearance, with normal gray-white differentiation. No mass effect or midline shift is seen. Vascular: No hyperdense vessel or unexpected calcification. Skull: There is no evidence of fracture; visualized osseous structures are unremarkable in appearance. Sinuses/Orbits: The visualized portions of the orbits are within normal limits. The paranasal sinuses and mastoid air cells are well-aerated. Other: No significant soft tissue abnormalities are seen. IMPRESSION: 1. No acute intracranial pathology seen on CT. 2. Scattered small vessel ischemic  microangiopathy. Electronically Signed   By: Garald Balding M.D.   On: 10/13/2017 04:35    Procedures Procedures (including critical care time)  Medications Ordered in ED Medications  acetaminophen (TYLENOL) tablet 1,000 mg (1,000 mg Oral Given 10/13/17 0337)  dexamethasone (DECADRON) injection 10 mg (10 mg Intramuscular Given 10/13/17 0337)  ketorolac (TORADOL) 30 MG/ML injection 15 mg (15 mg Intravenous Given 10/13/17 0635)      Final Clinical Impressions(s) / ED Diagnoses   Final diagnoses:  Nonintractable episodic headache, unspecified headache type   Pressure went down without intervention.     All questions answered to the patient's satisfaction.   Strict return precautions for swelling or the lips or tongue, chest pain, dyspnea on exertion, new weakness or numbness changes in vision or speech, fevers, weakness persistent pain, Inability to tolerate liquids or food, changes in voice cough, altered mental status or any concerns. No signs of systemic illness or infection. The patient is nontoxic-appearing on exam and vital signs are within normal limits.    I have reviewed the triage vital signs and the nursing notes. Pertinent labs &imaging results that were available during my care of the patient were reviewed by me and considered in my medical decision making (see chart for details).  After history, exam, and medical workup I feel the patient has been appropriately medically screened and is safe for discharge home. Pertinent diagnoses were discussed with the patient. Patient was given return precautions.   Palumbo, April, MD 10/13/17 [phone]

## 2017-10-14 ENCOUNTER — Encounter: Payer: Self-pay | Admitting: Physical Therapy

## 2017-10-14 ENCOUNTER — Telehealth: Payer: Self-pay | Admitting: Physical Therapy

## 2017-10-14 ENCOUNTER — Ambulatory Visit: Payer: Medicare Other | Admitting: Physical Therapy

## 2017-10-14 DIAGNOSIS — R2681 Unsteadiness on feet: Secondary | ICD-10-CM | POA: Diagnosis not present

## 2017-10-14 DIAGNOSIS — R42 Dizziness and giddiness: Secondary | ICD-10-CM | POA: Diagnosis not present

## 2017-10-14 NOTE — Therapy (Signed)
[facility] [address] [place], [place], [address] Phone: [phone]   Fax:  [phone]  Patient Details  Name: ELISSA GRIESHOP MRN: [id number] Date of Birth: 05/15/1946 Referring Provider:  No ref. provider found  Encounter Date: 10/14/2017  PHYSICAL THERAPY DISCHARGE SUMMARY  Visits from Start of Care: 5  Current functional level related to goals / functional outcomes: Able to resolve patient's positional vertigo but unable to assess remainder LTG due to pt not returning for final visit.  Pt declined offer to reschedule final visit due to transportation, financial limitations and other medical health concerns.  Pt requested to be D/C.   Remaining deficits: Disequilibrium and anxiety related dizziness, impaired balance, falls risk   Education / Equipment: HEP  Plan: Patient agrees to discharge.  Patient goals were not met. Patient is being discharged due to the patient's request.  ?????    Rico Junker, PT, DPT 10/14/17    8:35 AM    [facility] [address] [place], [place], [address] Phone: [phone]   Fax:  [phone]

## 2017-10-14 NOTE — Telephone Encounter (Signed)
Contacted pt due to missed visit this morning.  Pt states she has not been feeling well and went to the hospital yesterday because of a serious headache.  Pt continues to report high BP, panic attacks, dry mouth and difficulty breathing causing her to not be able to sleep/rest at night.  Attempted to review and re-educate pt on relationship between anxiety/panic attacks, elevated BP and vertigo and to continue to discuss with PCP ways to manage stress/anxiety and BP.  Offered to reschedule final visit for PT.  Pt states she does not wish to continue with therapy at this time; pt reports having difficulty with transportation, financial issues and would like to focus on managing other health concerns.  Will D/C pt at this time.

## 2017-10-15 ENCOUNTER — Telehealth: Payer: Self-pay | Admitting: Family Medicine

## 2017-10-15 NOTE — Telephone Encounter (Signed)
**  After Hours/ Emergency Line Call*  Received a call to report that Erica Morris has been feeling anxious for the past week.  She started venlafaxine on 10/07/2017 after being seen by Dr. Enid Derry.  She denies thoughts of self harm or harming others. She is asking for me to prescribe her something. I recommended that she try deep breathing and try to go to sleep. If she continues to feel panicked she can come to the ED. Denies any CP, SOB, suicidal or homicidal thoughts. Recommended that patient make appointment to see PCP next week. Will forward to PCP.   Eloise Levels, MD PGY-2, [facility]

## 2017-10-18 ENCOUNTER — Ambulatory Visit (INDEPENDENT_AMBULATORY_CARE_PROVIDER_SITE_OTHER): Payer: Medicare Other | Admitting: Family Medicine

## 2017-10-18 ENCOUNTER — Other Ambulatory Visit: Payer: Self-pay

## 2017-10-18 ENCOUNTER — Encounter: Payer: Self-pay | Admitting: Psychology

## 2017-10-18 ENCOUNTER — Encounter: Payer: Self-pay | Admitting: Family Medicine

## 2017-10-18 VITALS — BP 160/80 | HR 78 | Temp 98.4°F | Ht 64.0 in | Wt 199.4 lb

## 2017-10-18 DIAGNOSIS — F411 Generalized anxiety disorder: Secondary | ICD-10-CM | POA: Diagnosis not present

## 2017-10-18 DIAGNOSIS — R682 Dry mouth, unspecified: Secondary | ICD-10-CM

## 2017-10-18 MED ORDER — VENLAFAXINE HCL ER 75 MG PO CP24: 75.0000 mg | ORAL_CAPSULE | Freq: Every day | ORAL | 1 refills | Status: DC

## 2017-10-18 NOTE — Patient Instructions (Signed)
Panic Attacks Panic attacks are sudden, short feelings of great fear or discomfort. You may have them for no reason when you are relaxed, when you are uneasy (anxious), or when you are sleeping. Follow these instructions at home:  Take all your medicines as told.  Check with your doctor before starting new medicines.  Keep all doctor visits. Contact a doctor if:  You are not able to take your medicines as told.  Your symptoms do not get better.  Your symptoms get worse. Get help right away if:  Your attacks seem different than your normal attacks.  You have thoughts about hurting yourself or others.  You take panic attack medicine and you have a side effect. This information is not intended to replace advice given to you by your health care provider. Make sure you discuss any questions you have with your health care provider. Document Released: 12/19/2010 Document Revised: 04/23/2016 Document Reviewed: 06/30/2013 Elsevier Interactive Patient Education  2017 [employer].   It was a pleasure meeting you today.   Today we discussed your anxiety and dry mouth.  For you anxiety: I have increased your venlafaxine to 75 mg daily. I have sent this prescription into the pharmacy. Due to your age I strongly recommend you not be on a medication like a benzo. I also recommend continued follow up with Behavioral health.   For your dry mouth: before we do invasive testing I recommend conservative measures at this point. Please increase your fluid intake. If you notice worsening dry mouth with the increased effexor dose I would call the clinic so we can change this medication.   I highly recommend you follow up with your PCP, Dr. Andria Frames, in the next 2 weeks.   Please follow up in 2 weeks with Dr. Andria Frames or sooner if symptoms persist or worsen. Please call the clinic immediately if you have concerns.  If you have thoughts of harming yourself or others call 9-1-1  Our clinic's number is  [phone]. Please call with questions or concerns.   Thank you,  Caroline More, DO
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
## 2017-10-18 NOTE — Assessment & Plan Note (Signed)
Patient today with no improvement in dry mouth since starting nystatin rinse. Patient reports that she does not have a taste for food due to dry mouth. No dry eyes or other mucous membranes dry per patient. No history of autoimmune disorders. Recently increased synthroid dosing on 10/11/17 -conservative measures. Encouraged increased oral hydration -follow up in 2 weeks with PCP. If continued dry mouth or other mucous membranes dry consider further testing for Sjogren's syndrome.

## 2017-10-25 ENCOUNTER — Ambulatory Visit: Payer: Medicare Other

## 2017-10-26 ENCOUNTER — Ambulatory Visit (INDEPENDENT_AMBULATORY_CARE_PROVIDER_SITE_OTHER): Payer: Medicare Other | Admitting: Student

## 2017-10-26 ENCOUNTER — Encounter: Payer: Self-pay | Admitting: Student

## 2017-10-26 ENCOUNTER — Other Ambulatory Visit: Payer: Self-pay

## 2017-10-26 VITALS — BP 140/68 | HR 68 | Temp 98.2°F | Ht 64.0 in | Wt 196.2 lb

## 2017-10-26 DIAGNOSIS — J209 Acute bronchitis, unspecified: Secondary | ICD-10-CM | POA: Diagnosis not present

## 2017-10-26 MED ORDER — AEROCHAMBER PLUS MISC: 2 refills | Status: DC

## 2017-10-26 MED ORDER — ALBUTEROL SULFATE HFA 108 (90 BASE) MCG/ACT IN AERS
2.0000 | INHALATION_SPRAY | RESPIRATORY_TRACT | 1 refills | Status: DC | PRN
Start: 2017-10-26 — End: 2018-01-09

## 2017-10-26 MED ORDER — PREDNISONE 50 MG PO TABS: 50.0000 mg | ORAL_TABLET | Freq: Every day | ORAL | 0 refills | Status: DC

## 2017-10-26 MED ORDER — AZITHROMYCIN 250 MG PO TABS: ORAL_TABLET | ORAL | 0 refills | Status: DC

## 2017-10-26 NOTE — Progress Notes (Signed)
  Subjective:    Erica Morris is a 71 y.o. old female here for cough.  She is here with her daughter.  HPI Cough: this has been going for two weeks. Cough is dry. She also have shortness of breath. She used dauhgters albuterol that helped her a little bit. She also reports poor appetite and fatigue. She think she is wheezing. She reports nausea but no emesis or diarrhea. She denies fever, runny nose or congestion.  She says her throat is burning. Tried muccinex and chest congestion stuff without help. Cough is getting worse.   PMH/Problem List: has Anxiety state; Essential hypertension; GERD; ABDOMINAL PAIN, RECURRENT; Depression; Chronic constipation; History of colonic polyps; Hypokalemia; Cerebrovascular disease; Orthostatic hypotension; Chronic venous insufficiency; Peripheral neuropathy; Pure hypercholesterolemia; Episode of recurrent major depressive disorder ([place]); History of CVA (cerebrovascular accident); Hypothyroidism; Diastolic CHF ([place]); Osteoarthritis of spine with radiculopathy, cervical region; Candidiasis, intertrigo; Vertigo; Nasal congestion; and Dry mouth on their problem list.   has a past medical history of ABDOMINAL PAIN, RECURRENT (03/29/2008), ANXIETY (10/07/2007), Candidiasis of mouth (05/30/2010), GERD (07/25/2007), Hernia, hiatal, HYPERTENSION (07/25/2007), and Stroke ([place]).  FH:  Family History  Problem Relation Age of Onset  . Dementia Mother   . COPD Father   . Diabetes Sister   . Hypertension Sister   . Cancer Brother        Lung CA  . Clotting disorder Brother   . Kidney disease Neg Hx   . Stroke Neg Hx     SH Social History   Tobacco Use  . Smoking status: Never Smoker  . Smokeless tobacco: Never Used  Substance Use Topics  . Alcohol use: Yes    Comment: occasional  . Drug use: No    Review of Systems Review of systems negative except for pertinent positives and negatives in history of present illness above.     Objective:     Vitals:   10/26/17 0940   BP: 140/68  Pulse: 68  Temp: 98.2 F (36.8 C)  TempSrc: Oral  SpO2: 95%  Weight: 196 lb 3.2 oz (89 kg)  Height: 5\' 4"  (1.626 m)   Body mass index is 33.68 kg/m.  Physical Exam GEN: appears well, no ditress EYES: PERRL, EOMI THROAT: MMM, no erythema or exudation NECK: Supple, no lymphadenopathy RESP:  No IWOB, no wheeze or crackles CVS:  RRR, normal S1&S2, no murmurs GI: soft, NT with active BS NEURO: Grossly intact PSYCH: normal affect    Assessment and Plan:  1. Acute bronchitis, unspecified organism: Symptoms concerning for acute bronchitis.  She has no history of COPD but his symptoms improved with daughters albuterol.  She also have albuterol prescription on a file.  She has history of CHF but does not seem to have fluid overload.  Weight is lower than baseline weight.  We will treat her with azithromycin, prednisone and albuterol for acute bronchitis.  Discussed return precautions.  Return if symptoms worsen or fail to improve.  Mercy Riding, MD 10/26/17 Pager: [phone]

## 2017-10-26 NOTE — Patient Instructions (Signed)
It was great seeing you today! We have addressed the following issues today  Cough: This is likely bronchitis.  We gave you a prescription for steroid, antibiotic and albuterol inhaler.  I also recommend you try a tablespoonful of honey before bedtime.  You can continue using Mucinex twice a day.  In addition, I recommend adequate hydration and rest.  Cough might last up to 5 weeks to resolve completely.  However, you should start feeling better in the next week.   - A tablespoonful of honey before bedtime is helpful for cough - Get plenty of rest and adequate hydration. - Consume warm fluids (soup or tea) to provide relief for a stuffy nose and to loosen phlegm. - For nasal stuffiness, try saline nasal spray or a Neti Pot. Eating warm liquids such as chicken soup or tea may also help with nasal congestion. - For sore throat pain relief: suck on throat lozenges, hard candy or popsicles; gargle with warm salt water (1/4 tsp. salt per 8 oz. of water); and eat soft, bland foods. - Eat a well-balanced diet. If you cannot, ensure you are getting enough nutrients by taking a daily multivitamin. - Avoid dairy products, as they can thicken phlegm. - Avoid alcohol, as it impairs your body's immune system.  CONTACT YOUR DOCTOR IF YOU EXPERIENCE ANY OF THE FOLLOWING: - High fever, chest pain, shortness of breath or  not able to keep down food or fluids.  - Cough that gets worse while other cold symptoms improve - Flare up of any chronic lung problem, such as asthma - Your symptoms persist longer than 2 weeks     If we did any lab work today, and the results require attention, either me or my nurse will get in touch with you. If everything is normal, you will get a letter in mail and a message via . If you don't hear from [place] in two weeks, please give [place] a call. Otherwise, we look forward to seeing you again at your next visit. If you have any questions or concerns before then, please call the clinic at  [phone].  Please bring all your medications to every doctors visit  Sign up for My Chart to have easy access to your labs results, and communication with your Primary care physician.    Please check-out at the front desk before leaving the clinic.    Take Care,   Dr. Cyndia Skeeters

## 2017-10-27 ENCOUNTER — Telehealth: Payer: Self-pay | Admitting: Family Medicine

## 2017-10-27 NOTE — Telephone Encounter (Signed)
Melissa called back. Mother in law is not gettign better. She is hurtign in her back an chest and feels her heart is racing.Lenna Sciara wants to know what to do.

## 2017-10-27 NOTE — Telephone Encounter (Signed)
Pt daughter in law called and said pt was diagnosed with bronchitis yesterday at her appt. Pt had been doing a lot of coughing before but today its been far worse and she's hurting in her rib area. She would like to know if there is anything for her to do for the pain. Please call the daughter in laws phone at [phone]

## 2017-10-27 NOTE — Telephone Encounter (Signed)
Called and discussed.  Still coughing - not a surprise after less than 24 hours of treatment.  Heart racing - again not a surprise since using albuterol q4-6h.  No Chest pain.  No fever.  Advised to observe for now.

## 2017-10-28 ENCOUNTER — Other Ambulatory Visit: Payer: Self-pay

## 2017-10-28 ENCOUNTER — Inpatient Hospital Stay (HOSPITAL_COMMUNITY)
Admission: EM | Admit: 2017-10-28 | Discharge: 2017-11-03 | DRG: 280 | Disposition: A | Discharge status: Home / Self Care | Payer: Medicare Other | Attending: Internal Medicine | Admitting: Internal Medicine

## 2017-10-28 ENCOUNTER — Emergency Department (HOSPITAL_COMMUNITY): Payer: Medicare Other

## 2017-10-28 ENCOUNTER — Inpatient Hospital Stay (HOSPITAL_COMMUNITY): Payer: Medicare Other

## 2017-10-28 ENCOUNTER — Encounter (HOSPITAL_COMMUNITY): Payer: Self-pay | Admitting: Family Medicine

## 2017-10-28 DIAGNOSIS — Z825 Family history of asthma and other chronic lower respiratory diseases: Secondary | ICD-10-CM | POA: Diagnosis not present

## 2017-10-28 DIAGNOSIS — I10 Essential (primary) hypertension: Secondary | ICD-10-CM | POA: Diagnosis not present

## 2017-10-28 DIAGNOSIS — J209 Acute bronchitis, unspecified: Secondary | ICD-10-CM | POA: Diagnosis present

## 2017-10-28 DIAGNOSIS — I5032 Chronic diastolic (congestive) heart failure: Secondary | ICD-10-CM | POA: Diagnosis present

## 2017-10-28 DIAGNOSIS — G629 Polyneuropathy, unspecified: Secondary | ICD-10-CM | POA: Diagnosis present

## 2017-10-28 DIAGNOSIS — E785 Hyperlipidemia, unspecified: Secondary | ICD-10-CM | POA: Diagnosis present

## 2017-10-28 DIAGNOSIS — J309 Allergic rhinitis, unspecified: Secondary | ICD-10-CM | POA: Diagnosis present

## 2017-10-28 DIAGNOSIS — J441 Chronic obstructive pulmonary disease with (acute) exacerbation: Secondary | ICD-10-CM | POA: Diagnosis present

## 2017-10-28 DIAGNOSIS — Z86010 Personal history of colonic polyps: Secondary | ICD-10-CM

## 2017-10-28 DIAGNOSIS — Z79890 Hormone replacement therapy (postmenopausal): Secondary | ICD-10-CM | POA: Diagnosis not present

## 2017-10-28 DIAGNOSIS — E876 Hypokalemia: Secondary | ICD-10-CM | POA: Diagnosis present

## 2017-10-28 DIAGNOSIS — Z6834 Body mass index (BMI) 34.0-34.9, adult: Secondary | ICD-10-CM

## 2017-10-28 DIAGNOSIS — N390 Urinary tract infection, site not specified: Secondary | ICD-10-CM | POA: Diagnosis present

## 2017-10-28 DIAGNOSIS — F419 Anxiety disorder, unspecified: Secondary | ICD-10-CM | POA: Diagnosis present

## 2017-10-28 DIAGNOSIS — N2889 Other specified disorders of kidney and ureter: Secondary | ICD-10-CM | POA: Diagnosis present

## 2017-10-28 DIAGNOSIS — I50810 Right heart failure, unspecified: Secondary | ICD-10-CM | POA: Diagnosis present

## 2017-10-28 DIAGNOSIS — R1084 Generalized abdominal pain: Secondary | ICD-10-CM | POA: Diagnosis not present

## 2017-10-28 DIAGNOSIS — Z7982 Long term (current) use of aspirin: Secondary | ICD-10-CM | POA: Diagnosis not present

## 2017-10-28 DIAGNOSIS — R001 Bradycardia, unspecified: Secondary | ICD-10-CM | POA: Diagnosis present

## 2017-10-28 DIAGNOSIS — R109 Unspecified abdominal pain: Secondary | ICD-10-CM | POA: Diagnosis not present

## 2017-10-28 DIAGNOSIS — Z90710 Acquired absence of both cervix and uterus: Secondary | ICD-10-CM

## 2017-10-28 DIAGNOSIS — K219 Gastro-esophageal reflux disease without esophagitis: Secondary | ICD-10-CM | POA: Diagnosis present

## 2017-10-28 DIAGNOSIS — R05 Cough: Secondary | ICD-10-CM | POA: Diagnosis present

## 2017-10-28 DIAGNOSIS — R9431 Abnormal electrocardiogram [ECG] [EKG]: Secondary | ICD-10-CM | POA: Diagnosis not present

## 2017-10-28 DIAGNOSIS — J440 Chronic obstructive pulmonary disease with acute lower respiratory infection: Secondary | ICD-10-CM | POA: Diagnosis present

## 2017-10-28 DIAGNOSIS — R0789 Other chest pain: Secondary | ICD-10-CM | POA: Diagnosis not present

## 2017-10-28 DIAGNOSIS — I340 Nonrheumatic mitral (valve) insufficiency: Secondary | ICD-10-CM

## 2017-10-28 DIAGNOSIS — Z801 Family history of malignant neoplasm of trachea, bronchus and lung: Secondary | ICD-10-CM

## 2017-10-28 DIAGNOSIS — Z888 Allergy status to other drugs, medicaments and biological substances status: Secondary | ICD-10-CM

## 2017-10-28 DIAGNOSIS — R0602 Shortness of breath: Secondary | ICD-10-CM | POA: Diagnosis present

## 2017-10-28 DIAGNOSIS — R7989 Other specified abnormal findings of blood chemistry: Secondary | ICD-10-CM | POA: Diagnosis not present

## 2017-10-28 DIAGNOSIS — Z8249 Family history of ischemic heart disease and other diseases of the circulatory system: Secondary | ICD-10-CM

## 2017-10-28 DIAGNOSIS — R21 Rash and other nonspecific skin eruption: Secondary | ICD-10-CM | POA: Diagnosis present

## 2017-10-28 DIAGNOSIS — Z833 Family history of diabetes mellitus: Secondary | ICD-10-CM | POA: Diagnosis not present

## 2017-10-28 DIAGNOSIS — Z885 Allergy status to narcotic agent status: Secondary | ICD-10-CM

## 2017-10-28 DIAGNOSIS — R778 Other specified abnormalities of plasma proteins: Secondary | ICD-10-CM

## 2017-10-28 DIAGNOSIS — B369 Superficial mycosis, unspecified: Secondary | ICD-10-CM | POA: Diagnosis present

## 2017-10-28 DIAGNOSIS — Z8673 Personal history of transient ischemic attack (TIA), and cerebral infarction without residual deficits: Secondary | ICD-10-CM

## 2017-10-28 DIAGNOSIS — J189 Pneumonia, unspecified organism: Secondary | ICD-10-CM | POA: Diagnosis not present

## 2017-10-28 DIAGNOSIS — I200 Unstable angina: Secondary | ICD-10-CM | POA: Diagnosis not present

## 2017-10-28 DIAGNOSIS — E7800 Pure hypercholesterolemia, unspecified: Secondary | ICD-10-CM | POA: Diagnosis present

## 2017-10-28 DIAGNOSIS — R748 Abnormal levels of other serum enzymes: Secondary | ICD-10-CM | POA: Diagnosis not present

## 2017-10-28 DIAGNOSIS — I110 Hypertensive heart disease with heart failure: Secondary | ICD-10-CM | POA: Diagnosis present

## 2017-10-28 DIAGNOSIS — R079 Chest pain, unspecified: Secondary | ICD-10-CM | POA: Diagnosis not present

## 2017-10-28 DIAGNOSIS — Z832 Family history of diseases of the blood and blood-forming organs and certain disorders involving the immune mechanism: Secondary | ICD-10-CM

## 2017-10-28 DIAGNOSIS — I214 Non-ST elevation (NSTEMI) myocardial infarction: Principal | ICD-10-CM | POA: Diagnosis present

## 2017-10-28 DIAGNOSIS — R059 Cough, unspecified: Secondary | ICD-10-CM | POA: Diagnosis present

## 2017-10-28 DIAGNOSIS — E039 Hypothyroidism, unspecified: Secondary | ICD-10-CM | POA: Diagnosis present

## 2017-10-28 DIAGNOSIS — R0982 Postnasal drip: Secondary | ICD-10-CM | POA: Diagnosis present

## 2017-10-28 LAB — URINALYSIS, ROUTINE W REFLEX MICROSCOPIC
Glucose, UA: NEGATIVE mg/dL
Ketones, ur: 20 mg/dL — AB
Nitrite: NEGATIVE
Protein, ur: 100 mg/dL — AB
Specific Gravity, Urine: 1.026 (ref 1.005–1.030)
pH: 5 (ref 5.0–8.0)

## 2017-10-28 LAB — BRAIN NATRIURETIC PEPTIDE: B Natriuretic Peptide: 271.5 pg/mL — ABNORMAL HIGH (ref 0.0–100.0)

## 2017-10-28 LAB — INFLUENZA PANEL BY PCR (TYPE A & B)
Influenza A By PCR: NEGATIVE
Influenza B By PCR: NEGATIVE

## 2017-10-28 LAB — TROPONIN I
Troponin I: 1.64 ng/mL (ref <0.03)
Troponin I: 1.49 ng/mL (ref <0.03)
Troponin I: 1.07 ng/mL (ref <0.03)

## 2017-10-28 LAB — ECHOCARDIOGRAM COMPLETE
Height: 64 in
Weight: 3184 oz

## 2017-10-28 LAB — RESPIRATORY PANEL BY PCR

## 2017-10-28 LAB — BASIC METABOLIC PANEL WITH GFR
Anion gap: 10 (ref 5–15)
BUN: 10 mg/dL (ref 6–20)
CO2: 24 mmol/L (ref 22–32)
Calcium: 8.8 mg/dL — ABNORMAL LOW (ref 8.9–10.3)
Chloride: 105 mmol/L (ref 101–111)
Creatinine, Ser: 0.87 mg/dL (ref 0.44–1.00)
GFR calc Af Amer: >60 mL/min (ref >60)
GFR calc non Af Amer: >60 mL/min (ref >60)
Glucose, Bld: 127 mg/dL — ABNORMAL HIGH (ref 65–99)
Potassium: 3.2 mmol/L — ABNORMAL LOW (ref 3.5–5.1)
Sodium: 139 mmol/L (ref 135–145)

## 2017-10-28 LAB — LIPID PANEL
Cholesterol: 163 mg/dL (ref 0–200)
HDL: 41 mg/dL (ref >40)
LDL Cholesterol: 109 mg/dL — ABNORMAL HIGH (ref 0–99)
Total CHOL/HDL Ratio: 4 RATIO
Triglycerides: 63 mg/dL (ref <150)
VLDL: 13 mg/dL (ref 0–40)

## 2017-10-28 LAB — CBC WITH DIFFERENTIAL/PLATELET
Basophils Absolute: 0 K/uL (ref 0.0–0.1)
Basophils Relative: 0 %
Eosinophils Absolute: 0 K/uL (ref 0.0–0.7)
Eosinophils Relative: 0 %
HCT: 36.4 % (ref 36.0–46.0)
Hemoglobin: 12 g/dL (ref 12.0–15.0)
Lymphocytes Relative: 8 %
Lymphs Abs: 1.3 K/uL (ref 0.7–4.0)
MCH: 26.5 pg (ref 26.0–34.0)
MCHC: 33 g/dL (ref 30.0–36.0)
MCV: 80.5 fL (ref 78.0–100.0)
Monocytes Absolute: 1.5 K/uL — ABNORMAL HIGH (ref 0.1–1.0)
Monocytes Relative: 9 %
Neutro Abs: 14.1 K/uL — ABNORMAL HIGH (ref 1.7–7.7)
Neutrophils Relative %: 83 %
Platelets: 339 K/uL (ref 150–400)
RBC: 4.52 MIL/uL (ref 3.87–5.11)
RDW: 13.8 % (ref 11.5–15.5)
WBC: 16.9 K/uL — ABNORMAL HIGH (ref 4.0–10.5)

## 2017-10-28 LAB — HEPATIC FUNCTION PANEL
ALT: 20 U/L (ref 14–54)
AST: 35 U/L (ref 15–41)
Albumin: 3.5 g/dL (ref 3.5–5.0)
Alkaline Phosphatase: 111 U/L (ref 38–126)
Bilirubin, Direct: 0.2 mg/dL (ref 0.1–0.5)
Indirect Bilirubin: 0.8 mg/dL (ref 0.3–0.9)
Total Bilirubin: 1 mg/dL (ref 0.3–1.2)
Total Protein: 7 g/dL (ref 6.5–8.1)

## 2017-10-28 LAB — MAGNESIUM: Magnesium: 1.8 mg/dL (ref 1.7–2.4)

## 2017-10-28 LAB — I-STAT TROPONIN, ED: Troponin i, poc: 1.75 ng/mL (ref 0.00–0.08)

## 2017-10-28 LAB — HEMOGLOBIN A1C
Hgb A1c MFr Bld: 5.5 % (ref 4.8–5.6)
Mean Plasma Glucose: 111.15 mg/dL

## 2017-10-28 LAB — RAPID URINE DRUG SCREEN, HOSP PERFORMED
Amphetamines: NOT DETECTED
Barbiturates: NOT DETECTED
Benzodiazepines: NOT DETECTED
Cocaine: NOT DETECTED
Opiates: NOT DETECTED
Tetrahydrocannabinol: NOT DETECTED

## 2017-10-28 LAB — CK TOTAL AND CKMB (NOT AT ARMC)
CK, MB: 22.3 ng/mL — ABNORMAL HIGH (ref 0.5–5.0)
Relative Index: 7 — ABNORMAL HIGH (ref 0.0–2.5)
Total CK: 319 U/L — ABNORMAL HIGH (ref 38–234)

## 2017-10-28 LAB — PROTIME-INR
INR: 1.19
Prothrombin Time: 15 seconds (ref 11.4–15.2)

## 2017-10-28 LAB — LIPASE, BLOOD: Lipase: 17 U/L (ref 11–51)

## 2017-10-28 LAB — HEPARIN LEVEL (UNFRACTIONATED): Heparin Unfractionated: 0.14 IU/mL — ABNORMAL LOW (ref 0.30–0.70)

## 2017-10-28 MED ORDER — DOXYCYCLINE HYCLATE 100 MG PO TABS
100.0000 mg | ORAL_TABLET | Freq: Two times a day (BID) | ORAL | Status: DC
  Administered 2017-10-28 – 2017-10-29 (×3): 100 mg via ORAL
  Filled 2017-10-28 (×3): qty 1

## 2017-10-28 MED ORDER — IOPAMIDOL (ISOVUE-300) INJECTION 61%: 15.0000 mL | Freq: Once | INTRAVENOUS | Status: DC | PRN

## 2017-10-28 MED ORDER — IPRATROPIUM-ALBUTEROL 0.5-2.5 (3) MG/3ML IN SOLN
3.0000 mL | RESPIRATORY_TRACT | Status: DC
  Administered 2017-10-28 – 2017-10-29 (×6): 3 mL via RESPIRATORY_TRACT
  Filled 2017-10-28 (×6): qty 3

## 2017-10-28 MED ORDER — HYDROCOD POLST-CPM POLST ER 10-8 MG/5ML PO SUER
5.0000 mL | Freq: Once | ORAL | Status: AC
Start: 2017-10-28 — End: 2017-10-28
  Administered 2017-10-28: 5 mL via ORAL
  Filled 2017-10-28: qty 5

## 2017-10-28 MED ORDER — PANTOPRAZOLE SODIUM 40 MG PO TBEC
40.0000 mg | DELAYED_RELEASE_TABLET | Freq: Every day | ORAL | Status: DC
  Administered 2017-10-28: 40 mg via ORAL
  Filled 2017-10-28: qty 1

## 2017-10-28 MED ORDER — HEPARIN BOLUS VIA INFUSION
4000.0000 [IU] | Freq: Once | INTRAVENOUS | Status: AC
  Administered 2017-10-28: 4000 [IU] via INTRAVENOUS
  Filled 2017-10-28: qty 4000

## 2017-10-28 MED ORDER — ACETAMINOPHEN 325 MG PO TABS
650.0000 mg | ORAL_TABLET | Freq: Four times a day (QID) | ORAL | Status: DC | PRN
  Administered 2017-10-30: 650 mg via ORAL
  Filled 2017-10-28: qty 2

## 2017-10-28 MED ORDER — SODIUM CHLORIDE 0.9% FLUSH: 3.0000 mL | INTRAVENOUS | Status: DC | PRN

## 2017-10-28 MED ORDER — HEPARIN (PORCINE) IN NACL 100-0.45 UNIT/ML-% IJ SOLN
900.0000 [IU]/h | INTRAMUSCULAR | Status: DC
  Administered 2017-10-28: 900 [IU]/h via INTRAVENOUS
  Filled 2017-10-28: qty 250

## 2017-10-28 MED ORDER — ATORVASTATIN CALCIUM 40 MG PO TABS
80.0000 mg | ORAL_TABLET | Freq: Every day | ORAL | Status: DC
  Administered 2017-10-28 – 2017-11-03 (×7): 80 mg via ORAL
  Filled 2017-10-28: qty 2
  Filled 2017-10-28 (×2): qty 1
  Filled 2017-10-28: qty 2
  Filled 2017-10-28 (×3): qty 1
  Filled 2017-10-28 (×2): qty 2
  Filled 2017-10-28: qty 1
  Filled 2017-10-28 (×3): qty 2

## 2017-10-28 MED ORDER — FLUTICASONE PROPIONATE 50 MCG/ACT NA SUSP
1.0000 | Freq: Every day | NASAL | Status: DC
  Administered 2017-10-28 – 2017-11-01 (×5): 1 via NASAL
  Filled 2017-10-28: qty 16

## 2017-10-28 MED ORDER — SODIUM CHLORIDE 0.9% FLUSH
3.0000 mL | Freq: Two times a day (BID) | INTRAVENOUS | Status: DC
  Administered 2017-10-29 – 2017-10-31 (×4): 3 mL via INTRAVENOUS

## 2017-10-28 MED ORDER — ASPIRIN 81 MG PO CHEW
324.0000 mg | CHEWABLE_TABLET | Freq: Once | ORAL | Status: AC
  Administered 2017-10-28: 324 mg via ORAL

## 2017-10-28 MED ORDER — IOPAMIDOL (ISOVUE-300) INJECTION 61%
INTRAVENOUS | Status: AC
  Filled 2017-10-28: qty 100

## 2017-10-28 MED ORDER — POTASSIUM CHLORIDE 20 MEQ/15ML (10%) PO SOLN
40.0000 meq | Freq: Once | ORAL | Status: DC
Start: 2017-10-28 — End: 2017-11-03
  Filled 2017-10-28: qty 30

## 2017-10-28 MED ORDER — DM-GUAIFENESIN ER 30-600 MG PO TB12
1.0000 | ORAL_TABLET | Freq: Two times a day (BID) | ORAL | Status: DC
  Administered 2017-10-28 – 2017-11-03 (×13): 1 via ORAL
  Filled 2017-10-28 (×13): qty 1

## 2017-10-28 MED ORDER — METHYLPREDNISOLONE SODIUM SUCC 125 MG IJ SOLR
125.0000 mg | Freq: Once | INTRAMUSCULAR | Status: AC
  Administered 2017-10-28: 125 mg via INTRAVENOUS
  Filled 2017-10-28: qty 2

## 2017-10-28 MED ORDER — METOPROLOL TARTRATE 25 MG PO TABS
25.0000 mg | ORAL_TABLET | Freq: Two times a day (BID) | ORAL | Status: DC
  Administered 2017-10-28 – 2017-10-31 (×8): 25 mg via ORAL
  Filled 2017-10-28 (×9): qty 1

## 2017-10-28 MED ORDER — HYDRALAZINE HCL 20 MG/ML IJ SOLN: 5.0000 mg | INTRAMUSCULAR | Status: DC | PRN

## 2017-10-28 MED ORDER — ASPIRIN 81 MG PO CHEW
324.0000 mg | CHEWABLE_TABLET | Freq: Every day | ORAL | Status: DC
Start: 2017-10-28 — End: 2017-10-28
  Administered 2017-10-28: 324 mg via ORAL
  Filled 2017-10-28: qty 4

## 2017-10-28 MED ORDER — NITROGLYCERIN 0.4 MG SL SUBL: 0.4000 mg | SUBLINGUAL_TABLET | SUBLINGUAL | Status: DC | PRN

## 2017-10-28 MED ORDER — ASPIRIN 81 MG PO CHEW
81.0000 mg | CHEWABLE_TABLET | Freq: Every day | ORAL | Status: DC
  Administered 2017-10-29 – 2017-11-03 (×6): 81 mg via ORAL
  Filled 2017-10-28 (×6): qty 1

## 2017-10-28 MED ORDER — VENLAFAXINE HCL ER 75 MG PO CP24
75.0000 mg | ORAL_CAPSULE | Freq: Every day | ORAL | Status: DC
  Administered 2017-10-28 – 2017-11-03 (×7): 75 mg via ORAL
  Filled 2017-10-28 (×7): qty 1

## 2017-10-28 MED ORDER — PREDNISONE 50 MG PO TABS
50.0000 mg | ORAL_TABLET | Freq: Every day | ORAL | Status: DC
  Administered 2017-10-29 – 2017-10-30 (×2): 50 mg via ORAL
  Filled 2017-10-28 (×2): qty 1

## 2017-10-28 MED ORDER — FLUCONAZOLE 100 MG PO TABS
100.0000 mg | ORAL_TABLET | Freq: Every day | ORAL | Status: DC
  Administered 2017-10-28 – 2017-11-02 (×6): 100 mg via ORAL
  Filled 2017-10-28 (×6): qty 1

## 2017-10-28 MED ORDER — ALBUTEROL SULFATE (2.5 MG/3ML) 0.083% IN NEBU
2.5000 mg | INHALATION_SOLUTION | RESPIRATORY_TRACT | Status: DC | PRN
  Administered 2017-10-28 – 2017-11-02 (×3): 2.5 mg via RESPIRATORY_TRACT
  Filled 2017-10-28 (×3): qty 3

## 2017-10-28 MED ORDER — MORPHINE SULFATE (PF) 2 MG/ML IV SOLN: 2.0000 mg | INTRAVENOUS | Status: DC | PRN

## 2017-10-28 MED ORDER — FUROSEMIDE 20 MG PO TABS
20.0000 mg | ORAL_TABLET | Freq: Every day | ORAL | Status: DC
  Administered 2017-10-28 – 2017-11-03 (×7): 20 mg via ORAL
  Filled 2017-10-28 (×7): qty 1

## 2017-10-28 MED ORDER — PANTOPRAZOLE SODIUM 40 MG PO TBEC
40.0000 mg | DELAYED_RELEASE_TABLET | Freq: Two times a day (BID) | ORAL | Status: DC
  Administered 2017-10-28 – 2017-11-03 (×12): 40 mg via ORAL
  Filled 2017-10-28 (×12): qty 1

## 2017-10-28 MED ORDER — ONDANSETRON HCL 4 MG/2ML IJ SOLN
4.0000 mg | Freq: Three times a day (TID) | INTRAMUSCULAR | Status: DC | PRN
Start: 2017-10-28 — End: 2017-11-03
  Filled 2017-10-28: qty 2

## 2017-10-28 MED ORDER — ALPRAZOLAM 0.25 MG PO TABS
0.2500 mg | ORAL_TABLET | Freq: Two times a day (BID) | ORAL | Status: DC | PRN
  Administered 2017-10-28 – 2017-11-03 (×8): 0.25 mg via ORAL
  Filled 2017-10-28 (×8): qty 1

## 2017-10-28 MED ORDER — ZOLPIDEM TARTRATE 5 MG PO TABS: 5.0000 mg | ORAL_TABLET | Freq: Every evening | ORAL | Status: DC | PRN

## 2017-10-28 MED ORDER — ASPIRIN 81 MG PO CHEW
81.0000 mg | CHEWABLE_TABLET | ORAL | Status: AC
  Administered 2017-10-29: 81 mg via ORAL
  Filled 2017-10-28: qty 1

## 2017-10-28 MED ORDER — DIPHENHYDRAMINE HCL 25 MG PO CAPS: 25.0000 mg | ORAL_CAPSULE | Freq: Three times a day (TID) | ORAL | Status: DC | PRN

## 2017-10-28 MED ORDER — LEVOTHYROXINE SODIUM 50 MCG PO TABS
50.0000 ug | ORAL_TABLET | Freq: Every day | ORAL | Status: DC
  Administered 2017-10-28 – 2017-11-03 (×7): 50 ug via ORAL
  Filled 2017-10-28 (×7): qty 1

## 2017-10-28 MED ORDER — SODIUM CHLORIDE 0.9 % IV SOLN: 250.0000 mL | INTRAVENOUS | Status: DC | PRN

## 2017-10-28 MED ORDER — IOPAMIDOL (ISOVUE-300) INJECTION 61%
100.0000 mL | Freq: Once | INTRAVENOUS | Status: AC | PRN
  Administered 2017-10-28: 100 mL via INTRAVENOUS

## 2017-10-28 MED ORDER — IPRATROPIUM-ALBUTEROL 0.5-2.5 (3) MG/3ML IN SOLN
3.0000 mL | Freq: Once | RESPIRATORY_TRACT | Status: AC
Start: 2017-10-28 — End: 2017-10-28
  Administered 2017-10-28: 3 mL via RESPIRATORY_TRACT
  Filled 2017-10-28: qty 3

## 2017-10-28 MED ORDER — POTASSIUM CHLORIDE CRYS ER 20 MEQ PO TBCR
40.0000 meq | EXTENDED_RELEASE_TABLET | Freq: Once | ORAL | Status: AC
  Administered 2017-10-28: 40 meq via ORAL
  Filled 2017-10-28: qty 2

## 2017-10-28 MED ORDER — LORATADINE 10 MG PO TABS
10.0000 mg | ORAL_TABLET | Freq: Every day | ORAL | Status: DC
  Administered 2017-10-28 – 2017-11-03 (×7): 10 mg via ORAL
  Filled 2017-10-28 (×7): qty 1

## 2017-10-28 MED ORDER — MORPHINE SULFATE (PF) 4 MG/ML IV SOLN
2.0000 mg | INTRAVENOUS | Status: DC | PRN
  Administered 2017-10-28: 2 mg via INTRAVENOUS
  Filled 2017-10-28: qty 1

## 2017-10-28 MED ORDER — BENZONATATE 100 MG PO CAPS
100.0000 mg | ORAL_CAPSULE | Freq: Three times a day (TID) | ORAL | Status: DC | PRN
  Administered 2017-10-28: 100 mg via ORAL
  Filled 2017-10-28: qty 1

## 2017-10-28 MED ORDER — ASPIRIN 81 MG PO CHEW
CHEWABLE_TABLET | ORAL | Status: AC
  Filled 2017-10-28: qty 4

## 2017-10-28 MED ORDER — NYSTATIN 100000 UNIT/GM EX POWD
Freq: Three times a day (TID) | CUTANEOUS | Status: DC
  Administered 2017-10-28 – 2017-11-03 (×18): via TOPICAL
  Filled 2017-10-28: qty 15

## 2017-10-28 MED ORDER — HEPARIN (PORCINE) IN NACL 100-0.45 UNIT/ML-% IJ SOLN
1200.0000 [IU]/h | INTRAMUSCULAR | Status: DC
  Administered 2017-10-28: 1200 [IU]/h via INTRAVENOUS
  Filled 2017-10-28: qty 250

## 2017-10-28 MED ORDER — SODIUM CHLORIDE 0.9 % IV SOLN
INTRAVENOUS | Status: DC
  Administered 2017-10-29: 06:00:00 via INTRAVENOUS

## 2017-10-28 MED ORDER — IOPAMIDOL (ISOVUE-300) INJECTION 61%
INTRAVENOUS | Status: AC
  Filled 2017-10-28: qty 30

## 2017-10-28 NOTE — Progress Notes (Signed)
Patient seen and examined.  Admitted after midnight secondary to intermittent chest discomfort and worsening shortness of breath.  Patient recently treated with steroids and antibiotics for bronchitis and COPD exacerbation.  Currently hemodynamically stable even is still complaining of shortness of breath.  Found to have elevated troponin without acute ischemic changes on her EKG.  Cardiology consulted.  Please refer to H&P written by Dr. Blaine Hamper for further info/details on her admission.  Barton Dubois MD [phone]

## 2017-10-28 NOTE — Progress Notes (Signed)
Pharmacy - IV heparin  Assessment:    Please see note from Doreene Eland, PharmD earlier today for full details.  Briefly, 71 y.o. female on IV heparin for r/o ACS.    First heparin level SUBtherapeutic at 0.14 on 900 units/hr  No line or bleeding issues per RN  Plan:   Heparin 4000 units IV bolus x 1  Increase heparin IV infusion to 1200 units/hr  Check heparin level 8 hrs after rate change  Daily CBC, daily heparin level once stable  Monitor for signs of bleeding or thrombosis   Reuel Boom, PharmD, BCPS Pager: [phone] 10/28/2017, 4:07 PM

## 2017-10-28 NOTE — H&P (Addendum)
History and Physical    Erica Morris TGG:[id number] DOB: 09/02/1946 DOA: 10/28/2017  Referring MD/NP/PA:   PCP: Zenia Resides, MD   Patient coming from:  The patient is coming from home.  At baseline, pt is independent for most of ADL.  Chief Complaint: Cough, shortness of breath, chest pain, abdominal pain, rashes  HPI: Erica Morris is a 71 y.o. female with medical history significant of hypertension, hyperlipidemia, stroke, GERD, hypothyroidism, dCHF, who presents with cough, shortness breath, chest pain, abdominal pain, rashes.  Patient states that she has been having dry cough and shortness of breath for almost 3 weeks, which has worsened in the past several days. Patient also has pleuritic chest pain. The chest pain is located in the left side of chest, constant, sharp, 10 out of 10 in severity, aggravated by coughing. Patient has chills, but no fever. Patient states that she has nausea, but no vomiting or diarrhea. She has abdominal pain, which is located in the central abdomen, constant, 10 out of 10 in severity, sharp. Her abdominal pain has been going on for about 1 week. Patient states she has mild dysuria, but no burning on urination or increased urinary frequency. Patient also has rashes in right leg and neck area, which has been going on for almost a month. Pt was seen by PCP on 11/27 for cough and SOB, was started with azithromycin and prednisone for possible bronchitis, without improvement.  ED Course: pt was found to have troponin 1.75, WBC 16.9, potassium 3.2, creatinine normal, pending urinalysis, tachycardia, also C-section 95% on room air, temperature normal. Chest x-ray negative for acute abnormalities. Patient is admitted to telemetry bed as inpatient. Card, Dr. Plainview Lions was consulted by EDP. Pt is family medicine teaching service patient, but [place] is full, no any bed available.  Review of Systems:   General: no fevers, has chills, no body weight  gain, has poor appetite, has fatigue HEENT: no blurry vision, hearing changes or sore throat Respiratory: has dyspnea, coughing, no wheezing CV: has chest pain, no palpitations GI: no nausea,  abdominal pain, no diarrhea, constipation, vomiting, GU: has dysuria, no burning on urination, increased urinary frequency, hematuria  Ext: no leg edema Neuro: no unilateral weakness, numbness, or tingling, no vision change or hearing loss Skin: has rash MSK: No muscle spasm, no deformity, no limitation of range of movement in spin Heme: No easy bruising.  Travel history: No recent long distant travel.  Allergy:  Allergies  Allergen Reactions  . Lisinopril Cough  . Codeine Phosphate Itching  . Morphine And Related Itching    Burning sensation and itchiness with IV morphine    Past Medical History:  Diagnosis Date  . ABDOMINAL PAIN, RECURRENT 03/29/2008  . ANXIETY 10/07/2007  . Candidiasis of mouth 05/30/2010  . GERD 07/25/2007  . Hernia, hiatal   . HYPERTENSION 07/25/2007  . Stroke [facility])     Past Surgical History:  Procedure Laterality Date  . ABDOMINAL HYSTERECTOMY    . BREAST SURGERY     bx  . KNEE ARTHROSCOPY      Social History:  reports that  has never smoked. she has never used smokeless tobacco. She reports that she drinks alcohol. She reports that she does not use drugs.  Family History:  Family History  Problem Relation Age of Onset  . Dementia Mother   . COPD Father   . Diabetes Sister   . Hypertension Sister   . Cancer Brother  Lung CA  . Clotting disorder Brother   . Kidney disease Neg Hx   . Stroke Neg Hx      Prior to Admission medications   Medication Sig Start Date End Date Taking? Authorizing Provider  albuterol (PROVENTIL HFA;VENTOLIN HFA) 108 (90 Base) MCG/ACT inhaler Inhale 2 puffs into the lungs every 4 (four) hours as needed for wheezing or shortness of breath. 10/26/17  Yes Mercy Riding, MD  aspirin EC 81 MG tablet Take 81 mg by mouth every  morning.    Yes [provider]  atorvastatin (LIPITOR) 40 MG tablet Take 1 tablet (40 mg total) by mouth daily. 01/11/17  Yes Dickie La, MD  azithromycin (ZITHROMAX) 250 MG tablet Take 2 tabs by mouth on day 1, then 1 tab by mouth daily 10/26/17 11/01/17 Yes Gonfa, Charlesetta Ivory, MD  furosemide (LASIX) 20 MG tablet Take 1 tablet (20 mg total) by mouth daily. 03/10/17  Yes Hensel, Jamal Collin, MD  levothyroxine (SYNTHROID, LEVOTHROID) 50 MCG tablet Take 1 tablet (50 mcg total) daily before breakfast by mouth. 10/11/17  Yes Enid Derry, [place], DO  metoprolol tartrate (LOPRESSOR) 25 MG tablet Take 1 tablet (25 mg total) by mouth 2 (two) times daily. 09/10/17  Yes [place] Bing, DO  pantoprazole (PROTONIX) 40 MG tablet TAKE 1 TABLET(40 MG) BY MOUTH DAILY 07/19/17  Yes Hensel, Jamal Collin, MD  predniSONE (DELTASONE) 50 MG tablet Take 1 tablet (50 mg total) by mouth daily with breakfast. 10/26/17  Yes Mercy Riding, MD  venlafaxine XR (EFFEXOR XR) 75 MG 24 hr capsule Take 1 capsule (75 mg total) daily with breakfast by mouth. 10/18/17  Yes Caroline More, DO  Spacer/Aero-Holding Chambers (AEROCHAMBER PLUS) inhaler Use as instructed 10/26/17   Mercy Riding, MD    Physical Exam: Vitals:   10/28/17 0430 10/28/17 0500 10/28/17 0502 10/28/17 0600  BP: (!) 152/100   125/60  Pulse: 97 87  81  Resp: 19   20  Temp:      TempSrc:      SpO2: 95% 95%  95%  Weight:   90.3 kg (199 lb)   Height:   5\' 4"  (1.626 m)    General: Not in acute distress HEENT:       Eyes: PERRL, EOMI, no scleral icterus.       ENT: No discharge from the ears and nose, no pharynx injection, no tonsillar enlargement.        Neck: No JVD, no bruit, no mass felt. Heme: No neck lymph node enlargement. Cardiac: S1/S2, RRR, No murmurs, No gallops or rubs. Respiratory: No rales, wheezing, rhonchi or rubs. GI: Soft, nondistended, has diffused tenderness, no rebound pain, no organomegaly, BS present. GU: No hematuria Ext: No pitting  leg edema bilaterally. 2+DP/PT pulse bilaterally. Musculoskeletal: No joint deformities, No joint redness or warmth, no limitation of ROM in spin. Skin: has rashes in right leg and neck area. Neuro: Alert, oriented X3, cranial nerves II-XII grossly intact, moves all extremities normally.  Psych: Patient is not psychotic, no suicidal or hemocidal ideation.  Labs on Admission: I have personally reviewed following labs and imaging studies  CBC: Recent Labs  Lab 10/28/17 0425  WBC 16.9*  NEUTROABS 14.1*  HGB 12.0  HCT 36.4  MCV 80.5  PLT 258   Basic Metabolic Panel: Recent Labs  Lab 10/28/17 0425  NA 139  K 3.2*  CL 105  CO2 24  GLUCOSE 127*  BUN 10  CREATININE 0.87  CALCIUM 8.8*  GFR: Estimated Creatinine Clearance: 65.4 mL/min (by C-G formula based on SCr of 0.87 mg/dL). Liver Function Tests: No results for input(s): AST, ALT, ALKPHOS, BILITOT, PROT, ALBUMIN in the last 168 hours. No results for input(s): LIPASE, AMYLASE in the last 168 hours. No results for input(s): AMMONIA in the last 168 hours. Coagulation Profile: No results for input(s): INR, PROTIME in the last 168 hours. Cardiac Enzymes: No results for input(s): CKTOTAL, CKMB, CKMBINDEX, TROPONINI in the last 168 hours. BNP (last 3 results) No results for input(s): PROBNP in the last 8760 hours. HbA1C: No results for input(s): HGBA1C in the last 72 hours. CBG: No results for input(s): GLUCAP in the last 168 hours. Lipid Profile: No results for input(s): CHOL, HDL, LDLCALC, TRIG, CHOLHDL, LDLDIRECT in the last 72 hours. Thyroid Function Tests: No results for input(s): TSH, T4TOTAL, FREET4, T3FREE, THYROIDAB in the last 72 hours. Anemia Panel: No results for input(s): VITAMINB12, FOLATE, FERRITIN, TIBC, IRON, RETICCTPCT in the last 72 hours. Urine analysis:    Component Value Date/Time   COLORURINE YELLOW 08/08/2017 1728   APPEARANCEUR CLEAR 08/08/2017 1728   LABSPEC 1.016 08/08/2017 1728   PHURINE 6.0  08/08/2017 1728   GLUCOSEU NEGATIVE 08/08/2017 1728   HGBUR NEGATIVE 08/08/2017 1728   BILIRUBINUR NEGATIVE 08/08/2017 1728   BILIRUBINUR NEG 12/03/2016 1134   KETONESUR NEGATIVE 08/08/2017 1728   PROTEINUR NEGATIVE 08/08/2017 1728   UROBILINOGEN 0.2 12/03/2016 1134   UROBILINOGEN 0.2 06/19/2013 1351   NITRITE NEGATIVE 08/08/2017 1728   LEUKOCYTESUR NEGATIVE 08/08/2017 1728   Sepsis Labs: @LABRCNTIP (procalcitonin:4,lacticidven:4) )No results found for this or any previous visit (from the past 240 hour(s)).   Radiological Exams on Admission: Dg Chest 2 View  Result Date: 10/28/2017 CLINICAL DATA:  Productive cough. Chest pain and shortness of breath. EXAM: CHEST  2 VIEW COMPARISON:  Radiograph 10/03/2017 FINDINGS: The cardiomediastinal contours are normal. Aortic arch atherosclerosis. Pulmonary vasculature is normal. No consolidation, pleural effusion, or pneumothorax. No acute osseous abnormalities are seen. IMPRESSION: No acute abnormality or change from prior exam. Electronically Signed   By: Jeb Levering M.D.   On: 10/28/2017 03:28     EKG: Independently reviewed.  Sinus rhythm, QTC 399, early R-wave progression, diffuse, mild ST depression.   Assessment/Plan Principal Problem:   Chest pain Active Problems:   Essential hypertension   GERD   Abdominal pain   Pure hypercholesterolemia   History of CVA (cerebrovascular accident)   Hypothyroidism   Diastolic CHF (HCC)   Cough   NSTEMI (non-ST elevated myocardial infarction) (HCC)   Rash   Chest pain: pt has positive trop 1.75, indicating possible NSTEMI, but her chest pain is pleuritic, patient has upper respiratory infection symptoms for several weeks, making "myopericarditis" a potential differential diagnosis.  - will admit to tele bed as inpt - start IV heparin - cycle CE q6 x3 and repeat EKG in the am  - prn Nitroglycerin, Morphine, and aspirin, lipitor  - Risk factor stratification: will check FLP, UDS and  A1C  - 2d echo - Inpatient non-urgent consult order was put in Epic and message to Birdie Sons was sent out.  Cough and SOB: Chest x-rays negative. Possibly due to bronchitis. -Switch azithromycin to doxycycline (pt has rashes, may have better coverage) -When necessary Mucinex for cough -When necessary albuterol for SOB -get flu PCR and respiratory virus panel  HTN:  -Continue home medications: Metoprolol and Lasix -IV hydralazine prn  GERD: -Protonix  Hx of stroke: -on ASA and lipitor  HLD -lipitor  Hypothyroidism: Last TSH was 5.070 -Continue home Synthroid  Chronic Diastolic CHF: 2-D echo on 11/08/16 showed EF 60-65%. Patient does not have leg edema or JVD. CHF is compensated. -Continue aspirin, metoprolol and home dose of Lasix, 20 mg daily  Abdominal pain: Patient has a diffuse tenderness on physical examination. Etiology is not clear. -Check lipase -Follow-up CT abdomen/pelvis -When necessary Zofran for nausea -get LFT  Rash: Etiology is not clear. -When necessary Benadryl for itching  Hypokalemia: K= 3.2 on admission. - Repleted - Check Mg level  Dysuria:  -f/u UA and Ux    DVT ppx: on IV Heparin    Code Status: Full code Family Communication:  Yes, patient's son and daughter-in law    at bed side Disposition Plan:  Anticipate discharge back to previous home environment Consults called: card Admission status:   Inpatient/tele      Date of Service 10/28/2017    [facility] Pager [phone]  If 7PM-7AM, please contact night-coverage [email or web address] Password [facility] 10/28/2017, 6:58 AM

## 2017-10-28 NOTE — Progress Notes (Signed)
  Echocardiogram 2D Echocardiogram has been performed.  Erica Morris M 10/28/2017, 11:28 AM

## 2017-10-28 NOTE — Progress Notes (Signed)
ANTICOAGULATION CONSULT NOTE - Initial Consult  Pharmacy Consult for heparin Indication: chest pain/ACS  Allergies  Allergen Reactions  . Lisinopril Cough  . Codeine Phosphate Itching  . Morphine And Related Itching    Burning sensation and itchiness with IV morphine    Patient Measurements: Height: 5\' 4"  (162.6 cm) Weight: 199 lb (90.3 kg) IBW/kg (Calculated) : 54.7 Heparin Dosing Weight: 75kg  Vital Signs: Temp: 98.6 F (37 C) (11/29 0312) Temp Source: Oral (11/29 0312) BP: 125/60 (11/29 0600) Pulse Rate: 81 (11/29 0600)  Labs: Recent Labs    10/28/17 0425  HGB 12.0  HCT 36.4  PLT 339  CREATININE 0.87    Estimated Creatinine Clearance: 65.4 mL/min (by C-G formula based on SCr of 0.87 mg/dL).   Medical History: Past Medical History:  Diagnosis Date  . ABDOMINAL PAIN, RECURRENT 03/29/2008  . ANXIETY 10/07/2007  . Candidiasis of mouth 05/30/2010  . GERD 07/25/2007  . Hernia, hiatal   . HYPERTENSION 07/25/2007  . Stroke [facility])     Assessment: 46 YOF presents with shortness of breath.  Recent diagnosis of bronchitis.  In ED, troponin noted to be elevated w/ ST segment depression (per EDP note).  Pharmacy asked to dose heparin gtt for rule out ACS.  Today, 10/28/2017  No documented anticoagulant use prior to admission  CBC: Hgb and pltc WNL at admission  Renal: SCr WNL  Goal of Therapy:  Heparin level 0.3-0.7 units/ml Monitor platelets by anticoagulation protocol: Yes   Plan:    Heparin 4000 units then heparin 900 units/hr  Check heparin level in 8h  Daily heparin level and CBC  Monitor for bleeding  Doreene Eland, PharmD, BCPS.   Pager: [phone] 10/28/2017 6:48 AM

## 2017-10-28 NOTE — ED Notes (Signed)
Hospitalist at bedside 

## 2017-10-28 NOTE — Consult Note (Signed)
Cardiology Consultation:   Patient ID: [name]; [id number]; 12/03/1945   Admit date: 10/28/2017 Date of Consult: 10/28/2017  Primary Care Provider: Zenia Resides, MD Primary Cardiologist: New- Dr Erica Morris   Patient Profile:   Erica Morris is a 71 y.o. female with a hx of hypertension, hyperlipidemia, stroke, GERD, hypothyroidism, diastolic CHF who is being seen today for the evaluation of chest pain and elevated troponin at the request of Erica Morris presented to the resident emergency department with complaints of cough, shortness of breath, chest pain, abdominal pain and rash.  History of Present Illness:   Erica Morris presented to the resident emergency department with complaints of dry cough, shortness of breath, chest pain, abdominal pain and rash.  She has had a dry cough and shortness of breath for almost 3 weeks, which worsened in the past several days.  She also has pleuritic chest pain located in the left chest that is constant, sharp and aggravated by coughing.  She reported chills but no fever.  She has had nausea but no vomiting or diarrhea.  She also has abdominal pain in the central abdomen that is constant and sharp.  She was found to have an elevated troponin and leukocytosis with WBC 16.9.  Chest x-ray did not show acute abnormalities.  She has been started on IV heparin and cardiac enzymes are being cycled.   doxycycline has been initiated for possible bronchitis.  The patient is a little bit difficult to pin down on her symptoms.  She states that she has been having "panic attacks" for a couple of months and at least daily.  During these attacks she becomes short of breath and has some chest discomfort.  This is not related to activity and occurs especially when she is upset.  She was given "nerve pills" a little less than 2 weeks ago and states that they have not helped his panic attacks as of yet.  Currently she is having coughing, dyspnea and cannot eat or sleep.   She takes Lasix daily for peripheral edema and "fluid on the lungs" that was treated at her PCP office.  Currently she has diffuse chest wall tenderness anteriorly and posteriorly that is dull and then sharp with coughing, and reproducible with palpation.  Yesterday she did have an episode of left-sided sharp chest pain that lasted for about 1 minute with radiation to the right arm and accompanying shortness of breath.  In discussing her health prior to this episode of bronchitis she does state that she was having dyspnea on exertion with chest pressure and sweating when she walks.  This occurs when she walks to the store about 3 blocks away and she has to stop several times to recover.  It is unclear if she has orthopnea as she states that when she lays down at night her coughing increases and she has to sit up for the coughing.  She denies any previous MI or cardiac diagnostics.  She has no cardiac family history and her pain.  She thinks that her sister may have had some type of cardiac event that she was vague.  She says that she has never smoked, however her son and daughter both smoke in the home with her.  She denies any alcohol use.  Significant findings Troponins 1.75, 1.64  BNP 271.5   (BNP was 86.5 on 10/03/17) CK-MB 22.3 319   CK total  EKG sinus tachycardia at 100 bpm with diffuse ST depression  Past Medical History:  Diagnosis Date  . ABDOMINAL PAIN, RECURRENT 03/29/2008  . ANXIETY 10/07/2007  . Candidiasis of mouth 05/30/2010  . GERD 07/25/2007  . Hernia, hiatal   . HYPERTENSION 07/25/2007  . Stroke Erica Morris)     Past Surgical History:  Procedure Laterality Date  . ABDOMINAL HYSTERECTOMY    . BREAST SURGERY     bx  . KNEE ARTHROSCOPY       Home Medications:  Prior to Admission medications   Medication Sig Start Date End Date Taking? Authorizing Provider  albuterol (PROVENTIL HFA;VENTOLIN HFA) 108 (90 Base) MCG/ACT inhaler Inhale 2 puffs into the lungs every 4 (four) hours as  needed for wheezing or shortness of breath. 10/26/17  Yes Erica Riding, MD  aspirin EC 81 MG tablet Take 81 mg by mouth every morning.    Yes [provider]  atorvastatin (LIPITOR) 40 MG tablet Take 1 tablet (40 mg total) by mouth daily. 01/11/17  Yes Erica La, MD  azithromycin (ZITHROMAX) 250 MG tablet Take 2 tabs by mouth on day 1, then 1 tab by mouth daily 10/26/17 11/01/17 Yes Morris, Erica Ivory, MD  furosemide (LASIX) 20 MG tablet Take 1 tablet (20 mg total) by mouth daily. 03/10/17  Yes Morris, Erica Collin, MD  levothyroxine (SYNTHROID, LEVOTHROID) 50 MCG tablet Take 1 tablet (50 mcg total) daily before breakfast by mouth. 10/11/17  Yes Erica Morris, [place], Morris  metoprolol tartrate (LOPRESSOR) 25 MG tablet Take 1 tablet (25 mg total) by mouth 2 (two) times daily. 09/10/17  Yes Erica Bing, Morris  pantoprazole (PROTONIX) 40 MG tablet TAKE 1 TABLET(40 MG) BY MOUTH DAILY 07/19/17  Yes Morris, Erica Collin, MD  predniSONE (DELTASONE) 50 MG tablet Take 1 tablet (50 mg total) by mouth daily with breakfast. 10/26/17  Yes Erica Riding, MD  venlafaxine XR (EFFEXOR XR) 75 MG 24 hr capsule Take 1 capsule (75 mg total) daily with breakfast by mouth. 10/18/17  Yes Erica More, Morris  Spacer/Aero-Holding Chambers (AEROCHAMBER PLUS) inhaler Use as instructed 10/26/17   Erica Riding, MD    Inpatient Medications: Scheduled Meds: . aspirin  324 mg Oral Daily  . atorvastatin  80 mg Oral Daily  . dextromethorphan-guaiFENesin  1 tablet Oral BID  . doxycycline  100 mg Oral Q12H  . furosemide  20 mg Oral Daily  . iopamidol      . iopamidol      . levothyroxine  50 mcg Oral QAC breakfast  . metoprolol tartrate  25 mg Oral BID  . pantoprazole  40 mg Oral Daily  . potassium chloride  40 mEq Oral Once  . venlafaxine XR  75 mg Oral Q breakfast   Continuous Infusions: . heparin 900 Units/hr (10/28/17 0716)   PRN Meds: acetaminophen, albuterol, ALPRAZolam, diphenhydrAMINE, hydrALAZINE, iopamidol,  iopamidol, morphine injection, nitroGLYCERIN, ondansetron, zolpidem  Allergies:    Allergies  Allergen Reactions  . Lisinopril Cough  . Codeine Phosphate Itching  . Morphine And Related Itching    Burning sensation and itchiness with IV morphine    Social History:   Social History   Socioeconomic History  . Marital status: Widowed    Spouse name: Not on file  . Number of children: Not on file  . Years of education: Not on file  . Highest education level: Not on file  Social Needs  . Financial resource strain: Not on file  . Food insecurity - worry: Not on file  . Food insecurity - inability: Not on file  .  Transportation needs - medical: Not on file  . Transportation needs - non-medical: Not on file  Occupational History  . Not on file  Tobacco Use  . Smoking status: Never Smoker  . Smokeless tobacco: Never Used  Substance and Sexual Activity  . Alcohol use: Yes    Comment: occasional  . Drug use: No  . Sexual activity: No    Birth control/protection: None  Other Topics Concern  . Not on file  Social History Narrative   Current Social History 08/18/2017            Patient lives with daughter and 64 yo granddaughter in one level home 08/18/2017   Transportation: Patient relies on [employer] 08/18/2017   Important Relationships: "Nobody" 08/18/2017    Pets: Dog (Pebbles) cat Willodean Rosenthal) and two hamsters 08/18/2017   Education / Work:  8th grade/ Works around house 08/18/2017   Interests / Fun: TV and crosswords 08/18/2017   Current Stressors: Living situation and food concerns 08/18/2017   L. Ducatte, RN, BSN                                                                                                  Family History:    Family History  Problem Relation Age of Onset  . Dementia Mother   . Alcoholism Mother   . COPD Father   . Diabetes Sister   . Hypertension Sister   . Cancer Brother        Lung CA  . COPD Brother   . Clotting disorder Brother   . Kidney disease Neg  Hx   . Stroke Neg Hx      ROS:  Please see the history of present illness.  ROS  All other ROS reviewed and negative.     Physical Exam/Data:   Vitals:   10/28/17 0500 10/28/17 0502 10/28/17 0600 10/28/17 0751  BP:   125/60 (!) 168/70  Pulse: 87  81 79  Resp:   20   Temp:    98.6 F (37 C)  TempSrc:    Oral  SpO2: 95%  95% 96%  Weight:  199 lb (90.3 kg)    Height:  5\' 4"  (1.626 m)     No intake or output data in the 24 hours ending 10/28/17 1002 Filed Weights   10/28/17 0502  Weight: 199 lb (90.3 kg)   Body mass index is 34.16 kg/m.  General:  Well nourished, well developed, with frequent nonproductive cough, in no acute distress HEENT: normal Lymph: no adenopathy Neck: no JVD Endocrine:  No thryomegaly Vascular: No carotid bruits; FA pulses 2+ bilaterally without bruits  Cardiac:  normal S1, S2; RRR; no murmur  Lungs:  clear to auscultation bilaterally, no wheezing, rhonchi or rales  Abd: soft, nontender, no hepatomegaly  Ext: no edema Musculoskeletal:  No deformities, BUE and BLE strength normal and equal, anterior and posterior chest wall tenderness to palpation Skin: warm and dry  Neuro:  CNs 2-12 intact, no focal abnormalities noted Psych:  Normal affect   EKG:  The EKG was personally reviewed and demonstrates:  NSR  with diffuse upsloing ST depression Telemetry:  Telemetry was personally reviewed and demonstrates:  Sinus rhythm in the 80's, 5 beats of SVT this am.   Relevant CV Studies:  Echocardiogram 11/08/2016 Mild LVH with normal systolic function, EF 80-03%, normal wall motion, abnormal diastolic function, indeterminate grad, mild MR, moderately dilated left atrium.  Laboratory Data:  Chemistry Recent Labs  Lab 10/28/17 0425  NA 139  K 3.2*  CL 105  CO2 24  GLUCOSE 127*  BUN 10  CREATININE 0.87  CALCIUM 8.8*  GFRNONAA >60  GFRAA >60  ANIONGAP 10    Recent Labs  Lab 10/28/17 0700  PROT 7.0  ALBUMIN 3.5  AST 35  ALT 20  ALKPHOS 111   BILITOT 1.0   Hematology Recent Labs  Lab 10/28/17 0425  WBC 16.9*  RBC 4.52  HGB 12.0  HCT 36.4  MCV 80.5  MCH 26.5  MCHC 33.0  RDW 13.8  PLT 339   Cardiac Enzymes Recent Labs  Lab 10/28/17 0700  TROPONINI 1.64*    Recent Labs  Lab 10/28/17 0443  TROPIPOC 1.75*    BNP Recent Labs  Lab 10/28/17 0425  BNP 271.5*    DDimer No results for input(s): DDIMER in the last 168 hours.  Radiology/Studies:  Dg Chest 2 View  Result Date: 10/28/2017 CLINICAL DATA:  Productive cough. Chest pain and shortness of breath. EXAM: CHEST  2 VIEW COMPARISON:  Radiograph 10/03/2017 FINDINGS: The cardiomediastinal contours are normal. Aortic arch atherosclerosis. Pulmonary vasculature is normal. No consolidation, pleural effusion, or pneumothorax. No acute osseous abnormalities are seen. IMPRESSION: No acute abnormality or change from prior exam. Electronically Signed   By: Jeb Levering M.D.   On: 10/28/2017 03:28    Assessment and Plan:   Chest pain with elevated troponin -In setting of probable acute bronchitis.  Chest pain is mostly pleuritic in nature exacerbated by coughing, but has had DOE, chest pressure and diaphoresis with exertion for a couple of months.  She has been treated for panic attacks which she describes as sudden shortness of breath not related to activity and occurring most often with anxiety. -Troponins elevated and flat pattern 1.75, 1.64 -EKG with diffuse mild upsloping ST depression, new since previous -CVD risk factors include hyperlipidemia with LDL 176, hypertension, exposure to secondhand smoke -IV heparin is infusing, she is on aspirin 81 mg daily -Continue to cycle troponins  -Troponin elevation may be related to her respiratory illness, however, with her recent exertional symptoms and EKG changes, I feel that she will need further ischemic testing.  Patient currently has frequent cough especially when she lays down and is not likely to Morris well with a  cardiac catheterization at this time. -Currently patient is stable.  Will discuss ischemic testing options with Dr. Meda Morris and likely cath tomorrow.   Chronic diastolic CHF -Echocardiogram on 11/08/16 showed EF 60-65%.  Patient appears euvolemic at present without peripheral edema or JVD.   -Repeat echocardiogram has been ordered -Home management includes metoprolol and Lasix  Acute bronchitis -Frequent nonproductive cough. -CXR with no acute abnormality -WBCs 16.9. Management per IM.   Leukocytosis -UA positive for large leukocytes, many bacteria and RBCs  Hypokalemia -Potassium 3.2 on admission, being repleted.  magnesium  1.8  Hypertension -Home medications include metoprolol tartrate 25 mg twice daily and Lasix 20 mg daily  Hyperlipidemia -LDL 176 in 10/2016, patient is on atorvastatin 80 mg daily -Updated lipid panel has been ordered  Abdominal pain  -management per internal medicine  Rash -Thought to be related to azithromycin which has been changed to doxycycline per internal medicine  For questions or updates, please contact [place] Please consult [email or web address] for contact info under Cardiology/STEMI.   Signed, Daune Perch, NP  10/28/2017 10:02 AM   The patient was seen, examined and discussed with Daune Perch, NP-C and I agree with the above.   71 y.o. female with a hx of hypertension, hyperlipidemia, stroke, GERD, hypothyroidism, diastolic CHF who is being seen today for the evaluation of chest pain and elevated troponin at the request of Erica Morris presented to the resident emergency department with complaints of cough, shortness of breath, chest pain, abdominal pain and rash.  She has been experiencing exertional dyspnea, progressively worsening now after few steps, associated with chest pain. Leukocytosis 16.5, abnormal U/A, She was diagnosed with UTI and is being treated with doxycyline. She has significant non-productive cough.  Troponin  elevated 1.64-> 1.49, started on heparin drip. Chest pain free right now, ECG significantly different from prior - previously NSR, now diffuse ST depressions.  Echo shows normal LVEF, no wall motion abnormalities. Mild MR, RVSP 34 mmHg.  On physical exam she is coughing, there are no JVDs, no crackles or wheezes, no murmur or LE edema.  She most probably has a bronchitis in addition to UTI, I will start respiratory treatments. We will plan for a cardiac cath once she is feeling better, less coughing, we will tentatively plan for tomorrow.   Ena Dawley, MD 10/28/2017

## 2017-10-28 NOTE — ED Provider Notes (Signed)
[facility] DEPT Provider Note   CSN: [id number] Arrival date & time: 10/28/17  0303     History   Chief Complaint Chief Complaint  Patient presents with  . Shortness of Breath    HPI Erica Morris is a 71 y.o. female.  Patient is a 71 year old female with past medical history of hypertension, anxiety, prior CVA presenting for evaluation of chest congestion and cough.  This is been ongoing for the past several days.  She reports "coughing up green stuff".  She was seen by her primary doctor and diagnosed with bronchitis 2 days ago.  She was started on a Z-Pak, prednisone, and has been doing DuoNeb's at home, however is not feeling any better.  She has been coughing all night and cannot sleep and presents for evaluation of this.   The history is provided by the patient.  Shortness of Breath  This is a new problem. The average episode lasts 2 days. The problem occurs continuously.The problem has been gradually worsening. Associated symptoms include cough, sputum production and chest pain. Pertinent negatives include no hemoptysis, no leg pain and no leg swelling. Treatments tried: DuoNeb, prednisone, Zithromax. The treatment provided no relief.    Past Medical History:  Diagnosis Date  . ABDOMINAL PAIN, RECURRENT 03/29/2008  . ANXIETY 10/07/2007  . Candidiasis of mouth 05/30/2010  . GERD 07/25/2007  . Hernia, hiatal   . HYPERTENSION 07/25/2007  . Stroke [facility])     Patient Active Problem List   Diagnosis Date Noted  . Dry mouth 10/07/2017  . Nasal congestion 09/16/2017  . Vertigo 08/10/2017  . Candidiasis, intertrigo 04/02/2017  . Diastolic CHF ([place]) 58/52/7782  . 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 Hypothyroidism 11/17/2016  . Episode of recurrent major depressive disorder ([place])   . History of CVA (cerebrovascular accident)   . Pure hypercholesterolemia 11/06/2016  . Chronic venous insufficiency 11/05/2016   . Peripheral neuropathy 11/05/2016  . Cerebrovascular disease 06/09/2016  . Orthostatic hypotension 06/09/2016  . Hypokalemia 02/20/2015  . History of colonic polyps 06/04/2014  . Depression 03/29/2014  . Chronic constipation 03/29/2014  . ABDOMINAL PAIN, RECURRENT 03/29/2008  . Anxiety state 10/07/2007  . Essential hypertension 07/25/2007  . GERD 07/25/2007    Past Surgical History:  Procedure Laterality Date  . ABDOMINAL HYSTERECTOMY    . BREAST SURGERY     bx  . KNEE ARTHROSCOPY      OB History    No data available       Home Medications    Prior to Admission medications   Medication Sig Start Date End Date Taking? Authorizing Provider  albuterol (PROVENTIL HFA;VENTOLIN HFA) 108 (90 Base) MCG/ACT inhaler Inhale 2 puffs into the lungs every 4 (four) hours as needed for wheezing or shortness of breath. 10/26/17   Mercy Riding, MD  aspirin EC 81 MG tablet Take 81 mg by mouth every morning.     [provider]  atorvastatin (LIPITOR) 40 MG tablet Take 1 tablet (40 mg total) by mouth daily. 01/11/17   Dickie La, MD  azithromycin (ZITHROMAX) 250 MG tablet Take 2 tabs by mouth on day 1, then 1 tab by mouth daily 10/26/17 11/01/17  Mercy Riding, MD  furosemide (LASIX) 20 MG tablet Take 1 tablet (20 mg total) by mouth daily. 03/10/17   Zenia Resides, MD  levothyroxine (SYNTHROID, LEVOTHROID) 50 MCG tablet Take 1 tablet (50 mcg total) daily before breakfast by mouth. 10/11/17  Enid Derry, [place], DO  metoprolol tartrate (LOPRESSOR) 25 MG tablet Take 1 tablet (25 mg total) by mouth 2 (two) times daily. 09/10/17   Kersey Bing, DO  pantoprazole (PROTONIX) 40 MG tablet TAKE 1 TABLET(40 MG) BY MOUTH DAILY 07/19/17   Zenia Resides, MD  predniSONE (DELTASONE) 50 MG tablet Take 1 tablet (50 mg total) by mouth daily with breakfast. 10/26/17   Mercy Riding, MD  Spacer/Aero-Holding Chambers (AEROCHAMBER PLUS) inhaler Use as instructed 10/26/17   Mercy Riding, MD    venlafaxine XR (EFFEXOR XR) 75 MG 24 hr capsule Take 1 capsule (75 mg total) daily with breakfast by mouth. 10/18/17   Caroline More, DO    Family History Family History  Problem Relation Age of Onset  . Dementia Mother   . COPD Father   . Diabetes Sister   . Hypertension Sister   . Cancer Brother        Lung CA  . Clotting disorder Brother   . Kidney disease Neg Hx   . Stroke Neg Hx     Social History Social History   Tobacco Use  . Smoking status: Never Smoker  . Smokeless tobacco: Never Used  Substance Use Topics  . Alcohol use: Yes    Comment: occasional  . Drug use: No     Allergies   Lisinopril; Codeine phosphate; and Morphine and related   Review of Systems Review of Systems  Respiratory: Positive for cough, sputum production and shortness of breath. Negative for hemoptysis.   Cardiovascular: Positive for chest pain. Negative for leg swelling.  All other systems reviewed and are negative.    Physical Exam Updated Vital Signs BP (!) 168/83 (BP Location: Right Arm)   Pulse (!) 101   Temp 98.6 F (37 C) (Oral)   Resp 20   SpO2 96%   Physical Exam  Constitutional: She is oriented to person, place, and time. She appears well-developed and well-nourished. No distress.  Patient having coughing fits throughout the exam that are nonproductive.  HENT:  Head: Normocephalic and atraumatic.  Neck: Normal range of motion. Neck supple.  Cardiovascular: Normal rate and regular rhythm. Exam reveals no gallop and no friction rub.  No murmur heard. Pulmonary/Chest: Effort normal and breath sounds normal. No respiratory distress. She has no wheezes.  Abdominal: Soft. Bowel sounds are normal. She exhibits no distension. There is no tenderness.  Musculoskeletal: Normal range of motion.       Right lower leg: Normal. She exhibits no tenderness and no edema.       Left lower leg: Normal. She exhibits no tenderness and no edema.  Neurological: She is alert and  oriented to person, place, and time.  Skin: Skin is warm and dry. She is not diaphoretic.  Nursing note and vitals reviewed.    ED Treatments / Results  Labs (all labs ordered are listed, but only abnormal results are displayed) Labs Reviewed  BASIC METABOLIC PANEL  CBC WITH DIFFERENTIAL/PLATELET  BRAIN NATRIURETIC PEPTIDE  I-STAT TROPONIN, ED    EKG  EKG Interpretation  Date/Time:  Thursday October 28 2017 03:10:07 EST Ventricular Rate:  100 PR Interval:    QRS Duration: 79 QT Interval:  309 QTC Calculation: 399 R Axis:   69 Text Interpretation:  Sinus tachycardia obal ischemia (LM/MVD) Confirmed by Veryl Speak [phone]) on 10/28/2017 3:17:40 AM       Radiology Dg Chest 2 View  Result Date: 10/28/2017 CLINICAL DATA:  Productive cough. Chest pain and shortness  of breath. EXAM: CHEST  2 VIEW COMPARISON:  Radiograph 10/03/2017 FINDINGS: The cardiomediastinal contours are normal. Aortic arch atherosclerosis. Pulmonary vasculature is normal. No consolidation, pleural effusion, or pneumothorax. No acute osseous abnormalities are seen. IMPRESSION: No acute abnormality or change from prior exam. Electronically Signed   By: Jeb Levering M.D.   On: 10/28/2017 03:28    Procedures Procedures (including critical care time)  Medications Ordered in ED Medications  methylPREDNISolone sodium succinate (SOLU-MEDROL) 125 mg/2 mL injection 125 mg (not administered)  ipratropium-albuterol (DUONEB) 0.5-2.5 (3) MG/3ML nebulizer solution 3 mL (not administered)     Initial Impression / Assessment and Plan / ED Course  I have reviewed the triage vital signs and the nursing notes.  Pertinent labs & imaging results that were available during my care of the patient were reviewed by me and considered in my medical decision making (see chart for details).  Patient presents with URI symptoms over the past week.  She is having persistent cough that is preventing her from sleeping.  Her  workup today reveals an elevated troponin of 1.75 along with ST depressions diffusely on her EKG.  I have discussed these findings with Dr. Regis Bill from cardiology who is concerned about the possibility of a myocarditis.  She will be admitted for further studies, likely repeat cardiac enzymes and echocardiogram.  Dr. Blaine Hamper agrees to admit.  Final Clinical Impressions(s) / ED Diagnoses   Final diagnoses:  None    ED Discharge Orders    None       Veryl Speak, MD 10/28/17 [phone]

## 2017-10-28 NOTE — ED Triage Notes (Addendum)
Patient is experiencing shortness of breath for the last few days but getting worse tonight. Also, has a productive cough but denies fever. Respirations are even, regular, and mild labor. Patient was seen on 11/27 by Dr. Bettina Gavia and dx with bronchitis. Patient is coughing and mildly anxious.

## 2017-10-28 NOTE — ED Notes (Addendum)
Notified EDP,Delo,MD. Pt. I-stat troponin results 1.75 and RN,Allan made aware.

## 2017-10-29 DIAGNOSIS — E7800 Pure hypercholesterolemia, unspecified: Secondary | ICD-10-CM

## 2017-10-29 DIAGNOSIS — I5032 Chronic diastolic (congestive) heart failure: Secondary | ICD-10-CM

## 2017-10-29 LAB — LIPID PANEL
Cholesterol: 149 mg/dL (ref 0–200)
HDL: 39 mg/dL — ABNORMAL LOW (ref >40)
LDL Cholesterol: 94 mg/dL (ref 0–99)
Total CHOL/HDL Ratio: 3.8 RATIO
Triglycerides: 79 mg/dL (ref <150)
VLDL: 16 mg/dL (ref 0–40)

## 2017-10-29 LAB — CBC
HCT: 33 % — ABNORMAL LOW (ref 36.0–46.0)
Hemoglobin: 10.8 g/dL — ABNORMAL LOW (ref 12.0–15.0)
MCH: 26.8 pg (ref 26.0–34.0)
MCHC: 32.7 g/dL (ref 30.0–36.0)
MCV: 81.9 fL (ref 78.0–100.0)
Platelets: 321 10*3/uL (ref 150–400)
RBC: 4.03 MIL/uL (ref 3.87–5.11)
RDW: 14 % (ref 11.5–15.5)
WBC: 15.1 10*3/uL — ABNORMAL HIGH (ref 4.0–10.5)

## 2017-10-29 LAB — BASIC METABOLIC PANEL
Anion gap: 8 (ref 5–15)
BUN: 20 mg/dL (ref 6–20)
CO2: 25 mmol/L (ref 22–32)
Calcium: 8.7 mg/dL — ABNORMAL LOW (ref 8.9–10.3)
Chloride: 105 mmol/L (ref 101–111)
Creatinine, Ser: 1.12 mg/dL — ABNORMAL HIGH (ref 0.44–1.00)
GFR calc Af Amer: 56 mL/min — ABNORMAL LOW (ref >60)
GFR calc non Af Amer: 49 mL/min — ABNORMAL LOW (ref >60)
Glucose, Bld: 124 mg/dL — ABNORMAL HIGH (ref 65–99)
Potassium: 3.2 mmol/L — ABNORMAL LOW (ref 3.5–5.1)
Sodium: 138 mmol/L (ref 135–145)

## 2017-10-29 LAB — HEPARIN LEVEL (UNFRACTIONATED)
Heparin Unfractionated: 0.75 IU/mL — ABNORMAL HIGH (ref 0.30–0.70)
Heparin Unfractionated: 0.62 IU/mL (ref 0.30–0.70)
Heparin Unfractionated: 0.33 IU/mL (ref 0.30–0.70)

## 2017-10-29 MED ORDER — LEVOFLOXACIN IN D5W 500 MG/100ML IV SOLN
500.0000 mg | INTRAVENOUS | Status: DC
  Administered 2017-10-29: 500 mg via INTRAVENOUS
  Filled 2017-10-29: qty 100

## 2017-10-29 MED ORDER — LEVOFLOXACIN 500 MG PO TABS
500.0000 mg | ORAL_TABLET | Freq: Every day | ORAL | Status: AC
  Administered 2017-10-30 – 2017-11-03 (×5): 500 mg via ORAL
  Filled 2017-10-29 (×5): qty 1

## 2017-10-29 MED ORDER — PREMIER PROTEIN SHAKE
11.0000 [oz_av] | Freq: Two times a day (BID) | ORAL | Status: DC
  Administered 2017-10-29 – 2017-11-03 (×7): 11 [oz_av] via ORAL
  Filled 2017-10-29 (×11): qty 325.31

## 2017-10-29 MED ORDER — LEVOFLOXACIN 500 MG PO TABS: 500.0000 mg | ORAL_TABLET | Freq: Every day | ORAL | Status: DC

## 2017-10-29 MED ORDER — IPRATROPIUM-ALBUTEROL 0.5-2.5 (3) MG/3ML IN SOLN
3.0000 mL | Freq: Four times a day (QID) | RESPIRATORY_TRACT | Status: DC
  Administered 2017-10-29 – 2017-10-30 (×3): 3 mL via RESPIRATORY_TRACT
  Filled 2017-10-29 (×3): qty 3

## 2017-10-29 MED ORDER — HYDROCODONE-HOMATROPINE 5-1.5 MG/5ML PO SYRP
5.0000 mL | ORAL_SOLUTION | Freq: Four times a day (QID) | ORAL | Status: DC | PRN
  Administered 2017-10-29 – 2017-11-03 (×12): 5 mL via ORAL
  Filled 2017-10-29 (×12): qty 5

## 2017-10-29 MED ORDER — DOCUSATE SODIUM 100 MG PO CAPS
100.0000 mg | ORAL_CAPSULE | Freq: Two times a day (BID) | ORAL | Status: DC
  Administered 2017-10-29 – 2017-11-03 (×10): 100 mg via ORAL
  Filled 2017-10-29 (×11): qty 1

## 2017-10-29 MED ORDER — POTASSIUM CHLORIDE CRYS ER 20 MEQ PO TBCR
40.0000 meq | EXTENDED_RELEASE_TABLET | Freq: Once | ORAL | Status: AC
Start: 2017-10-29 — End: 2017-10-29
  Administered 2017-10-29: 40 meq via ORAL
  Filled 2017-10-29: qty 2

## 2017-10-29 MED ORDER — SODIUM CHLORIDE 0.9 % IV SOLN
INTRAVENOUS | Status: AC
  Administered 2017-10-29 – 2017-10-31 (×3): via INTRAVENOUS

## 2017-10-29 MED ORDER — POLYETHYLENE GLYCOL 3350 17 G PO PACK
17.0000 g | PACK | Freq: Every day | ORAL | Status: DC
  Administered 2017-10-29 – 2017-11-03 (×6): 17 g via ORAL
  Filled 2017-10-29 (×6): qty 1

## 2017-10-29 MED ORDER — HEPARIN (PORCINE) IN NACL 100-0.45 UNIT/ML-% IJ SOLN
1150.0000 [IU]/h | INTRAMUSCULAR | Status: DC
  Administered 2017-10-29: 1150 [IU]/h via INTRAVENOUS
  Filled 2017-10-29: qty 250

## 2017-10-29 MED ORDER — HYDROCODONE-HOMATROPINE 5-1.5 MG/5ML PO SYRP
5.0000 mL | ORAL_SOLUTION | Freq: Three times a day (TID) | ORAL | Status: DC | PRN
  Administered 2017-10-29: 5 mL via ORAL
  Filled 2017-10-29: qty 5

## 2017-10-29 MED ORDER — HEPARIN (PORCINE) IN NACL 100-0.45 UNIT/ML-% IJ SOLN: 1100.0000 [IU]/h | INTRAMUSCULAR | Status: DC

## 2017-10-29 NOTE — Progress Notes (Signed)
[facility] PROGRESS NOTE  Erica Morris FAO:[id number] DOB: 02-Feb-1946 DOA: 10/28/2017 PCP: Zenia Resides, MD  Interim summary and history of present illness 71 y.o. female with medical history significant of hypertension, hyperlipidemia, stroke, GERD, hypothyroidism, dCHF, who presents with cough, shortness breath, chest pain, abdominal pain, rashes. Patient states that she has been having dry cough and shortness of breath for almost 3 weeks, which has worsened in the past several days. Patient also has pleuritic chest pain. The chest pain is located in the left side of chest, constant, sharp, 10 out of 10 in severity, aggravated by coughing. Patient has chills, but no fever. Patient states that she has nausea, but no vomiting or diarrhea. She has abdominal pain, which is located in the central abdomen, constant, 10 out of 10 in severity, sharp. Her abdominal pain has been going on for about 1 week. Patient states she has mild dysuria, but no burning on urination or increased urinary frequency. Patient also has rashes in right leg and neck area, which has been going on for almost a month. Pt was seen by PCP on 11/27 for cough and SOB, was started with azithromycin and prednisone for possible bronchitis, without significant improvement.   Assessment/Plan: 1-chest pain: With obesity, hypertension, hyperlipidemia as risk factors for ACS. -She has not had any stratification in the past -Troponins elevated with some diffuse ST depression changes at the moment of admission.   meeting criteria for NSTEMI -2D echo given reassurance -After discussing with cardiology plan is to pursued left heart cath on 12/3 -Continue heparin for 48 hours, continue aspirin and statins.  2-chronic diastolic heart failure -No abnormality seen or describe on current echo regarding diastolic dysfunction. -Preserved ejection fraction appreciated. -Appears to be compensated at this moment -Follow daily weights  and Strict intake and output -Continue heart healthy diet. -Continue daily Lasix.  3-acute bronchitis in the setting of chronic COPD -Continue treatment with steroids, antibiotics, antitussives and nebulizer -Except for the cough everything up at all much better.  4-UTI -Continue empirical treatment with Levaquin -Follow urine culture results. -Patient currently denying dysuria.  5-hypokalemia -Will follow electrolytes and replete as needed -K 3.2 today  6-hypertension -Overall well controlled. -Continue current antihypertensive regimen.  7-hyperlipidemia -Continue Lipitor -Repeat lipid panel with LDL of 94.  8-morbid obesity: class 2 -Body mass index is 34.16 kg/m. -low calorie diet and increase physical activity discussed with patient  9-bilateral groin fungal dermatitis. -Keep area clean and dry -Continue the use of nystatin powder -Complete treatment with oral Diflucan (day 2 out of 5).  10-GERD -Continue PPI  11-allergic rhinitis/nasal congestion and postnasal drip -Will continue the use of Flonase and loratadine  Code Status: Full code Family Communication: No family at bedside Disposition Plan: Remains inpatient, continue treatment with steroids, antibiotics, antitussives, and medications associated with nasal congestion/postnasal drip (Flonase and loratadine).  Currently chest pain-free and with plans for cardiac cath most likely Monday (11/01/17).   Consultants:  Cardiology service  Procedures:  2D echo - Left ventricle: The cavity size was normal. Wall thickness was   increased in a pattern of mild LVH. Systolic function was normal.   The estimated ejection fraction was in the range of 60% to 65%. - Mitral valve: There was mild regurgitation. - Pulmonary arteries: PA peak pressure: 34 mm Hg (S).  Antibiotics:  Levaquin 11/29>>>  HPI/Subjective: Afebrile, no chest pain, patient denies nausea, vomiting, abdominal pain, dysuria or severe shortness  of breath.  Still with intermittent cough and  at times getting short of breath and in mild respiratory distress with none stopping cough.  Objective: Vitals:   10/29/17 1139 10/29/17 1245  BP:  (!) 150/77  Pulse:  68  Resp:  16  Temp:  98.7 F (37.1 C)  SpO2: 98% 96%    Intake/Output Summary (Last 24 hours) at 10/29/2017 1728 Last data filed at 10/29/2017 1725 Gross per 24 hour  Intake 347.27 ml  Output 400 ml  Net -52.73 ml   Filed Weights   10/28/17 0502  Weight: 90.3 kg (199 lb)    Exam:   General: No fever, patient reporting intermittent coughing spells, nasal congestion and mild shortness of breath.  No chest pain.   Cardiovascular: S1 and S2, no rubs, no gallops, no murmurs  Respiratory: No wheezing, no crackles, good air movement.  Scattered rhonchi.  Abdomen: Obese, soft, nontender, no guarding, positive bowel sounds  Musculoskeletal: No edema, no cyanosis or clubbing.  Skin: Patient with tiny scratching lesions on her right lower extremity without drainage or signs of superimposed infection, bilateral groins with fungal dermatitis changes, no petechiae, no rashes.  Data Reviewed: Basic Metabolic Panel: Recent Labs  Lab 10/28/17 0425 10/28/17 0700 10/29/17 0448  NA 139  --  138  K 3.2*  --  3.2*  CL 105  --  105  CO2 24  --  25  GLUCOSE 127*  --  124*  BUN 10  --  20  CREATININE 0.87  --  1.12*  CALCIUM 8.8*  --  8.7*  MG  --  1.8  --    Liver Function Tests: Recent Labs  Lab 10/28/17 0700  AST 35  ALT 20  ALKPHOS 111  BILITOT 1.0  PROT 7.0  ALBUMIN 3.5   Recent Labs  Lab 10/28/17 0700  LIPASE 17   CBC: Recent Labs  Lab 10/28/17 0425 10/29/17 0304  WBC 16.9* 15.1*  NEUTROABS 14.1*  --   HGB 12.0 10.8*  HCT 36.4 33.0*  MCV 80.5 81.9  PLT 339 321   Cardiac Enzymes: Recent Labs  Lab 10/28/17 0425 10/28/17 0700 10/28/17 1215 10/28/17 1846  CKTOTAL 319*  --   --   --   CKMB 22.3*  --   --   --   TROPONINI  --  1.64*  1.49* 1.07*   BNP (last 3 results) Recent Labs    10/03/17 2104 10/28/17 0425  BNP 86.5 271.5*    Recent Results (from the past 240 hour(s))  Respiratory Panel by PCR     Status: None   Collection Time: 10/28/17  6:47 AM  Result Value Ref Range Status   Adenovirus NOT DETECTED NOT DETECTED Final   Coronavirus 229E NOT DETECTED NOT DETECTED Final   Coronavirus HKU1 NOT DETECTED NOT DETECTED Final   Coronavirus NL63 NOT DETECTED NOT DETECTED Final   Coronavirus OC43 NOT DETECTED NOT DETECTED Final   Metapneumovirus NOT DETECTED NOT DETECTED Final   Rhinovirus / Enterovirus NOT DETECTED NOT DETECTED Final   Influenza A NOT DETECTED NOT DETECTED Final   Influenza B NOT DETECTED NOT DETECTED Final   Parainfluenza Virus 1 NOT DETECTED NOT DETECTED Final   Parainfluenza Virus 2 NOT DETECTED NOT DETECTED Final   Parainfluenza Virus 3 NOT DETECTED NOT DETECTED Final   Parainfluenza Virus 4 NOT DETECTED NOT DETECTED Final   Respiratory Syncytial Virus NOT DETECTED NOT DETECTED Final   Bordetella pertussis NOT DETECTED NOT DETECTED Final   Chlamydophila pneumoniae NOT DETECTED NOT DETECTED Final  Mycoplasma pneumoniae NOT DETECTED NOT DETECTED Final    Comment: Performed at [facility], [place] [address], [place], [place] [address]  Urine Culture     Status: None (Preliminary result)   Collection Time: 10/28/17  6:50 AM  Result Value Ref Range Status   Specimen Description URINE, CLEAN CATCH  Final   Special Requests NONE  Final   Culture   Final    CULTURE REINCUBATED FOR BETTER GROWTH Performed at [facility], [place] [address], [place], [place] [address]    Report Status PENDING  Incomplete  Culture, blood (Routine X 2) w Reflex to ID Panel     Status: None (Preliminary result)   Collection Time: 10/28/17  7:00 AM  Result Value Ref Range Status   Specimen Description BLOOD LEFT HAND  Final   Special Requests IN PEDIATRIC BOTTLE Blood Culture adequate volume  Final    Culture   Final    NO GROWTH 1 DAY Performed at [facility], [place] [address] [place], [place] [address]    Report Status PENDING  Incomplete  Culture, blood (Routine X 2) w Reflex to ID Panel     Status: None (Preliminary result)   Collection Time: 10/28/17  7:00 AM  Result Value Ref Range Status   Specimen Description BLOOD RIGHT ANTECUBITAL  Final   Special Requests   Final    BOTTLES DRAWN AEROBIC AND ANAEROBIC Blood Culture adequate volume   Culture   Final    NO GROWTH 1 DAY Performed at [facility], [place] [address], [place], [place] [address]    Report Status PENDING  Incomplete     Studies: Dg Chest 2 View  Result Date: 10/28/2017 CLINICAL DATA:  Productive cough. Chest pain and shortness of breath. EXAM: CHEST  2 VIEW COMPARISON:  Radiograph 10/03/2017 FINDINGS: The cardiomediastinal contours are normal. Aortic arch atherosclerosis. Pulmonary vasculature is normal. No consolidation, pleural effusion, or pneumothorax. No acute osseous abnormalities are seen. IMPRESSION: No acute abnormality or change from prior exam. Electronically Signed   By: Jeb Levering M.D.   On: 10/28/2017 03:28   Ct Abdomen Pelvis W Contrast  Result Date: 10/28/2017 CLINICAL DATA:  Diffuse abdominal pain and fever. Clinical suspicion for abscess. EXAM: CT ABDOMEN AND PELVIS WITH CONTRAST TECHNIQUE: Multidetector CT imaging of the abdomen and pelvis was performed using the standard protocol following bolus administration of intravenous contrast. CONTRAST:  156mL ISOVUE-300 IOPAMIDOL (ISOVUE-300) INJECTION 61% COMPARISON:  03/27/2008 FINDINGS: Lower Chest: Mild patchy airspace opacity is seen in the posterior right lower lobe, suspicious for pneumonia. Hepatobiliary: No hepatic masses identified. A few sub-cm calcified gallstones are seen, however there is no evidence of cholecystitis or biliary ductal dilatation. Pancreas:  No mass or inflammatory changes. Spleen: Within normal limits in size  and appearance. Adrenals/Urinary Tract: Normal adrenal glands. Bilateral renal sinus cysts are again seen, without evidence of hydronephrosis. A new lesion is seen in the posterior lower pole of the right kidney which shows evidence of mild contrast washout delayed imaging, suspicious for a solid renal neoplasm. This measures 3.3 x 2.8 cm. No other renal masses are identified. Unremarkable unopacified urinary bladder. Stomach/Bowel: No evidence of obstruction, inflammatory process or abnormal fluid collections. Mild diverticulosis involving the proximal sigmoid colon, without evidence of diverticulitis. Vascular/Lymphatic: No pathologically enlarged lymph nodes. No abdominal aortic aneurysm. Aortic atherosclerosis. Reproductive: Prior hysterectomy noted. Adnexal regions are unremarkable in appearance. Other:  None. Musculoskeletal:  No suspicious bone lesions identified.  IMPRESSION: No evidence of abdominal or pelvic abscess or acute inflammatory process. 3.3 cm lesion in lower pole of right kidney, suspicious for solid renal neoplasm. Recommend abdomen MRI without and with contrast for further characterization. No evidence of metastatic disease. Mild airspace opacity in posterior right lower lobe, suspicious for pneumonia. Cholelithiasis.  No radiographic evidence of cholecystitis. Colonic diverticulosis, without radiographic evidence of diverticulitis. Electronically Signed   By: Earle Gell M.D.   On: 10/28/2017 12:41    Scheduled Meds: . aspirin  81 mg Oral Daily  . atorvastatin  80 mg Oral Daily  . dextromethorphan-guaiFENesin  1 tablet Oral BID  . docusate sodium  100 mg Oral BID  . fluconazole  100 mg Oral Daily  . fluticasone  1 spray Each Nare Daily  . furosemide  20 mg Oral Daily  . ipratropium-albuterol  3 mL Nebulization Q6H  . levothyroxine  50 mcg Oral QAC breakfast  . loratadine  10 mg Oral Daily  . metoprolol tartrate  25 mg Oral BID  . nystatin   Topical TID  . pantoprazole  40 mg  Oral BID  . polyethylene glycol  17 g Oral Daily  . potassium chloride  40 mEq Oral Once  . predniSONE  50 mg Oral Q breakfast  . protein supplement shake  11 oz Oral BID BM  . sodium chloride flush  3 mL Intravenous Q12H  . venlafaxine XR  75 mg Oral Q breakfast   Continuous Infusions: . sodium chloride    . sodium chloride 50 mL/hr at 10/29/17 0953  . heparin 1,100 Units/hr (10/29/17 0552)  . levofloxacin (LEVAQUIN) IV Stopped (10/29/17 1510)    Principal Problem:   Chest pain Active Problems:   Essential hypertension   GERD   Abdominal pain   Pure hypercholesterolemia   History of CVA (cerebrovascular accident)   Hypothyroidism   Diastolic CHF ([place])   Cough   NSTEMI (non-ST elevated myocardial infarction) ([place])   Rash    Time spent: 30 minutes    [facility] Pager [phone] . If 7PM-7AM, please contact night-coverage at [email or web address], password [facility] 10/29/2017, 5:28 PM  LOS: 1 day

## 2017-10-29 NOTE — Progress Notes (Signed)
ANTICOAGULATION CONSULT NOTE - Follow Up Consult  Pharmacy Consult for heparin Indication: chest pain/ACS  Allergies  Allergen Reactions  . Lisinopril Cough  . Codeine Phosphate Itching  . Morphine And Related Itching    Burning sensation and itchiness with IV morphine    Patient Measurements: Height: 5\' 4"  (162.6 cm) Weight: 199 lb (90.3 kg) IBW/kg (Calculated) : 54.7 Heparin Dosing Weight: 75kg  Vital Signs: Temp: 98.7 F (37.1 C) (11/30 1245) Temp Source: Oral (11/30 1245) BP: 150/77 (11/30 1245) Pulse Rate: 68 (11/30 1245)  Labs: Recent Labs    10/28/17 0425 10/28/17 0700 10/28/17 1215 10/28/17 1524 10/28/17 1846 10/29/17 0304 10/29/17 0448 10/29/17 1209  HGB 12.0  --   --   --   --  10.8*  --   --   HCT 36.4  --   --   --   --  33.0*  --   --   PLT 339  --   --   --   --  321  --   --   LABPROT  --   --   --  15.0  --   --   --   --   INR  --   --   --  1.19  --   --   --   --   HEPARINUNFRC  --   --   --  0.14*  --  0.75*  --  0.62  CREATININE 0.87  --   --   --   --   --  1.12*  --   CKTOTAL 319*  --   --   --   --   --   --   --   CKMB 22.3*  --   --   --   --   --   --   --   TROPONINI  --  1.64* 1.49*  --  1.07*  --   --   --     Estimated Creatinine Clearance: 50.8 mL/min (A) (by C-G formula based on SCr of 1.12 mg/dL (H)).   Medical History: Past Medical History:  Diagnosis Date  . ABDOMINAL PAIN, RECURRENT 03/29/2008  . ANXIETY 10/07/2007  . Candidiasis of mouth 05/30/2010  . GERD 07/25/2007  . Hernia, hiatal   . HYPERTENSION 07/25/2007  . Stroke [facility])     Assessment: 68 YOF presents with shortness of breath.  Recent diagnosis of bronchitis.  In ED, troponin noted to be elevated w/ ST segment depression (per EDP note).  Pharmacy asked to dose heparin gtt for rule out ACS.  No documented anticoagulant use prior to admission  Today, 10/29/2017  Heparin level now therapeutic with infusion at 1100 units/hr   CBC: Hgb and pltc WNL at  admission, Hgb decreased slightly today and pltc stable  Renal: SCr slightly increased  Per cardiology, plan for cardiac cath on Monday and recommend continuing heparin for 48 hours.  Goal of Therapy:  Heparin level 0.3-0.7 units/ml Monitor platelets by anticoagulation protocol: Yes   Plan:    Continue heparin infusion at 1100 units/hr  Recheck heparin level in 8h to confirm rate  Daily heparin level and CBC  Monitor for bleeding  Hershal Coria, PharmD, BCPS Pager: [phone] 10/29/2017 2:08 PM

## 2017-10-29 NOTE — Progress Notes (Signed)
Pharmacy - IV heparin  Assessment:    Please see note from Doreene Eland, PharmD 11/29 for full details.  Briefly, 71 y.o. female on IV heparin for r/o ACS.    First heparin level SUBtherapeutic at 0.14 on 900 units/hr  0304 HL=0.74 supratherapeutic on 1200 units/hr  No line or bleeding issues per RN  Plan:   Decrease heparin drip to 1100 units/hr  Check heparin level 8 hrs after rate change  Daily CBC, daily heparin level once stable  Monitor for signs of bleeding or thrombosis    Dorrene German 10/29/2017, 3:54 AM

## 2017-10-29 NOTE — Progress Notes (Signed)
Initial Nutrition Assessment  DOCUMENTATION CODES:   Obesity unspecified  INTERVENTION:   Provide Premier Protein BID, each supplement provides 160kcal and 30g protein.   NUTRITION DIAGNOSIS:   Inadequate oral intake related to decreased appetite as evidenced by per patient/family report.  GOAL:   Patient will meet greater than or equal to 90% of their needs  MONITOR:   PO intake, Supplement acceptance, Weight trends, Labs  REASON FOR ASSESSMENT:   Malnutrition Screening Tool    ASSESSMENT:   Pt with PMH significant for GERD, hiatal hernia, HTN, HLD, dCHF, and stroke. Presents this admission with complaints of dry cough and shortness of breath that has worsened over several days. Admitted for chest pain with positive troponin.    Spoke with pt at bedside.  Reports having decreased PO intake for > 1 month related to swallowing and breathing issues. States she will often have three meals per day, but will only finish 50% of them. Meals consist of spaghetti, meat, grains, and vegetables. Pt complains of choking on food often and coughing immediatly after eating. Explains this may be due to her "thyroid issues". Pt amendable to Premier Protein this hospital stay.  Pt reports losing 7 lb in a 1 month time frame. Records indicate pt has lost 3.3% of body weight in two week time period. This is not significant.  Nutrition-Focused physical exam completed.   Medications reviewed and include: lasix, KCl, prednisone, NS @ 50 ml/hr, IV abx Labs reviewed: K 3.2 (L) Creatinine 1.12 (H)  NUTRITION - FOCUSED PHYSICAL EXAM:    Most Recent Value  Orbital Region  No depletion  Upper Arm Region  No depletion  Thoracic and Lumbar Region  Unable to assess  Buccal Region  No depletion  Temple Region  No depletion  Clavicle Bone Region  No depletion  Clavicle and Acromion Bone Region  No depletion  Scapular Bone Region  Unable to assess  Dorsal Hand  No depletion  Patellar Region  No  depletion  Anterior Thigh Region  No depletion  Posterior Calf Region  No depletion  Edema (RD Assessment)  Mild  Hair  Reviewed  Eyes  Reviewed  Mouth  Reviewed  Skin  Reviewed  Nails  Reviewed       Diet Order:  Diet Heart Room service appropriate? Yes; Fluid consistency: Thin  EDUCATION NEEDS:   Not appropriate for education at this time  Skin:  Skin Assessment: Reviewed RN Assessment  Last BM:  10/26/17  Height:   Ht Readings from Last 1 Encounters:  10/28/17 5\' 4"  (1.626 m)    Weight:   Wt Readings from Last 1 Encounters:  10/28/17 199 lb (90.3 kg)    Ideal Body Weight:  54.5 kg  BMI:  Body mass index is 34.16 kg/m.  Estimated Nutritional Needs:   Kcal:  1600-1800 kcal/day  Protein:  70-80 g/day  Fluid:  >1.6 L/day    Mariana Single RD, LDN Clinical Nutrition Pager # - [phone]

## 2017-10-29 NOTE — Care Management Note (Signed)
Case Management Note  Patient Details  Name: Erica Morris MRN: [id number] Date of Birth: 1946/03/29  Subjective/Objective:  Pt admitted with cco chest pain, coughing, abd pain. Pt was found to have elevated troponin and acute ischemic EKG changes, COPD with exacerbation. Pt for Cardiac Cath on Monday.               Action/Plan:  Plan to discharge home   Expected Discharge Date:  (unknown)               Expected Discharge Plan:  Home/Self Care  In-House Referral:     Discharge planning Services  CM Consult  Post Acute Care Choice:    Choice offered to:     DME Arranged:    DME Agency:     HH Arranged:    HH Agency:     Status of Service:  In process, will continue to follow  If discussed at Long Length of Stay Meetings, dates discussed:    Additional CommentsPurcell Mouton, RN 10/29/2017, 4:02 PM

## 2017-10-29 NOTE — Progress Notes (Signed)
ANTICOAGULATION CONSULT NOTE - Follow Up Consult  Pharmacy Consult for heparin Indication: chest pain/ACS  Heparin Dosing Weight: 75kg  Assessment: 71 YOF presents with shortness of breath.  Recent diagnosis of bronchitis.  In ED, troponin noted to be elevated w/ ST segment depression (per EDP note).  Pharmacy asked to dose heparin gtt for rule out ACS.  11/29 15:24 first heparin level SUBtherapeutic at 0.14 on 900 units/hr 11/30 03:04 HL=0.74, supratherapeutic on 1200 units/hr 11/30 12:09 HL = 0.62, therapeutic on 1100 units/hr  11/30 19/40:  Heparin level 0.33, remains therapeutic but with large decrease. No bleeding or complications reported    Goal of Therapy:  Heparin level 0.3-0.7 units/ml Monitor platelets by anticoagulation protocol: Yes   Plan:    Empiric increase to heparin infusion at 1150 units/hr  Recheck heparin level in 8h to confirm rate  Daily heparin level and CBC  Monitor for bleeding  Gretta Arab PharmD, BCPS Pager [phone] 10/29/2017 8:21 PM

## 2017-10-29 NOTE — Progress Notes (Addendum)
Progress Note  Patient Name: Erica Morris Date of Encounter: 10/29/2017  Primary Cardiologist: New- Dr Meda Coffee  Subjective   No chest pain, still coughing very frequently, non-productive.   Inpatient Medications    Scheduled Meds: . aspirin  81 mg Oral Daily  . atorvastatin  80 mg Oral Daily  . dextromethorphan-guaiFENesin  1 tablet Oral BID  . doxycycline  100 mg Oral Q12H  . fluconazole  100 mg Oral Daily  . fluticasone  1 spray Each Nare Daily  . furosemide  20 mg Oral Daily  . ipratropium-albuterol  3 mL Nebulization Q4H  . levothyroxine  50 mcg Oral QAC breakfast  . loratadine  10 mg Oral Daily  . metoprolol tartrate  25 mg Oral BID  . nystatin   Topical TID  . pantoprazole  40 mg Oral BID  . potassium chloride  40 mEq Oral Once  . potassium chloride  40 mEq Oral Once  . predniSONE  50 mg Oral Q breakfast  . sodium chloride flush  3 mL Intravenous Q12H  . venlafaxine XR  75 mg Oral Q breakfast   Continuous Infusions: . sodium chloride    . sodium chloride 100 mL/hr at 10/29/17 0554  . heparin 1,100 Units/hr (10/29/17 0552)   PRN Meds: sodium chloride, acetaminophen, albuterol, ALPRAZolam, diphenhydrAMINE, hydrALAZINE, iopamidol, morphine injection, nitroGLYCERIN, ondansetron, sodium chloride flush, zolpidem   Vital Signs    Vitals:   10/29/17 0018 10/29/17 0417 10/29/17 0445 10/29/17 0718  BP:   118/61   Pulse:   66   Resp:   20   Temp:   98.1 F (36.7 C)   TempSrc:   Oral   SpO2: 95% 95% 95% 96%  Weight:      Height:        Intake/Output Summary (Last 24 hours) at 10/29/2017 0829 Last data filed at 10/29/2017 1696 Gross per 24 hour  Intake 347.27 ml  Output 200 ml  Net 147.27 ml   Filed Weights   10/28/17 0502  Weight: 199 lb (90.3 kg)    Telemetry    Sinus rhythm, ST depression not as prominent as yesterday - Personally Reviewed  ECG    Sinus rhythm - Personally Reviewed  Physical Exam  Cant stop coughing  GEN: No acute  distress.   Neck: No JVD Cardiac: RRR, no murmurs, rubs, or gallops.  Respiratory: wheezing B/L,  GI: Soft, nontender, non-distended  MS: No edema; No deformity. Neuro:  Nonfocal  Psych: Normal affect   Labs    Chemistry Recent Labs  Lab 10/28/17 0425 10/28/17 0700 10/29/17 0448  NA 139  --  138  K 3.2*  --  3.2*  CL 105  --  105  CO2 24  --  25  GLUCOSE 127*  --  124*  BUN 10  --  20  CREATININE 0.87  --  1.12*  CALCIUM 8.8*  --  8.7*  PROT  --  7.0  --   ALBUMIN  --  3.5  --   AST  --  35  --   ALT  --  20  --   ALKPHOS  --  111  --   BILITOT  --  1.0  --   GFRNONAA >60  --  49*  GFRAA >60  --  56*  ANIONGAP 10  --  8     Hematology Recent Labs  Lab 10/28/17 0425 10/29/17 0304  WBC 16.9* 15.1*  RBC 4.52 4.03  HGB  12.0 10.8*  HCT 36.4 33.0*  MCV 80.5 81.9  MCH 26.5 26.8  MCHC 33.0 32.7  RDW 13.8 14.0  PLT 339 321    Cardiac Enzymes Recent Labs  Lab 10/28/17 0700 10/28/17 1215 10/28/17 1846  TROPONINI 1.64* 1.49* 1.07*    Recent Labs  Lab 10/28/17 0443  TROPIPOC 1.75*     BNP Recent Labs  Lab 10/28/17 0425  BNP 271.5*     DDimer No results for input(s): DDIMER in the last 168 hours.   Radiology    Dg Chest 2 View  Result Date: 10/28/2017 CLINICAL DATA:  Productive cough. Chest pain and shortness of breath. EXAM: CHEST  2 VIEW COMPARISON:  Radiograph 10/03/2017 FINDINGS: The cardiomediastinal contours are normal. Aortic arch atherosclerosis. Pulmonary vasculature is normal. No consolidation, pleural effusion, or pneumothorax. No acute osseous abnormalities are seen. IMPRESSION: No acute abnormality or change from prior exam. Electronically Signed   By: Jeb Levering M.D.   On: 10/28/2017 03:28   Ct Abdomen Pelvis W Contrast  Result Date: 10/28/2017 CLINICAL DATA:  Diffuse abdominal pain and fever. Clinical suspicion for abscess. EXAM: CT ABDOMEN AND PELVIS WITH CONTRAST TECHNIQUE: Multidetector CT imaging of the abdomen and pelvis  was performed using the standard protocol following bolus administration of intravenous contrast. CONTRAST:  132mL ISOVUE-300 IOPAMIDOL (ISOVUE-300) INJECTION 61% COMPARISON:  03/27/2008 FINDINGS: Lower Chest: Mild patchy airspace opacity is seen in the posterior right lower lobe, suspicious for pneumonia. Hepatobiliary: No hepatic masses identified. A few sub-cm calcified gallstones are seen, however there is no evidence of cholecystitis or biliary ductal dilatation. Pancreas:  No mass or inflammatory changes. Spleen: Within normal limits in size and appearance. Adrenals/Urinary Tract: Normal adrenal glands. Bilateral renal sinus cysts are again seen, without evidence of hydronephrosis. A new lesion is seen in the posterior lower pole of the right kidney which shows evidence of mild contrast washout delayed imaging, suspicious for a solid renal neoplasm. This measures 3.3 x 2.8 cm. No other renal masses are identified. Unremarkable unopacified urinary bladder. Stomach/Bowel: No evidence of obstruction, inflammatory process or abnormal fluid collections. Mild diverticulosis involving the proximal sigmoid colon, without evidence of diverticulitis. Vascular/Lymphatic: No pathologically enlarged lymph nodes. No abdominal aortic aneurysm. Aortic atherosclerosis. Reproductive: Prior hysterectomy noted. Adnexal regions are unremarkable in appearance. Other:  None. Musculoskeletal:  No suspicious bone lesions identified. IMPRESSION: No evidence of abdominal or pelvic abscess or acute inflammatory process. 3.3 cm lesion in lower pole of right kidney, suspicious for solid renal neoplasm. Recommend abdomen MRI without and with contrast for further characterization. No evidence of metastatic disease. Mild airspace opacity in posterior right lower lobe, suspicious for pneumonia. Cholelithiasis.  No radiographic evidence of cholecystitis. Colonic diverticulosis, without radiographic evidence of diverticulitis. Electronically  Signed   By: Earle Gell M.D.   On: 10/28/2017 12:41    Cardiac Studies   Echocardiogram 10/28/17 ------------------------------------------------------------------- Study Conclusions  - Left ventricle: The cavity size was normal. Wall thickness was   increased in a pattern of mild LVH. Systolic function was normal.   The estimated ejection fraction was in the range of 60% to 65%. - Mitral valve: There was mild regurgitation. - Pulmonary arteries: PA peak pressure: 34 mm Hg (S).   Patient Profile     71 y.o. female with a hx of hypertension, hyperlipidemia, stroke, GERD, hypothyroidism, diastolic CHF who is being seen today for the evaluation of chest pain and elevated troponin. EKG with diffuse ST depression. Also has UTI and bronchitis.  Assessment & Plan    Non-STEMI -In setting of bronchtitis and UTI with leukocytosis -Troponin on presentation 1.75, trended down to 1.07, continue heparin for 48 hours -Mostly atypical pleuritic chest pain, but also has had some underlying typical exertional chest pressure with dyspnea over the past few months.  -EKG with new diffuse ST depression, less prominent today.  -IV heparin infusing. Plan for cardiac cath on Monday, hoping her bronchitis improves by then.   Chronic diastolic CHF -Presumptive with previous echo in 10/2016 showing EF 60-65%. Treated with diuretic ofr peripheral edema and shortness of breath.  -Echo done yesterday showed EF 60-65%, mild MR, PA peak pressure 34 mmHg, mild LVH -Pt appears euvolemic  Acute bronchitis -Frequent nonproductive cough. -CXR with no acute abnormality  -WBCs 16.9. Management per IM.  - would change doxycycline to levaquin iv as it would cover both UTI and possible pneumonia  UTI -Being treated with doxycycline per IM  Hypokalemia -K+ 3.2 today. Being replaced.   Hypertension -BP elevated yest am, now well controlled.  Hyperlipidemia -LDL 176 in 10/2016, patient is on atorvastatin  80 mg daily -Updated lipid panel with LDL 94  For questions or updates, please contact [place] Please consult [email or web address] for contact info under Cardiology/STEMI.   Ena Dawley, MD 10/29/2017

## 2017-10-30 DIAGNOSIS — N2889 Other specified disorders of kidney and ureter: Secondary | ICD-10-CM

## 2017-10-30 DIAGNOSIS — R0789 Other chest pain: Secondary | ICD-10-CM

## 2017-10-30 DIAGNOSIS — R05 Cough: Secondary | ICD-10-CM

## 2017-10-30 DIAGNOSIS — I214 Non-ST elevation (NSTEMI) myocardial infarction: Secondary | ICD-10-CM

## 2017-10-30 HISTORY — DX: Non-ST elevation (NSTEMI) myocardial infarction: I21.4

## 2017-10-30 LAB — CBC
HCT: 33.7 % — ABNORMAL LOW (ref 36.0–46.0)
Hemoglobin: 10.8 g/dL — ABNORMAL LOW (ref 12.0–15.0)
MCH: 26.5 pg (ref 26.0–34.0)
MCHC: 32 g/dL (ref 30.0–36.0)
MCV: 82.6 fL (ref 78.0–100.0)
Platelets: 330 10*3/uL (ref 150–400)
RBC: 4.08 MIL/uL (ref 3.87–5.11)
RDW: 14.1 % (ref 11.5–15.5)
WBC: 15.5 10*3/uL — ABNORMAL HIGH (ref 4.0–10.5)

## 2017-10-30 LAB — HEPARIN LEVEL (UNFRACTIONATED)
Heparin Unfractionated: 0.78 [IU]/mL — ABNORMAL HIGH (ref 0.30–0.70)
Heparin Unfractionated: 0.53 [IU]/mL (ref 0.30–0.70)

## 2017-10-30 MED ORDER — BENZONATATE 100 MG PO CAPS
200.0000 mg | ORAL_CAPSULE | Freq: Three times a day (TID) | ORAL | Status: DC
  Administered 2017-10-30 – 2017-11-03 (×12): 200 mg via ORAL
  Filled 2017-10-30 (×12): qty 2

## 2017-10-30 MED ORDER — HEPARIN (PORCINE) IN NACL 100-0.45 UNIT/ML-% IJ SOLN
1050.0000 [IU]/h | INTRAMUSCULAR | Status: DC
  Administered 2017-10-30 – 2017-11-01 (×3): 1050 [IU]/h via INTRAVENOUS
  Filled 2017-10-30 (×3): qty 250

## 2017-10-30 MED ORDER — IPRATROPIUM-ALBUTEROL 0.5-2.5 (3) MG/3ML IN SOLN
3.0000 mL | Freq: Two times a day (BID) | RESPIRATORY_TRACT | Status: DC
  Administered 2017-10-30 – 2017-11-01 (×4): 3 mL via RESPIRATORY_TRACT
  Filled 2017-10-30 (×4): qty 3

## 2017-10-30 MED ORDER — PREDNISONE 20 MG PO TABS
40.0000 mg | ORAL_TABLET | Freq: Every day | ORAL | Status: DC
  Administered 2017-10-31 – 2017-11-02 (×3): 40 mg via ORAL
  Filled 2017-10-30 (×3): qty 2

## 2017-10-30 NOTE — Progress Notes (Signed)
[facility] PROGRESS NOTE  Erica Morris ZDG:[id number] DOB: 1946/04/07 DOA: 10/28/2017 PCP: Zenia Resides, MD  Interim summary and history of present illness 71 y.o. female with medical history significant of hypertension, hyperlipidemia, stroke, GERD, hypothyroidism, dCHF, who presents with cough, shortness breath, chest pain, abdominal pain, rashes. Patient states that she has been having dry cough and shortness of breath for almost 3 weeks, which has worsened in the past several days. Patient also has pleuritic chest pain. The chest pain is located in the left side of chest, constant, sharp, 10 out of 10 in severity, aggravated by coughing. Patient has chills, but no fever. Patient states that she has nausea, but no vomiting or diarrhea. She has abdominal pain, which is located in the central abdomen, constant, 10 out of 10 in severity, sharp. Her abdominal pain has been going on for about 1 week. Patient states she has mild dysuria, but no burning on urination or increased urinary frequency. Patient also has rashes in right leg and neck area, which has been going on for almost a month. Pt was seen by PCP on 11/27 for cough and SOB, was started with azithromycin and prednisone for possible bronchitis, without significant improvement.   Assessment/Plan: 1-chest pain: With obesity, hypertension, hyperlipidemia as risk factors for ACS. -She has not had any stratification in the past -Troponins elevated with some diffuse ST depression changes at the moment of admission.   meeting criteria for NSTEMI -2D echo given reassurance: Preserved ejection fraction no wall motion abnormalities. -After discussing with cardiology plan is to pursued left heart cath on 12/3; for now continue medical management and hip pain. -Continue aspirin and statins.  2-chronic diastolic heart failure: Compensated. -No abnormality seen or describe on current echo regarding diastolic dysfunction. -Preserved  ejection fraction appreciated. -Appears to be compensated at this moment -Follow daily weights and Strict intake and output -Continue heart healthy diet. -Continue current dose of Lasix.  3-acute bronchitis in the setting of chronic COPD -Patient is currently no wheezing or complaining of significant shortness of breath -We will start steroids tapering, continue current antibiotics, continue antitussives and as needed nebulization.    4-UTI: With Enterococcus faecalis and a streptococcal Gllolyticus isolated -will follow sensitivity -continue treatment with levaquin -Patient currently denying dysuria.  5-hypokalemia -Will follow electrolytes and continue to replete as needed -Basic metabolic panel to be done in a.m.  6-hypertension -Blood pressure has remained well controlled -We will continue current antihypertensive regimen.  7-hyperlipidemia -LDL during this admission 94 -Continue Lipitor.  8-morbid obesity: class 2 -Body mass index is 34.16 kg/m. -low calorie diet and increase physical activity has been discussed with patient  9-bilateral groin fungal dermatitis. -Continue to keep area clean and dry -Continue nystatin powder.   -Complete treatment with oral Diflucan (day 3 out of 5).  10-GERD -Continue PPI  11-allergic rhinitis/nasal congestion and postnasal drip -Continue Flonase and loratadine.  12-renal mass -Given concerns on CT scan for potential renal carcinoma, will check dedicated MRI.  Code Status: Full code Family Communication: No family at bedside Disposition Plan: Remains inpatient, continue treatment with steroids, antibiotics, antitussives, and medications associated with nasal congestion/postnasal drip (Flonase and loratadine).  Currently chest pain-free and with plans for cardiac cath most likely Monday (11/01/17).   Consultants:  Cardiology service  Procedures:  2D echo - Left ventricle: The cavity size was normal. Wall thickness was    increased in a pattern of mild LVH. Systolic function was normal.   The estimated ejection fraction  was in the range of 60% to 65%. - Mitral valve: There was mild regurgitation. - Pulmonary arteries: PA peak pressure: 34 mm Hg (S).  Antibiotics:  Levaquin 11/29>>>  HPI/Subjective: No chest pain, no fever, no nausea vomiting.  Patient reports continue coughing spells and when he presents some shortness of breath.  Denies dysuria.  Objective: Vitals:   10/30/17 0835 10/30/17 1352  BP:  134/76  Pulse:  60  Resp:  16  Temp:  98.1 F (36.7 C)  SpO2: 96% 98%    Intake/Output Summary (Last 24 hours) at 10/30/2017 1746 Last data filed at 10/30/2017 1500 Gross per 24 hour  Intake 2112.33 ml  Output -  Net 2112.33 ml   Filed Weights   10/28/17 0502  Weight: 90.3 kg (199 lb)    Exam:   General: Afebrile, patient continue to experience intermittent coughing spells, present describes shortness of breath.  Some improvement of her nasal congestion and hoarseness.  Denies chest pain.  Cardiovascular: S1 and S2, no rubs, no gallops, no murmurs.    Respiratory: Good air movement bilaterally, no wheezing, no crackles, no using accessory muscles.    Abdomen: Obese, soft, nontender, no guarding, positive bowel sounds.    Musculoskeletal: No edema, no cyanosis or clubbing.  No edema, no cyanosis or clubbing.  Skin: Patient skin exam demonstrated improvement in the lesions in her lower extremities.  Continues to have groins fungal dermatitis.  (Patient describes less itching and there also less erythematosus).    Data Reviewed: Basic Metabolic Panel: Recent Labs  Lab 10/28/17 0425 10/28/17 0700 10/29/17 0448  NA 139  --  138  K 3.2*  --  3.2*  CL 105  --  105  CO2 24  --  25  GLUCOSE 127*  --  124*  BUN 10  --  20  CREATININE 0.87  --  1.12*  CALCIUM 8.8*  --  8.7*  MG  --  1.8  --    Liver Function Tests: Recent Labs  Lab 10/28/17 0700  AST 35  ALT 20  ALKPHOS 111   BILITOT 1.0  PROT 7.0  ALBUMIN 3.5   Recent Labs  Lab 10/28/17 0700  LIPASE 17   CBC: Recent Labs  Lab 10/28/17 0425 10/29/17 0304 10/30/17 0529  WBC 16.9* 15.1* 15.5*  NEUTROABS 14.1*  --   --   HGB 12.0 10.8* 10.8*  HCT 36.4 33.0* 33.7*  MCV 80.5 81.9 82.6  PLT 339 321 330   Cardiac Enzymes: Recent Labs  Lab 10/28/17 0425 10/28/17 0700 10/28/17 1215 10/28/17 1846  CKTOTAL 319*  --   --   --   CKMB 22.3*  --   --   --   TROPONINI  --  1.64* 1.49* 1.07*   BNP (last 3 results) Recent Labs    10/03/17 2104 10/28/17 0425  BNP 86.5 271.5*    Recent Results (from the past 240 hour(s))  Respiratory Panel by PCR     Status: None   Collection Time: 10/28/17  6:47 AM  Result Value Ref Range Status   Adenovirus NOT DETECTED NOT DETECTED Final   Coronavirus 229E NOT DETECTED NOT DETECTED Final   Coronavirus HKU1 NOT DETECTED NOT DETECTED Final   Coronavirus NL63 NOT DETECTED NOT DETECTED Final   Coronavirus OC43 NOT DETECTED NOT DETECTED Final   Metapneumovirus NOT DETECTED NOT DETECTED Final   Rhinovirus / Enterovirus NOT DETECTED NOT DETECTED Final   Influenza A NOT DETECTED NOT DETECTED Final  Influenza B NOT DETECTED NOT DETECTED Final   Parainfluenza Virus 1 NOT DETECTED NOT DETECTED Final   Parainfluenza Virus 2 NOT DETECTED NOT DETECTED Final   Parainfluenza Virus 3 NOT DETECTED NOT DETECTED Final   Parainfluenza Virus 4 NOT DETECTED NOT DETECTED Final   Respiratory Syncytial Virus NOT DETECTED NOT DETECTED Final   Bordetella pertussis NOT DETECTED NOT DETECTED Final   Chlamydophila pneumoniae NOT DETECTED NOT DETECTED Final   Mycoplasma pneumoniae NOT DETECTED NOT DETECTED Final    Comment: Performed at [facility], [place] [address] [place], [place] [address]  Urine Culture     Status: Abnormal (Preliminary result)   Collection Time: 10/28/17  6:50 AM  Result Value Ref Range Status   Specimen Description URINE, CLEAN CATCH  Final   Special  Requests NONE  Final   Culture (A)  Final    50,000 COLONIES/mL ENTEROCOCCUS FAECALIS 90,000 COLONIES/mL STREPTOCOCCUS GALLOLYTICUS SUSCEPTIBILITIES TO FOLLOW Performed at [facility], [place] [address], [place], [place] [address]    Report Status PENDING  Incomplete  Culture, blood (Routine X 2) w Reflex to ID Panel     Status: None (Preliminary result)   Collection Time: 10/28/17  7:00 AM  Result Value Ref Range Status   Specimen Description BLOOD LEFT HAND  Final   Special Requests IN PEDIATRIC BOTTLE Blood Culture adequate volume  Final   Culture   Final    NO GROWTH 2 DAYS Performed at [facility], [place] [address] [place], [place] [address]    Report Status PENDING  Incomplete  Culture, blood (Routine X 2) w Reflex to ID Panel     Status: None (Preliminary result)   Collection Time: 10/28/17  7:00 AM  Result Value Ref Range Status   Specimen Description BLOOD RIGHT ANTECUBITAL  Final   Special Requests   Final    BOTTLES DRAWN AEROBIC AND ANAEROBIC Blood Culture adequate volume   Culture   Final    NO GROWTH 2 DAYS Performed at [facility], [place] [address], [place], [place] [address]    Report Status PENDING  Incomplete     Studies: No results found.  Scheduled Meds: . aspirin  81 mg Oral Daily  . atorvastatin  80 mg Oral Daily  . dextromethorphan-guaiFENesin  1 tablet Oral BID  . docusate sodium  100 mg Oral BID  . fluconazole  100 mg Oral Daily  . fluticasone  1 spray Each Nare Daily  . furosemide  20 mg Oral Daily  . ipratropium-albuterol  3 mL Nebulization BID  . levofloxacin  500 mg Oral Daily  . levothyroxine  50 mcg Oral QAC breakfast  . loratadine  10 mg Oral Daily  . metoprolol tartrate  25 mg Oral BID  . nystatin   Topical TID  . pantoprazole  40 mg Oral BID  . polyethylene glycol  17 g Oral Daily  . potassium chloride  40 mEq Oral Once  . predniSONE  50 mg Oral Q breakfast  . protein supplement shake  11 oz Oral BID BM  . sodium  chloride flush  3 mL Intravenous Q12H  . venlafaxine XR  75 mg Oral Q breakfast   Continuous Infusions: . sodium chloride    . sodium chloride 50 mL/hr at 10/30/17 1608  . heparin 1,050 Units/hr (10/30/17 0827)    Principal Problem:   Chest pain Active Problems:   Essential hypertension   GERD   Abdominal pain  Pure hypercholesterolemia   History of CVA (cerebrovascular accident)   Hypothyroidism   Diastolic CHF ([place])   Cough   NSTEMI (non-ST elevated myocardial infarction) ([place])   Rash    Time spent: 30 minutes    [facility] Pager [phone] . If 7PM-7AM, please contact night-coverage at [email or web address], password [facility] 10/30/2017, 5:46 PM  LOS: 2 days

## 2017-10-30 NOTE — Progress Notes (Signed)
ANTICOAGULATION CONSULT NOTE - Follow Up Consult  Pharmacy Consult for heparin Indication: chest pain/ACS  Heparin Dosing Weight: 75kg  Assessment: See pharmacist note from earlier today for further assessment.  Following up on heparin level this evening.  12/1 1653 heparin level 0.53 - now therapeutic with infusion at 1050 units/hr.  Goal of Therapy:  Heparin level 0.3-0.7 units/ml Monitor platelets by anticoagulation protocol: Yes   Plan:    Continue heparin infusion at 1050 units/hr  Recheck heparin level in 8h to confirm rate  Daily heparin level and CBC  Monitor for bleeding  Hershal Coria, PharmD, BCPS Pager: [phone] 10/30/2017 5:13 PM

## 2017-10-30 NOTE — Progress Notes (Signed)
ANTICOAGULATION CONSULT NOTE - Follow Up Consult  Pharmacy Consult for heparin Indication: chest pain/ACS  Allergies  Allergen Reactions  . Lisinopril Cough  . Codeine Phosphate Itching  . Morphine And Related Itching    Burning sensation and itchiness with IV morphine    Patient Measurements: Height: 5\' 4"  (162.6 cm) Weight: 199 lb (90.3 kg) IBW/kg (Calculated) : 54.7 Heparin Dosing Weight: 75kg  Vital Signs: Temp: 97.9 F (36.6 C) (12/01 0522) Temp Source: Oral (12/01 0522) BP: 110/96 (12/01 0522) Pulse Rate: 66 (12/01 0522)  Labs: Recent Labs    10/28/17 0425 10/28/17 0700 10/28/17 1215  10/28/17 1524 10/28/17 1846 10/29/17 0304 10/29/17 0448 10/29/17 1209 10/29/17 1940 10/30/17 0529  HGB 12.0  --   --   --   --   --  10.8*  --   --   --  10.8*  HCT 36.4  --   --   --   --   --  33.0*  --   --   --  33.7*  PLT 339  --   --   --   --   --  321  --   --   --  330  LABPROT  --   --   --   --  15.0  --   --   --   --   --   --   INR  --   --   --   --  1.19  --   --   --   --   --   --   HEPARINUNFRC  --   --   --    < > 0.14*  --  0.75*  --  0.62 0.33 0.78*  CREATININE 0.87  --   --   --   --   --   --  1.12*  --   --   --   CKTOTAL 319*  --   --   --   --   --   --   --   --   --   --   CKMB 22.3*  --   --   --   --   --   --   --   --   --   --   TROPONINI  --  1.64* 1.49*  --   --  1.07*  --   --   --   --   --    < > = values in this interval not displayed.    Estimated Creatinine Clearance: 50.8 mL/min (A) (by C-G formula based on SCr of 1.12 mg/dL (H)).   Medical History: Past Medical History:  Diagnosis Date  . ABDOMINAL PAIN, RECURRENT 03/29/2008  . ANXIETY 10/07/2007  . Candidiasis of mouth 05/30/2010  . GERD 07/25/2007  . Hernia, hiatal   . HYPERTENSION 07/25/2007  . Stroke [facility])     Assessment: 13 YOF presents with shortness of breath.  Recent diagnosis of bronchitis.  In ED, troponin noted to be elevated w/ ST segment depression (per EDP  note).  Pharmacy asked to dose heparin gtt for rule out ACS.  No documented anticoagulant use prior to admission  Today, 10/30/2017  Heparin level this AM = 0.78 units/mL, now supratherapeutic on heparin infusion at 1150 units/hr  CBC: Hgb low but stable at 10.8, Pltc WNL  Renal: SCr slightly increased (11/30)  No bleeding issues or complications of therapy noted per nursing  Per cardiology, plan  for cardiac cath on Monday  Goal of Therapy:  Heparin level 0.3-0.7 units/ml Monitor platelets by anticoagulation protocol: Yes   Plan:    Decrease heparin infusion to 1050 units/hr.   Check heparin level 8 hours after rate change.   Daily heparin level and CBC.  Monitor for s/sx of bleeding   Lindell Spar, PharmD, BCPS Pager: [phone] 10/30/2017 7:47 AM

## 2017-10-30 NOTE — Progress Notes (Signed)
Progress Note  Patient Name: Erica Morris Date of Encounter: 10/30/2017  Primary Cardiologist: New- Dr Meda Coffee  Subjective   No chest pain, still coughing, non-productive.  Fairly comfortable in bed.  Inpatient Medications    Scheduled Meds: . aspirin  81 mg Oral Daily  . atorvastatin  80 mg Oral Daily  . dextromethorphan-guaiFENesin  1 tablet Oral BID  . docusate sodium  100 mg Oral BID  . fluconazole  100 mg Oral Daily  . fluticasone  1 spray Each Nare Daily  . furosemide  20 mg Oral Daily  . ipratropium-albuterol  3 mL Nebulization BID  . levofloxacin  500 mg Oral Daily  . levothyroxine  50 mcg Oral QAC breakfast  . loratadine  10 mg Oral Daily  . metoprolol tartrate  25 mg Oral BID  . nystatin   Topical TID  . pantoprazole  40 mg Oral BID  . polyethylene glycol  17 g Oral Daily  . potassium chloride  40 mEq Oral Once  . predniSONE  50 mg Oral Q breakfast  . protein supplement shake  11 oz Oral BID BM  . sodium chloride flush  3 mL Intravenous Q12H  . venlafaxine XR  75 mg Oral Q breakfast   Continuous Infusions: . sodium chloride    . sodium chloride 50 mL/hr at 10/29/17 2039  . heparin 1,050 Units/hr (10/30/17 0827)   PRN Meds: sodium chloride, acetaminophen, albuterol, ALPRAZolam, diphenhydrAMINE, hydrALAZINE, HYDROcodone-homatropine, iopamidol, morphine injection, nitroGLYCERIN, ondansetron, sodium chloride flush, zolpidem   Vital Signs    Vitals:   10/30/17 0142 10/30/17 0522 10/30/17 0831 10/30/17 0835  BP:  (!) 110/96    Pulse:  66    Resp:  20    Temp:  97.9 F (36.6 C)    TempSrc:  Oral    SpO2: 98% 96% 96% 96%  Weight:      Height:        Intake/Output Summary (Last 24 hours) at 10/30/2017 1020 Last data filed at 10/30/2017 0846 Gross per 24 hour  Intake 1473.55 ml  Output 200 ml  Net 1273.55 ml   Filed Weights   10/28/17 0502  Weight: 199 lb (90.3 kg)    Telemetry    Sinus rhythm, ST depression not as prominent as yesterday -  Personally Reviewed  ECG    Sinus rhythm no VT, rare PVC- Personally Reviewed  Physical Exam  GEN: Well nourished, well developed, in no acute distress  HEENT: normal  Neck: no JVD, carotid bruits, or masses Cardiac: RRR; no murmurs, rubs, or gallops,no edema excellent radial pulse Respiratory: Decreased breath sounds bilaterally, minimal wheeze GI: soft, nontender, nondistended, + BS MS: no deformity or atrophy  Skin: warm and dry, no rash Neuro:  Alert and Oriented x 3, Strength and sensation are intact Psych: euthymic mood, full affect   Labs    Chemistry Recent Labs  Lab 10/28/17 0425 10/28/17 0700 10/29/17 0448  NA 139  --  138  K 3.2*  --  3.2*  CL 105  --  105  CO2 24  --  25  GLUCOSE 127*  --  124*  BUN 10  --  20  CREATININE 0.87  --  1.12*  CALCIUM 8.8*  --  8.7*  PROT  --  7.0  --   ALBUMIN  --  3.5  --   AST  --  35  --   ALT  --  20  --   ALKPHOS  --  111  --   BILITOT  --  1.0  --   GFRNONAA >60  --  49*  GFRAA >60  --  56*  ANIONGAP 10  --  8     Hematology Recent Labs  Lab 10/28/17 0425 10/29/17 0304 10/30/17 0529  WBC 16.9* 15.1* 15.5*  RBC 4.52 4.03 4.08  HGB 12.0 10.8* 10.8*  HCT 36.4 33.0* 33.7*  MCV 80.5 81.9 82.6  MCH 26.5 26.8 26.5  MCHC 33.0 32.7 32.0  RDW 13.8 14.0 14.1  PLT 339 321 330    Cardiac Enzymes Recent Labs  Lab 10/28/17 0700 10/28/17 1215 10/28/17 1846  TROPONINI 1.64* 1.49* 1.07*    Recent Labs  Lab 10/28/17 0443  TROPIPOC 1.75*     BNP Recent Labs  Lab 10/28/17 0425  BNP 271.5*     DDimer No results for input(s): DDIMER in the last 168 hours.   Radiology    Ct Abdomen Pelvis W Contrast  Result Date: 10/28/2017 CLINICAL DATA:  Diffuse abdominal pain and fever. Clinical suspicion for abscess. EXAM: CT ABDOMEN AND PELVIS WITH CONTRAST TECHNIQUE: Multidetector CT imaging of the abdomen and pelvis was performed using the standard protocol following bolus administration of intravenous contrast.  CONTRAST:  173mL ISOVUE-300 IOPAMIDOL (ISOVUE-300) INJECTION 61% COMPARISON:  03/27/2008 FINDINGS: Lower Chest: Mild patchy airspace opacity is seen in the posterior right lower lobe, suspicious for pneumonia. Hepatobiliary: No hepatic masses identified. A few sub-cm calcified gallstones are seen, however there is no evidence of cholecystitis or biliary ductal dilatation. Pancreas:  No mass or inflammatory changes. Spleen: Within normal limits in size and appearance. Adrenals/Urinary Tract: Normal adrenal glands. Bilateral renal sinus cysts are again seen, without evidence of hydronephrosis. A new lesion is seen in the posterior lower pole of the right kidney which shows evidence of mild contrast washout delayed imaging, suspicious for a solid renal neoplasm. This measures 3.3 x 2.8 cm. No other renal masses are identified. Unremarkable unopacified urinary bladder. Stomach/Bowel: No evidence of obstruction, inflammatory process or abnormal fluid collections. Mild diverticulosis involving the proximal sigmoid colon, without evidence of diverticulitis. Vascular/Lymphatic: No pathologically enlarged lymph nodes. No abdominal aortic aneurysm. Aortic atherosclerosis. Reproductive: Prior hysterectomy noted. Adnexal regions are unremarkable in appearance. Other:  None. Musculoskeletal:  No suspicious bone lesions identified. IMPRESSION: No evidence of abdominal or pelvic abscess or acute inflammatory process. 3.3 cm lesion in lower pole of right kidney, suspicious for solid renal neoplasm. Recommend abdomen MRI without and with contrast for further characterization. No evidence of metastatic disease. Mild airspace opacity in posterior right lower lobe, suspicious for pneumonia. Cholelithiasis.  No radiographic evidence of cholecystitis. Colonic diverticulosis, without radiographic evidence of diverticulitis. Electronically Signed   By: Earle Gell M.D.   On: 10/28/2017 12:41    Cardiac Studies   Echocardiogram  10/28/17 ------------------------------------------------------------------- Study Conclusions  - Left ventricle: The cavity size was normal. Wall thickness was   increased in a pattern of mild LVH. Systolic function was normal.   The estimated ejection fraction was in the range of 60% to 65%. - Mitral valve: There was mild regurgitation. - Pulmonary arteries: PA peak pressure: 34 mm Hg (S).   Patient Profile     71 y.o. female with a hx of hypertension, hyperlipidemia, stroke, GERD, hypothyroidism, diastolic CHF  with non-ST elevation myocardial infarction, chest pain and elevated troponin. EKG with diffuse ST depression. Also has UTI and bronchitis.   Assessment & Plan    Non-STEMI -In setting of bronchtitis and UTI  with leukocytosis -Troponin on presentation 1.75, trended down to 1.07, continue heparin until heart catheterization -Mostly atypical pleuritic chest pain, but also has had some underlying typical exertional chest pressure with dyspnea over the past few months.  -EKG with new diffuse ST depression.  -IV heparin infusing. Plan for cardiac cath on Monday, hoping her bronchitis improves by then.  -No significant changes.  Chronic diastolic CHF -Presumptive with previous echo in 10/2016 showing EF 60-65%. Treated with diuretic, peripheral edema and shortness of breath.  -Echo done showed EF 60-65%, mild MR, PA peak pressure 34 mmHg, mild LVH -Pt appears euvolemic, doing well  Acute bronchitis -Frequent nonproductive cough. -CXR with no acute abnormality  -WBCs 16.9. Management per IM.  She is still coughing but I think this is improving.  UTI -Being treated with Levaquin per IM  Hypokalemia -replaced.   Hypertension - now well controlled.  Hyperlipidemia -LDL 176 in 10/2016, patient is on atorvastatin 80 mg daily -Updated lipid panel with LDL 94  Renal mass -CT interpretation recommended MRI.  There is concern for neoplasm on report.  Discussed with Dr.  Dyann Kief.  It would be nice to have this clarified prior to heart catheterization in case further surgical management is necessary.  For questions or updates, please contact [place] Please consult [email or web address] for contact info under Cardiology/STEMI.   Candee Furbish, MD 10/30/2017

## 2017-10-31 DIAGNOSIS — I200 Unstable angina: Secondary | ICD-10-CM

## 2017-10-31 LAB — BASIC METABOLIC PANEL
Anion gap: 4 — ABNORMAL LOW (ref 5–15)
BUN: 25 mg/dL — ABNORMAL HIGH (ref 6–20)
CO2: 27 mmol/L (ref 22–32)
Calcium: 8.6 mg/dL — ABNORMAL LOW (ref 8.9–10.3)
Chloride: 107 mmol/L (ref 101–111)
Creatinine, Ser: 0.83 mg/dL (ref 0.44–1.00)
GFR calc Af Amer: >60 mL/min (ref >60)
GFR calc non Af Amer: >60 mL/min (ref >60)
Glucose, Bld: 133 mg/dL — ABNORMAL HIGH (ref 65–99)
Potassium: 4.2 mmol/L (ref 3.5–5.1)
Sodium: 138 mmol/L (ref 135–145)

## 2017-10-31 LAB — CBC
HCT: 32.5 % — ABNORMAL LOW (ref 36.0–46.0)
Hemoglobin: 10.4 g/dL — ABNORMAL LOW (ref 12.0–15.0)
MCH: 26.4 pg (ref 26.0–34.0)
MCHC: 32 g/dL (ref 30.0–36.0)
MCV: 82.5 fL (ref 78.0–100.0)
Platelets: 284 10*3/uL (ref 150–400)
RBC: 3.94 MIL/uL (ref 3.87–5.11)
RDW: 13.8 % (ref 11.5–15.5)
WBC: 14.3 10*3/uL — ABNORMAL HIGH (ref 4.0–10.5)

## 2017-10-31 LAB — URINE CULTURE: Culture: 50000 — AB

## 2017-10-31 LAB — HEPARIN LEVEL (UNFRACTIONATED)
Heparin Unfractionated: 0.49 IU/mL (ref 0.30–0.70)
Heparin Unfractionated: 0.59 IU/mL (ref 0.30–0.70)

## 2017-10-31 MED ORDER — SODIUM CHLORIDE 0.9% FLUSH: 3.0000 mL | INTRAVENOUS | Status: DC | PRN

## 2017-10-31 MED ORDER — SODIUM CHLORIDE 0.9 % IV SOLN: 250.0000 mL | INTRAVENOUS | Status: DC | PRN

## 2017-10-31 MED ORDER — ASPIRIN 81 MG PO CHEW
81.0000 mg | CHEWABLE_TABLET | ORAL | Status: AC
  Administered 2017-11-01: 81 mg via ORAL
  Filled 2017-10-31: qty 1

## 2017-10-31 MED ORDER — SODIUM CHLORIDE 0.9% FLUSH
3.0000 mL | Freq: Two times a day (BID) | INTRAVENOUS | Status: DC
  Administered 2017-10-31 – 2017-11-02 (×3): 3 mL via INTRAVENOUS

## 2017-10-31 MED ORDER — SODIUM CHLORIDE 0.9 % WEIGHT BASED INFUSION
1.0000 mL/kg/h | INTRAVENOUS | Status: DC
  Administered 2017-11-01 – 2017-11-02 (×3): 1 mL/kg/h via INTRAVENOUS

## 2017-10-31 MED ORDER — SODIUM CHLORIDE 0.9 % WEIGHT BASED INFUSION: 3.0000 mL/kg/h | INTRAVENOUS | Status: AC

## 2017-10-31 NOTE — Progress Notes (Signed)
[facility] PROGRESS NOTE  Erica Morris WUJ:[id number] DOB: May 22, 1946 DOA: 10/28/2017 PCP: Zenia Resides, MD  Interim summary and history of present illness 71 y.o. female with medical history significant of hypertension, hyperlipidemia, stroke, GERD, hypothyroidism, dCHF, who presents with cough, shortness breath, chest pain, abdominal pain, rashes. Patient states that she has been having dry cough and shortness of breath for almost 3 weeks, which has worsened in the past several days. Patient also has pleuritic chest pain. The chest pain is located in the left side of chest, constant, sharp, 10 out of 10 in severity, aggravated by coughing. Patient has chills, but no fever. Patient states that she has nausea, but no vomiting or diarrhea. She has abdominal pain, which is located in the central abdomen, constant, 10 out of 10 in severity, sharp. Her abdominal pain has been going on for about 1 week. Patient states she has mild dysuria, but no burning on urination or increased urinary frequency. Patient also has rashes in right leg and neck area, which has been going on for almost a month. Pt was seen by PCP on 11/27 for cough and SOB, was started with azithromycin and prednisone for possible bronchitis, without significant improvement.   Assessment/Plan: 1-chest pain: With obesity, hypertension, hyperlipidemia as risk factors for ACS. -She has not had any stratification in the past -Troponins elevated with some diffuse ST depression changes at the moment of admission.   meeting criteria for NSTEMI -2D echo given reassurance: Preserved ejection fraction no wall motion abnormalities. -After discussing with cardiology plan is to pursued left heart cath on 12/3; for now continue medical management and hip pain. -Continue aspirin and statins.  2-chronic diastolic heart failure: Compensated. -No abnormality seen or describe on current echo regarding diastolic dysfunction. -Preserved  ejection fraction appreciated. -Appears to be compensated at this moment -Follow daily weights and Strict intake and output -Continue heart healthy diet. -Continue current dose of Lasix.  3-acute bronchitis in the setting of chronic COPD -Patient is currently no wheezing or complaining of significant shortness of breath -continue steroids tapering, continue current antibiotics, continue antitussives and as needed nebulizer.    4-UTI: With Enterococcus faecalis and a streptococcal Gllolyticus isolated -Sensitive to Levaquin -continue treatment with Levaquin, but switch to p.o. -Patient denies dysuria.  5-hypokalemia -K 4.2 -will monitor electrolytes and further replete as needed.  6-hypertension -Blood pressure has remained stable -Continue current antihypertensive regimen -Follow vital signs and further adjust medications as needed based on blood pressure control.    7-hyperlipidemia -LDL 94 -Continue Lipitor  8-morbid obesity: class 2 -Body mass index is 34.17 kg/m. -low calorie diet and increase physical activity has been discussed with patient  9-bilateral groin fungal dermatitis. -Continue keeping area clean and dry -Continue nystatin powder -Complete treatment with oral Diflucan (patient on day 4 out of 5).    10-GERD -Continue PPI.   -Patient denies reflux symptoms.  11-allergic rhinitis/nasal congestion and postnasal drip -Continue Flonase and loratadine -Patient is still demonstrating signs of nasal congestion.  12-Renal mass -Given concerns on CT scan for potential renal carcinoma; dedicated MRI has been ordered since 10/30/17 and is still pending.    Code Status: Full code Family Communication: No family at bedside Disposition Plan: Remains inpatient, continue treatment with steroids, antibiotics, antitussives, and medications associated with nasal congestion/postnasal drip (Flonase and loratadine).  Currently chest pain-free and with plans for cardiac cath  most likely tomorrow Monday (11/01/17).   Consultants:  Cardiology service  Procedures:  2D echo -  Left ventricle: The cavity size was normal. Wall thickness was   increased in a pattern of mild LVH. Systolic function was normal.   The estimated ejection fraction was in the range of 60% to 65%. - Mitral valve: There was mild regurgitation. - Pulmonary arteries: PA peak pressure: 34 mm Hg (S).  Antibiotics:  Levaquin 11/29>>>  HPI/Subjective: No chest pain, no shortness of breath, no fever, no nausea, no vomiting.  Patient denies dysuria.  Still complaining of coughing spells.  Objective: Vitals:   10/31/17 0639 10/31/17 1348  BP:  (!) 146/70  Pulse:  62  Resp:  18  Temp:  98 F (36.7 C)  SpO2: 96% 97%    Intake/Output Summary (Last 24 hours) at 10/31/2017 1631 Last data filed at 10/31/2017 1349 Gross per 24 hour  Intake 1627.5 ml  Output -  Net 1627.5 ml   Filed Weights   10/28/17 0502 10/31/17 1400  Weight: 90.3 kg (199 lb) 90.3 kg (199 lb 1.2 oz)    Exam:   General: No fever, patient continued to experience intermittent prolonged episode of coughing spells.  She is less nasally congested and reports no chest pain or feeling persistently short of breath at this moment.    Cardiovascular: S1 and S2, no rubs, no gallops, no murmurs, no JVD.    Respiratory: Overall with good air movement, no wheezing, no crackles, no using accessory muscles. Scattered rhonchi appreciated.    Abdomen: Obese, soft nontender, nondistended, positive bowel sounds, no guarding.   Musculoskeletal: No edema, no cyanosis or clubbing.    Skin: Patient skin demonstrated continued improvement in her fungal dermatitis lesions and also in the lesions in her right lower extremities (from skin abrasion/scabs by scratching).    Data Reviewed: Basic Metabolic Panel: Recent Labs  Lab 10/28/17 0425 10/28/17 0700 10/29/17 0448 10/30/17 2356  NA 139  --  138 138  K 3.2*  --  3.2* 4.2  CL  105  --  105 107  CO2 24  --  25 27  GLUCOSE 127*  --  124* 133*  BUN 10  --  20 25*  CREATININE 0.87  --  1.12* 0.83  CALCIUM 8.8*  --  8.7* 8.6*  MG  --  1.8  --   --    Liver Function Tests: Recent Labs  Lab 10/28/17 0700  AST 35  ALT 20  ALKPHOS 111  BILITOT 1.0  PROT 7.0  ALBUMIN 3.5   Recent Labs  Lab 10/28/17 0700  LIPASE 17   CBC: Recent Labs  Lab 10/28/17 0425 10/29/17 0304 10/30/17 0529 10/30/17 2356  WBC 16.9* 15.1* 15.5* 14.3*  NEUTROABS 14.1*  --   --   --   HGB 12.0 10.8* 10.8* 10.4*  HCT 36.4 33.0* 33.7* 32.5*  MCV 80.5 81.9 82.6 82.5  PLT 339 321 330 284   Cardiac Enzymes: Recent Labs  Lab 10/28/17 0425 10/28/17 0700 10/28/17 1215 10/28/17 1846  CKTOTAL 319*  --   --   --   CKMB 22.3*  --   --   --   TROPONINI  --  1.64* 1.49* 1.07*   BNP (last 3 results) Recent Labs    10/03/17 2104 10/28/17 0425  BNP 86.5 271.5*    Recent Results (from the past 240 hour(s))  Respiratory Panel by PCR     Status: None   Collection Time: 10/28/17  6:47 AM  Result Value Ref Range Status   Adenovirus NOT DETECTED NOT DETECTED Final  Coronavirus 229E NOT DETECTED NOT DETECTED Final   Coronavirus HKU1 NOT DETECTED NOT DETECTED Final   Coronavirus NL63 NOT DETECTED NOT DETECTED Final   Coronavirus OC43 NOT DETECTED NOT DETECTED Final   Metapneumovirus NOT DETECTED NOT DETECTED Final   Rhinovirus / Enterovirus NOT DETECTED NOT DETECTED Final   Influenza A NOT DETECTED NOT DETECTED Final   Influenza B NOT DETECTED NOT DETECTED Final   Parainfluenza Virus 1 NOT DETECTED NOT DETECTED Final   Parainfluenza Virus 2 NOT DETECTED NOT DETECTED Final   Parainfluenza Virus 3 NOT DETECTED NOT DETECTED Final   Parainfluenza Virus 4 NOT DETECTED NOT DETECTED Final   Respiratory Syncytial Virus NOT DETECTED NOT DETECTED Final   Bordetella pertussis NOT DETECTED NOT DETECTED Final   Chlamydophila pneumoniae NOT DETECTED NOT DETECTED Final   Mycoplasma pneumoniae  NOT DETECTED NOT DETECTED Final    Comment: Performed at [facility], [place] [address] [place], [place] [address]  Urine Culture     Status: Abnormal   Collection Time: 10/28/17  6:50 AM  Result Value Ref Range Status   Specimen Description URINE, CLEAN CATCH  Final   Special Requests NONE  Final   Culture (A)  Final    50,000 COLONIES/mL ENTEROCOCCUS FAECALIS 90,000 COLONIES/mL STREPTOCOCCUS GALLOLYTICUS    Report Status 10/31/2017 FINAL  Final   Organism ID, Bacteria ENTEROCOCCUS FAECALIS (A)  Final   Organism ID, Bacteria STREPTOCOCCUS GALLOLYTICUS (A)  Final      Susceptibility   Enterococcus faecalis - MIC*    AMPICILLIN <=2 SENSITIVE Sensitive     LEVOFLOXACIN 1 SENSITIVE Sensitive     NITROFURANTOIN <=16 SENSITIVE Sensitive     VANCOMYCIN 2 SENSITIVE Sensitive     * 50,000 COLONIES/mL ENTEROCOCCUS FAECALIS   Streptococcus gallolyticus - MIC*    PENICILLIN 0.12 SENSITIVE Sensitive     CEFTRIAXONE 0.25 SENSITIVE Sensitive     ERYTHROMYCIN >=8 RESISTANT Resistant     LEVOFLOXACIN 2 SENSITIVE Sensitive     VANCOMYCIN 0.5 SENSITIVE Sensitive     * 90,000 COLONIES/mL STREPTOCOCCUS GALLOLYTICUS  Culture, blood (Routine X 2) w Reflex to ID Panel     Status: None (Preliminary result)   Collection Time: 10/28/17  7:00 AM  Result Value Ref Range Status   Specimen Description BLOOD LEFT HAND  Final   Special Requests IN PEDIATRIC BOTTLE Blood Culture adequate volume  Final   Culture   Final    NO GROWTH 3 DAYS Performed at [facility], [address], [place], [place] [address]    Report Status PENDING  Incomplete  Culture, blood (Routine X 2) w Reflex to ID Panel     Status: None (Preliminary result)   Collection Time: 10/28/17  7:00 AM  Result Value Ref Range Status   Specimen Description BLOOD RIGHT ANTECUBITAL  Final   Special Requests   Final    BOTTLES DRAWN AEROBIC AND ANAEROBIC Blood Culture adequate volume   Culture   Final    NO GROWTH 3 DAYS Performed  at [facility], [place] [address], [place], [place] [address]    Report Status PENDING  Incomplete     Studies: No results found.  Scheduled Meds: . aspirin  81 mg Oral Daily  . [START ON 11/01/2017] aspirin  81 mg Oral Pre-Cath  . atorvastatin  80 mg Oral Daily  . benzonatate  200 mg Oral TID  . dextromethorphan-guaiFENesin  1 tablet Oral BID  . docusate sodium  100  mg Oral BID  . fluconazole  100 mg Oral Daily  . fluticasone  1 spray Each Nare Daily  . furosemide  20 mg Oral Daily  . ipratropium-albuterol  3 mL Nebulization BID  . levofloxacin  500 mg Oral Daily  . levothyroxine  50 mcg Oral QAC breakfast  . loratadine  10 mg Oral Daily  . metoprolol tartrate  25 mg Oral BID  . nystatin   Topical TID  . pantoprazole  40 mg Oral BID  . polyethylene glycol  17 g Oral Daily  . potassium chloride  40 mEq Oral Once  . predniSONE  40 mg Oral Q breakfast  . protein supplement shake  11 oz Oral BID BM  . sodium chloride flush  3 mL Intravenous Q12H  . sodium chloride flush  3 mL Intravenous Q12H  . venlafaxine XR  75 mg Oral Q breakfast   Continuous Infusions: . sodium chloride    . sodium chloride    . sodium chloride 50 mL/hr at 10/31/17 1159  . [START ON 11/01/2017] sodium chloride     Followed by  . [START ON 11/01/2017] sodium chloride    . heparin 1,050 Units/hr (10/30/17 2030)    Principal Problem:   Chest pain Active Problems:   Essential hypertension   GERD   Abdominal pain   Pure hypercholesterolemia   History of CVA (cerebrovascular accident)   Hypothyroidism   Diastolic CHF ([place])   Cough   NSTEMI (non-ST elevated myocardial infarction) ([place])   Rash    Time spent: 30 minutes    [facility] Pager [phone] . If 7PM-7AM, please contact night-coverage at [email or web address], password [facility] 10/31/2017, 4:31 PM  LOS: 3 days

## 2017-10-31 NOTE — Progress Notes (Signed)
Progress Note  Patient Name: Erica Morris Date of Encounter: 10/31/2017  Primary Cardiologist: New- Dr Meda Coffee  Subjective   Denies any further chest discomfort but her cough is worse this morning.  Incessant.  Inpatient Medications    Scheduled Meds: . aspirin  81 mg Oral Daily  . atorvastatin  80 mg Oral Daily  . benzonatate  200 mg Oral TID  . dextromethorphan-guaiFENesin  1 tablet Oral BID  . docusate sodium  100 mg Oral BID  . fluconazole  100 mg Oral Daily  . fluticasone  1 spray Each Nare Daily  . furosemide  20 mg Oral Daily  . ipratropium-albuterol  3 mL Nebulization BID  . levofloxacin  500 mg Oral Daily  . levothyroxine  50 mcg Oral QAC breakfast  . loratadine  10 mg Oral Daily  . metoprolol tartrate  25 mg Oral BID  . nystatin   Topical TID  . pantoprazole  40 mg Oral BID  . polyethylene glycol  17 g Oral Daily  . potassium chloride  40 mEq Oral Once  . predniSONE  40 mg Oral Q breakfast  . protein supplement shake  11 oz Oral BID BM  . sodium chloride flush  3 mL Intravenous Q12H  . venlafaxine XR  75 mg Oral Q breakfast   Continuous Infusions: . sodium chloride    . sodium chloride 50 mL/hr at 10/30/17 1608  . heparin 1,050 Units/hr (10/30/17 2030)   PRN Meds: sodium chloride, acetaminophen, albuterol, ALPRAZolam, diphenhydrAMINE, hydrALAZINE, HYDROcodone-homatropine, iopamidol, morphine injection, nitroGLYCERIN, ondansetron, sodium chloride flush, zolpidem   Vital Signs    Vitals:   10/30/17 2120 10/30/17 2132 10/31/17 0551 10/31/17 0639  BP:  (!) 169/78 (!) 179/100   Pulse:  64 (!) 59   Resp:  20 17   Temp:  98.1 F (36.7 C) 98.6 F (37 C)   TempSrc:  Oral Oral   SpO2: 96% 98% 100% 96%  Weight:      Height:        Intake/Output Summary (Last 24 hours) at 10/31/2017 0906 Last data filed at 10/31/2017 0600 Gross per 24 hour  Intake 1906.28 ml  Output -  Net 1906.28 ml   Filed Weights   10/28/17 0502  Weight: 199 lb (90.3 kg)     Telemetry    Sinus rhythm, ST depression not as prominent as yesterday.  Telemetry is not available today.- Personally Reviewed  ECG    Sinus rhythm no VT, rare PVC- Personally Reviewed  Physical Exam  GEN: Well nourished, well developed, in no acute distress  HEENT: normal  Neck: no JVD, carotid bruits, or masses Cardiac: RRR; no murmurs, rubs, or gallops,no edema  Respiratory: Right posterior lung wheezes heard on expiration.  Fairly incessant cough. GI: soft, nontender, nondistended, + BS MS: no deformity or atrophy  Skin: warm and dry, no rash Neuro:  Alert and Oriented x 3, Strength and sensation are intact Psych: euthymic mood, full affect    Labs    Chemistry Recent Labs  Lab 10/28/17 0425 10/28/17 0700 10/29/17 0448 10/30/17 2356  NA 139  --  138 138  K 3.2*  --  3.2* 4.2  CL 105  --  105 107  CO2 24  --  25 27  GLUCOSE 127*  --  124* 133*  BUN 10  --  20 25*  CREATININE 0.87  --  1.12* 0.83  CALCIUM 8.8*  --  8.7* 8.6*  PROT  --  7.0  --   --  ALBUMIN  --  3.5  --   --   AST  --  35  --   --   ALT  --  20  --   --   ALKPHOS  --  111  --   --   BILITOT  --  1.0  --   --   GFRNONAA >60  --  49* >60  GFRAA >60  --  56* >60  ANIONGAP 10  --  8 4*     Hematology Recent Labs  Lab 10/29/17 0304 10/30/17 0529 10/30/17 2356  WBC 15.1* 15.5* 14.3*  RBC 4.03 4.08 3.94  HGB 10.8* 10.8* 10.4*  HCT 33.0* 33.7* 32.5*  MCV 81.9 82.6 82.5  MCH 26.8 26.5 26.4  MCHC 32.7 32.0 32.0  RDW 14.0 14.1 13.8  PLT 321 330 284    Cardiac Enzymes Recent Labs  Lab 10/28/17 0700 10/28/17 1215 10/28/17 1846  TROPONINI 1.64* 1.49* 1.07*    Recent Labs  Lab 10/28/17 0443  TROPIPOC 1.75*     BNP Recent Labs  Lab 10/28/17 0425  BNP 271.5*     DDimer No results for input(s): DDIMER in the last 168 hours.   Radiology    No results found.  Cardiac Studies   Echocardiogram  10/28/17 ------------------------------------------------------------------- Study Conclusions  - Left ventricle: The cavity size was normal. Wall thickness was   increased in a pattern of mild LVH. Systolic function was normal.   The estimated ejection fraction was in the range of 60% to 65%. - Mitral valve: There was mild regurgitation. - Pulmonary arteries: PA peak pressure: 34 mm Hg (S).   Patient Profile     71 y.o. female with a hx of hypertension, hyperlipidemia, stroke, GERD, hypothyroidism, diastolic CHF  with non-ST elevation myocardial infarction, chest pain and elevated troponin. EKG with diffuse ST depression. Also has UTI and bronchitis.   Assessment & Plan    Non-STEMI -In setting of bronchtitis and UTI with leukocytosis -Troponin on presentation 1.75, trended down to 1.07, continue heparin until heart catheterization -Mostly atypical pleuritic chest pain, but also has had some underlying typical exertional chest pressure with dyspnea over the past few months.  -EKG with new diffuse ST depression.  -IV heparin infusing. Plan for cardiac cath on Monday, hoping her bronchitis improves by then.  -When I went into her room this morning, she was coughing fairly incessantly.  Wheeze heard especially in right posterior lung.  If her cough does not improve, she will likely not be able to lay flat or still for cardiac catheterization.  Continue to work on cough.  Chronic diastolic CHF -Presumptive with previous echo in 10/2016 showing EF 60-65%. Treated with diuretic, peripheral edema and shortness of breath.  -Echo done showed EF 60-65%, mild MR, PA peak pressure 34 mmHg, mild LVH -Pt appears euvolemic, doing well, continuing with low-dose Lasix 20 mg once a day creatinine 0.83  Acute bronchitis/right lower lobe infiltrate pneumonia seen on abdominal CT -Frequent nonproductive cough. -CXR with no acute abnormality  -WBCs 16.9 down to 14.3. Management per IM.  Currently cough  is still quite incessant.    UTI -Being treated with Levaquin per IM.  Hypokalemia -replaced.  Stable  Hypertension -Elevated but she was in the midst of coughing  Hyperlipidemia -LDL 176 in 10/2016, patient is on atorvastatin 80 mg daily -Updated lipid panel with LDL 94, improved  Renal mass -CT interpretation recommended MRI.  There is concern for neoplasm on report.  Discussed with  Dr. Dyann Kief.  It would be nice to have this clarified prior to heart catheterization in case further surgical management is necessary.  Per Dr. Anson Fret note, an MRI has been ordered.  For questions or updates, please contact [place] Please consult [email or web address] for contact info under Cardiology/STEMI.   Candee Furbish, MD 10/31/2017
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
## 2017-10-31 NOTE — Progress Notes (Signed)
ANTICOAGULATION CONSULT NOTE - Follow Up Consult  Pharmacy Consult for heparin Indication: chest pain/ACS  Heparin Dosing Weight: 75kg  Assessment: See pharmacist note from earlier today for further assessment.  Following up on heparin level this evening.  12/1 1653 heparin level 0.53  12/1 2356 heparin level 0.49- now therapeutic x 2  Goal of Therapy:  Heparin level 0.3-0.7 units/ml Monitor platelets by anticoagulation protocol: Yes   Plan:    Continue heparin infusion at 1050 units/hr  Daily heparin level and CBC  Monitor for bleeding   Dorrene German 10/31/2017 12:52 AM

## 2017-10-31 NOTE — Progress Notes (Signed)
ANTICOAGULATION CONSULT NOTE - Follow Up Consult  Pharmacy Consult for heparin Indication: chest pain/ACS  Allergies  Allergen Reactions  . Lisinopril Cough  . Codeine Phosphate Itching  . Morphine And Related Itching    Burning sensation and itchiness with IV morphine    Patient Measurements: Height: 5\' 4"  (162.6 cm) Weight: 199 lb (90.3 kg) IBW/kg (Calculated) : 54.7 Heparin Dosing Weight: 75kg  Vital Signs: Temp: 98.6 F (37 C) (12/02 0551) Temp Source: Oral (12/02 0551) BP: 179/100 (12/02 0551) Pulse Rate: 59 (12/02 0551)  Labs: Recent Labs    10/28/17 1524 10/28/17 1846  10/29/17 0304 10/29/17 0448  10/30/17 0529 10/30/17 1623 10/30/17 2356 10/31/17 1040  HGB  --   --    < > 10.8*  --   --  10.8*  --  10.4*  --   HCT  --   --   --  33.0*  --   --  33.7*  --  32.5*  --   PLT  --   --   --  321  --   --  330  --  284  --   LABPROT 15.0  --   --   --   --   --   --   --   --   --   INR 1.19  --   --   --   --   --   --   --   --   --   HEPARINUNFRC 0.14*  --   --  0.75*  --    < > 0.78* 0.53 0.49 0.59  CREATININE  --   --   --   --  1.12*  --   --   --  0.83  --   TROPONINI  --  1.07*  --   --   --   --   --   --   --   --    < > = values in this interval not displayed.    Estimated Creatinine Clearance: 68.6 mL/min (by C-G formula based on SCr of 0.83 mg/dL).   Medical History: Past Medical History:  Diagnosis Date  . ABDOMINAL PAIN, RECURRENT 03/29/2008  . ANXIETY 10/07/2007  . Candidiasis of mouth 05/30/2010  . GERD 07/25/2007  . Hernia, hiatal   . HYPERTENSION 07/25/2007  . Stroke [facility])     Assessment: Erica Morris presents with shortness of breath.  Recent diagnosis of bronchitis.  In ED, troponin noted to be elevated w/ ST segment depression (per EDP note).  Pharmacy asked to dose heparin gtt for NSTEMI.  Cath planned for Monday.  No documented anticoagulant use prior to admission  Today, 10/31/2017  Heparin level this AM = 0.59 units/mL, therapeutic  with heparin infusion at 1050 units/hr  Daily CBC obtained near midnight last night: Hgb low but stable, Pltc WNL.   Renal: SCr returned to baseline  No bleeding issues or complications of therapy noted per nursing  Goal of Therapy:  Heparin level 0.3-0.7 units/ml Monitor platelets by anticoagulation protocol: Yes   Plan:    Continue heparin infusion at 1050 units/hr.   Daily heparin level and CBC.  Monitor for s/sx of bleeding  Hershal Coria, PharmD, BCPS Pager: [phone] 10/31/2017 12:28 PM

## 2017-11-01 ENCOUNTER — Inpatient Hospital Stay (HOSPITAL_COMMUNITY): Payer: Medicare Other

## 2017-11-01 DIAGNOSIS — J209 Acute bronchitis, unspecified: Secondary | ICD-10-CM

## 2017-11-01 LAB — CBC
HCT: 35.7 % — ABNORMAL LOW (ref 36.0–46.0)
Hemoglobin: 11.5 g/dL — ABNORMAL LOW (ref 12.0–15.0)
MCH: 26.3 pg (ref 26.0–34.0)
MCHC: 32.2 g/dL (ref 30.0–36.0)
MCV: 81.7 fL (ref 78.0–100.0)
Platelets: 355 10*3/uL (ref 150–400)
RBC: 4.37 MIL/uL (ref 3.87–5.11)
RDW: 13.7 % (ref 11.5–15.5)
WBC: 15.6 10*3/uL — ABNORMAL HIGH (ref 4.0–10.5)

## 2017-11-01 LAB — PROTIME-INR
INR: 1.09
Prothrombin Time: 14 seconds (ref 11.4–15.2)

## 2017-11-01 LAB — HEPARIN LEVEL (UNFRACTIONATED): Heparin Unfractionated: 0.56 IU/mL (ref 0.30–0.70)

## 2017-11-01 MED ORDER — METOPROLOL TARTRATE 25 MG PO TABS
12.5000 mg | ORAL_TABLET | Freq: Two times a day (BID) | ORAL | Status: DC
  Administered 2017-11-01 – 2017-11-03 (×4): 12.5 mg via ORAL
  Filled 2017-11-01 (×4): qty 1

## 2017-11-01 MED ORDER — FLUTICASONE PROPIONATE 50 MCG/ACT NA SUSP
1.0000 | Freq: Two times a day (BID) | NASAL | Status: DC
  Administered 2017-11-01 – 2017-11-03 (×4): 1 via NASAL

## 2017-11-01 MED ORDER — AMLODIPINE BESYLATE 5 MG PO TABS
5.0000 mg | ORAL_TABLET | Freq: Every day | ORAL | Status: DC
  Administered 2017-11-01: 5 mg via ORAL
  Filled 2017-11-01 (×2): qty 1

## 2017-11-01 MED ORDER — GADOBENATE DIMEGLUMINE 529 MG/ML IV SOLN
20.0000 mL | Freq: Once | INTRAVENOUS | Status: AC | PRN
  Administered 2017-11-01: 18 mL via INTRAVENOUS

## 2017-11-01 NOTE — Care Management Important Message (Signed)
Important Message  Patient Details  Name: Erica Morris MRN: [id number] Date of Birth: 14-Oct-1946   Medicare Important Message Given:  Yes    Kerin Salen 11/01/2017, 11:38 AMImportant Message  Patient Details  Name: Erica Morris MRN: [id number] Date of Birth: [birth year]   Medicare Important Message Given:  Yes    Kerin Salen 11/01/2017, 11:38 AM
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
## 2017-11-01 NOTE — Progress Notes (Signed)
Progress Note  Patient Name: Erica Morris Date of Encounter: 11/01/2017  Primary Cardiologist: New- Dr. Meda Coffee  Subjective   Pt appears better than on Friday with less coughing, but she says that she coughs more when she lays flat and had coughing last night when trying to sleep.   Inpatient Medications    Scheduled Meds: . aspirin  81 mg Oral Daily  . atorvastatin  80 mg Oral Daily  . benzonatate  200 mg Oral TID  . dextromethorphan-guaiFENesin  1 tablet Oral BID  . docusate sodium  100 mg Oral BID  . fluconazole  100 mg Oral Daily  . fluticasone  1 spray Each Nare Daily  . furosemide  20 mg Oral Daily  . levofloxacin  500 mg Oral Daily  . levothyroxine  50 mcg Oral QAC breakfast  . loratadine  10 mg Oral Daily  . metoprolol tartrate  25 mg Oral BID  . nystatin   Topical TID  . pantoprazole  40 mg Oral BID  . polyethylene glycol  17 g Oral Daily  . potassium chloride  40 mEq Oral Once  . predniSONE  40 mg Oral Q breakfast  . protein supplement shake  11 oz Oral BID BM  . sodium chloride flush  3 mL Intravenous Q12H  . venlafaxine XR  75 mg Oral Q breakfast   Continuous Infusions: . sodium chloride    . sodium chloride 1 mL/kg/hr (11/01/17 0536)  . heparin 1,050 Units/hr (10/31/17 2101)   PRN Meds: sodium chloride, acetaminophen, albuterol, ALPRAZolam, diphenhydrAMINE, hydrALAZINE, HYDROcodone-homatropine, iopamidol, morphine injection, nitroGLYCERIN, ondansetron, sodium chloride flush, zolpidem   Vital Signs    Vitals:   10/31/17 2108 11/01/17 0445 11/01/17 0819 11/01/17 0843  BP: (!) 168/83 (!) 177/77 (!) 151/69   Pulse: 63 (!) 53 (!) 54   Resp: 20 18    Temp: 97.8 F (36.6 C) 98.1 F (36.7 C)    TempSrc: Oral Oral    SpO2: 98% 98%  95%  Weight:      Height:        Intake/Output Summary (Last 24 hours) at 11/01/2017 0900 Last data filed at 11/01/2017 5681 Gross per 24 hour  Intake 1828.95 ml  Output -  Net 1828.95 ml   Filed Weights   10/28/17  0502 10/31/17 1400  Weight: 199 lb (90.3 kg) 199 lb 1.2 oz (90.3 kg)    Telemetry    Sinus bradycardia in the 50's, this am had 3 beats of NSVT followed by 8 beats of NCT - Personally Reviewed  ECG    No new tracings - Personally Reviewed  Physical Exam   GEN: No acute distress.   Neck: No JVD Cardiac: RRR, no murmurs, rubs, or gallops.  Respiratory: Clear to auscultation bilaterally. Occ non-productive cough. GI: Soft, nontender, non-distended  MS: No edema; No deformity. Neuro:  Nonfocal  Psych: Normal affect   Labs    Chemistry Recent Labs  Lab 10/28/17 0425 10/28/17 0700 10/29/17 0448 10/30/17 2356  NA 139  --  138 138  K 3.2*  --  3.2* 4.2  CL 105  --  105 107  CO2 24  --  25 27  GLUCOSE 127*  --  124* 133*  BUN 10  --  20 25*  CREATININE 0.87  --  1.12* 0.83  CALCIUM 8.8*  --  8.7* 8.6*  PROT  --  7.0  --   --   ALBUMIN  --  3.5  --   --  AST  --  35  --   --   ALT  --  20  --   --   ALKPHOS  --  111  --   --   BILITOT  --  1.0  --   --   GFRNONAA >60  --  49* >60  GFRAA >60  --  56* >60  ANIONGAP 10  --  8 4*     Hematology Recent Labs  Lab 10/30/17 0529 10/30/17 2356 11/01/17 0419  WBC 15.5* 14.3* 15.6*  RBC 4.08 3.94 4.37  HGB 10.8* 10.4* 11.5*  HCT 33.7* 32.5* 35.7*  MCV 82.6 82.5 81.7  MCH 26.5 26.4 26.3  MCHC 32.0 32.0 32.2  RDW 14.1 13.8 13.7  PLT 330 284 355    Cardiac Enzymes Recent Labs  Lab 10/28/17 0700 10/28/17 1215 10/28/17 1846  TROPONINI 1.64* 1.49* 1.07*    Recent Labs  Lab 10/28/17 0443  TROPIPOC 1.75*     BNP Recent Labs  Lab 10/28/17 0425  BNP 271.5*     DDimer No results for input(s): DDIMER in the last 168 hours.   Radiology    No results found.  Cardiac Studies   Echocardiogram 10/28/17 Study Conclusions  - Left ventricle: The cavity size was normal. Wall thickness was increased in a pattern of mild LVH. Systolic function was normal. The estimated ejection fraction was in the range  of 60% to 65%. - Mitral valve: There was mild regurgitation. - Pulmonary arteries: PA peak pressure: 34 mm Hg (S).  Patient Profile     71 y.o. female with a history of HTN, hyperlipidemia, stroke, GERD, hypothyroidism, diastolic CHF with non-ST elevation myocardial infarction, chest pain and elevated troponin. EKG with diffuse ST depression. Also has UTI and bronchitis.   Assessment & Plan    Non-STEMI -In setting of bronchitis and UTI with leukocytosis -Troponin on presentation 1.75, trended down to 1.07, continue heparin until heart catheterization -Mostly atypical pleuritic chest pain, but also has had some underlying typical exertional chest pressure with dyspnea over the past few months.  -EKG with new diffuse ST depression on presentation -IV heparin infusing. Planned for cardiac cath. Have been unable to perform cardiac cath due to significant coughing. She is receiving cough suppressants, but still has significant non-productive coughing worse when she lays flat. -The patient did not have significant typical chest pain on admission. Will discuss continued treatment of bronchitis and allowing pt to convalesce at home and bring in for outpatient cath once she is fully recovered. She has had well over 48 hrs of heparin-should be able to stop.   Chronic diastolic CHF -Presumptive with previous Echo 10/2016 showing EF 60-65%. Treated with diuretic as outpatient for peripheral edema and shortness of breath.  -Echo done on 10/28/17 showed EF 60-65%, mild MR, PA peak pressure 34 mmHg, mild LVH -Volume status is stable. -Continuing on lasix 20 mg daily. Wt stable.   Sinus bradycardia -Heart rates in the 50's, Had some brief NSVT and NCT this am.  -Will decrease BB.   Acute bronchitis/right lower lobe infiltrate PNA seen on abdominal CT -Frequent non-productive cough, improved since I last saw on Friday, but still present. -WBC 15.6 today. Management per IM  UTI -Being treated with  Levaquin per IM  Hypertension -Blood pressures still elevated.  -Will add low dose amlodipine, she had previous cough with ACE-I  Hypokalemia -Replaced, currently stable  Hyperlipidemia -LDL 176 in 10/2016,patient is on atorvastatin 80 mg daily -Updated lipid panel  with LDL 94, improved  Renal mass -CT interpretation recommended MRI.  There is concern for neoplasm on report.  Discussed with Dr. Dyann Kief.  It would be nice to have this clarified prior to heart catheterization in case further surgical management is necessary.  Per Dr. Anson Fret note, an MRI has been ordered.    For questions or updates, please contact [place] Please consult [email or web address] for contact info under Cardiology/STEMI.      Signed, Daune Perch, NP  11/01/2017, 9:00 AM

## 2017-11-01 NOTE — Progress Notes (Addendum)
ANTICOAGULATION CONSULT NOTE - Follow Up Consult  Pharmacy Consult for heparin Indication: chest pain/ACS  Allergies  Allergen Reactions  . Lisinopril Cough  . Codeine Phosphate Itching  . Morphine And Related Itching    Burning sensation and itchiness with IV morphine   Patient Measurements: Height: 5\' 4"  (162.6 cm) Weight: 199 lb 1.2 oz (90.3 kg) IBW/kg (Calculated) : 54.7 Heparin Dosing Weight: 75kg  Vital Signs: Temp: 98.1 F (36.7 C) (12/03 0445) Temp Source: Oral (12/03 0445) BP: 177/77 (12/03 0445) Pulse Rate: 53 (12/03 0445)  Labs: Recent Labs    10/30/17 0529  10/30/17 2356 10/31/17 1040 11/01/17 0419 11/01/17 0433  HGB 10.8*  --  10.4*  --  11.5*  --   HCT 33.7*  --  32.5*  --  35.7*  --   PLT 330  --  284  --  355  --   LABPROT  --   --   --   --   --  14.0  INR  --   --   --   --   --  1.09  HEPARINUNFRC 0.78*   < > 0.49 0.59 0.56  --   CREATININE  --   --  0.83  --   --   --    < > = values in this interval not displayed.   Estimated Creatinine Clearance: 68.6 mL/min (by C-G formula based on SCr of 0.83 mg/dL).  Medical History: Past Medical History:  Diagnosis Date  . ABDOMINAL PAIN, RECURRENT 03/29/2008  . ANXIETY 10/07/2007  . Candidiasis of mouth 05/30/2010  . GERD 07/25/2007  . Hernia, hiatal   . HYPERTENSION 07/25/2007  . Stroke [facility])    Assessment: 73 YOF presents with shortness of breath.  Recent diagnosis of bronchitis.  In ED, troponin noted to be elevated w/ ST segment depression (per EDP note).  Pharmacy asked to dose heparin gtt for NSTEMI.  Cath planned for Monday.  No documented anticoagulant use prior to admission  Today, 11/01/2017  Heparin level this AM = 0.56 units/mL, therapeutic with heparin infusion at 1050 units/hr  Daily CBC, Hgb low but stable & improved, Pltc WNL.   Renal: SCr returned to baseline  No bleeding issues or complications of therapy noted per nursing  Cardiac Cath today 10am, Heparin level in  range  Abd MRI ordered, Rad tech noted that MRI IV pump not functional, assess need for continued Heparin after Cardiac cath.  Goal of Therapy:  Heparin level 0.3-0.7 units/ml Monitor platelets by anticoagulation protocol: Yes   Plan:    Continue heparin infusion at 1050 units/hr.   Daily heparin level and CBC.  Monitor for s/sx of bleeding  MD verbal order that Heparin may be held for MRI, would resume at same rate of 1050 units/hr  Minda Ditto PharmD Pager [phone] 11/01/2017, 7:47 AM

## 2017-11-01 NOTE — Progress Notes (Signed)
[facility] PROGRESS NOTE  Erica Morris ZOX:[id number] DOB: 1946/06/14 DOA: 10/28/2017 PCP: Zenia Resides, MD  Interim summary and history of present illness 71 y.o. female with medical history significant of hypertension, hyperlipidemia, stroke, GERD, hypothyroidism, dCHF, who presents with cough, shortness breath, chest pain, abdominal pain, rashes. Patient states that she has been having dry cough and shortness of breath for almost 3 weeks, which has worsened in the past several days. Patient also has pleuritic chest pain. The chest pain is located in the left side of chest, constant, sharp, 10 out of 10 in severity, aggravated by coughing. Patient has chills, but no fever. Patient states that she has nausea, but no vomiting or diarrhea. She has abdominal pain, which is located in the central abdomen, constant, 10 out of 10 in severity, sharp. Her abdominal pain has been going on for about 1 week. Patient states she has mild dysuria, but no burning on urination or increased urinary frequency. Patient also has rashes in right leg and neck area, which has been going on for almost a month. Pt was seen by PCP on 11/27 for cough and SOB, was started with azithromycin and prednisone for possible bronchitis, without significant improvement.   Assessment/Plan: 1-chest pain: With obesity, hypertension, hyperlipidemia as risk factors for ACS. -She has not had any stratification in the past -Troponins elevated with some diffuse ST depression changes at the moment of admission.   meeting criteria for NSTEMI -2D echo reassuring: Preserved ejection fraction and no wall motion abnormalities. -After discussing with cardiology plan is to pursued left heart cath on 12/4; for now continue medical management. -Continue aspirin and statins; has been On heparin drip until cath.  2-chronic diastolic heart failure: Compensated. -No abnormality seen or describe on current echo regarding diastolic  dysfunction. -Condition appears to be compensated -Patient echo demonstrated preserved ejection fraction -Continue daily weights and strict intake and output -Continue heart healthy diet and continue current dose of Lasix.    3-acute bronchitis in the setting of chronic COPD -No wheezing appreciated, good air movement bilaterally -Continue to experience coughing spells  -Will continue steroids tapering, continue current antibiotics as listed below, continue the use of antitussive and as needed nebulizer treatment.   4-UTI: With Enterococcus faecalis and a streptococcal Gllolyticus isolated -Sensitivity demonstrated appropriate usage of Levaquin for treatment -Patient denies dysuria, has normal WBCs and is afebrile -Complete antibiotic therapy with oral regimen (3 more days of treatment; to complete a total of 7 days).  5-hypokalemia -Repleted and is stable since then -Continue to intermittently check electrolytes and further replete as needed.  6-hypertension -Stable and well-controlled -Continue current antihypertensive regimen.    7-hyperlipidemia -Continue Lipitor -LDL 94  8-morbid obesity: class 2 -Body mass index is 34.17 kg/m. -low calorie diet and increase physical activity has been discussed with patient  9-bilateral groin fungal dermatitis. -We will continue keeping area clean and dry -Significant improvement appreciated on my examination today -Continue nystatin and finished Diflucan oral therapy.  (Today will be day 5 out of 5).    10-GERD -Continue PPI  11-allergic rhinitis/nasal congestion and postnasal drip -Continue Flonase (dose changed to twice a day for better control of rhinitis) and continue loratadine.   -Still with symptoms of nasal congestion and postnasal drip.    12-Renal mass -Given concerns on CT scan for potential renal carcinoma; dedicated MRI has been ordered  -MRI results demonstrated hemorrhagic cyst and no concern for renal cell  carcinoma.  Code Status: Full code Family  Communication: No family at bedside Disposition Plan: Remains inpatient, continue treatment with steroids, antibiotics, antitussives, and medications associated with nasal congestion/postnasal drip (Flonase and loratadine).  Currently chest pain-free and with plans for cardiac cath most likely tomorrow Tuesday (11/02/17).   Consultants:  Cardiology service  Procedures:  2D echo - Left ventricle: The cavity size was normal. Wall thickness was   increased in a pattern of mild LVH. Systolic function was normal.   The estimated ejection fraction was in the range of 60% to 65%. - Mitral valve: There was mild regurgitation. - Pulmonary arteries: PA peak pressure: 34 mm Hg (S).  Antibiotics:  Levaquin 11/29>>>  HPI/Subjective: No chest pain, no fever, no nausea vomiting.  Still complaining of intermittent coughing spells and nasal congestion.  Objective: Vitals:   11/01/17 0843 11/01/17 1451  BP:  (!) 147/77  Pulse:  68  Resp:  16  Temp:  98.5 F (36.9 C)  SpO2: 95% 97%    Intake/Output Summary (Last 24 hours) at 11/01/2017 1828 Last data filed at 11/01/2017 1800 Gross per 24 hour  Intake 2496.63 ml  Output 5 ml  Net 2491.63 ml   Filed Weights   10/28/17 0502 10/31/17 1400  Weight: 90.3 kg (199 lb) 90.3 kg (199 lb 1.2 oz)    Exam:   General: Patient has remained afebrile, no chest pain, no nausea vomiting.  Continued to experience intermittent episodes of prolonged coughing spells (especially at nighttime and when laying down).  She is also still having some nasal congestion.   The rest of her physical exam has remained unchanged from my assessment on 10/31/17, please see below for details:  Cardiovascular: S1 and S2, no rubs, no gallops, no murmurs, no JVD.    Respiratory: Overall with good air movement, no wheezing, no crackles, no using accessory muscles. Scattered rhonchi appreciated.    Abdomen: Obese, soft nontender,  nondistended, positive bowel sounds, no guarding.   Musculoskeletal: No edema, no cyanosis or clubbing.    Skin: Lower extremity lesions completely resolve and significant improvement appreciated on her fungal dermatitis affecting bilateral groins.    Data Reviewed: Basic Metabolic Panel: Recent Labs  Lab 10/28/17 0425 10/28/17 0700 10/29/17 0448 10/30/17 2356  NA 139  --  138 138  K 3.2*  --  3.2* 4.2  CL 105  --  105 107  CO2 24  --  25 27  GLUCOSE 127*  --  124* 133*  BUN 10  --  20 25*  CREATININE 0.87  --  1.12* 0.83  CALCIUM 8.8*  --  8.7* 8.6*  MG  --  1.8  --   --    Liver Function Tests: Recent Labs  Lab 10/28/17 0700  AST 35  ALT 20  ALKPHOS 111  BILITOT 1.0  PROT 7.0  ALBUMIN 3.5   Recent Labs  Lab 10/28/17 0700  LIPASE 17   CBC: Recent Labs  Lab 10/28/17 0425 10/29/17 0304 10/30/17 0529 10/30/17 2356 11/01/17 0419  WBC 16.9* 15.1* 15.5* 14.3* 15.6*  NEUTROABS 14.1*  --   --   --   --   HGB 12.0 10.8* 10.8* 10.4* 11.5*  HCT 36.4 33.0* 33.7* 32.5* 35.7*  MCV 80.5 81.9 82.6 82.5 81.7  PLT 339 321 330 284 355   Cardiac Enzymes: Recent Labs  Lab 10/28/17 0425 10/28/17 0700 10/28/17 1215 10/28/17 1846  CKTOTAL 319*  --   --   --   CKMB 22.3*  --   --   --  TROPONINI  --  1.64* 1.49* 1.07*   BNP (last 3 results) Recent Labs    10/03/17 2104 10/28/17 0425  BNP 86.5 271.5*    Recent Results (from the past 240 hour(s))  Respiratory Panel by PCR     Status: None   Collection Time: 10/28/17  6:47 AM  Result Value Ref Range Status   Adenovirus NOT DETECTED NOT DETECTED Final   Coronavirus 229E NOT DETECTED NOT DETECTED Final   Coronavirus HKU1 NOT DETECTED NOT DETECTED Final   Coronavirus NL63 NOT DETECTED NOT DETECTED Final   Coronavirus OC43 NOT DETECTED NOT DETECTED Final   Metapneumovirus NOT DETECTED NOT DETECTED Final   Rhinovirus / Enterovirus NOT DETECTED NOT DETECTED Final   Influenza A NOT DETECTED NOT DETECTED Final    Influenza B NOT DETECTED NOT DETECTED Final   Parainfluenza Virus 1 NOT DETECTED NOT DETECTED Final   Parainfluenza Virus 2 NOT DETECTED NOT DETECTED Final   Parainfluenza Virus 3 NOT DETECTED NOT DETECTED Final   Parainfluenza Virus 4 NOT DETECTED NOT DETECTED Final   Respiratory Syncytial Virus NOT DETECTED NOT DETECTED Final   Bordetella pertussis NOT DETECTED NOT DETECTED Final   Chlamydophila pneumoniae NOT DETECTED NOT DETECTED Final   Mycoplasma pneumoniae NOT DETECTED NOT DETECTED Final    Comment: Performed at [facility], [place] [address] [place], [place] [address]  Urine Culture     Status: Abnormal   Collection Time: 10/28/17  6:50 AM  Result Value Ref Range Status   Specimen Description URINE, CLEAN CATCH  Final   Special Requests NONE  Final   Culture (A)  Final    50,000 COLONIES/mL ENTEROCOCCUS FAECALIS 90,000 COLONIES/mL STREPTOCOCCUS GALLOLYTICUS    Report Status 10/31/2017 FINAL  Final   Organism ID, Bacteria ENTEROCOCCUS FAECALIS (A)  Final   Organism ID, Bacteria STREPTOCOCCUS GALLOLYTICUS (A)  Final      Susceptibility   Enterococcus faecalis - MIC*    AMPICILLIN <=2 SENSITIVE Sensitive     LEVOFLOXACIN 1 SENSITIVE Sensitive     NITROFURANTOIN <=16 SENSITIVE Sensitive     VANCOMYCIN 2 SENSITIVE Sensitive     * 50,000 COLONIES/mL ENTEROCOCCUS FAECALIS   Streptococcus gallolyticus - MIC*    PENICILLIN 0.12 SENSITIVE Sensitive     CEFTRIAXONE 0.25 SENSITIVE Sensitive     ERYTHROMYCIN >=8 RESISTANT Resistant     LEVOFLOXACIN 2 SENSITIVE Sensitive     VANCOMYCIN 0.5 SENSITIVE Sensitive     * 90,000 COLONIES/mL STREPTOCOCCUS GALLOLYTICUS  Culture, blood (Routine X 2) w Reflex to ID Panel     Status: None (Preliminary result)   Collection Time: 10/28/17  7:00 AM  Result Value Ref Range Status   Specimen Description BLOOD LEFT HAND  Final   Special Requests IN PEDIATRIC BOTTLE Blood Culture adequate volume  Final   Culture   Final    NO GROWTH 4  DAYS Performed at [facility], [address] [place], [place] [address]    Report Status PENDING  Incomplete  Culture, blood (Routine X 2) w Reflex to ID Panel     Status: None (Preliminary result)   Collection Time: 10/28/17  7:00 AM  Result Value Ref Range Status   Specimen Description BLOOD RIGHT ANTECUBITAL  Final   Special Requests   Final    BOTTLES DRAWN AEROBIC AND ANAEROBIC Blood Culture adequate volume   Culture   Final    NO GROWTH 4 DAYS Performed at [facility], [place]  [address] [place], [place] [address]    Report Status PENDING  Incomplete     Studies: Mr Abdomen W Wo Contrast  Result Date: 11/01/2017 CLINICAL DATA:  Right renal mass. Suspected renal cell carcinoma. No history of malignancy. EXAM: MRI ABDOMEN WITHOUT AND WITH CONTRAST TECHNIQUE: Multiplanar multisequence MR imaging of the abdomen was performed both before and after the administration of intravenous contrast. CONTRAST:  50mL MULTIHANCE GADOBENATE DIMEGLUMINE 529 MG/ML IV SOLN COMPARISON:  Abdominopelvic CT 12/28/2016.  Abdominal CT 03/27/2008. FINDINGS: Lower chest: Patchy airspace opacities are present at both lung bases. Although suboptimally evaluated by MRI, these appear slightly worse than on recent CT and are suspicious for pneumonia. No significant pleural effusion. Hepatobiliary: The liver is normal in signal and demonstrates no focal lesion or abnormal enhancement. Multiple small gallstones are present. No gallbladder wall thickening, surrounding inflammation or biliary dilatation. Pancreas: Unremarkable. No pancreatic ductal dilatation or surrounding inflammatory changes. Spleen: Normal in size without focal abnormality. Adrenals/Urinary Tract: Both adrenal glands appear normal. Both kidneys demonstrate multiple sinus cysts. The lesion of concern in the lower pole of the right kidney measures 2.9 x 2.7 x 3.3 cm. This lesion demonstrates heterogeneous, predominantly high T1 and low T2 signal.  There is no suspicious enhancement of this lesion following contrast, confirmed with subtracted images. There are thin septations which do mildly enhance. There is no suspicious renal mass or hydronephrosis. Stomach/Bowel: No evidence of bowel wall thickening, distention or surrounding inflammatory change. Vascular/Lymphatic: There are no enlarged abdominal lymph nodes. No significant vascular findings. Aortic atherosclerosis, better seen on CT. There is a retroaortic left renal vein. The renal veins and IVC otherwise appear unremarkable. Other: No ascites. Musculoskeletal: No acute or significant osseous findings. IMPRESSION: 1. 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 Follow-up MRI in 6-12 months recommended. This recommendation follows ACR consensus guidelines: Management of the Incidental Renal Mass on CT: A White Paper of the ACR Incidental Findings Committee. J Am Coll Radiol 2018;15:264-273. 2. Renal sinus cysts bilaterally. 3. Cholelithiasis without evidence of cholecystitis or biliary dilatation. 4. Patchy airspace opacities at both lung bases, suspicious for pneumonia. These appear slightly worse than on the CT done 4 days ago. Chest radiographic follow up recommended. Electronically Signed   By: Richardean Sale M.D.   On: 11/01/2017 15:50    Scheduled Meds: . amLODipine  5 mg Oral Daily  . aspirin  81 mg Oral Daily  . atorvastatin  80 mg Oral Daily  . benzonatate  200 mg Oral TID  . dextromethorphan-guaiFENesin  1 tablet Oral BID  . docusate sodium  100 mg Oral BID  . fluconazole  100 mg Oral Daily  . fluticasone  1 spray Each Nare BID  . furosemide  20 mg Oral Daily  . levofloxacin  500 mg Oral Daily  . levothyroxine  50 mcg Oral QAC breakfast  . loratadine  10 mg Oral Daily  . metoprolol tartrate  12.5 mg Oral BID  . nystatin    Topical TID  . pantoprazole  40 mg Oral BID  . polyethylene glycol  17 g Oral Daily  . potassium chloride  40 mEq Oral Once  . predniSONE  40 mg Oral Q breakfast  . protein supplement shake  11 oz Oral BID BM  . sodium chloride flush  3 mL Intravenous Q12H  . venlafaxine XR  75 mg Oral  Q breakfast   Continuous Infusions: . sodium chloride    . sodium chloride Stopped (11/01/17 1825)  . heparin Stopped (11/01/17 1824)    Principal Problem:   Chest pain Active Problems:   Essential hypertension   GERD   Abdominal pain   Pure hypercholesterolemia   History of CVA (cerebrovascular accident)   Hypothyroidism   Diastolic CHF ([place])   Cough   NSTEMI (non-ST elevated myocardial infarction) (HCC)   Rash   Acute bronchitis    Time spent: 30 minutes    [facility] Pager [phone] . If 7PM-7AM, please contact night-coverage at [email or web address], password [facility] 11/01/2017, 6:28 PM  LOS: 4 days

## 2017-11-01 NOTE — Progress Notes (Signed)
Per RN pt cant be disconnedted from Heprin drip. pt going to [facility] at 10:00am. Per weekend shift mri IV pump is not working. Call back after pt returns from cathlab this afternoon to see if pt can be disconnected from Heprin drip. CAP 5:45am on 11/01/17

## 2017-11-01 NOTE — H&P (View-Only) (Signed)
Progress Note  Patient Name: Erica Morris Date of Encounter: 11/01/2017  Primary Cardiologist: New- Dr. Meda Coffee  Subjective   Pt appears better than on Friday with less coughing, but she says that she coughs more when she lays flat and had coughing last night when trying to sleep.   Inpatient Medications    Scheduled Meds: . aspirin  81 mg Oral Daily  . atorvastatin  80 mg Oral Daily  . benzonatate  200 mg Oral TID  . dextromethorphan-guaiFENesin  1 tablet Oral BID  . docusate sodium  100 mg Oral BID  . fluconazole  100 mg Oral Daily  . fluticasone  1 spray Each Nare Daily  . furosemide  20 mg Oral Daily  . levofloxacin  500 mg Oral Daily  . levothyroxine  50 mcg Oral QAC breakfast  . loratadine  10 mg Oral Daily  . metoprolol tartrate  25 mg Oral BID  . nystatin   Topical TID  . pantoprazole  40 mg Oral BID  . polyethylene glycol  17 g Oral Daily  . potassium chloride  40 mEq Oral Once  . predniSONE  40 mg Oral Q breakfast  . protein supplement shake  11 oz Oral BID BM  . sodium chloride flush  3 mL Intravenous Q12H  . venlafaxine XR  75 mg Oral Q breakfast   Continuous Infusions: . sodium chloride    . sodium chloride 1 mL/kg/hr (11/01/17 0536)  . heparin 1,050 Units/hr (10/31/17 2101)   PRN Meds: sodium chloride, acetaminophen, albuterol, ALPRAZolam, diphenhydrAMINE, hydrALAZINE, HYDROcodone-homatropine, iopamidol, morphine injection, nitroGLYCERIN, ondansetron, sodium chloride flush, zolpidem   Vital Signs    Vitals:   10/31/17 2108 11/01/17 0445 11/01/17 0819 11/01/17 0843  BP: (!) 168/83 (!) 177/77 (!) 151/69   Pulse: 63 (!) 53 (!) 54   Resp: 20 18    Temp: 97.8 F (36.6 C) 98.1 F (36.7 C)    TempSrc: Oral Oral    SpO2: 98% 98%  95%  Weight:      Height:        Intake/Output Summary (Last 24 hours) at 11/01/2017 0900 Last data filed at 11/01/2017 6226 Gross per 24 hour  Intake 1828.95 ml  Output -  Net 1828.95 ml   Filed Weights   10/28/17  0502 10/31/17 1400  Weight: 199 lb (90.3 kg) 199 lb 1.2 oz (90.3 kg)    Telemetry    Sinus bradycardia in the 50's, this am had 3 beats of NSVT followed by 8 beats of NCT - Personally Reviewed  ECG    No new tracings - Personally Reviewed  Physical Exam   GEN: No acute distress.   Neck: No JVD Cardiac: RRR, no murmurs, rubs, or gallops.  Respiratory: Clear to auscultation bilaterally. Occ non-productive cough. GI: Soft, nontender, non-distended  MS: No edema; No deformity. Neuro:  Nonfocal  Psych: Normal affect   Labs    Chemistry Recent Labs  Lab 10/28/17 0425 10/28/17 0700 10/29/17 0448 10/30/17 2356  NA 139  --  138 138  K 3.2*  --  3.2* 4.2  CL 105  --  105 107  CO2 24  --  25 27  GLUCOSE 127*  --  124* 133*  BUN 10  --  20 25*  CREATININE 0.87  --  1.12* 0.83  CALCIUM 8.8*  --  8.7* 8.6*  PROT  --  7.0  --   --   ALBUMIN  --  3.5  --   --  AST  --  35  --   --   ALT  --  20  --   --   ALKPHOS  --  111  --   --   BILITOT  --  1.0  --   --   GFRNONAA >60  --  49* >60  GFRAA >60  --  56* >60  ANIONGAP 10  --  8 4*     Hematology Recent Labs  Lab 10/30/17 0529 10/30/17 2356 11/01/17 0419  WBC 15.5* 14.3* 15.6*  RBC 4.08 3.94 4.37  HGB 10.8* 10.4* 11.5*  HCT 33.7* 32.5* 35.7*  MCV 82.6 82.5 81.7  MCH 26.5 26.4 26.3  MCHC 32.0 32.0 32.2  RDW 14.1 13.8 13.7  PLT 330 284 355    Cardiac Enzymes Recent Labs  Lab 10/28/17 0700 10/28/17 1215 10/28/17 1846  TROPONINI 1.64* 1.49* 1.07*    Recent Labs  Lab 10/28/17 0443  TROPIPOC 1.75*     BNP Recent Labs  Lab 10/28/17 0425  BNP 271.5*     DDimer No results for input(s): DDIMER in the last 168 hours.   Radiology    No results found.  Cardiac Studies   Echocardiogram 10/28/17 Study Conclusions  - Left ventricle: The cavity size was normal. Wall thickness was increased in a pattern of mild LVH. Systolic function was normal. The estimated ejection fraction was in the range  of 60% to 65%. - Mitral valve: There was mild regurgitation. - Pulmonary arteries: PA peak pressure: 34 mm Hg (S).  Patient Profile     71 y.o. female with a history of HTN, hyperlipidemia, stroke, GERD, hypothyroidism, diastolic CHF with non-ST elevation myocardial infarction, chest pain and elevated troponin. EKG with diffuse ST depression. Also has UTI and bronchitis.   Assessment & Plan    Non-STEMI -In setting of bronchitis and UTI with leukocytosis -Troponin on presentation 1.75, trended down to 1.07, continue heparin until heart catheterization -Mostly atypical pleuritic chest pain, but also has had some underlying typical exertional chest pressure with dyspnea over the past few months.  -EKG with new diffuse ST depression on presentation -IV heparin infusing. Planned for cardiac cath. Have been unable to perform cardiac cath due to significant coughing. She is receiving cough suppressants, but still has significant non-productive coughing worse when she lays flat. -The patient did not have significant typical chest pain on admission. Will discuss continued treatment of bronchitis and allowing pt to convalesce at home and bring in for outpatient cath once she is fully recovered. She has had well over 48 hrs of heparin-should be able to stop.   Chronic diastolic CHF -Presumptive with previous Echo 10/2016 showing EF 60-65%. Treated with diuretic as outpatient for peripheral edema and shortness of breath.  -Echo done on 10/28/17 showed EF 60-65%, mild MR, PA peak pressure 34 mmHg, mild LVH -Volume status is stable. -Continuing on lasix 20 mg daily. Wt stable.   Sinus bradycardia -Heart rates in the 50's, Had some brief NSVT and NCT this am.  -Will decrease BB.   Acute bronchitis/right lower lobe infiltrate PNA seen on abdominal CT -Frequent non-productive cough, improved since I last saw on Friday, but still present. -WBC 15.6 today. Management per IM  UTI -Being treated with  Levaquin per IM  Hypertension -Blood pressures still elevated.  -Will add low dose amlodipine, she had previous cough with ACE-I  Hypokalemia -Replaced, currently stable  Hyperlipidemia -LDL 176 in 10/2016,patient is on atorvastatin 80 mg daily -Updated lipid panel  with LDL 94, improved  Renal mass -CT interpretation recommended MRI.  There is concern for neoplasm on report.  Discussed with Dr. Dyann Kief.  It would be nice to have this clarified prior to heart catheterization in case further surgical management is necessary.  Per Dr. Anson Fret note, an MRI has been ordered.    For questions or updates, please contact [place] Please consult [email or web address] for contact info under Cardiology/STEMI.      Signed, Daune Perch, NP  11/01/2017, 9:00 AM

## 2017-11-02 ENCOUNTER — Encounter (HOSPITAL_COMMUNITY)
Admission: EM | Disposition: A | Discharge status: Home / Self Care | Payer: Self-pay | Source: Home / Self Care | Attending: Internal Medicine

## 2017-11-02 ENCOUNTER — Encounter (HOSPITAL_COMMUNITY): Payer: Self-pay | Admitting: Cardiovascular Disease

## 2017-11-02 DIAGNOSIS — R748 Abnormal levels of other serum enzymes: Secondary | ICD-10-CM

## 2017-11-02 DIAGNOSIS — R778 Other specified abnormalities of plasma proteins: Secondary | ICD-10-CM

## 2017-11-02 DIAGNOSIS — R7989 Other specified abnormal findings of blood chemistry: Secondary | ICD-10-CM

## 2017-11-02 HISTORY — PX: LEFT HEART CATH AND CORONARY ANGIOGRAPHY: CATH118249

## 2017-11-02 LAB — BASIC METABOLIC PANEL
Anion gap: 8 (ref 5–15)
BUN: 17 mg/dL (ref 6–20)
CO2: 29 mmol/L (ref 22–32)
Calcium: 8.7 mg/dL — ABNORMAL LOW (ref 8.9–10.3)
Chloride: 103 mmol/L (ref 101–111)
Creatinine, Ser: 0.88 mg/dL (ref 0.44–1.00)
GFR calc Af Amer: >60 mL/min (ref >60)
GFR calc non Af Amer: >60 mL/min (ref >60)
Glucose, Bld: 105 mg/dL — ABNORMAL HIGH (ref 65–99)
Potassium: 3.8 mmol/L (ref 3.5–5.1)
Sodium: 140 mmol/L (ref 135–145)

## 2017-11-02 LAB — CBC
HCT: 35.7 % — ABNORMAL LOW (ref 36.0–46.0)
Hemoglobin: 11.4 g/dL — ABNORMAL LOW (ref 12.0–15.0)
MCH: 26 pg (ref 26.0–34.0)
MCHC: 31.9 g/dL (ref 30.0–36.0)
MCV: 81.5 fL (ref 78.0–100.0)
Platelets: 310 10*3/uL (ref 150–400)
RBC: 4.38 MIL/uL (ref 3.87–5.11)
RDW: 13.7 % (ref 11.5–15.5)
WBC: 12.2 10*3/uL — ABNORMAL HIGH (ref 4.0–10.5)

## 2017-11-02 LAB — CULTURE, BLOOD (ROUTINE X 2)
Culture: NO GROWTH
Culture: NO GROWTH
Special Requests: ADEQUATE
Special Requests: ADEQUATE

## 2017-11-02 LAB — HEPARIN LEVEL (UNFRACTIONATED): Heparin Unfractionated: 0.38 IU/mL (ref 0.30–0.70)

## 2017-11-02 SURGERY — LEFT HEART CATH AND CORONARY ANGIOGRAPHY
Anesthesia: LOCAL

## 2017-11-02 MED ORDER — SODIUM CHLORIDE 0.9% FLUSH
3.0000 mL | Freq: Two times a day (BID) | INTRAVENOUS | Status: DC
  Administered 2017-11-02 – 2017-11-03 (×3): 3 mL via INTRAVENOUS

## 2017-11-02 MED ORDER — HEPARIN SODIUM (PORCINE) 1000 UNIT/ML IJ SOLN
INTRAMUSCULAR | Status: AC
  Filled 2017-11-02: qty 1

## 2017-11-02 MED ORDER — AMLODIPINE BESYLATE 10 MG PO TABS
10.0000 mg | ORAL_TABLET | Freq: Every day | ORAL | Status: DC
  Administered 2017-11-02 – 2017-11-03 (×2): 10 mg via ORAL
  Filled 2017-11-02 (×2): qty 1

## 2017-11-02 MED ORDER — IOPAMIDOL (ISOVUE-370) INJECTION 76%
INTRAVENOUS | Status: DC | PRN
  Administered 2017-11-02: 50 mL via INTRA_ARTERIAL

## 2017-11-02 MED ORDER — FENTANYL CITRATE (PF) 100 MCG/2ML IJ SOLN
INTRAMUSCULAR | Status: DC | PRN
  Administered 2017-11-02: 25 ug via INTRAVENOUS

## 2017-11-02 MED ORDER — SODIUM CHLORIDE 0.9% FLUSH: 3.0000 mL | INTRAVENOUS | Status: DC | PRN

## 2017-11-02 MED ORDER — IOPAMIDOL (ISOVUE-370) INJECTION 76%
INTRAVENOUS | Status: AC
  Filled 2017-11-02: qty 100

## 2017-11-02 MED ORDER — SODIUM CHLORIDE 0.9 % IV SOLN: INTRAVENOUS | Status: AC

## 2017-11-02 MED ORDER — HEPARIN SODIUM (PORCINE) 1000 UNIT/ML IJ SOLN
INTRAMUSCULAR | Status: DC | PRN
  Administered 2017-11-02: 4500 [IU] via INTRAVENOUS

## 2017-11-02 MED ORDER — VERAPAMIL HCL 2.5 MG/ML IV SOLN
INTRAVENOUS | Status: DC | PRN
  Administered 2017-11-02: 10 mL via INTRA_ARTERIAL

## 2017-11-02 MED ORDER — HEPARIN (PORCINE) IN NACL 2-0.9 UNIT/ML-% IJ SOLN
INTRAMUSCULAR | Status: AC
  Filled 2017-11-02: qty 1000

## 2017-11-02 MED ORDER — PREDNISONE 20 MG PO TABS
30.0000 mg | ORAL_TABLET | Freq: Every day | ORAL | Status: DC
  Administered 2017-11-03: 30 mg via ORAL
  Filled 2017-11-02: qty 1

## 2017-11-02 MED ORDER — MIDAZOLAM HCL 2 MG/2ML IJ SOLN
INTRAMUSCULAR | Status: DC | PRN
  Administered 2017-11-02: 1 mg via INTRAVENOUS

## 2017-11-02 MED ORDER — FENTANYL CITRATE (PF) 100 MCG/2ML IJ SOLN
INTRAMUSCULAR | Status: AC
  Filled 2017-11-02: qty 2

## 2017-11-02 MED ORDER — LIDOCAINE HCL (PF) 1 % IJ SOLN
INTRAMUSCULAR | Status: DC | PRN
  Administered 2017-11-02: 5 mL

## 2017-11-02 MED ORDER — SODIUM CHLORIDE 0.9 % IV SOLN: 250.0000 mL | INTRAVENOUS | Status: DC | PRN

## 2017-11-02 MED ORDER — HEPARIN (PORCINE) IN NACL 2-0.9 UNIT/ML-% IJ SOLN
INTRAMUSCULAR | Status: AC | PRN
  Administered 2017-11-02: 1000 mL

## 2017-11-02 MED ORDER — LIDOCAINE HCL (PF) 1 % IJ SOLN
INTRAMUSCULAR | Status: AC
  Filled 2017-11-02: qty 30

## 2017-11-02 MED ORDER — VERAPAMIL HCL 2.5 MG/ML IV SOLN
INTRAVENOUS | Status: AC
  Filled 2017-11-02: qty 2

## 2017-11-02 MED ORDER — MIDAZOLAM HCL 2 MG/2ML IJ SOLN
INTRAMUSCULAR | Status: AC
  Filled 2017-11-02: qty 2

## 2017-11-02 SURGICAL SUPPLY — 9 items

## 2017-11-02 NOTE — Progress Notes (Signed)
Report called to Sherlyn Lick, all questions answered.  VSS.  Packet given to [facility] for transport.  Heparin stopped at 0945 per NP Daune Perch verbal order.

## 2017-11-02 NOTE — Progress Notes (Signed)
Progress Note  Patient Name: ARIHANNA ESTABROOK Date of Encounter: 11/02/2017  Primary Cardiologist: New- Dr Meda Coffee  Subjective   Cough is better today. She was able to sleep last night. She did have some mild left and center chest pressure lasting only a few seconds.   Inpatient Medications    Scheduled Meds: . amLODipine  5 mg Oral Daily  . aspirin  81 mg Oral Daily  . atorvastatin  80 mg Oral Daily  . benzonatate  200 mg Oral TID  . dextromethorphan-guaiFENesin  1 tablet Oral BID  . docusate sodium  100 mg Oral BID  . fluconazole  100 mg Oral Daily  . fluticasone  1 spray Each Nare BID  . furosemide  20 mg Oral Daily  . levofloxacin  500 mg Oral Daily  . levothyroxine  50 mcg Oral QAC breakfast  . loratadine  10 mg Oral Daily  . metoprolol tartrate  12.5 mg Oral BID  . nystatin   Topical TID  . pantoprazole  40 mg Oral BID  . polyethylene glycol  17 g Oral Daily  . potassium chloride  40 mEq Oral Once  . predniSONE  40 mg Oral Q breakfast  . protein supplement shake  11 oz Oral BID BM  . sodium chloride flush  3 mL Intravenous Q12H  . venlafaxine XR  75 mg Oral Q breakfast   Continuous Infusions: . sodium chloride    . sodium chloride 1 mL/kg/hr (11/02/17 0600)  . heparin 1,050 Units/hr (11/01/17 2245)   PRN Meds: sodium chloride, acetaminophen, albuterol, ALPRAZolam, diphenhydrAMINE, hydrALAZINE, HYDROcodone-homatropine, iopamidol, morphine injection, nitroGLYCERIN, ondansetron, sodium chloride flush, zolpidem   Vital Signs    Vitals:   11/01/17 1451 11/01/17 2110 11/01/17 2240 11/02/17 0443  BP: (!) 147/77 (!) 179/86  (!) 163/84  Pulse: 68 60  63  Resp: 16 17  18   Temp: 98.5 F (36.9 C) 98 F (36.7 C)  98.1 F (36.7 C)  TempSrc: Oral Oral  Oral  SpO2: 97% 97% 98% 97%  Weight:      Height:        Intake/Output Summary (Last 24 hours) at 11/02/2017 0748 Last data filed at 11/02/2017 0640 Gross per 24 hour  Intake 2804.47 ml  Output 5 ml  Net 2799.47  ml   Filed Weights   10/28/17 0502 10/31/17 1400  Weight: 199 lb (90.3 kg) 199 lb 1.2 oz (90.3 kg)    Telemetry    SB/SR in the 50's-60's with occ runs of NSVT - Personally Reviewed  ECG    No new tracings - Personally Reviewed  Physical Exam   GEN: No acute distress.   Neck: No JVD Cardiac: RRR, no murmurs, rubs, or gallops.  Respiratory: Clear to auscultation bilaterally. GI: Soft, nontender, non-distended  MS: No edema; No deformity. Neuro:  Nonfocal  Psych: Normal affect   Labs    Chemistry Recent Labs  Lab 10/28/17 0700 10/29/17 0448 10/30/17 2356 11/02/17 0425  NA  --  138 138 140  K  --  3.2* 4.2 3.8  CL  --  105 107 103  CO2  --  25 27 29   GLUCOSE  --  124* 133* 105*  BUN  --  20 25* 17  CREATININE  --  1.12* 0.83 0.88  CALCIUM  --  8.7* 8.6* 8.7*  PROT 7.0  --   --   --   ALBUMIN 3.5  --   --   --   AST  35  --   --   --   ALT 20  --   --   --   ALKPHOS 111  --   --   --   BILITOT 1.0  --   --   --   GFRNONAA  --  49* >60 >60  GFRAA  --  56* >60 >60  ANIONGAP  --  8 4* 8     Hematology Recent Labs  Lab 10/30/17 2356 11/01/17 0419 11/02/17 0425  WBC 14.3* 15.6* 12.2*  RBC 3.94 4.37 4.38  HGB 10.4* 11.5* 11.4*  HCT 32.5* 35.7* 35.7*  MCV 82.5 81.7 81.5  MCH 26.4 26.3 26.0  MCHC 32.0 32.2 31.9  RDW 13.8 13.7 13.7  PLT 284 355 310    Cardiac Enzymes Recent Labs  Lab 10/28/17 0700 10/28/17 1215 10/28/17 1846  TROPONINI 1.64* 1.49* 1.07*    Recent Labs  Lab 10/28/17 0443  TROPIPOC 1.75*     BNP Recent Labs  Lab 10/28/17 0425  BNP 271.5*     DDimer No results for input(s): DDIMER in the last 168 hours.   Radiology    Mr Abdomen W Wo Contrast  Result Date: 11/01/2017 CLINICAL DATA:  Right renal mass. Suspected renal cell carcinoma. No history of malignancy. EXAM: MRI ABDOMEN WITHOUT AND WITH CONTRAST TECHNIQUE: Multiplanar multisequence MR imaging of the abdomen was performed both before and after the administration of  intravenous contrast. CONTRAST:  29mL MULTIHANCE GADOBENATE DIMEGLUMINE 529 MG/ML IV SOLN COMPARISON:  Abdominopelvic CT 12/28/2016.  Abdominal CT 03/27/2008. FINDINGS: Lower chest: Patchy airspace opacities are present at both lung bases. Although suboptimally evaluated by MRI, these appear slightly worse than on recent CT and are suspicious for pneumonia. No significant pleural effusion. Hepatobiliary: The liver is normal in signal and demonstrates no focal lesion or abnormal enhancement. Multiple small gallstones are present. No gallbladder wall thickening, surrounding inflammation or biliary dilatation. Pancreas: Unremarkable. No pancreatic ductal dilatation or surrounding inflammatory changes. Spleen: Normal in size without focal abnormality. Adrenals/Urinary Tract: Both adrenal glands appear normal. Both kidneys demonstrate multiple sinus cysts. The lesion of concern in the lower pole of the right kidney measures 2.9 x 2.7 x 3.3 cm. This lesion demonstrates heterogeneous, predominantly high T1 and low T2 signal. There is no suspicious enhancement of this lesion following contrast, confirmed with subtracted images. There are thin septations which do mildly enhance. There is no suspicious renal mass or hydronephrosis. Stomach/Bowel: No evidence of bowel wall thickening, distention or surrounding inflammatory change. Vascular/Lymphatic: There are no enlarged abdominal lymph nodes. No significant vascular findings. Aortic atherosclerosis, better seen on CT. There is a retroaortic left renal vein. The renal veins and IVC otherwise appear unremarkable. Other: No ascites. Musculoskeletal: No acute or significant osseous findings. IMPRESSION: 1. 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 Follow-up MRI in 6-12 months recommended. This recommendation follows ACR  consensus guidelines: Management of the Incidental Renal Mass on CT: A White Paper of the ACR Incidental Findings Committee. J Am Coll Radiol 2018;15:264-273. 2. Renal sinus cysts bilaterally. 3. Cholelithiasis without evidence of cholecystitis or biliary dilatation. 4. Patchy airspace opacities at both lung bases, suspicious for pneumonia. These appear slightly worse than on the CT done 4 days ago. Chest radiographic follow up recommended. Electronically Signed   By: Richardean Sale M.D.   On: 11/01/2017 15:50  Cardiac Studies   Echocardiogram 10/28/17 Study Conclusions  - Left ventricle: The cavity size was normal. Wall thickness was increased in a pattern of mild LVH. Systolic function was normal. The estimated ejection fraction was in the range of 60% to 65%. - Mitral valve: There was mild regurgitation. - Pulmonary arteries: PA peak pressure: 34 mm Hg (S).   Patient Profile     71 y.o. female with a history of HTN, hyperlipidemia, stroke, GERD, hypothyroidism, diastolic CHFwith non-ST elevation myocardial infarction, chest pain and elevated troponin. EKG with diffuse ST depression. Also has UTI and bronchitis.   Assessment & Plan    Non-STEMI -In the setting of bronchitis and UTI. -Troponin on presentation was 1.75, trended down to 1.07, continuing on heparin until heart cath -Pt with mostly atypical pleuritic chest pain, but also has had some underlying typical exertional chest pressure with dyspnea over the last few months EKG with new diffuse ST depression on presentation Planned for cardiac cath for definitive cardiac evaluation, has been postponed due to frequent deep non-productive coughing. Today cough is better. Will proceed with cath. Pt is to receive cough suppressants prior to procedure.   Chronic diastolic CHF -Has been presumptive by history. Previous cath showed normal LV systolic function. Treated with diuretic as outpt for peripheral edema and shortness of  breath.  -Echo done on 10/28/17 showed EF 60-65%, mild MR, PA peak pressure 34 mmHg, mild LVH -Volume is stable -continuing on lasix 20 mg daily.  NSVT -Baseline cardiac rhythm is SB/SR with rates in the 50's-60's. Is on low dose BB. -Assess coronary arteries via cath for possible ischemic related NSVT.  Hypertension -Blood pressure still elevated -Low dose amlodipine was added yesterday, will increase to 10 mg. (Has had cough in the past with ACE-I)  Acute bronchitis/right lower lobe infiltrate PNA seen on abdominal CT -Frequent non-productive cough, improved since I last saw on Friday, but still present. -WBC down to 12.2 today. Management per IM  UTI -Being treated with Levaquin per IM  Hypokalemia -Replaced, currently stable  Hyperlipidemia -LDL 176 in 10/2016,patient is on atorvastatin 80 mg daily -Updated lipid panel with LDL 94, improved  Renal mass -CT interpretation recommended MRI. There was concern for neoplasm on CT report. MRI was done and demonstrated hemorrhagic cyst and no concern for renal cell carcinoma.    For questions or updates, please contact [place] Please consult [email or web address] for contact info under Cardiology/STEMI.      Signed, Daune Perch, NP  11/02/2017, 7:48 AM

## 2017-11-02 NOTE — Progress Notes (Signed)
Pt had left Heart Cath today. Waiting for discharge plans.

## 2017-11-02 NOTE — Progress Notes (Signed)
TR BAND REMOVAL  LOCATION:    Right radial  DEFLATED PER PROTOCOL:    Yes.    TIME BAND OFF / DRESSING APPLIED:    1345p   SITE UPON ARRIVAL:    Level 0  SITE AFTER BAND REMOVAL:    Level 1  CIRCULATION SENSATION AND MOVEMENT:    Within Normal Limits   Yes.    COMMENTS:   Pt denies any discomfort at site.

## 2017-11-02 NOTE — Progress Notes (Signed)
Received pt from [facility] via [place] alert and oriented X4 .  BP 195/90, 64, 94% RA resp 18.  Pt denies any CP at this time.  Dr Angelena Form into see pt.  Pt  Voided and put on monitor with IVF infusing..  Pt to procedure area via stretcher and consent signed and on the chart.

## 2017-11-02 NOTE — Progress Notes (Signed)
[place] for heparin Indication: chest pain/ACS  Allergies  Allergen Reactions  . Lisinopril Cough  . Codeine Phosphate Itching  . Morphine And Related Itching    Burning sensation and itchiness with IV morphine   Patient Measurements: Height: 5\' 4"  (162.6 cm) Weight: 199 lb 1.2 oz (90.3 kg) IBW/kg (Calculated) : 54.7 Heparin Dosing Weight: 75kg  Vital Signs: Temp: 98.1 F (36.7 C) (12/04 0443) Temp Source: Oral (12/04 0443) BP: 163/84 (12/04 0443) Pulse Rate: 63 (12/04 0443)  Labs: Recent Labs    10/30/17 2356 10/31/17 1040 11/01/17 0419 11/01/17 0433 11/02/17 0425  HGB 10.4*  --  11.5*  --  11.4*  HCT 32.5*  --  35.7*  --  35.7*  PLT 284  --  355  --  310  LABPROT  --   --   --  14.0  --   INR  --   --   --  1.09  --   HEPARINUNFRC 0.49 0.59 0.56  --  0.38  CREATININE 0.83  --   --   --  0.88   Estimated Creatinine Clearance: 64.7 mL/min (by C-G formula based on SCr of 0.88 mg/dL).  Assessment: 64 YOF presents with shortness of breath.  Recent diagnosis of bronchitis.  In ED, troponin noted to be elevated w/ ST segment depression (per EDP note).  Pharmacy asked to dose heparin gtt for NSTEMI. Cath planned for Monday; now rescheduled to Tuesday d/t severe coughing  No documented anticoagulant use prior to admission  Today, 11/02/2017  Heparin level therapeutic this AM  Hgb low but stable/improved, Pltc WNL.   Renal: SCr returned to baseline  No bleeding issues or complications of therapy noted per nursing  Heparin off 1.5 hrs for MRI on 12/3  Goal of Therapy:  Heparin level 0.3-0.7 units/ml Monitor platelets by anticoagulation protocol: Yes   Plan:    Continue heparin infusion at 1050 units/hr.   Daily heparin level and CBC  Monitor for s/sx of bleeding  Plan for Cath today; f/u any [facility] plans post-cath  Reuel Boom, PharmD, BCPS Pager: [phone] 11/02/2017, 8:22 AM

## 2017-11-02 NOTE — Progress Notes (Signed)
[facility] PROGRESS NOTE  BRESHA HOSACK DVV:[id number] DOB: [birth year] DOA: 10/28/2017 PCP: Zenia Resides, MD  Interim summary and history of present illness 71 y.o. female with medical history significant of hypertension, hyperlipidemia, stroke, GERD, hypothyroidism, dCHF, who presents with cough, shortness breath, chest pain, abdominal pain, rashes. Patient states that she has been having dry cough and shortness of breath for almost 3 weeks, which has worsened in the past several days. Patient also has pleuritic chest pain. The chest pain is located in the left side of chest, constant, sharp, 10 out of 10 in severity, aggravated by coughing. Patient has chills, but no fever. Patient states that she has nausea, but no vomiting or diarrhea. She has abdominal pain, which is located in the central abdomen, constant, 10 out of 10 in severity, sharp. Her abdominal pain has been going on for about 1 week. Patient states she has mild dysuria, but no burning on urination or increased urinary frequency. Patient also has rashes in right leg and neck area, which has been going on for almost a month. Pt was seen by PCP on 11/27 for cough and SOB, was started with azithromycin and prednisone for possible bronchitis, without significant improvement.   Assessment/Plan: 1-chest pain: With obesity, hypertension, hyperlipidemia as risk factors for ACS. -She has not had any stratification in the past -Troponins elevated with some diffuse ST depression changes at the moment of admission.   meeting criteria for NSTEMI -2D echo reassuring: Preserved ejection fraction and no wall motion abnormalities. -After discussing with cardiology plan is to pursued left heart cath later today 12/4; for now continue medical management. -Continue aspirin and statins; will continue On heparin drip until cath.  2-chronic diastolic heart failure: Compensated. -No abnormality seen or describe on current echo regarding  diastolic dysfunction. -Condition appears to be compensated -Patient echo demonstrated preserved ejection fraction -Continue daily weights and strict intake and output -Continue heart healthy diet and continue current dose of Lasix.    3-acute bronchitis in the setting of chronic COPD -No wheezing appreciated, good air movement bilaterally -Continue to experience coughing spells  -Will continue slow steroids tapering, continue current antibiotics as listed below, continue the use of antitussive and as needed nebulizer treatment.   4-UTI: With Enterococcus faecalis and a streptococcal Gllolyticus isolated -no dysuria -no fever -plan is to complete 2 more days of antibiotics   5-hypokalemia -repleted Intermittently follow electrolytes and further replete as needed.  6-hypertension -well controlled -continue current antihypertensive regimen    7-hyperlipidemia -LDL 94 -continue lipitor  8-morbid obesity: class 2 -Body mass index is 34.17 kg/m. -low calorie diet and increase physical activity has been discussed with patient  9-bilateral groin fungal dermatitis. -significant improvement; denies itching now -has complete 5 days of diflucan -will continue nystatin powder -advise to keep skin clean and dry.  10-GERD -continue PPI  11-allergic rhinitis/nasal congestion and postnasal drip -Continue Flonase (dose changed to twice a day for better control of rhinitis) and continue loratadine.  -Still with symptoms of nasal congestion and postnasal drip; but improved.Marland Kitchen    12-Renal mass -CT scan with abnormalities and concerns for RCC. -no renal cell carcinoma seen on MRI -patient with hemorrhagic cyst. -no further work up needed  Code Status: Full code Family Communication: No family at bedside Disposition Plan: continue current antibiotics, cath later today, continue PRN antitussive and steroids tapering. Home once medically stable (most likely  tomorrow)  Consultants:  Cardiology service  Procedures:  2D echo - Left ventricle: The  cavity size was normal. Wall thickness was   increased in a pattern of mild LVH. Systolic function was normal.   The estimated ejection fraction was in the range of 60% to 65%. - Mitral valve: There was mild regurgitation. - Pulmonary arteries: PA peak pressure: 34 mm Hg (S).  Antibiotics:  Levaquin 11/29>>>  HPI/Subjective: Afebrile, no CP, no SOB, no nausea, no vomiting. Still with some intermittent cough, but improving. Slightly nervous about cath later today.  Objective: Vitals:   11/01/17 2240 11/02/17 0443  BP:  (!) 163/84  Pulse:  63  Resp:  18  Temp:  98.1 F (36.7 C)  SpO2: 98% 97%    Intake/Output Summary (Last 24 hours) at 11/02/2017 1106 Last data filed at 11/02/2017 0640 Gross per 24 hour  Intake 2804.47 ml  Output 5 ml  Net 2799.47 ml   Filed Weights   10/28/17 0502 10/31/17 1400  Weight: 90.3 kg (199 lb) 90.3 kg (199 lb 1.2 oz)    Exam:   General: no fever, no CP, no palpitations, no nausea, no vomiting. Slightly nervous about cath. Still with some cough, but reported improvement.    Cardiovascular: S1 and S2, no rubs, no gallops, no murmur, no JVD.     Respiratory: no wheezing, no crackles, scattered rhonchi. Normal resp effort.    Abdomen: obese, soft, NT, ND, positive BS.   Musculoskeletal: no edema, no cyanosis, no clubbing.    Skin: resolved abrasion and scabs seen on her LE; almost completely resolved fungal dermatitis in her groins. No rash, no petechiae, no bruises.    Data Reviewed: Basic Metabolic Panel: Recent Labs  Lab 10/28/17 0425 10/28/17 0700 10/29/17 0448 10/30/17 2356 11/02/17 0425  NA 139  --  138 138 140  K 3.2*  --  3.2* 4.2 3.8  CL 105  --  105 107 103  CO2 24  --  25 27 29   GLUCOSE 127*  --  124* 133* 105*  BUN 10  --  20 25* 17  CREATININE 0.87  --  1.12* 0.83 0.88  CALCIUM 8.8*  --  8.7* 8.6* 8.7*  MG  --  1.8  --    --   --    Liver Function Tests: Recent Labs  Lab 10/28/17 0700  AST 35  ALT 20  ALKPHOS 111  BILITOT 1.0  PROT 7.0  ALBUMIN 3.5   Recent Labs  Lab 10/28/17 0700  LIPASE 17   CBC: Recent Labs  Lab 10/28/17 0425 10/29/17 0304 10/30/17 0529 10/30/17 2356 11/01/17 0419 11/02/17 0425  WBC 16.9* 15.1* 15.5* 14.3* 15.6* 12.2*  NEUTROABS 14.1*  --   --   --   --   --   HGB 12.0 10.8* 10.8* 10.4* 11.5* 11.4*  HCT 36.4 33.0* 33.7* 32.5* 35.7* 35.7*  MCV 80.5 81.9 82.6 82.5 81.7 81.5  PLT 339 321 330 284 355 310   Cardiac Enzymes: Recent Labs  Lab 10/28/17 0425 10/28/17 0700 10/28/17 1215 10/28/17 1846  CKTOTAL 319*  --   --   --   CKMB 22.3*  --   --   --   TROPONINI  --  1.64* 1.49* 1.07*   BNP (last 3 results) Recent Labs    10/03/17 2104 10/28/17 0425  BNP 86.5 271.5*    Recent Results (from the past 240 hour(s))  Respiratory Panel by PCR     Status: None   Collection Time: 10/28/17  6:47 AM  Result Value Ref Range Status  Adenovirus NOT DETECTED NOT DETECTED Final   Coronavirus 229E NOT DETECTED NOT DETECTED Final   Coronavirus HKU1 NOT DETECTED NOT DETECTED Final   Coronavirus NL63 NOT DETECTED NOT DETECTED Final   Coronavirus OC43 NOT DETECTED NOT DETECTED Final   Metapneumovirus NOT DETECTED NOT DETECTED Final   Rhinovirus / Enterovirus NOT DETECTED NOT DETECTED Final   Influenza A NOT DETECTED NOT DETECTED Final   Influenza B NOT DETECTED NOT DETECTED Final   Parainfluenza Virus 1 NOT DETECTED NOT DETECTED Final   Parainfluenza Virus 2 NOT DETECTED NOT DETECTED Final   Parainfluenza Virus 3 NOT DETECTED NOT DETECTED Final   Parainfluenza Virus 4 NOT DETECTED NOT DETECTED Final   Respiratory Syncytial Virus NOT DETECTED NOT DETECTED Final   Bordetella pertussis NOT DETECTED NOT DETECTED Final   Chlamydophila pneumoniae NOT DETECTED NOT DETECTED Final   Mycoplasma pneumoniae NOT DETECTED NOT DETECTED Final    Comment: Performed at [facility], [place] [address], [place], [place] [address]  Urine Culture     Status: Abnormal   Collection Time: 10/28/17  6:50 AM  Result Value Ref Range Status   Specimen Description URINE, CLEAN CATCH  Final   Special Requests NONE  Final   Culture (A)  Final    50,000 COLONIES/mL ENTEROCOCCUS FAECALIS 90,000 COLONIES/mL STREPTOCOCCUS GALLOLYTICUS    Report Status 10/31/2017 FINAL  Final   Organism ID, Bacteria ENTEROCOCCUS FAECALIS (A)  Final   Organism ID, Bacteria STREPTOCOCCUS GALLOLYTICUS (A)  Final      Susceptibility   Enterococcus faecalis - MIC*    AMPICILLIN <=2 SENSITIVE Sensitive     LEVOFLOXACIN 1 SENSITIVE Sensitive     NITROFURANTOIN <=16 SENSITIVE Sensitive     VANCOMYCIN 2 SENSITIVE Sensitive     * 50,000 COLONIES/mL ENTEROCOCCUS FAECALIS   Streptococcus gallolyticus - MIC*    PENICILLIN 0.12 SENSITIVE Sensitive     CEFTRIAXONE 0.25 SENSITIVE Sensitive     ERYTHROMYCIN >=8 RESISTANT Resistant     LEVOFLOXACIN 2 SENSITIVE Sensitive     VANCOMYCIN 0.5 SENSITIVE Sensitive     * 90,000 COLONIES/mL STREPTOCOCCUS GALLOLYTICUS  Culture, blood (Routine X 2) w Reflex to ID Panel     Status: None (Preliminary result)   Collection Time: 10/28/17  7:00 AM  Result Value Ref Range Status   Specimen Description BLOOD LEFT HAND  Final   Special Requests IN PEDIATRIC BOTTLE Blood Culture adequate volume  Final   Culture   Final    NO GROWTH 4 DAYS Performed at [facility], [address], [place], [place] [address]    Report Status PENDING  Incomplete  Culture, blood (Routine X 2) w Reflex to ID Panel     Status: None (Preliminary result)   Collection Time: 10/28/17  7:00 AM  Result Value Ref Range Status   Specimen Description BLOOD RIGHT ANTECUBITAL  Final   Special Requests   Final    BOTTLES DRAWN AEROBIC AND ANAEROBIC Blood Culture adequate volume   Culture   Final    NO GROWTH 4 DAYS Performed at [facility], [place] [address] [place], [place] [address]     Report Status PENDING  Incomplete     Studies: Mr Abdomen W Wo Contrast  Result Date: 11/01/2017 CLINICAL DATA:  Right renal mass. Suspected renal cell carcinoma. No history of malignancy. EXAM: MRI ABDOMEN WITHOUT AND WITH CONTRAST TECHNIQUE: Multiplanar multisequence MR imaging of the abdomen was performed both before and after the administration  of intravenous contrast. CONTRAST:  59mL MULTIHANCE GADOBENATE DIMEGLUMINE 529 MG/ML IV SOLN COMPARISON:  Abdominopelvic CT 12/28/2016.  Abdominal CT 03/27/2008. FINDINGS: Lower chest: Patchy airspace opacities are present at both lung bases. Although suboptimally evaluated by MRI, these appear slightly worse than on recent CT and are suspicious for pneumonia. No significant pleural effusion. Hepatobiliary: The liver is normal in signal and demonstrates no focal lesion or abnormal enhancement. Multiple small gallstones are present. No gallbladder wall thickening, surrounding inflammation or biliary dilatation. Pancreas: Unremarkable. No pancreatic ductal dilatation or surrounding inflammatory changes. Spleen: Normal in size without focal abnormality. Adrenals/Urinary Tract: Both adrenal glands appear normal. Both kidneys demonstrate multiple sinus cysts. The lesion of concern in the lower pole of the right kidney measures 2.9 x 2.7 x 3.3 cm. This lesion demonstrates heterogeneous, predominantly high T1 and low T2 signal. There is no suspicious enhancement of this lesion following contrast, confirmed with subtracted images. There are thin septations which do mildly enhance. There is no suspicious renal mass or hydronephrosis. Stomach/Bowel: No evidence of bowel wall thickening, distention or surrounding inflammatory change. Vascular/Lymphatic: There are no enlarged abdominal lymph nodes. No significant vascular findings. Aortic atherosclerosis, better seen on CT. There is a retroaortic left renal vein. The renal veins and IVC otherwise appear unremarkable. Other:  No ascites. Musculoskeletal: No acute or significant osseous findings. IMPRESSION: 1. 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 Follow-up MRI in 6-12 months recommended. This recommendation follows ACR consensus guidelines: Management of the Incidental Renal Mass on CT: A White Paper of the ACR Incidental Findings Committee. J Am Coll Radiol 2018;15:264-273. 2. Renal sinus cysts bilaterally. 3. Cholelithiasis without evidence of cholecystitis or biliary dilatation. 4. Patchy airspace opacities at both lung bases, suspicious for pneumonia. These appear slightly worse than on the CT done 4 days ago. Chest radiographic follow up recommended. Electronically Signed   By: Richardean Sale M.D.   On: 11/01/2017 15:50    Scheduled Meds: . [MAR Hold] amLODipine  10 mg Oral Daily  . [MAR Hold] aspirin  81 mg Oral Daily  . [MAR Hold] atorvastatin  80 mg Oral Daily  . [MAR Hold] benzonatate  200 mg Oral TID  . [MAR Hold] dextromethorphan-guaiFENesin  1 tablet Oral BID  . [MAR Hold] docusate sodium  100 mg Oral BID  . [MAR Hold] fluticasone  1 spray Each Nare BID  . [MAR Hold] furosemide  20 mg Oral Daily  . [MAR Hold] levofloxacin  500 mg Oral Daily  . [MAR Hold] levothyroxine  50 mcg Oral QAC breakfast  . [MAR Hold] loratadine  10 mg Oral Daily  . [MAR Hold] metoprolol tartrate  12.5 mg Oral BID  . [MAR Hold] nystatin   Topical TID  . [MAR Hold] pantoprazole  40 mg Oral BID  . [MAR Hold] polyethylene glycol  17 g Oral Daily  . [MAR Hold] potassium chloride  40 mEq Oral Once  . [MAR Hold] predniSONE  40 mg Oral Q breakfast  . [MAR Hold] protein supplement shake  11 oz Oral BID BM  . sodium chloride flush  3 mL Intravenous Q12H  . [MAR Hold] venlafaxine XR  75 mg Oral Q breakfast   Continuous Infusions: . sodium chloride    . sodium chloride 1 mL/kg/hr  (11/02/17 0600)  . heparin Stopped (11/02/17 0945)  . heparin  Time spent: 30 minutes   [facility] Pager [phone] . If 7PM-7AM, please contact night-coverage at [email or web address], password [facility] 11/02/2017, 11:06 AM  LOS: 5 days

## 2017-11-02 NOTE — Interval H&P Note (Signed)
History and Physical Interval Note:  11/02/2017 10:13 AM  Erica Morris  has presented today for cardiac cath with the diagnosis of nstemi.  The various methods of treatment have been discussed with the patient and family. After consideration of risks, benefits and other options for treatment, the patient has consented to  Procedure(s): LEFT HEART CATH AND CORONARY ANGIOGRAPHY (N/A) as a surgical intervention .  The patient's history has been reviewed, patient examined, no change in status, stable for surgery.  I have reviewed the patient's chart and labs.  Questions were answered to the patient's satisfaction.    Cath Lab Visit (complete for each Cath Lab visit)  Clinical Evaluation Leading to the Procedure:   ACS: Yes.    Non-ACS:    Anginal Classification: CCS III  Anti-ischemic medical therapy: Minimal Therapy (1 class of medications)  Non-Invasive Test Results: No non-invasive testing performed  Prior CABG: No previous CABG        Erica Morris

## 2017-11-03 ENCOUNTER — Ambulatory Visit: Payer: Medicare Other | Admitting: Family Medicine

## 2017-11-03 LAB — BASIC METABOLIC PANEL
Anion gap: 9 (ref 5–15)
BUN: 19 mg/dL (ref 6–20)
CO2: 29 mmol/L (ref 22–32)
Calcium: 9.1 mg/dL (ref 8.9–10.3)
Chloride: 100 mmol/L — ABNORMAL LOW (ref 101–111)
Creatinine, Ser: 0.97 mg/dL (ref 0.44–1.00)
GFR calc Af Amer: >60 mL/min (ref >60)
GFR calc non Af Amer: 58 mL/min — ABNORMAL LOW (ref >60)
Glucose, Bld: 104 mg/dL — ABNORMAL HIGH (ref 65–99)
Potassium: 3.8 mmol/L (ref 3.5–5.1)
Sodium: 138 mmol/L (ref 135–145)

## 2017-11-03 LAB — CBC
HCT: 38.8 % (ref 36.0–46.0)
Hemoglobin: 12.4 g/dL (ref 12.0–15.0)
MCH: 26.1 pg (ref 26.0–34.0)
MCHC: 32 g/dL (ref 30.0–36.0)
MCV: 81.7 fL (ref 78.0–100.0)
Platelets: 350 10*3/uL (ref 150–400)
RBC: 4.75 MIL/uL (ref 3.87–5.11)
RDW: 13.7 % (ref 11.5–15.5)
WBC: 12.8 10*3/uL — ABNORMAL HIGH (ref 4.0–10.5)

## 2017-11-03 MED ORDER — FLUTICASONE PROPIONATE 50 MCG/ACT NA SUSP: 1.0000 | Freq: Two times a day (BID) | NASAL | 2 refills | Status: DC

## 2017-11-03 MED ORDER — METOPROLOL TARTRATE 25 MG PO TABS: 12.5000 mg | ORAL_TABLET | Freq: Two times a day (BID) | ORAL | 0 refills | Status: DC

## 2017-11-03 MED ORDER — LORATADINE 10 MG PO TABS: 10.0000 mg | ORAL_TABLET | Freq: Every day | ORAL | 0 refills | Status: DC

## 2017-11-03 MED ORDER — PREDNISONE 10 MG PO TABS: 20.0000 mg | ORAL_TABLET | Freq: Every day | ORAL | 0 refills | Status: DC

## 2017-11-03 MED ORDER — BENZONATATE 200 MG PO CAPS: 200.0000 mg | ORAL_CAPSULE | Freq: Three times a day (TID) | ORAL | 0 refills | Status: DC

## 2017-11-03 MED ORDER — POLYETHYLENE GLYCOL 3350 17 G PO PACK: 17.0000 g | PACK | Freq: Every day | ORAL | 0 refills | Status: DC | PRN

## 2017-11-04 ENCOUNTER — Ambulatory Visit (INDEPENDENT_AMBULATORY_CARE_PROVIDER_SITE_OTHER): Payer: Medicare Other | Admitting: Licensed Clinical Social Worker

## 2017-11-04 ENCOUNTER — Other Ambulatory Visit: Payer: Self-pay

## 2017-11-04 ENCOUNTER — Encounter: Payer: Self-pay | Admitting: Family Medicine

## 2017-11-04 ENCOUNTER — Ambulatory Visit (INDEPENDENT_AMBULATORY_CARE_PROVIDER_SITE_OTHER): Payer: Medicare Other | Admitting: Family Medicine

## 2017-11-04 VITALS — BP 144/76 | HR 57 | Temp 97.9°F | Ht 64.0 in | Wt 191.8 lb

## 2017-11-04 DIAGNOSIS — I10 Essential (primary) hypertension: Secondary | ICD-10-CM

## 2017-11-04 DIAGNOSIS — R748 Abnormal levels of other serum enzymes: Secondary | ICD-10-CM

## 2017-11-04 DIAGNOSIS — Z23 Encounter for immunization: Secondary | ICD-10-CM | POA: Diagnosis not present

## 2017-11-04 DIAGNOSIS — F411 Generalized anxiety disorder: Secondary | ICD-10-CM

## 2017-11-04 DIAGNOSIS — S80811A Abrasion, right lower leg, initial encounter: Secondary | ICD-10-CM

## 2017-11-04 DIAGNOSIS — R7989 Other specified abnormal findings of blood chemistry: Secondary | ICD-10-CM

## 2017-11-04 DIAGNOSIS — R778 Other specified abnormalities of plasma proteins: Secondary | ICD-10-CM

## 2017-11-04 MED ORDER — BUSPIRONE HCL 10 MG PO TABS: 10.0000 mg | ORAL_TABLET | Freq: Three times a day (TID) | ORAL | 1 refills | Status: DC

## 2017-11-04 NOTE — Progress Notes (Signed)
Type of Service: [place] 3rd F/U visit Total time:30 minutes :  Interpretor:No.    Reason for follow-up: Continue brief intervention to assist patient with managing symptoms of anxiety. Patient was accompanied by [name] the first 5 min. of the visit.  Patient was recently discharged from the hospital.  Patient was calm pleasant and engaged in conversation.   Appearance:Neat ; Thought process: Coherent; Affect: Appropriate.   Patient reports  "Effexor is not working for me, I need a higher dose".  Patient indicates she has 3 to 4 panic attacks a day for the past 2 to 3 months.  Patient now lives with [name]. States she has her own room and likes living with them.   PHQ-9 indication of moderate depression and GAD indication of Severe anxiety Depression screen [facility] 2/9 11/04/2017  Decreased Interest 0  Down, Depressed, Hopeless 0  PHQ - 2 Score 0  Altered sleeping 3  Tired, decreased energy 1  Change in appetite 3  Feeling bad or failure about yourself  1  Trouble concentrating 3  Moving slowly or fidgety/restless 3  Suicidal thoughts 0  PHQ-9 Score 14  Difficult doing work/chores Somewhat difficult   GAD 7 : Generalized Anxiety Score 11/04/2017 10/07/2017  Nervous, Anxious, on Edge 3 3  Control/stop worrying 3 3  Worry too much - different things 3 2  Trouble relaxing 3 3  Restless 3 3  Easily annoyed or irritable 3 3  Afraid - awful might happen 3 3  Total GAD 7 Score 21 20  Anxiety Difficulty Extremely difficult -   GOALS: Patient will reduce symptoms of: anxiety and depression , and increase  ability KD:TOIZTI skills and self-management skills, . Intervention: Solution-Focused Strategies, Mindfulness or Relaxation Training and Supportive Counseling,       Issues discussed: safety and transition to son's home ; coping skills ;  Demonstration of relaxed breathing, schedule hospital F/U appointment with PCP and  concerns with medication not helping with symptoms. Patient has decided to wait on the Circle until she gets her anxiety under control. ASSESSMENT:Patient continues to experience symptoms of depression and several anxiety.  Symptoms exacerbated by living situation at daughter's home.  Patient is now living with her son and daughter-in-law with less stress.  Patient would like to talk to PCP about making adjustments in her "nerve" medication as the medication is not working and does not help with her anxiety.   PLAN: update sent to PCP. 1. Patient will F/U with PCP to discuss concerns with medication  2. Behavioral recommendations: continue relaxed breathing 3. Referral:  None at this time  Erica Lanius, LCSW Licensed Clinical Social Worker [place]   [phone] 10:18 AM

## 2017-11-04 NOTE — Patient Instructions (Signed)
Stay on all of your same medications.   I added one new medication for anxiety - buspirone. See me in one month to see if the new medicine is doing well for you.   You will get a tetanus shot today.

## 2017-11-05 ENCOUNTER — Encounter: Payer: Self-pay | Admitting: Family Medicine

## 2017-11-05 DIAGNOSIS — S80811A Abrasion, right lower leg, initial encounter: Secondary | ICD-10-CM | POA: Insufficient documentation

## 2017-11-05 NOTE — Assessment & Plan Note (Signed)
Assume this was demand ischemia given normal cath.  Brought on by? Anxiety? Hypoxia and resp illness?  No change in current meds.

## 2017-11-05 NOTE — Progress Notes (Signed)
   Subjective:    Patient ID: Erica Morris, female    DOB: May 05, 1946, 71 y.o.   MRN: [id number]  HPI  FU hospitalization.  Patient had shortness of breath, cough and chest pain that brought her to [facility].  She was admitted with trop elevated - NSTMI.  Cards was involved, she was cathed and found to have normal coronary arteries and normal EF.  DC summary not available.  Denies CP now. Now her main complain is anxiety.  She still feels a bit "shortwinded."  Home BP was slightly elevated.  Now on metoprolol for BP Has an abrasion to her right leg     Review of Systems     Objective:   Physical Exam Lungs clear, no rales or wheeze. Cardiac RRR without m or G Ext no edema.  Abrasion right anterior shin.       Assessment & Plan:

## 2017-11-05 NOTE — Assessment & Plan Note (Signed)
Unclear if this contributed to her demand ischemia.  High risk for benzo use.  Will add buspar.

## 2017-11-05 NOTE — Assessment & Plan Note (Signed)
No changes in current meds.  Monitor.  If BP remains elevated, will need additional Rx (bump metoprolol dose.)

## 2017-11-05 NOTE — Assessment & Plan Note (Signed)
Healing OK.  Update tetanus.

## 2017-11-09 ENCOUNTER — Telehealth: Payer: Self-pay | Admitting: Family Medicine

## 2017-11-09 NOTE — Telephone Encounter (Signed)
Pt was sent home with tessalon pills--20 count- and rantidine -10 count.  Neither has refills. If she is to continue to take them, please call them into [facility].  Please let pt know if they will be refilled.

## 2017-11-09 NOTE — Discharge Summary (Signed)
Physician Discharge Summary  Erica Morris EXH:[id number] DOB: [birth year] DOA: 10/28/2017  PCP: Zenia Resides, MD  Admit date: 10/28/2017 Discharge date: 12/ 03/2017  Admitted From: Home.  Disposition:  Home.   Recommendations for Outpatient Follow-up:  1. Follow up with PCP in 1-2 weeks 2. Please obtain BMP/CBC in one week Please follow up with cardiology as needed for CHF.   Discharge Condition:STABLE.  CODE STATUS: full code.  Diet recommendation: Heart Healthy    Brief/Interim Summary: 71 y.o.femalewith medical history significant ofhypertension, hyperlipidemia, stroke, GERD, hypothyroidism,dCHF, who presents with cough, shortness breath, chest pain, abdominal pain, rashes. Patient states that she has been having dry cough and shortness of breath for almost 3 weeks, which has worsened in the past several days. Patient also has pleuritic chest pain. The chest pain is located in the left side of chest, constant, sharp, 10 out of 10 in severity, aggravated by coughing. Patient has chills, but no fever. Patient states that she has nausea, but no vomiting or diarrhea. She has abdominal pain, which is located in the central abdomen, constant, 10 out of 10 in severity, sharp.Herabdominal pain has been going on for about 1 week. Patient states she has mild dysuria, but no burning on urination or increased urinary frequency. Patient also has rashes in right leg and neck area, which has been going on for almost a month.Pt was seen by PCP on 11/27 for cough and SOB, wasstarted with azithromycin and prednisone for possible bronchitis, without significant improvement.     Discharge Diagnoses:  Principal Problem:   Chest pain Active Problems:   Essential hypertension   GERD   Abdominal pain   Pure hypercholesterolemia   History of CVA (cerebrovascular accident)   Hypothyroidism   Diastolic CHF (HCC)   Cough   NSTEMI (non-ST elevated myocardial infarction) (HCC)   Rash    Acute bronchitis   Elevated troponin  -chest pain: With obesity, hypertension, hyperlipidemia as risk factors for ACS. -She has not had any stratification in the past -Troponins elevated with some diffuse ST depression changes at the moment of admission.   meeting criteria for NSTEMI -2D echo reassuring: Preserved ejection fraction and no wall motion abnormalities. -After discussing with cardiology plan is to pursued left heart cath on 12/4; cardiac cath shows no evidence of CAD.  for now continue medical management. -Continue aspirin and statins;  2-chronic diastolic heart failure: Compensated. -No abnormality seen or describe on current echo regarding diastolic dysfunction. -Condition appears to be compensated -Patient echo demonstrated preserved ejection fraction -Continue daily weights and strict intake and output -Continue heart healthy diet and continue current dose of Lasix.    3-acute bronchitis in the setting of chronic COPD -No wheezing appreciated, good air movement bilaterally -Continue to experience coughing spells  -Will continue slow steroids tapering, continue current antibiotics as listed below, continue the use of antitussive and as needed nebulizer treatment.   4-UTI: With Enterococcus faecalis and a streptococcal Gllolyticus isolated -no dysuria -no fever Completed the course of antibiotics.   5-hypokalemia -repleted Intermittently follow electrolytes and further replete as needed.  6-hypertension -well controlled -continue current antihypertensive regimen    7-hyperlipidemia -LDL 94 -continue lipitor  8-morbid obesity: class 2 -Body mass index is 34.17 kg/m. -low calorie diet and increase physical activity has been discussed with patient  9-bilateral groin fungal dermatitis. -significant improvement; denies itching now -has completed 5 days of diflucan.  -advise to keep skin clean and dry.  10-GERD -continue PPI  11-allergic  rhinitis/nasal congestion and postnasal drip -Continue Flonase (dose changed to twice a day for better control of rhinitis) and continue loratadine.  -Still with symptoms of nasal congestion and postnasal drip; but improved.Marland Kitchen    12-Renal mass -CT scan with abnormalities and concerns for RCC. -no renal cell carcinoma seen on MRI -patient with hemorrhagic cyst. -no further work up needed    Discharge Instructions  Discharge Instructions    Diet - low sodium heart healthy   Complete by:  As directed    Discharge instructions   Complete by:  As directed    Please follow up with PCP in one week.     Allergies as of 11/03/2017      Reactions   Lisinopril Cough   Codeine Phosphate Itching   Morphine And Related Itching   Burning sensation and itchiness with IV morphine      Medication List    STOP taking these medications   azithromycin 250 MG tablet Commonly known as:  ZITHROMAX     TAKE these medications   AEROCHAMBER PLUS inhaler Use as instructed   albuterol 108 (90 Base) MCG/ACT inhaler Commonly known as:  PROVENTIL HFA;VENTOLIN HFA Inhale 2 puffs into the lungs every 4 (four) hours as needed for wheezing or shortness of breath.   aspirin EC 81 MG tablet Take 81 mg by mouth every morning.   atorvastatin 40 MG tablet Commonly known as:  LIPITOR Take 1 tablet (40 mg total) by mouth daily.   benzonatate 200 MG capsule Commonly known as:  TESSALON Take 1 capsule (200 mg total) by mouth 3 (three) times daily.   fluticasone 50 MCG/ACT nasal spray Commonly known as:  FLONASE Place 1 spray into both nostrils 2 (two) times daily.   furosemide 20 MG tablet Commonly known as:  LASIX Take 1 tablet (20 mg total) by mouth daily.   levothyroxine 50 MCG tablet Commonly known as:  SYNTHROID, LEVOTHROID Take 1 tablet (50 mcg total) daily before breakfast by mouth.   loratadine 10 MG tablet Commonly known as:  CLARITIN Take 1 tablet (10 mg total) by mouth daily.    metoprolol tartrate 25 MG tablet Commonly known as:  LOPRESSOR Take 0.5 tablets (12.5 mg total) by mouth 2 (two) times daily. What changed:  how much to take   pantoprazole 40 MG tablet Commonly known as:  PROTONIX TAKE 1 TABLET(40 MG) BY MOUTH DAILY   polyethylene glycol packet Commonly known as:  MIRALAX / GLYCOLAX Take 17 g by mouth daily as needed.   predniSONE 10 MG tablet Commonly known as:  DELTASONE Take 2 tablets (20 mg total) by mouth daily with breakfast. What changed:    medication strength  how much to take   venlafaxine XR 75 MG 24 hr capsule Commonly known as:  EFFEXOR XR Take 1 capsule (75 mg total) daily with breakfast by mouth.       Allergies  Allergen Reactions  . Lisinopril Cough  . Codeine Phosphate Itching  . Morphine And Related Itching    Burning sensation and itchiness with IV morphine    Consultations:  Cardiology.    Procedures/Studies: Dg Chest 2 View  Result Date: 10/28/2017 CLINICAL DATA:  Productive cough. Chest pain and shortness of breath. EXAM: CHEST  2 VIEW COMPARISON:  Radiograph 10/03/2017 FINDINGS: The cardiomediastinal contours are normal. Aortic arch atherosclerosis. Pulmonary vasculature is normal. No consolidation, pleural effusion, or pneumothorax. No acute osseous abnormalities are seen. IMPRESSION: No acute abnormality or change from prior exam. Electronically  Signed   By: Jeb Levering M.D.   On: 10/28/2017 03:28   Ct Head Wo Contrast  Result Date: 10/13/2017 CLINICAL DATA:  Acute onset of headache and dizziness. EXAM: CT HEAD WITHOUT CONTRAST TECHNIQUE: Contiguous axial images were obtained from the base of the skull through the vertex without intravenous contrast. COMPARISON:  CT of the head performed 08/08/2017, and MRI of the brain performed 03/23/2016 FINDINGS: Brain: No evidence of acute infarction, hemorrhage, hydrocephalus, extra-axial collection or mass lesion/mass effect. Scattered periventricular and  subcortical white matter change likely reflects small vessel ischemic microangiopathy. The posterior fossa, including the cerebellum, brainstem and fourth ventricle, is within normal limits. The third and lateral ventricles, and basal ganglia are unremarkable in appearance. The cerebral hemispheres are symmetric in appearance, with normal gray-white differentiation. No mass effect or midline shift is seen. Vascular: No hyperdense vessel or unexpected calcification. Skull: There is no evidence of fracture; visualized osseous structures are unremarkable in appearance. Sinuses/Orbits: The visualized portions of the orbits are within normal limits. The paranasal sinuses and mastoid air cells are well-aerated. Other: No significant soft tissue abnormalities are seen. IMPRESSION: 1. No acute intracranial pathology seen on CT. 2. Scattered small vessel ischemic microangiopathy. Electronically Signed   By: Garald Balding M.D.   On: 10/13/2017 04:35   Mr Abdomen W Wo Contrast  Result Date: 11/01/2017 CLINICAL DATA:  Right renal mass. Suspected renal cell carcinoma. No history of malignancy. EXAM: MRI ABDOMEN WITHOUT AND WITH CONTRAST TECHNIQUE: Multiplanar multisequence MR imaging of the abdomen was performed both before and after the administration of intravenous contrast. CONTRAST:  10mL MULTIHANCE GADOBENATE DIMEGLUMINE 529 MG/ML IV SOLN COMPARISON:  Abdominopelvic CT 12/28/2016.  Abdominal CT 03/27/2008. FINDINGS: Lower chest: Patchy airspace opacities are present at both lung bases. Although suboptimally evaluated by MRI, these appear slightly worse than on recent CT and are suspicious for pneumonia. No significant pleural effusion. Hepatobiliary: The liver is normal in signal and demonstrates no focal lesion or abnormal enhancement. Multiple small gallstones are present. No gallbladder wall thickening, surrounding inflammation or biliary dilatation. Pancreas: Unremarkable. No pancreatic ductal dilatation or  surrounding inflammatory changes. Spleen: Normal in size without focal abnormality. Adrenals/Urinary Tract: Both adrenal glands appear normal. Both kidneys demonstrate multiple sinus cysts. The lesion of concern in the lower pole of the right kidney measures 2.9 x 2.7 x 3.3 cm. This lesion demonstrates heterogeneous, predominantly high T1 and low T2 signal. There is no suspicious enhancement of this lesion following contrast, confirmed with subtracted images. There are thin septations which do mildly enhance. There is no suspicious renal mass or hydronephrosis. Stomach/Bowel: No evidence of bowel wall thickening, distention or surrounding inflammatory change. Vascular/Lymphatic: There are no enlarged abdominal lymph nodes. No significant vascular findings. Aortic atherosclerosis, better seen on CT. There is a retroaortic left renal vein. The renal veins and IVC otherwise appear unremarkable. Other: No ascites. Musculoskeletal: No acute or significant osseous findings. IMPRESSION: 1. 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 Follow-up MRI in 6-12 months recommended. This recommendation follows ACR consensus guidelines: Management of the Incidental Renal Mass on CT: A White Paper of the ACR Incidental Findings Committee. J Am Coll Radiol 2018;15:264-273. 2. Renal sinus cysts bilaterally. 3. Cholelithiasis without evidence of cholecystitis or biliary dilatation. 4. Patchy airspace opacities at both lung bases, suspicious for pneumonia. These appear slightly worse than on the CT  done 4 days ago. Chest radiographic follow up recommended. Electronically Signed   By: Richardean Sale M.D.   On: 11/01/2017 15:50   Ct Abdomen Pelvis W Contrast  Result Date: 10/28/2017 CLINICAL DATA:  Diffuse abdominal pain and fever. Clinical suspicion for abscess. EXAM: CT ABDOMEN AND  PELVIS WITH CONTRAST TECHNIQUE: Multidetector CT imaging of the abdomen and pelvis was performed using the standard protocol following bolus administration of intravenous contrast. CONTRAST:  115mL ISOVUE-300 IOPAMIDOL (ISOVUE-300) INJECTION 61% COMPARISON:  03/27/2008 FINDINGS: Lower Chest: Mild patchy airspace opacity is seen in the posterior right lower lobe, suspicious for pneumonia. Hepatobiliary: No hepatic masses identified. A few sub-cm calcified gallstones are seen, however there is no evidence of cholecystitis or biliary ductal dilatation. Pancreas:  No mass or inflammatory changes. Spleen: Within normal limits in size and appearance. Adrenals/Urinary Tract: Normal adrenal glands. Bilateral renal sinus cysts are again seen, without evidence of hydronephrosis. A new lesion is seen in the posterior lower pole of the right kidney which shows evidence of mild contrast washout delayed imaging, suspicious for a solid renal neoplasm. This measures 3.3 x 2.8 cm. No other renal masses are identified. Unremarkable unopacified urinary bladder. Stomach/Bowel: No evidence of obstruction, inflammatory process or abnormal fluid collections. Mild diverticulosis involving the proximal sigmoid colon, without evidence of diverticulitis. Vascular/Lymphatic: No pathologically enlarged lymph nodes. No abdominal aortic aneurysm. Aortic atherosclerosis. Reproductive: Prior hysterectomy noted. Adnexal regions are unremarkable in appearance. Other:  None. Musculoskeletal:  No suspicious bone lesions identified. IMPRESSION: No evidence of abdominal or pelvic abscess or acute inflammatory process. 3.3 cm lesion in lower pole of right kidney, suspicious for solid renal neoplasm. Recommend abdomen MRI without and with contrast for further characterization. No evidence of metastatic disease. Mild airspace opacity in posterior right lower lobe, suspicious for pneumonia. Cholelithiasis.  No radiographic evidence of cholecystitis. Colonic  diverticulosis, without radiographic evidence of diverticulitis. Electronically Signed   By: Earle Gell M.D.   On: 10/28/2017 12:41       Subjective: No chest pain or sob.  No nausea, vomiting or abdominal pain.   Discharge Exam: Vitals:   11/03/17 0513 11/03/17 0843  BP: (!) 163/85 (!) 156/79  Pulse: 60 66  Resp: 18   Temp: 97.9 F (36.6 C)   SpO2: 98%    Vitals:   11/02/17 2031 11/02/17 2053 11/03/17 0513 11/03/17 0843  BP:  (!) 177/98 (!) 163/85 (!) 156/79  Pulse:  72 60 66  Resp:  18 18   Temp:  97.7 F (36.5 C) 97.9 F (36.6 C)   TempSrc:  Oral Oral   SpO2: 97% 98% 98%   Weight:      Height:        General: Pt is alert, awake, not in acute distress Cardiovascular: RRR, S1/S2 +, no rubs, no gallops Respiratory: CTA bilaterally, no wheezing, no rhonchi Abdominal: Soft, NT, ND, bowel sounds + Extremities: no edema, no cyanosis    The results of significant diagnostics from this hospitalization (including imaging, microbiology, ancillary and laboratory) are listed below for reference.     Microbiology: No results found for this or any previous visit (from the past 240 hour(s)).   Labs: BNP (last 3 results) Recent Labs    10/03/17 2104 10/28/17 0425  BNP 86.5 627.0*   Basic Metabolic Panel: Recent Labs  Lab 11/03/17 0410  NA 138  K 3.8  CL 100*  CO2 29  GLUCOSE 104*  BUN 19  CREATININE 0.97  CALCIUM 9.1  Liver Function Tests: No results for input(s): AST, ALT, ALKPHOS, BILITOT, PROT, ALBUMIN in the last 168 hours. No results for input(s): LIPASE, AMYLASE in the last 168 hours. No results for input(s): AMMONIA in the last 168 hours. CBC: Recent Labs  Lab 11/03/17 0410  WBC 12.8*  HGB 12.4  HCT 38.8  MCV 81.7  PLT 350   Cardiac Enzymes: No results for input(s): CKTOTAL, CKMB, CKMBINDEX, TROPONINI in the last 168 hours. BNP: Invalid input(s): POCBNP CBG: No results for input(s): GLUCAP in the last 168 hours. D-Dimer No results  for input(s): DDIMER in the last 72 hours. Hgb A1c No results for input(s): HGBA1C in the last 72 hours. Lipid Profile No results for input(s): CHOL, HDL, LDLCALC, TRIG, CHOLHDL, LDLDIRECT in the last 72 hours. Thyroid function studies No results for input(s): TSH, T4TOTAL, T3FREE, THYROIDAB in the last 72 hours.  Invalid input(s): FREET3 Anemia work up No results for input(s): VITAMINB12, FOLATE, FERRITIN, TIBC, IRON, RETICCTPCT in the last 72 hours. Urinalysis    Component Value Date/Time   COLORURINE AMBER (A) 10/28/2017 0650   APPEARANCEUR CLOUDY (A) 10/28/2017 0650   LABSPEC 1.026 10/28/2017 0650   PHURINE 5.0 10/28/2017 0650   GLUCOSEU NEGATIVE 10/28/2017 0650   HGBUR SMALL (A) 10/28/2017 0650   BILIRUBINUR SMALL (A) 10/28/2017 0650   BILIRUBINUR NEG 12/03/2016 1134   KETONESUR 20 (A) 10/28/2017 0650   PROTEINUR 100 (A) 10/28/2017 0650   UROBILINOGEN 0.2 12/03/2016 1134   UROBILINOGEN 0.2 06/19/2013 1351   NITRITE NEGATIVE 10/28/2017 0650   LEUKOCYTESUR LARGE (A) 10/28/2017 0650   Sepsis Labs Invalid input(s): PROCALCITONIN,  WBC,  LACTICIDVEN Microbiology No results found for this or any previous visit (from the past 240 hour(s)).   Time coordinating discharge: Over 30 minutes  SIGNED:   Hosie Poisson, MD  [facility] 11/09/2017, 9:11 AM Pager   If 7PM-7AM, please contact night-coverage [email or web address] Password [email or web address]

## 2017-11-10 MED ORDER — LORATADINE 10 MG PO TABS: 10.0000 mg | ORAL_TABLET | Freq: Every day | ORAL | 3 refills | Status: DC | PRN

## 2017-11-10 MED ORDER — BENZONATATE 200 MG PO CAPS: 200.0000 mg | ORAL_CAPSULE | Freq: Three times a day (TID) | ORAL | 2 refills | Status: DC | PRN

## 2017-11-10 NOTE — Telephone Encounter (Signed)
LVM for pt to call office back to inform her of below. Zimmerman Rumple, [name], CMA  

## 2017-11-10 NOTE — Telephone Encounter (Signed)
Pt daughter-n-law called back and I gave her the below message and she said that it is not the rantidine it is the loratidine (Claritin) that she was referencing to see if she should continue to take this or not. Told pt I would send to PCP to see if he needs to refill this as well. Katharina Caper, [name], [place]

## 2017-11-10 NOTE — Telephone Encounter (Signed)
I refilled the benzonatate.  She should take it only as needed for cough.  I did not refill the ranitidine.  By my records, she is already taking pantoprazole.  She does not need both medications.

## 2017-11-15 ENCOUNTER — Other Ambulatory Visit: Payer: Self-pay | Admitting: Family Medicine

## 2017-11-15 DIAGNOSIS — F411 Generalized anxiety disorder: Secondary | ICD-10-CM

## 2017-11-15 MED ORDER — VENLAFAXINE HCL ER 75 MG PO CP24: 75.0000 mg | ORAL_CAPSULE | Freq: Every day | ORAL | 3 refills | Status: DC

## 2017-11-16 ENCOUNTER — Emergency Department (HOSPITAL_COMMUNITY)
Admission: EM | Admit: 2017-11-16 | Discharge: 2017-11-16 | Disposition: A | Discharge status: Home / Self Care | Payer: Medicare Other | Attending: Emergency Medicine | Admitting: Emergency Medicine

## 2017-11-16 ENCOUNTER — Encounter (HOSPITAL_COMMUNITY): Payer: Self-pay

## 2017-11-16 ENCOUNTER — Telehealth: Payer: Self-pay | Admitting: *Deleted

## 2017-11-16 ENCOUNTER — Other Ambulatory Visit: Payer: Self-pay

## 2017-11-16 ENCOUNTER — Emergency Department (HOSPITAL_COMMUNITY): Payer: Medicare Other

## 2017-11-16 DIAGNOSIS — Z5321 Procedure and treatment not carried out due to patient leaving prior to being seen by health care provider: Secondary | ICD-10-CM | POA: Diagnosis not present

## 2017-11-16 DIAGNOSIS — R079 Chest pain, unspecified: Secondary | ICD-10-CM | POA: Diagnosis not present

## 2017-11-16 LAB — BASIC METABOLIC PANEL
Anion gap: 6 (ref 5–15)
BUN: 12 mg/dL (ref 6–20)
CO2: 23 mmol/L (ref 22–32)
Calcium: 8.9 mg/dL (ref 8.9–10.3)
Chloride: 110 mmol/L (ref 101–111)
Creatinine, Ser: 0.91 mg/dL (ref 0.44–1.00)
GFR calc Af Amer: >60 mL/min (ref >60)
GFR calc non Af Amer: >60 mL/min (ref >60)
Glucose, Bld: 126 mg/dL — ABNORMAL HIGH (ref 65–99)
Potassium: 3.4 mmol/L — ABNORMAL LOW (ref 3.5–5.1)
Sodium: 139 mmol/L (ref 135–145)

## 2017-11-16 LAB — CBC
HCT: 36.9 % (ref 36.0–46.0)
Hemoglobin: 11.5 g/dL — ABNORMAL LOW (ref 12.0–15.0)
MCH: 25.8 pg — ABNORMAL LOW (ref 26.0–34.0)
MCHC: 31.2 g/dL (ref 30.0–36.0)
MCV: 82.7 fL (ref 78.0–100.0)
Platelets: 208 10*3/uL (ref 150–400)
RBC: 4.46 MIL/uL (ref 3.87–5.11)
RDW: 14.2 % (ref 11.5–15.5)
WBC: 8 10*3/uL (ref 4.0–10.5)

## 2017-11-16 LAB — I-STAT TROPONIN, ED: Troponin i, poc: 0 ng/mL (ref 0.00–0.08)

## 2017-11-16 NOTE — ED Triage Notes (Signed)
Pt arrives via EMS with complaints of chest tightness in center of chest that radiates to left arm. PT endorses sob at that time. Symptoms all began after argument with family. Pt treated for bronchitis ~ 1 week ago.   5mg  albuterol given PTA. 324mg  ASA and 1 SL NTG also given pta.   #20 LAC 154/80  Hr 60  rr 20 spo2 100% RA

## 2017-11-16 NOTE — Telephone Encounter (Signed)
Daughter-in-law left message on nurse line stating patient was having CP, SHOB and arm numbness. Returned call and EMS was already at patient's home. Hubbard Hartshorn, RN, BSN

## 2017-11-16 NOTE — ED Notes (Signed)
Daughter up to front asking how much longer for her mom to get room. Daughter explained that mom has panic attacks and last panic attack caused a heart attack. She was at [facility] 3 weeks ago for heart attack.  Daughter says that mom's CP is still present. Offered reassurance, reviewed labs.

## 2017-11-16 NOTE — Telephone Encounter (Signed)
Noted and agree with EMS evaluation.  Patient is difficult because both she and daughter embellish somatic complaints.  And at 39 with her co morbid diseases, she is at risk for important problems.

## 2017-11-17 ENCOUNTER — Telehealth: Payer: Self-pay | Admitting: Family Medicine

## 2017-11-17 NOTE — Telephone Encounter (Signed)
Called and told OK to take Buspar 4x/day.  Asked to make an appointment to see me to discuss further.   Sadly, there are no easy answers for this patient.

## 2017-11-17 NOTE — Telephone Encounter (Signed)
Pt would like for her nerve pills (buspar) to be stronger. They dont seem to be helping. She went to ED last night because she was having chest pains but she didn't stay because the wait was too long.

## 2017-11-18 ENCOUNTER — Ambulatory Visit: Payer: Medicare Other | Admitting: Family Medicine

## 2017-11-18 ENCOUNTER — Emergency Department (HOSPITAL_COMMUNITY): Payer: Medicare Other

## 2017-11-18 ENCOUNTER — Emergency Department (HOSPITAL_COMMUNITY)
Admission: EM | Admit: 2017-11-18 | Discharge: 2017-11-19 | Disposition: A | Discharge status: Home / Self Care | Payer: Medicare Other | Attending: Emergency Medicine | Admitting: Emergency Medicine

## 2017-11-18 ENCOUNTER — Encounter (HOSPITAL_COMMUNITY): Payer: Self-pay

## 2017-11-18 ENCOUNTER — Telehealth: Payer: Self-pay | Admitting: Family Medicine

## 2017-11-18 DIAGNOSIS — Z79899 Other long term (current) drug therapy: Secondary | ICD-10-CM | POA: Diagnosis not present

## 2017-11-18 DIAGNOSIS — F419 Anxiety disorder, unspecified: Secondary | ICD-10-CM | POA: Insufficient documentation

## 2017-11-18 DIAGNOSIS — R079 Chest pain, unspecified: Secondary | ICD-10-CM

## 2017-11-18 DIAGNOSIS — Z8673 Personal history of transient ischemic attack (TIA), and cerebral infarction without residual deficits: Secondary | ICD-10-CM | POA: Diagnosis not present

## 2017-11-18 DIAGNOSIS — Z7982 Long term (current) use of aspirin: Secondary | ICD-10-CM | POA: Diagnosis not present

## 2017-11-18 DIAGNOSIS — R0602 Shortness of breath: Secondary | ICD-10-CM | POA: Insufficient documentation

## 2017-11-18 DIAGNOSIS — F329 Major depressive disorder, single episode, unspecified: Secondary | ICD-10-CM | POA: Diagnosis not present

## 2017-11-18 DIAGNOSIS — I5030 Unspecified diastolic (congestive) heart failure: Secondary | ICD-10-CM | POA: Insufficient documentation

## 2017-11-18 DIAGNOSIS — E039 Hypothyroidism, unspecified: Secondary | ICD-10-CM | POA: Diagnosis not present

## 2017-11-18 DIAGNOSIS — I110 Hypertensive heart disease with heart failure: Secondary | ICD-10-CM | POA: Insufficient documentation

## 2017-11-18 DIAGNOSIS — R51 Headache: Secondary | ICD-10-CM | POA: Diagnosis not present

## 2017-11-18 DIAGNOSIS — I252 Old myocardial infarction: Secondary | ICD-10-CM | POA: Diagnosis not present

## 2017-11-18 LAB — BASIC METABOLIC PANEL
Anion gap: 8 (ref 5–15)
BUN: 9 mg/dL (ref 6–20)
CO2: 25 mmol/L (ref 22–32)
Calcium: 8.7 mg/dL — ABNORMAL LOW (ref 8.9–10.3)
Chloride: 108 mmol/L (ref 101–111)
Creatinine, Ser: 0.9 mg/dL (ref 0.44–1.00)
GFR calc Af Amer: >60 mL/min (ref >60)
GFR calc non Af Amer: >60 mL/min (ref >60)
Glucose, Bld: 99 mg/dL (ref 65–99)
Potassium: 3.3 mmol/L — ABNORMAL LOW (ref 3.5–5.1)
Sodium: 141 mmol/L (ref 135–145)

## 2017-11-18 LAB — URINALYSIS, ROUTINE W REFLEX MICROSCOPIC
Bilirubin Urine: NEGATIVE
Glucose, UA: NEGATIVE mg/dL
Hgb urine dipstick: NEGATIVE
Ketones, ur: NEGATIVE mg/dL
Leukocytes, UA: NEGATIVE
Nitrite: NEGATIVE
Protein, ur: NEGATIVE mg/dL
Specific Gravity, Urine: <1.005 — ABNORMAL LOW (ref 1.005–1.030)
pH: 7 (ref 5.0–8.0)

## 2017-11-18 LAB — I-STAT TROPONIN, ED: Troponin i, poc: 0.02 ng/mL (ref 0.00–0.08)

## 2017-11-18 LAB — CBC
HCT: 35.2 % — ABNORMAL LOW (ref 36.0–46.0)
Hemoglobin: 11.2 g/dL — ABNORMAL LOW (ref 12.0–15.0)
MCH: 26.2 pg (ref 26.0–34.0)
MCHC: 31.8 g/dL (ref 30.0–36.0)
MCV: 82.2 fL (ref 78.0–100.0)
Platelets: 201 10*3/uL (ref 150–400)
RBC: 4.28 MIL/uL (ref 3.87–5.11)
RDW: 14 % (ref 11.5–15.5)
WBC: 8 10*3/uL (ref 4.0–10.5)

## 2017-11-18 LAB — LIPASE, BLOOD: Lipase: 20 U/L (ref 11–51)

## 2017-11-18 MED ORDER — DEXAMETHASONE SODIUM PHOSPHATE 10 MG/ML IJ SOLN
10.0000 mg | Freq: Once | INTRAMUSCULAR | Status: AC
  Administered 2017-11-18: 10 mg via INTRAVENOUS
  Filled 2017-11-18: qty 1

## 2017-11-18 MED ORDER — POTASSIUM CHLORIDE CRYS ER 20 MEQ PO TBCR
40.0000 meq | EXTENDED_RELEASE_TABLET | Freq: Once | ORAL | Status: AC
  Administered 2017-11-18: 40 meq via ORAL
  Filled 2017-11-18: qty 2

## 2017-11-18 MED ORDER — LORAZEPAM 2 MG/ML IJ SOLN: 1.0000 mg | Freq: Once | INTRAMUSCULAR | Status: DC

## 2017-11-18 MED ORDER — IPRATROPIUM-ALBUTEROL 0.5-2.5 (3) MG/3ML IN SOLN
3.0000 mL | RESPIRATORY_TRACT | Status: DC
  Administered 2017-11-18: 3 mL via RESPIRATORY_TRACT
  Filled 2017-11-18: qty 3

## 2017-11-18 MED ORDER — FENTANYL CITRATE (PF) 100 MCG/2ML IJ SOLN
50.0000 ug | Freq: Once | INTRAMUSCULAR | Status: AC
  Administered 2017-11-18: 50 ug via INTRAVENOUS
  Filled 2017-11-18: qty 2

## 2017-11-18 MED ORDER — LORAZEPAM 2 MG/ML IJ SOLN
0.5000 mg | Freq: Once | INTRAMUSCULAR | Status: AC
  Administered 2017-11-18: 0.5 mg via INTRAVENOUS
  Filled 2017-11-18: qty 1

## 2017-11-18 MED ORDER — METOCLOPRAMIDE HCL 5 MG/ML IJ SOLN
10.0000 mg | Freq: Once | INTRAMUSCULAR | Status: AC
  Administered 2017-11-18: 10 mg via INTRAVENOUS
  Filled 2017-11-18: qty 2

## 2017-11-18 MED ORDER — POTASSIUM CHLORIDE 10 MEQ/100ML IV SOLN
10.0000 meq | Freq: Once | INTRAVENOUS | Status: AC
  Administered 2017-11-18: 10 meq via INTRAVENOUS
  Filled 2017-11-18: qty 100

## 2017-11-18 MED ORDER — DIPHENHYDRAMINE HCL 50 MG/ML IJ SOLN
25.0000 mg | Freq: Once | INTRAMUSCULAR | Status: AC
  Administered 2017-11-18: 25 mg via INTRAVENOUS
  Filled 2017-11-18: qty 1

## 2017-11-18 MED ORDER — KETOROLAC TROMETHAMINE 30 MG/ML IJ SOLN
15.0000 mg | Freq: Once | INTRAMUSCULAR | Status: AC
  Administered 2017-11-18: 15 mg via INTRAVENOUS
  Filled 2017-11-18: qty 1

## 2017-11-18 NOTE — ED Notes (Signed)
Patient transported to CT and XRAY. 

## 2017-11-18 NOTE — ED Notes (Signed)
Returned from xray

## 2017-11-18 NOTE — Telephone Encounter (Signed)
Patient is not sure what to do.  She is shaky, short of breath and did not want to come in and wait in the waiting room.  Can you please call her asap at [phone].

## 2017-11-18 NOTE — ED Notes (Signed)
Patient transported to CT 

## 2017-11-18 NOTE — ED Triage Notes (Signed)
Pt from home with ems c.o sudden onset left sided chest pain 10/10 around 1400 today with SOB. Pt also c.o left shoulder pain and abd/umbilical pain. Pt had 2 episodes of diarrhea today, no n/v. Pt given 324 ASA and 2 Nitro without relief of pain. En route to ED pt also c.o left sided arm and leg tingling, no neuro deficits noted. Pt a.o upon arrival, nad.  BP 152/62  HR77 sinus rhythm

## 2017-11-18 NOTE — Telephone Encounter (Signed)
Called and spent 20 minutes on phone with patient and granddaughter.  I will reiterate my comment of yesterday's phone call: There are no easy answers for this patient.    Patient calls and says she needs to be in the hospital but cannot wait in the ER.  She wanted me to directly admit her.  I declined because she has a long history of somatization.  Current symptoms are: Nervousness and shaking. Pacing the floor. Mild hypertension (home BP reportedly 170/80) Short winded "My laryngitits is coming back." "My nose stays stuffed up and the nose spray is not working." No obvious dyspnea during conversation.  Speaks in full sentences.   Her daughter is currently on her way home from an appointment in [place].  She has been home alone with her granddaughter most of the day.  By the way, she went to the ER 2 nights ago and left without being seen. They made her want too long.  All signs point to a panic attack.  However, at age 71, a recent NSTMI (normal cath) and a complaint of dyspnea, I cannot be certain it is not something important is not happening.  I told the patient that her options were to go to the ER.  If she felt that she could wait, we could see her in the [facility] tomorrow.

## 2017-11-18 NOTE — ED Provider Notes (Signed)
Emergency Department Provider Note   I have reviewed the triage vital signs and the nursing notes.   HISTORY  Chief Complaint Chest Pain   HPI Erica Morris is a 71 y.o. female multiple medical problems as diagnosed below the presents to the emergency department with multiple complaints but most salient being that she had a worsening of her chest pain today.  She states the pain started this morning is left-sided radiates to her left shoulder associate with shortness of breath.  She states this is similar to when she had her heart attack in the past (that she had a end STEMI with a normal cath a couple weeks ago) and is worried her and brought her to the emergency department for evaluation.  Review of systems she also has epigastric abdominal pain, nausea, headache, shortness of breath, cough and decreased appetite.  No fevers or back pain.  No other associated modifying symptoms has not tried anything for her symptoms. On review of records the patient seems to have spoken to her doctor multiple times about these complaints over the last few days so I doubt that there is new that she is saying they are.  Daughter states that she has a history of anxiety however she did have this recent end STEMI and thought she need to be evaluated the emergency department.  Past Medical History:  Diagnosis Date  . ABDOMINAL PAIN, RECURRENT 03/29/2008  . ANXIETY 10/07/2007  . Candidiasis of mouth 05/30/2010  . GERD 07/25/2007  . Hernia, hiatal   . HYPERTENSION 07/25/2007  . Stroke [facility])     Patient Active Problem List   Diagnosis Date Noted  . Abrasion of anterior right lower leg 11/05/2017  . Elevated troponin   . Acute bronchitis   . Cough 10/28/2017  . Chest pain 10/28/2017  . NSTEMI (non-ST elevated myocardial infarction) ([place]) 10/28/2017  . Rash 10/28/2017  . Dry mouth 10/07/2017  . Nasal congestion 09/16/2017  . Vertigo 08/10/2017  . Candidiasis, intertrigo 04/02/2017  . Diastolic CHF  ([place]) 74/25/9563  . 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 Hypothyroidism 11/17/2016  . Episode of recurrent major depressive disorder ([place])   . History of CVA (cerebrovascular accident)   . Pure hypercholesterolemia 11/06/2016  . Chronic venous insufficiency 11/05/2016  . Peripheral neuropathy 11/05/2016  . Cerebrovascular disease 06/09/2016  . Orthostatic hypotension 06/09/2016  . Hypokalemia 02/20/2015  . History of colonic polyps 06/04/2014  . Depression 03/29/2014  . Chronic constipation 03/29/2014  . Abdominal pain 03/29/2008  . Anxiety state 10/07/2007  . Essential hypertension 07/25/2007  . GERD 07/25/2007    Past Surgical History:  Procedure Laterality Date  . ABDOMINAL HYSTERECTOMY    . BREAST SURGERY     bx  . KNEE ARTHROSCOPY    . LEFT HEART CATH AND CORONARY ANGIOGRAPHY N/A 11/02/2017   Procedure: LEFT HEART CATH AND CORONARY ANGIOGRAPHY;  Surgeon: Burnell Blanks, MD;  Location: [facility];  Service: Cardiovascular;  Laterality: N/A;    Current Outpatient Rx  . Order #: [id number] Class: Normal  . Order #: [id number] Class: Historical Med  . Order #: [id number] Class: Normal  . Order #: [id number] Class: Normal  . Order #: [id number] Class: Normal  . Order #: [id number] Class: Normal  . Order #: [id number] Class: Normal  . Order #: [id number] Class: Normal  . Order #: [id number] Class: Normal  . Order #: [id number] Class: Normal  . Order #: [id number] Class: Normal  . Order #: [id number] Class: Historical Med  . Order #:  [id number] Class: Normal  . Order #: [id number] Class: Normal  . Order #: [id number] Class: Normal    Allergies Lisinopril; Codeine phosphate; and Morphine and related  Family History  Problem Relation Age of Onset  . Dementia Mother   . Alcoholism Mother   . COPD Father   . Diabetes Sister   . Hypertension Sister   . Cancer Brother        Lung CA  . COPD Brother   . Clotting disorder Brother   . Kidney  disease Neg Hx   . Stroke Neg Hx     Social History Social History   Tobacco Use  . Smoking status: Never Smoker  . Smokeless tobacco: Never Used  Substance Use Topics  . Alcohol use: Yes    Comment: occasional  . Drug use: No    Review of Systems  All other systems negative except as documented in the HPI. All pertinent positives and negatives as reviewed in the HPI. ____________________________________________   PHYSICAL EXAM:  VITAL SIGNS: ED Triage Vitals  Enc Vitals Group     BP --      Pulse --      Resp --      Temp --      Temp src --      SpO2 11/18/17 1650 97 %     Weight 11/18/17 1655 191 lb (86.6 kg)     Height 11/18/17 1655 5\' 4"  (1.626 m)     Head Circumference --      Peak Flow --      Pain Score 11/18/17 1655 9     Pain Loc --      Pain Edu? --      Excl. in Okay? --     Constitutional: Alert and oriented. Well appearing and in no acute distress. Eyes: Conjunctivae are normal. PERRL. EOMI. Head: Atraumatic. Nose: No congestion/rhinnorhea. Mouth/Throat: Mucous membranes are moist.  Oropharynx non-erythematous. Neck: No stridor.  No meningeal signs.   Cardiovascular: Normal rate, regular rhythm. Good peripheral circulation. Grossly normal heart sounds.   Respiratory: Normal respiratory effort.  No retractions. Lungs CTAB. Gastrointestinal: Soft and mild diffuse tenderness. No distention.  Musculoskeletal: No lower extremity tenderness nor edema. No gross deformities of extremities. Neurologic:  Normal speech and language. No gross focal neurologic deficits are appreciated.  Skin:  Skin is warm, dry and intact. No rash noted.   ____________________________________________   LABS (all labs ordered are listed, but only abnormal results are displayed)  Labs Reviewed  BASIC METABOLIC PANEL - Abnormal; Notable for the following components:      Result Value   Potassium 3.3 (*)    Calcium 8.7 (*)    All other components within normal limits    CBC - Abnormal; Notable for the following components:   Hemoglobin 11.2 (*)    HCT 35.2 (*)    All other components within normal limits  URINALYSIS, ROUTINE W REFLEX MICROSCOPIC - Abnormal; Notable for the following components:   Specific Gravity, Urine <1.005 (*)    All other components within normal limits  LIPASE, BLOOD  TROPONIN I  I-STAT TROPONIN, ED   ____________________________________________  EKG   EKG Interpretation  Date/Time:  Thursday November 18 2017 16:55:13 EST Ventricular Rate:  63 PR Interval:    QRS Duration: 89 QT Interval:  437 QTC Calculation: 448 R Axis:   20 Text Interpretation:  Atrial fibrillation Minimal ST depression, anterolateral leads Confirmed by Merrily Pew [phone]) on 11/18/2017 5:06:57 PM  ____________________________________________  RADIOLOGY  Dg Chest 2 View  Result Date: 11/18/2017 CLINICAL DATA:  Acute chest pain and shortness of breath today. EXAM: CHEST  2 VIEW COMPARISON:  11/16/2017 and prior exams FINDINGS: Cardiomegaly noted. Mild peribronchial thickening is unchanged. There is no evidence of focal airspace disease, pulmonary edema, suspicious pulmonary nodule/mass, pleural effusion, or pneumothorax. No acute bony abnormalities are identified. IMPRESSION: Cardiomegaly without evidence of acute cardiopulmonary disease. Electronically Signed   By: Margarette Canada M.D.   On: 11/18/2017 17:31   Ct Head Wo Contrast  Result Date: 11/18/2017 CLINICAL DATA:  71 year old female with headache. EXAM: CT HEAD WITHOUT CONTRAST TECHNIQUE: Contiguous axial images were obtained from the base of the skull through the vertex without intravenous contrast. COMPARISON:  10/13/2017 CT and prior studies FINDINGS: Brain: No evidence of acute infarction, hemorrhage, hydrocephalus, extra-axial collection or mass lesion/mass effect. Chronic small-vessel white matter ischemic changes are again identified. Vascular: Atherosclerotic calcifications noted  Skull: Normal. Negative for fracture or focal lesion. Sinuses/Orbits: No acute finding. Other: None IMPRESSION: 1. No evidence of acute intracranial abnormality 2. Chronic small-vessel white matter ischemic changes. Electronically Signed   By: Margarette Canada M.D.   On: 11/18/2017 18:35    ____________________________________________   PROCEDURES  Procedure(s) performed:   Procedures   ____________________________________________   INITIAL IMPRESSION / ASSESSMENT AND PLAN / ED COURSE  High suspicion for anxiety and possible panic attack however she does have a history of having slight elevation of troponin so we will rule out for ACS.  Also has had this intermittently worsening headache over the last couple weeks we will get a head CT.  Otherwise a screening labs and will give her a headache cocktail along with a breathing treatment as it could all be related to bronchitis.  Workup negative. Pain improved. Stable for discharge with pcp follow up.   Pertinent labs & imaging results that were available during my care of the patient were reviewed by me and considered in my medical decision making (see chart for details). ____________________________________________  FINAL CLINICAL IMPRESSION(S) / ED DIAGNOSES  Final diagnoses:  Nonspecific chest pain    MEDICATIONS GIVEN DURING THIS VISIT:  Medications  ipratropium-albuterol (DUONEB) 0.5-2.5 (3) MG/3ML nebulizer solution 3 mL (3 mLs Nebulization Given 11/18/17 1823)  metoCLOPramide (REGLAN) injection 10 mg (10 mg Intravenous Given 11/18/17 1822)  diphenhydrAMINE (BENADRYL) injection 25 mg (25 mg Intravenous Given 11/18/17 1822)  dexamethasone (DECADRON) injection 10 mg (10 mg Intravenous Given 11/18/17 1823)  ketorolac (TORADOL) 30 MG/ML injection 15 mg (15 mg Intravenous Given 11/18/17 1823)  LORazepam (ATIVAN) injection 0.5 mg (0.5 mg Intravenous Given 11/18/17 1823)  potassium chloride SA (K-DUR,KLOR-CON) CR tablet 40 mEq (40 mEq  Oral Given 11/18/17 2017)  potassium chloride 10 mEq in 100 mL IVPB (0 mEq Intravenous Stopped 11/18/17 2139)  fentaNYL (SUBLIMAZE) injection 50 mcg (50 mcg Intravenous Given 11/18/17 2317)     NEW OUTPATIENT MEDICATIONS STARTED DURING THIS VISIT:  This SmartLink is deprecated. Use AVSMEDLIST instead to display the medication list for a patient.  Note:  This note was prepared with assistance of Dragon voice recognition software. Occasional wrong-word or sound-a-like substitutions may have occurred due to the inherent limitations of voice recognition software.   Mesner, Corene Cornea, MD 11/19/17 [phone]

## 2017-11-18 NOTE — Telephone Encounter (Signed)
Granddaughter called back at 3 pm to report patient hospital discharge today and not doing well. C/o H/A (rates 10/10), SHOB, dizziness and "shaking all over". States BP 171/86. Spoke with PCP who will call pt. Best # [phone]. Hubbard Hartshorn, RN, BSN

## 2017-11-18 NOTE — ED Notes (Signed)
Patient transported to X-ray 

## 2017-11-18 NOTE — ED Notes (Signed)
Pt ambulatory to bathroom with steady gait.

## 2017-11-19 LAB — TROPONIN I: Troponin I: <0.03 ng/mL (ref <0.03)

## 2017-11-26 ENCOUNTER — Emergency Department (HOSPITAL_COMMUNITY)
Admission: EM | Admit: 2017-11-26 | Discharge: 2017-11-26 | Disposition: A | Discharge status: Home / Self Care | Payer: Medicare Other | Attending: Emergency Medicine | Admitting: Emergency Medicine

## 2017-11-26 ENCOUNTER — Emergency Department (HOSPITAL_COMMUNITY): Payer: Medicare Other

## 2017-11-26 ENCOUNTER — Encounter (HOSPITAL_COMMUNITY): Payer: Self-pay

## 2017-11-26 DIAGNOSIS — E039 Hypothyroidism, unspecified: Secondary | ICD-10-CM | POA: Insufficient documentation

## 2017-11-26 DIAGNOSIS — I5032 Chronic diastolic (congestive) heart failure: Secondary | ICD-10-CM | POA: Diagnosis not present

## 2017-11-26 DIAGNOSIS — R0789 Other chest pain: Secondary | ICD-10-CM | POA: Insufficient documentation

## 2017-11-26 DIAGNOSIS — Z7982 Long term (current) use of aspirin: Secondary | ICD-10-CM | POA: Diagnosis not present

## 2017-11-26 DIAGNOSIS — I252 Old myocardial infarction: Secondary | ICD-10-CM | POA: Insufficient documentation

## 2017-11-26 DIAGNOSIS — Z8673 Personal history of transient ischemic attack (TIA), and cerebral infarction without residual deficits: Secondary | ICD-10-CM | POA: Diagnosis not present

## 2017-11-26 DIAGNOSIS — R1013 Epigastric pain: Secondary | ICD-10-CM | POA: Diagnosis present

## 2017-11-26 DIAGNOSIS — Z79899 Other long term (current) drug therapy: Secondary | ICD-10-CM | POA: Insufficient documentation

## 2017-11-26 DIAGNOSIS — R079 Chest pain, unspecified: Secondary | ICD-10-CM | POA: Diagnosis not present

## 2017-11-26 DIAGNOSIS — I110 Hypertensive heart disease with heart failure: Secondary | ICD-10-CM | POA: Insufficient documentation

## 2017-11-26 HISTORY — DX: Non-ST elevation (NSTEMI) myocardial infarction: I21.4

## 2017-11-26 LAB — CBC
HCT: 36.9 % (ref 36.0–46.0)
Hemoglobin: 11.8 g/dL — ABNORMAL LOW (ref 12.0–15.0)
MCH: 25.7 pg — ABNORMAL LOW (ref 26.0–34.0)
MCHC: 32 g/dL (ref 30.0–36.0)
MCV: 80.2 fL (ref 78.0–100.0)
Platelets: 231 10*3/uL (ref 150–400)
RBC: 4.6 MIL/uL (ref 3.87–5.11)
RDW: 14.6 % (ref 11.5–15.5)
WBC: 6.6 10*3/uL (ref 4.0–10.5)

## 2017-11-26 LAB — BASIC METABOLIC PANEL
Anion gap: 10 (ref 5–15)
BUN: 9 mg/dL (ref 6–20)
CO2: 24 mmol/L (ref 22–32)
Calcium: 9 mg/dL (ref 8.9–10.3)
Chloride: 105 mmol/L (ref 101–111)
Creatinine, Ser: 1.08 mg/dL — ABNORMAL HIGH (ref 0.44–1.00)
GFR calc Af Amer: 59 mL/min — ABNORMAL LOW (ref >60)
GFR calc non Af Amer: 51 mL/min — ABNORMAL LOW (ref >60)
Glucose, Bld: 98 mg/dL (ref 65–99)
Potassium: 3 mmol/L — ABNORMAL LOW (ref 3.5–5.1)
Sodium: 139 mmol/L (ref 135–145)

## 2017-11-26 LAB — HEPATIC FUNCTION PANEL
ALT: 17 U/L (ref 14–54)
AST: 22 U/L (ref 15–41)
Albumin: 3.6 g/dL (ref 3.5–5.0)
Alkaline Phosphatase: 80 U/L (ref 38–126)
Bilirubin, Direct: <0.1 mg/dL — ABNORMAL LOW (ref 0.1–0.5)
Total Bilirubin: 0.9 mg/dL (ref 0.3–1.2)
Total Protein: 5.9 g/dL — ABNORMAL LOW (ref 6.5–8.1)

## 2017-11-26 LAB — I-STAT TROPONIN, ED
Troponin i, poc: 0 ng/mL (ref 0.00–0.08)
Troponin i, poc: 0.02 ng/mL (ref 0.00–0.08)

## 2017-11-26 LAB — LIPASE, BLOOD: Lipase: 20 U/L (ref 11–51)

## 2017-11-26 MED ORDER — IOPAMIDOL (ISOVUE-370) INJECTION 76%
INTRAVENOUS | Status: AC
  Administered 2017-11-26: 100 mL
  Filled 2017-11-26: qty 100

## 2017-11-26 MED ORDER — ACETAMINOPHEN 325 MG PO TABS
650.0000 mg | ORAL_TABLET | Freq: Once | ORAL | Status: AC
  Administered 2017-11-26: 650 mg via ORAL
  Filled 2017-11-26: qty 2

## 2017-11-26 NOTE — ED Notes (Signed)
HFP and lipase modified as addons, this RN sent up lab collection label to main lab

## 2017-11-26 NOTE — ED Triage Notes (Signed)
Pt with recent hx of NSTEMI via EMS for L sided CP since 1100 this AM. Pt reports dull pain under her L breast that radiates to her back and under her R breast. Pt also reports nausea and chills. Denies SOB. Pt given 324 ASA and 2 NTG with minimal pain relief. Pt pain 9/10 at this time. EMS VS: 127/81, 69 HR, 96% on RA. A&Ox4.

## 2017-11-26 NOTE — ED Notes (Signed)
Patient transported to CT 

## 2017-11-26 NOTE — ED Notes (Signed)
ED Provider at bedside. 
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
## 2017-11-26 NOTE — ED Provider Notes (Signed)
[facility] EMERGENCY DEPARTMENT Provider Note   CSN: [id number] Arrival date & time: 11/26/17  1243     History   Chief Complaint Chief Complaint  Patient presents with  . Chest Pain  . Abdominal Pain    HPI Erica Morris is a 71 y.o. female.  HPI   71 year old female history of anxiety, non-STEMI, hypertension, stroke presents today complaining of epigastric abdominal pain that began at 11 AM this morning.  She states pain has radiated up into the right side of her neck and down into her lower abdomen.  She describes it as dull and sharp.  She has rated pain at 9 out of 10.  She received aspirin and nitroglycerin prior to my evaluation.  She describes an episode of vomiting.  Past Medical History:  Diagnosis Date  . ABDOMINAL PAIN, RECURRENT 03/29/2008  . ANXIETY 10/07/2007  . Candidiasis of mouth 05/30/2010  . GERD 07/25/2007  . Hernia, hiatal   . HYPERTENSION 07/25/2007  . NSTEMI (non-ST elevated myocardial infarction) ([place]) 10/2017  . Stroke [facility])     Patient Active Problem List   Diagnosis Date Noted  . Abrasion of anterior right lower leg 11/05/2017  . Elevated troponin   . Acute bronchitis   . Cough 10/28/2017  . Chest pain 10/28/2017  . NSTEMI (non-ST elevated myocardial infarction) ([place]) 10/28/2017  . Rash 10/28/2017  . Dry mouth 10/07/2017  . Nasal congestion 09/16/2017  . Vertigo 08/10/2017  . Candidiasis, intertrigo 04/02/2017  . Diastolic CHF ([place]) 76/73/4193  . 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 Hypothyroidism 11/17/2016  . Episode of recurrent major depressive disorder ([place])   . History of CVA (cerebrovascular accident)   . Pure hypercholesterolemia 11/06/2016  . Chronic venous insufficiency 11/05/2016  . Peripheral neuropathy 11/05/2016  . Cerebrovascular disease 06/09/2016  . Orthostatic hypotension 06/09/2016  . Hypokalemia 02/20/2015  . History of colonic polyps 06/04/2014  .  Depression 03/29/2014  . Chronic constipation 03/29/2014  . Abdominal pain 03/29/2008  . Anxiety state 10/07/2007  . Essential hypertension 07/25/2007  . GERD 07/25/2007    Past Surgical History:  Procedure Laterality Date  . ABDOMINAL HYSTERECTOMY    . BREAST SURGERY     bx  . KNEE ARTHROSCOPY    . LEFT HEART CATH AND CORONARY ANGIOGRAPHY N/A 11/02/2017   Procedure: LEFT HEART CATH AND CORONARY ANGIOGRAPHY;  Surgeon: Burnell Blanks, MD;  Location: [facility];  Service: Cardiovascular;  Laterality: N/A;    OB History    No data available       Home Medications    Prior to Admission medications   Medication Sig Start Date End Date Taking? Authorizing Provider  albuterol (PROVENTIL HFA;VENTOLIN HFA) 108 (90 Base) MCG/ACT inhaler Inhale 2 puffs into the lungs every 4 (four) hours as needed for wheezing or shortness of breath. 10/26/17   Mercy Riding, MD  aspirin EC 81 MG tablet Take 81 mg by mouth every morning.     [provider]  atorvastatin (LIPITOR) 40 MG tablet Take 1 tablet (40 mg total) by mouth daily. 01/11/17   Dickie La, MD  benzonatate (TESSALON) 200 MG capsule Take 1 capsule (200 mg total) by mouth 3 (three) times daily as needed for cough. 11/10/17   Zenia Resides, MD  busPIRone (BUSPAR) 10 MG tablet Take 1 tablet (10 mg total) by mouth 3 (three) times daily. 11/04/17   Zenia Resides, MD  fluticasone Asencion Islam)  50 MCG/ACT nasal spray Place 1 spray into both nostrils 2 (two) times daily. 11/03/17   Hosie Poisson, MD  furosemide (LASIX) 20 MG tablet Take 1 tablet (20 mg total) by mouth daily. 03/10/17   Zenia Resides, MD  levothyroxine (SYNTHROID, LEVOTHROID) 50 MCG tablet Take 1 tablet (50 mcg total) daily before breakfast by mouth. 10/11/17   Enid Derry, [place], DO  loratadine (CLARITIN) 10 MG tablet Take 1 tablet (10 mg total) by mouth daily as needed for allergies. 11/10/17   Zenia Resides, MD  metoprolol tartrate (LOPRESSOR) 25  MG tablet Take 0.5 tablets (12.5 mg total) by mouth 2 (two) times daily. Patient taking differently: Take 25 mg by mouth 2 (two) times daily.  11/03/17   Hosie Poisson, MD  pantoprazole (PROTONIX) 40 MG tablet TAKE 1 TABLET(40 MG) BY MOUTH DAILY 07/19/17   Hensel, Jamal Collin, MD  polyethylene glycol (MIRALAX / GLYCOLAX) packet Take 17 g by mouth daily as needed. Patient taking differently: Take 17 g by mouth daily as needed for mild constipation.  11/03/17   Hosie Poisson, MD  ranitidine (ZANTAC) 150 MG tablet Take 150 mg by mouth at bedtime.    [provider]  Spacer/Aero-Holding Chambers (AEROCHAMBER PLUS) inhaler Use as instructed 10/26/17   Mercy Riding, MD  venlafaxine XR (EFFEXOR XR) 75 MG 24 hr capsule Take 1 capsule (75 mg total) by mouth daily with breakfast. 11/15/17   Andria Frames Jamal Collin, MD    Family History Family History  Problem Relation Age of Onset  . Dementia Mother   . Alcoholism Mother   . COPD Father   . Diabetes Sister   . Hypertension Sister   . Cancer Brother        Lung CA  . COPD Brother   . Clotting disorder Brother   . Kidney disease Neg Hx   . Stroke Neg Hx     Social History Social History   Tobacco Use  . Smoking status: Never Smoker  . Smokeless tobacco: Never Used  Substance Use Topics  . Alcohol use: Yes    Comment: occasional  . Drug use: No     Allergies   Lisinopril; Codeine phosphate; and Morphine and related   Review of Systems Review of Systems  All other systems reviewed and are negative.    Physical Exam Updated Vital Signs BP (!) 144/70   Pulse (!) 56   Temp 98.6 F (37 C) (Oral)   Resp 17   Ht 1.626 m (5\' 4" )   Wt 86.6 kg (191 lb)   SpO2 99%   BMI 32.79 kg/m   Physical Exam  Constitutional: She is oriented to person, place, and time. She appears well-developed.  HENT:  Head: Normocephalic and atraumatic.  Eyes: EOM are normal. Pupils are equal, round, and reactive to light.  Neck: Normal range of  motion. Neck supple.  Cardiovascular: Normal rate, regular rhythm, intact distal pulses and normal pulses.  Pulmonary/Chest: Effort normal and breath sounds normal.  Abdominal: Soft. She exhibits no distension, no ascites and no mass. There is tenderness. There is no rebound and no guarding.  Musculoskeletal: Normal range of motion.  Neurological: She is alert and oriented to person, place, and time.  Skin: Skin is warm and dry. Capillary refill takes less than 2 seconds.  Psychiatric: She has a normal mood and affect. Her behavior is normal.  Nursing note and vitals reviewed.    ED Treatments / Results  Labs (all labs ordered are  listed, but only abnormal results are displayed) Labs Reviewed  CBC - Abnormal; Notable for the following components:      Result Value   Hemoglobin 11.8 (*)    MCH 25.7 (*)    All other components within normal limits  BASIC METABOLIC PANEL  I-STAT TROPONIN, ED    EKG  EKG Interpretation  Date/Time:  Friday November 26 2017 12:54:31 EST Ventricular Rate:  55 PR Interval:    QRS Duration: 97 QT Interval:  450 QTC Calculation: 431 R Axis:   50 Text Interpretation:  Sinus rhythm Borderline ST depression, diffuse leads No significant change since last tracing Confirmed by Pattricia Boss [phone]) on 11/26/2017 1:26:38 PM       Radiology No results found.  Procedures Procedures (including critical care time)  Medications Ordered in ED Medications - No data to display   Initial Impression / Assessment and Plan / ED Course  I have reviewed the triage vital signs and the nursing notes.  Pertinent labs & imaging results that were available during my care of the patient were reviewed by me and considered in my medical decision making (see chart for details).     71 year old female presents today with upper abdominal pain rating up to the neck and down into suprapubic region.  CT of chest and abdomen reveals no evidence of dissection or other  acute intrathoracic or intra-abdominal etiology of pain.  She has remained hemodynamically stable here.  She is mildly anemic with hemoglobin 11.8 and otherwise labs are normal.  She is requesting food.  She is being given a p.o. challenge and will have a repeat troponin done.  EKG has nonspecific ST changes and is unchanged from prior.  I doubt that this represents cardiac pain.  She appears stable.  We have discussed return precautions and need for follow-up and she voices understanding.  Final Clinical Impressions(s) / ED Diagnoses   Final diagnoses:  Epigastric pain    ED Discharge Orders    None       Pattricia Boss, MD 11/26/17 1558

## 2017-11-27 ENCOUNTER — Other Ambulatory Visit: Payer: Self-pay | Admitting: Family Medicine

## 2017-11-27 ENCOUNTER — Telehealth: Payer: Self-pay | Admitting: Family Medicine

## 2017-11-27 MED ORDER — ONDANSETRON HCL 4 MG PO TABS: 4.0000 mg | ORAL_TABLET | Freq: Three times a day (TID) | ORAL | 0 refills | Status: DC | PRN

## 2017-11-27 NOTE — Telephone Encounter (Signed)
**  After Hours/ Emergency Line Call*  Received a call from Eldred (patient's daughter) to report that Erica Morris has not had a bowel movement in 3 days.  Endorsing some abdominal distension without vomiting. She has tried some miralax and suppositories and has had one small bowel movement. She denies any blood in her stool. Recommended that patient try prunes and metamucil and to increase PO intake. Red flags discussed. Recommended coming in for same day visit on 12/31 if no BM.  Will forward to PCP. Placed order for zofran 4mg  q8rs PRN dispensed 10 pills.   Eloise Levels, MD PGY-2, [place] [facility]

## 2017-12-01 ENCOUNTER — Ambulatory Visit (INDEPENDENT_AMBULATORY_CARE_PROVIDER_SITE_OTHER): Payer: Medicare Other | Admitting: Family Medicine

## 2017-12-01 ENCOUNTER — Other Ambulatory Visit: Payer: Self-pay

## 2017-12-01 ENCOUNTER — Encounter: Payer: Self-pay | Admitting: Family Medicine

## 2017-12-01 DIAGNOSIS — K5909 Other constipation: Secondary | ICD-10-CM

## 2017-12-01 DIAGNOSIS — R1084 Generalized abdominal pain: Secondary | ICD-10-CM

## 2017-12-01 DIAGNOSIS — F339 Major depressive disorder, recurrent, unspecified: Secondary | ICD-10-CM | POA: Diagnosis not present

## 2017-12-01 DIAGNOSIS — F411 Generalized anxiety disorder: Secondary | ICD-10-CM | POA: Diagnosis not present

## 2017-12-01 MED ORDER — QUETIAPINE FUMARATE 25 MG PO TABS
25.0000 mg | ORAL_TABLET | Freq: Every day | ORAL | 3 refills | Status: DC
Start: 2017-12-01 — End: 2018-01-06

## 2017-12-01 MED ORDER — BUSPIRONE HCL 10 MG PO TABS: 10.0000 mg | ORAL_TABLET | Freq: Four times a day (QID) | ORAL | 6 refills | Status: DC

## 2017-12-01 NOTE — Patient Instructions (Signed)
I sent in two prescriptions. One is for the buspar 4 times a day Also a new nerve pill at night. Get out of bed and be more active.  Do not lay around waiting. Eat more and drink more fluids.  Both of [place] need to do our parts to get you feeling better.   I will call with x ray results.

## 2017-12-02 ENCOUNTER — Ambulatory Visit
Admission: RE | Admit: 2017-12-02 | Discharge: 2017-12-02 | Disposition: A | Discharge status: Home / Self Care | Payer: Medicare Other | Source: Ambulatory Visit | Attending: Family Medicine | Admitting: Family Medicine

## 2017-12-02 ENCOUNTER — Encounter: Payer: Self-pay | Admitting: Family Medicine

## 2017-12-02 DIAGNOSIS — R1084 Generalized abdominal pain: Secondary | ICD-10-CM

## 2017-12-02 NOTE — Assessment & Plan Note (Signed)
X ray does note show large stool burden.  Suspect this is more dietary.  Hopefully, the seroquel will help.  Continue miralax.

## 2017-12-02 NOTE — Assessment & Plan Note (Signed)
Complaints of pain but non tender on exam.  Again suspect psych is driving this issue.

## 2017-12-02 NOTE — Assessment & Plan Note (Signed)
I think the psychiatric issues are driving her complaints.  Continue buspar.  Add seroquel. Recheck one month.

## 2017-12-02 NOTE — Assessment & Plan Note (Signed)
While she clearly has an anxious affect, her psychiatric issues are much deeper than simple anxiety.

## 2017-12-02 NOTE — Progress Notes (Signed)
   Subjective:    Patient ID: Erica Morris, female    DOB: [birth year], 71 y.o.   MRN: [id number]  HPI Very challenging patient who has a host of somatic complaints.  Skipping to the conclusion, I believe the vast majority are anxiety based.  Current issues. 1. States cannot eat, no bm x 1 week.  Generalized abd pain.   2. Anxiety.  Causes her to be sleepless and pace.  Multiple ER visits.  Buspar is helping some but not sufficient.  While she reports anxiety, she does not get out of the bed during the day.   3. Still with intermitant chest pain.  Has the unusual combination of a recent non ST MI but normal cardiac cath.  Again, she was highly anxious at the time.    Review of Systems     Objective:   Physical Exam note wt loss. Lungs clear Cardiac RRR without m or g Abd not distended.  Non tender. Affect, alternates between pressured speech and resignation.        Assessment & Plan:

## 2017-12-03 ENCOUNTER — Other Ambulatory Visit: Payer: Self-pay | Admitting: Family Medicine

## 2017-12-13 ENCOUNTER — Encounter (HOSPITAL_COMMUNITY): Payer: Self-pay

## 2017-12-13 ENCOUNTER — Telehealth: Payer: Self-pay | Admitting: Family Medicine

## 2017-12-13 DIAGNOSIS — R42 Dizziness and giddiness: Secondary | ICD-10-CM | POA: Diagnosis not present

## 2017-12-13 DIAGNOSIS — Z5321 Procedure and treatment not carried out due to patient leaving prior to being seen by health care provider: Secondary | ICD-10-CM | POA: Insufficient documentation

## 2017-12-13 DIAGNOSIS — F410 Panic disorder [episodic paroxysmal anxiety] without agoraphobia: Secondary | ICD-10-CM | POA: Insufficient documentation

## 2017-12-13 DIAGNOSIS — R404 Transient alteration of awareness: Secondary | ICD-10-CM | POA: Diagnosis not present

## 2017-12-13 LAB — CBC
HCT: 36.6 % (ref 36.0–46.0)
Hemoglobin: 11.8 g/dL — ABNORMAL LOW (ref 12.0–15.0)
MCH: 26.5 pg (ref 26.0–34.0)
MCHC: 32.2 g/dL (ref 30.0–36.0)
MCV: 82.2 fL (ref 78.0–100.0)
Platelets: 223 10*3/uL (ref 150–400)
RBC: 4.45 MIL/uL (ref 3.87–5.11)
RDW: 14.4 % (ref 11.5–15.5)
WBC: 7.7 10*3/uL (ref 4.0–10.5)

## 2017-12-13 LAB — BASIC METABOLIC PANEL
Anion gap: 10 (ref 5–15)
BUN: 9 mg/dL (ref 6–20)
CO2: 22 mmol/L (ref 22–32)
Calcium: 9 mg/dL (ref 8.9–10.3)
Chloride: 108 mmol/L (ref 101–111)
Creatinine, Ser: 0.93 mg/dL (ref 0.44–1.00)
GFR calc Af Amer: >60 mL/min (ref >60)
GFR calc non Af Amer: >60 mL/min (ref >60)
Glucose, Bld: 93 mg/dL (ref 65–99)
Potassium: 3.1 mmol/L — ABNORMAL LOW (ref 3.5–5.1)
Sodium: 140 mmol/L (ref 135–145)

## 2017-12-13 LAB — URINALYSIS, ROUTINE W REFLEX MICROSCOPIC
Bilirubin Urine: NEGATIVE
Glucose, UA: NEGATIVE mg/dL
Hgb urine dipstick: NEGATIVE
Ketones, ur: NEGATIVE mg/dL
Leukocytes, UA: NEGATIVE
Nitrite: NEGATIVE
Protein, ur: NEGATIVE mg/dL
Specific Gravity, Urine: 1.013 (ref 1.005–1.030)
pH: 7 (ref 5.0–8.0)

## 2017-12-13 NOTE — ED Triage Notes (Signed)
Pt comes from home via [facility] for dry mouth, dizziness, and nervousness. Pt states she has had a panic attack everyday for the past several months with recent medication change. Per EMS house smelled strongly of [place], pt denies use this evening.

## 2017-12-13 NOTE — Telephone Encounter (Signed)
Dr Andria Frames gave pt a new nerve medicine.  She is more nervous with this medication. She would like to have something else. Please advise.

## 2017-12-13 NOTE — ED Notes (Signed)
Called Pt for vitals no answer. 

## 2017-12-14 ENCOUNTER — Other Ambulatory Visit: Payer: Self-pay | Admitting: *Deleted

## 2017-12-14 ENCOUNTER — Telehealth: Payer: Self-pay | Admitting: *Deleted

## 2017-12-14 ENCOUNTER — Emergency Department (HOSPITAL_COMMUNITY)
Admission: EM | Admit: 2017-12-14 | Discharge: 2017-12-14 | Discharge status: Against Medical Advice | Payer: Medicare Other | Attending: Emergency Medicine | Admitting: Emergency Medicine

## 2017-12-14 NOTE — ED Notes (Signed)
Pt's name called for vitals no answer

## 2017-12-14 NOTE — Telephone Encounter (Signed)
Entered in error

## 2017-12-15 ENCOUNTER — Telehealth: Payer: Self-pay | Admitting: *Deleted

## 2017-12-15 MED ORDER — METOPROLOL TARTRATE 25 MG PO TABS: 25.0000 mg | ORAL_TABLET | Freq: Two times a day (BID) | ORAL | 3 refills | Status: DC

## 2017-12-15 NOTE — Telephone Encounter (Signed)
Patient has longstanding emotional issues resulting in the psychiatric complaints of anxiety and insomnia along with multiple somatic complaints of chest pain, pruritis and shortness of breath.    She had been treated long term with clonazepam 1 mg bid which I stopped when she became my patient - see note of 11/05/16 - because [place] drug and increased fall risk.    While I do not know what she was like on the clonazepam, I do know that since she has been off the medication, she has complained bitterly about her "nerves" with the associated somatic complaints.  She is now on three psych drugs; Buspar, seroquel and effexor: tells me that none are effective and wants another nerve pill.  While she does not ask for benzos directly, I suspect she felt best on the clonazepam.    All this is to say that I will not continue to churn her medications.  I would like subspecialty help with this patient and will ask that she be seen in mood disorder clinic to review and recommend approach for psychotropic meds.

## 2017-12-15 NOTE — Progress Notes (Signed)
Type of Service: Clinical Social Work  Social work consult from [employer]. Lauren reference  Patient's daughter-in-law Lenna Sciara called, patient with anxiety unable to get under control. LCSW called Melissa to asses the need. States patient is feeling better at this time.  Have scheduled an appointment tomorrow afternoon with PCP The following was discussed;provided Melissa with phone number to mobil crisis in the event patient needs intervention.  Melissa also looking to get patient in to see psychiatry.  She will contact the office near her home to see when she can get patient an appointment.  Update provided to PCP.  Intervention: emotional support, solution focus interventions and Community Resource   Plan: Lenna Sciara will call mobile crisis if needed, will bring patient to appointment with PCP tomorrow and f/u with psychiatry.   Casimer Lanius, LCSW Licensed Clinical Social Worker [facility]   [phone] 2:53 PM

## 2017-12-15 NOTE — Telephone Encounter (Signed)
Daughter-in-law, Lenna Sciara left message on nurse line stating patient's anxiety "is really high today." Is trying to keep from going to ED. Would like advice. Will route to PCP as well as In-House LCSW. Hubbard Hartshorn, RN, BSN

## 2017-12-15 NOTE — Telephone Encounter (Signed)
Noted  

## 2017-12-21 NOTE — Telephone Encounter (Signed)
Called patient to discuss [facility].  First opening for a new patient was March 6th.  Scheduled for 10:30.  Reports she had panic attacks as a kid and they have come back.  Mom had panic attacks too.  Not doing well on nerve medications Dr. Andria Frames recently prescribed for her.  Would like a call if I have a cancellation prior to the 6th.  I told her the following: -  If she is unable to make the appointment she needs to call me. -  If she misses the appointment without a phone call, I won't be able to schedule her back in my clinic. She voiced an understanding and was able to repeat the appointment date and time back to me.

## 2017-12-21 NOTE — Telephone Encounter (Signed)
When last I talked to her and daughter, daughter stated plan was to see the daughter's psychologist.  Thanks to Dr. Gwenlyn Saran for reaching out.  I think the ball is clearly in the court of Ms. Wolpert and her family.

## 2017-12-22 ENCOUNTER — Ambulatory Visit: Payer: Medicare Other | Admitting: Family Medicine

## 2017-12-22 NOTE — Telephone Encounter (Addendum)
Lenna Sciara (patient's daughter) left a VM stating that she has an appointment on March 6th so she can not bring her mother to [facility].  Offered an earlier time so she can make it to both appointments but daughter thought that was too much.  Scheduled her for March 20th at 10:30.  Of note - clarified Dr. Lowella Bandy comment about Mrs. Salvi seeing her daughter's psychiatrist.  This was a no-go from an insurance perspective.

## 2017-12-31 ENCOUNTER — Other Ambulatory Visit: Payer: Self-pay | Admitting: Family Medicine

## 2017-12-31 DIAGNOSIS — E7800 Pure hypercholesterolemia, unspecified: Secondary | ICD-10-CM

## 2018-01-06 ENCOUNTER — Encounter: Payer: Self-pay | Admitting: Family Medicine

## 2018-01-06 ENCOUNTER — Ambulatory Visit (INDEPENDENT_AMBULATORY_CARE_PROVIDER_SITE_OTHER): Payer: Medicare Other | Admitting: Family Medicine

## 2018-01-06 ENCOUNTER — Ambulatory Visit: Payer: Medicare Other | Admitting: Family Medicine

## 2018-01-06 ENCOUNTER — Other Ambulatory Visit: Payer: Self-pay

## 2018-01-06 DIAGNOSIS — J449 Chronic obstructive pulmonary disease, unspecified: Secondary | ICD-10-CM | POA: Diagnosis not present

## 2018-01-06 DIAGNOSIS — F339 Major depressive disorder, recurrent, unspecified: Secondary | ICD-10-CM | POA: Diagnosis not present

## 2018-01-06 DIAGNOSIS — F411 Generalized anxiety disorder: Secondary | ICD-10-CM | POA: Diagnosis not present

## 2018-01-06 MED ORDER — QUETIAPINE FUMARATE 25 MG PO TABS: 25.0000 mg | ORAL_TABLET | Freq: Two times a day (BID) | ORAL | 3 refills | Status: DC

## 2018-01-06 MED ORDER — DOXYCYCLINE HYCLATE 100 MG PO TABS
100.0000 mg | ORAL_TABLET | Freq: Two times a day (BID) | ORAL | 0 refills | Status: DC
Start: 2018-01-06 — End: 2018-01-09

## 2018-01-06 MED ORDER — PREDNISONE 50 MG PO TABS: 50.0000 mg | ORAL_TABLET | Freq: Every day | ORAL | 0 refills | Status: DC

## 2018-01-06 NOTE — Patient Instructions (Addendum)
I am worried that you have COPD from all the second hand smoke.  On your way out, please make an appointment to see Dr. Valentina Lucks for a breathing test to find out for sure if you have COPD.  For your anxiety, stop the buspirone. Start taking the quetiapine morning and night.  I sent in a new prescription.

## 2018-01-06 NOTE — Assessment & Plan Note (Signed)
Discontinue buspirone.  Add daytime dose of seroquel.

## 2018-01-06 NOTE — Assessment & Plan Note (Signed)
>>  ASSESSMENT AND PLAN FOR COPD SUGGESTED BY INITIAL EVALUATION ([facility]) WRITTEN ON 01/06/2018  4:21 PM BY HENSEL, Santiago Bumpers, MD  I am now concerned with COPD.  Will treat as an exacerbation.  Obtain PFTs.  May need a daily LAMA

## 2018-01-06 NOTE — Progress Notes (Signed)
   Subjective:    Patient ID: Erica Morris, female    DOB: 10-25-1946, 72 y.o.   MRN: [id number]  HPI  Difficult hx as always due to multiple complaints.  Focus on bronchitis and anxiety. Cough again.  Thinks she has bronchitis again.  Never dxed as asthma.  Never smoker but she has been exposed to a lot of passive smoking.  Review of chart shows multiple respiratory illnesses of late. No fever.  Dry, non productive cough. Anxiety.  Very prevalent.  State buspirone without benefit.  Seroquel does help at night.  Anxiety prevalent during the day.     Review of Systems     Objective:   Physical Exam  End insp and exp wheeze.       Assessment & Plan:

## 2018-01-06 NOTE — Assessment & Plan Note (Signed)
DC buspirone and add daytime seroque.

## 2018-01-06 NOTE — Assessment & Plan Note (Addendum)
I am now concerned with COPD.  Will treat as an exacerbation.  Obtain PFTs.  May need a daily LAMA

## 2018-01-07 ENCOUNTER — Emergency Department (HOSPITAL_COMMUNITY)
Admission: EM | Admit: 2018-01-07 | Discharge: 2018-01-08 | Disposition: A | Discharge status: Home / Self Care | Payer: Medicare Other | Attending: Emergency Medicine | Admitting: Emergency Medicine

## 2018-01-07 ENCOUNTER — Encounter (HOSPITAL_COMMUNITY): Payer: Self-pay | Admitting: Emergency Medicine

## 2018-01-07 ENCOUNTER — Emergency Department (HOSPITAL_COMMUNITY): Payer: Medicare Other

## 2018-01-07 DIAGNOSIS — R0602 Shortness of breath: Secondary | ICD-10-CM | POA: Insufficient documentation

## 2018-01-07 DIAGNOSIS — J9811 Atelectasis: Secondary | ICD-10-CM | POA: Diagnosis not present

## 2018-01-07 DIAGNOSIS — Z5321 Procedure and treatment not carried out due to patient leaving prior to being seen by health care provider: Secondary | ICD-10-CM | POA: Insufficient documentation

## 2018-01-07 DIAGNOSIS — R079 Chest pain, unspecified: Secondary | ICD-10-CM | POA: Insufficient documentation

## 2018-01-07 LAB — BASIC METABOLIC PANEL
Anion gap: 14 (ref 5–15)
BUN: 20 mg/dL (ref 6–20)
CO2: 23 mmol/L (ref 22–32)
Calcium: 9 mg/dL (ref 8.9–10.3)
Chloride: 101 mmol/L (ref 101–111)
Creatinine, Ser: 0.97 mg/dL (ref 0.44–1.00)
GFR calc Af Amer: >60 mL/min (ref >60)
GFR calc non Af Amer: 58 mL/min — ABNORMAL LOW (ref >60)
Glucose, Bld: 106 mg/dL — ABNORMAL HIGH (ref 65–99)
Potassium: 2.8 mmol/L — ABNORMAL LOW (ref 3.5–5.1)
Sodium: 138 mmol/L (ref 135–145)

## 2018-01-07 LAB — CBC
HCT: 38.2 % (ref 36.0–46.0)
Hemoglobin: 12.6 g/dL (ref 12.0–15.0)
MCH: 26.5 pg (ref 26.0–34.0)
MCHC: 33 g/dL (ref 30.0–36.0)
MCV: 80.4 fL (ref 78.0–100.0)
Platelets: 250 10*3/uL (ref 150–400)
RBC: 4.75 MIL/uL (ref 3.87–5.11)
RDW: 14.1 % (ref 11.5–15.5)
WBC: 9 10*3/uL (ref 4.0–10.5)

## 2018-01-07 LAB — I-STAT TROPONIN, ED: Troponin i, poc: 0 ng/mL (ref 0.00–0.08)

## 2018-01-07 NOTE — ED Notes (Signed)
No response to take pt to waiting area.

## 2018-01-07 NOTE — ED Notes (Signed)
Pt called from the lobby with no response 

## 2018-01-07 NOTE — ED Triage Notes (Signed)
Patient having chest pain, SOB, cough "that is choking me" and back pain that started this morning. Pt reported had heart attack in DEC and symptoms are the same as today.

## 2018-01-09 ENCOUNTER — Emergency Department (HOSPITAL_COMMUNITY): Payer: Medicare Other

## 2018-01-09 ENCOUNTER — Other Ambulatory Visit: Payer: Self-pay

## 2018-01-09 ENCOUNTER — Observation Stay (HOSPITAL_COMMUNITY): Payer: Medicare Other

## 2018-01-09 ENCOUNTER — Observation Stay (HOSPITAL_COMMUNITY)
Admission: EM | Admit: 2018-01-09 | Discharge: 2018-01-10 | Disposition: A | Discharge status: Home / Self Care | Payer: Medicare Other | Attending: Family Medicine | Admitting: Family Medicine

## 2018-01-09 ENCOUNTER — Encounter (HOSPITAL_COMMUNITY): Payer: Self-pay | Admitting: *Deleted

## 2018-01-09 DIAGNOSIS — F419 Anxiety disorder, unspecified: Secondary | ICD-10-CM | POA: Insufficient documentation

## 2018-01-09 DIAGNOSIS — I110 Hypertensive heart disease with heart failure: Secondary | ICD-10-CM | POA: Insufficient documentation

## 2018-01-09 DIAGNOSIS — G459 Transient cerebral ischemic attack, unspecified: Secondary | ICD-10-CM | POA: Diagnosis not present

## 2018-01-09 DIAGNOSIS — J449 Chronic obstructive pulmonary disease, unspecified: Secondary | ICD-10-CM | POA: Insufficient documentation

## 2018-01-09 DIAGNOSIS — J019 Acute sinusitis, unspecified: Secondary | ICD-10-CM | POA: Insufficient documentation

## 2018-01-09 DIAGNOSIS — R0789 Other chest pain: Secondary | ICD-10-CM | POA: Diagnosis present

## 2018-01-09 DIAGNOSIS — Z79899 Other long term (current) drug therapy: Secondary | ICD-10-CM | POA: Insufficient documentation

## 2018-01-09 DIAGNOSIS — Z8673 Personal history of transient ischemic attack (TIA), and cerebral infarction without residual deficits: Secondary | ICD-10-CM | POA: Insufficient documentation

## 2018-01-09 DIAGNOSIS — E785 Hyperlipidemia, unspecified: Secondary | ICD-10-CM | POA: Diagnosis not present

## 2018-01-09 DIAGNOSIS — R0602 Shortness of breath: Secondary | ICD-10-CM | POA: Diagnosis not present

## 2018-01-09 DIAGNOSIS — R200 Anesthesia of skin: Secondary | ICD-10-CM | POA: Diagnosis not present

## 2018-01-09 DIAGNOSIS — F410 Panic disorder [episodic paroxysmal anxiety] without agoraphobia: Secondary | ICD-10-CM

## 2018-01-09 DIAGNOSIS — E039 Hypothyroidism, unspecified: Secondary | ICD-10-CM | POA: Insufficient documentation

## 2018-01-09 DIAGNOSIS — I5030 Unspecified diastolic (congestive) heart failure: Secondary | ICD-10-CM | POA: Insufficient documentation

## 2018-01-09 DIAGNOSIS — I10 Essential (primary) hypertension: Secondary | ICD-10-CM

## 2018-01-09 DIAGNOSIS — R51 Headache: Secondary | ICD-10-CM | POA: Insufficient documentation

## 2018-01-09 DIAGNOSIS — R519 Headache, unspecified: Secondary | ICD-10-CM

## 2018-01-09 DIAGNOSIS — R42 Dizziness and giddiness: Secondary | ICD-10-CM | POA: Diagnosis not present

## 2018-01-09 DIAGNOSIS — R079 Chest pain, unspecified: Secondary | ICD-10-CM | POA: Diagnosis not present

## 2018-01-09 DIAGNOSIS — I679 Cerebrovascular disease, unspecified: Secondary | ICD-10-CM

## 2018-01-09 LAB — COMPREHENSIVE METABOLIC PANEL
ALT: 10 U/L — ABNORMAL LOW (ref 14–54)
AST: 21 U/L (ref 15–41)
Albumin: 3.5 g/dL (ref 3.5–5.0)
Alkaline Phosphatase: 85 U/L (ref 38–126)
Anion gap: 14 (ref 5–15)
BUN: 21 mg/dL — ABNORMAL HIGH (ref 6–20)
CO2: 24 mmol/L (ref 22–32)
Calcium: 8.8 mg/dL — ABNORMAL LOW (ref 8.9–10.3)
Chloride: 101 mmol/L (ref 101–111)
Creatinine, Ser: 0.97 mg/dL (ref 0.44–1.00)
GFR calc Af Amer: >60 mL/min (ref >60)
GFR calc non Af Amer: 58 mL/min — ABNORMAL LOW (ref >60)
Glucose, Bld: 103 mg/dL — ABNORMAL HIGH (ref 65–99)
Potassium: 2.9 mmol/L — ABNORMAL LOW (ref 3.5–5.1)
Sodium: 139 mmol/L (ref 135–145)
Total Bilirubin: 0.6 mg/dL (ref 0.3–1.2)
Total Protein: 6 g/dL — ABNORMAL LOW (ref 6.5–8.1)

## 2018-01-09 LAB — URINALYSIS, ROUTINE W REFLEX MICROSCOPIC
Bilirubin Urine: NEGATIVE
Glucose, UA: NEGATIVE mg/dL
Hgb urine dipstick: NEGATIVE
Ketones, ur: NEGATIVE mg/dL
Nitrite: NEGATIVE
Protein, ur: 30 mg/dL — AB
Specific Gravity, Urine: 1.03 (ref 1.005–1.030)
pH: 5 (ref 5.0–8.0)

## 2018-01-09 LAB — RAPID URINE DRUG SCREEN, HOSP PERFORMED
Amphetamines: NOT DETECTED
Barbiturates: NOT DETECTED
Benzodiazepines: NOT DETECTED
Cocaine: NOT DETECTED
Opiates: NOT DETECTED
Tetrahydrocannabinol: NOT DETECTED

## 2018-01-09 LAB — LIPID PANEL
Cholesterol: 161 mg/dL (ref 0–200)
HDL: 37 mg/dL — ABNORMAL LOW (ref >40)
LDL Cholesterol: 89 mg/dL (ref 0–99)
Total CHOL/HDL Ratio: 4.4 RATIO
Triglycerides: 176 mg/dL — ABNORMAL HIGH (ref <150)
VLDL: 35 mg/dL (ref 0–40)

## 2018-01-09 LAB — CBC WITH DIFFERENTIAL/PLATELET
Basophils Absolute: 0 10*3/uL (ref 0.0–0.1)
Basophils Relative: 0 %
Eosinophils Absolute: 0 10*3/uL (ref 0.0–0.7)
Eosinophils Relative: 0 %
HCT: 38.4 % (ref 36.0–46.0)
Hemoglobin: 12.2 g/dL (ref 12.0–15.0)
Lymphocytes Relative: 27 %
Lymphs Abs: 2.4 10*3/uL (ref 0.7–4.0)
MCH: 26.3 pg (ref 26.0–34.0)
MCHC: 31.8 g/dL (ref 30.0–36.0)
MCV: 82.8 fL (ref 78.0–100.0)
Monocytes Absolute: 0.5 10*3/uL (ref 0.1–1.0)
Monocytes Relative: 6 %
Neutro Abs: 6 10*3/uL (ref 1.7–7.7)
Neutrophils Relative %: 67 %
Platelets: 238 10*3/uL (ref 150–400)
RBC: 4.64 MIL/uL (ref 3.87–5.11)
RDW: 14.5 % (ref 11.5–15.5)
WBC: 8.9 10*3/uL (ref 4.0–10.5)

## 2018-01-09 LAB — I-STAT TROPONIN, ED: Troponin i, poc: 0.01 ng/mL (ref 0.00–0.08)

## 2018-01-09 LAB — HEMOGLOBIN A1C
Hgb A1c MFr Bld: 5.7 % — ABNORMAL HIGH (ref 4.8–5.6)
Mean Plasma Glucose: 116.89 mg/dL

## 2018-01-09 LAB — PROTIME-INR
INR: 1.07
Prothrombin Time: 13.8 seconds (ref 11.4–15.2)

## 2018-01-09 LAB — TSH: TSH: 1.043 u[IU]/mL (ref 0.350–4.500)

## 2018-01-09 LAB — ETHANOL: Alcohol, Ethyl (B): <10 mg/dL (ref <10)

## 2018-01-09 LAB — INFLUENZA PANEL BY PCR (TYPE A & B)
Influenza A By PCR: NEGATIVE
Influenza B By PCR: NEGATIVE

## 2018-01-09 LAB — APTT: aPTT: 27 seconds (ref 24–36)

## 2018-01-09 LAB — BRAIN NATRIURETIC PEPTIDE: B Natriuretic Peptide: 129.6 pg/mL — ABNORMAL HIGH (ref 0.0–100.0)

## 2018-01-09 MED ORDER — FLUTICASONE PROPIONATE 50 MCG/ACT NA SUSP
1.0000 | Freq: Two times a day (BID) | NASAL | Status: DC
  Administered 2018-01-09 – 2018-01-10 (×2): 1 via NASAL
  Filled 2018-01-09: qty 16

## 2018-01-09 MED ORDER — ACETAMINOPHEN 650 MG RE SUPP: 650.0000 mg | RECTAL | Status: DC | PRN

## 2018-01-09 MED ORDER — PANTOPRAZOLE SODIUM 40 MG PO TBEC
40.0000 mg | DELAYED_RELEASE_TABLET | Freq: Every day | ORAL | Status: DC
  Administered 2018-01-10: 40 mg via ORAL
  Filled 2018-01-09 (×2): qty 1

## 2018-01-09 MED ORDER — VENLAFAXINE HCL ER 75 MG PO CP24
75.0000 mg | ORAL_CAPSULE | Freq: Every day | ORAL | Status: DC
  Administered 2018-01-10: 75 mg via ORAL
  Filled 2018-01-09: qty 1

## 2018-01-09 MED ORDER — IPRATROPIUM-ALBUTEROL 0.5-2.5 (3) MG/3ML IN SOLN
3.0000 mL | Freq: Once | RESPIRATORY_TRACT | Status: AC
  Administered 2018-01-09: 3 mL via RESPIRATORY_TRACT
  Filled 2018-01-09: qty 3

## 2018-01-09 MED ORDER — AMOXICILLIN-POT CLAVULANATE 875-125 MG PO TABS
1.0000 | ORAL_TABLET | Freq: Two times a day (BID) | ORAL | Status: DC
  Administered 2018-01-09 – 2018-01-10 (×3): 1 via ORAL
  Filled 2018-01-09 (×3): qty 1

## 2018-01-09 MED ORDER — ACETAMINOPHEN 160 MG/5ML PO SOLN: 650.0000 mg | ORAL | Status: DC | PRN

## 2018-01-09 MED ORDER — POTASSIUM CHLORIDE CRYS ER 20 MEQ PO TBCR
40.0000 meq | EXTENDED_RELEASE_TABLET | Freq: Two times a day (BID) | ORAL | Status: DC
  Administered 2018-01-09 – 2018-01-10 (×2): 40 meq via ORAL
  Filled 2018-01-09 (×3): qty 2

## 2018-01-09 MED ORDER — LORATADINE 10 MG PO TABS
10.0000 mg | ORAL_TABLET | Freq: Every day | ORAL | Status: DC | PRN
  Administered 2018-01-09: 10 mg via ORAL
  Filled 2018-01-09: qty 1

## 2018-01-09 MED ORDER — SENNA 8.6 MG PO TABS
1.0000 | ORAL_TABLET | Freq: Two times a day (BID) | ORAL | Status: DC
  Administered 2018-01-09 – 2018-01-10 (×2): 8.6 mg via ORAL
  Filled 2018-01-09 (×2): qty 1

## 2018-01-09 MED ORDER — ONDANSETRON HCL 4 MG/2ML IJ SOLN: 4.0000 mg | Freq: Four times a day (QID) | INTRAMUSCULAR | Status: DC | PRN

## 2018-01-09 MED ORDER — PREDNISONE 20 MG PO TABS
50.0000 mg | ORAL_TABLET | Freq: Every day | ORAL | Status: DC
  Administered 2018-01-10: 50 mg via ORAL
  Filled 2018-01-09: qty 3

## 2018-01-09 MED ORDER — IPRATROPIUM-ALBUTEROL 0.5-2.5 (3) MG/3ML IN SOLN: 3.0000 mL | RESPIRATORY_TRACT | Status: DC | PRN

## 2018-01-09 MED ORDER — POTASSIUM CHLORIDE CRYS ER 20 MEQ PO TBCR: 40.0000 meq | EXTENDED_RELEASE_TABLET | Freq: Two times a day (BID) | ORAL | Status: DC

## 2018-01-09 MED ORDER — LEVOTHYROXINE SODIUM 50 MCG PO TABS
50.0000 ug | ORAL_TABLET | Freq: Every day | ORAL | Status: DC
  Administered 2018-01-10: 50 ug via ORAL
  Filled 2018-01-09: qty 1

## 2018-01-09 MED ORDER — QUETIAPINE FUMARATE 25 MG PO TABS
25.0000 mg | ORAL_TABLET | Freq: Two times a day (BID) | ORAL | Status: DC
  Administered 2018-01-09 – 2018-01-10 (×2): 25 mg via ORAL
  Filled 2018-01-09 (×2): qty 1

## 2018-01-09 MED ORDER — ASPIRIN EC 81 MG PO TBEC
81.0000 mg | DELAYED_RELEASE_TABLET | Freq: Every morning | ORAL | Status: DC
  Administered 2018-01-09: 81 mg via ORAL
  Filled 2018-01-09 (×2): qty 1

## 2018-01-09 MED ORDER — STROKE: EARLY STAGES OF RECOVERY BOOK
Freq: Once | Status: AC
  Administered 2018-01-09: 17:00:00
  Filled 2018-01-09: qty 1

## 2018-01-09 MED ORDER — BENZONATATE 100 MG PO CAPS
200.0000 mg | ORAL_CAPSULE | ORAL | Status: DC | PRN
  Administered 2018-01-09 – 2018-01-10 (×3): 200 mg via ORAL
  Filled 2018-01-09 (×3): qty 2

## 2018-01-09 MED ORDER — OSELTAMIVIR PHOSPHATE 75 MG PO CAPS
75.0000 mg | ORAL_CAPSULE | ORAL | Status: DC
  Administered 2018-01-09: 75 mg via ORAL
  Filled 2018-01-09: qty 1

## 2018-01-09 MED ORDER — ENOXAPARIN SODIUM 40 MG/0.4ML ~~LOC~~ SOLN
40.0000 mg | SUBCUTANEOUS | Status: DC
  Administered 2018-01-09 – 2018-01-10 (×2): 40 mg via SUBCUTANEOUS
  Filled 2018-01-09 (×2): qty 0.4

## 2018-01-09 MED ORDER — ONDANSETRON HCL 4 MG PO TABS: 4.0000 mg | ORAL_TABLET | Freq: Four times a day (QID) | ORAL | Status: DC | PRN

## 2018-01-09 MED ORDER — IPRATROPIUM-ALBUTEROL 0.5-2.5 (3) MG/3ML IN SOLN
3.0000 mL | RESPIRATORY_TRACT | Status: DC
  Administered 2018-01-09: 3 mL via RESPIRATORY_TRACT
  Filled 2018-01-09: qty 3

## 2018-01-09 MED ORDER — ATORVASTATIN CALCIUM 40 MG PO TABS
40.0000 mg | ORAL_TABLET | Freq: Every day | ORAL | Status: DC
  Administered 2018-01-09 – 2018-01-10 (×2): 40 mg via ORAL
  Filled 2018-01-09 (×2): qty 1

## 2018-01-09 MED ORDER — FUROSEMIDE 20 MG PO TABS: 20.0000 mg | ORAL_TABLET | Freq: Every day | ORAL | Status: DC

## 2018-01-09 MED ORDER — LORAZEPAM 2 MG/ML IJ SOLN
1.0000 mg | Freq: Once | INTRAMUSCULAR | Status: AC | PRN
  Administered 2018-01-09: 1 mg via INTRAVENOUS
  Filled 2018-01-09: qty 1

## 2018-01-09 MED ORDER — CLOPIDOGREL BISULFATE 75 MG PO TABS
75.0000 mg | ORAL_TABLET | Freq: Every day | ORAL | Status: DC
  Filled 2018-01-09: qty 1

## 2018-01-09 MED ORDER — IOPAMIDOL (ISOVUE-370) INJECTION 76%
INTRAVENOUS | Status: AC
  Administered 2018-01-09: 50 mL
  Filled 2018-01-09: qty 50

## 2018-01-09 MED ORDER — METOPROLOL TARTRATE 25 MG PO TABS: 25.0000 mg | ORAL_TABLET | Freq: Two times a day (BID) | ORAL | Status: DC

## 2018-01-09 MED ORDER — ACETAMINOPHEN 325 MG PO TABS
650.0000 mg | ORAL_TABLET | ORAL | Status: DC | PRN
  Administered 2018-01-09 – 2018-01-10 (×2): 650 mg via ORAL
  Filled 2018-01-09 (×2): qty 2

## 2018-01-09 MED ORDER — CLOPIDOGREL BISULFATE 75 MG PO TABS
300.0000 mg | ORAL_TABLET | Freq: Once | ORAL | Status: AC
  Administered 2018-01-09: 300 mg via ORAL
  Filled 2018-01-09: qty 4

## 2018-01-09 NOTE — ED Provider Notes (Signed)
Emergency Department Provider Note   I have reviewed the triage vital signs and the nursing notes.   HISTORY  Chief Complaint Chest Pain   HPI Erica Morris is a 72 y.o. female with PMH of HTN, NSTEMI with clean cath 10/2017, and anxiety to the emergency department for evaluation of multiple complaints including face pain, mild dyspnea, chest pressure.  Family bedside state that patient is actually here because she called in this morning and could not get out of bed.  They came over to see her and she was not moving the right side of her body.  They tried to tickle her feet and she had no response on the right and seemed like her speech was slurring.  Patient was last seen normal prior to going to bed last night and woke up with the symptoms.  Family reports a TIA 15-20 years ago but no recent strokes.  Family state that EMS was called and around the time that they arrived the symptoms seem to be rapidly improving.  Patient is mostly concerned about her right face pain and congestion which is been present for several days to week.  Chest pain is also intermittent and mostly chronic.  She is being evaluated by her PCP for dyspnea and in the process of making a COPD diagnosis from secondhand smoke. She is currently on prednisone and Doxy after seeing PCP on 2/7.     Past Medical History:  Diagnosis Date  . ABDOMINAL PAIN, RECURRENT 03/29/2008  . ANXIETY 10/07/2007  . Candidiasis of mouth 05/30/2010  . GERD 07/25/2007  . Hernia, hiatal   . HYPERTENSION 07/25/2007  . NSTEMI (non-ST elevated myocardial infarction) ([place]) 10/2017  . Stroke [facility])     Patient Active Problem List   Diagnosis Date Noted  . COPD suggested by initial evaluation ([place]) 01/06/2018  . Cough 10/28/2017  . Chest pain 10/28/2017  . NSTEMI (non-ST elevated myocardial infarction) ([place]) 10/28/2017  . Vertigo 08/10/2017  . Diastolic CHF ([place]) 16/08/9603  . Osteoarthritis of spine with radiculopathy, cervical region  03/10/2017  . Hypothyroidism 11/17/2016  . Episode of recurrent major depressive disorder ([place])   . History of CVA (cerebrovascular accident)   . Pure hypercholesterolemia 11/06/2016  . Chronic venous insufficiency 11/05/2016  . Peripheral neuropathy 11/05/2016  . Cerebrovascular disease 06/09/2016  . Orthostatic hypotension 06/09/2016  . Hypokalemia 02/20/2015  . History of colonic polyps 06/04/2014  . Depression 03/29/2014  . Chronic constipation 03/29/2014  . Abdominal pain 03/29/2008  . Anxiety state 10/07/2007  . Essential hypertension 07/25/2007  . GERD 07/25/2007    Past Surgical History:  Procedure Laterality Date  . ABDOMINAL HYSTERECTOMY    . BREAST SURGERY     bx  . KNEE ARTHROSCOPY    . LEFT HEART CATH AND CORONARY ANGIOGRAPHY N/A 11/02/2017   Procedure: LEFT HEART CATH AND CORONARY ANGIOGRAPHY;  Surgeon: Burnell Blanks, MD;  Location: [facility];  Service: Cardiovascular;  Laterality: N/A;    Current Outpatient Rx  . Order #: [id number] Class: Normal  . Order #: [id number] Class: Historical Med  . Order #: [id number] Class: Normal  . Order #: [id number] Class: Normal  . Order #: [id number] Class: Normal  . Order #: [id number] Class: Normal  . Order #: [id number] Class: Normal  . Order #: [id number] Class: Normal  . Order #: [id number] Class: Normal  . Order #: [id number] Class: Normal  . Order #: [id number] Class: Normal  . Order #: [id number] Class: Normal  . Order #: [id number] Class: Normal  . Order #: [id number] Class:  Normal  . Order #: [id number] Class: Normal  . Order #: [id number] Class: Normal  . Order #: [id number] Class: Normal    Allergies Lisinopril; Codeine phosphate; and Morphine and related  Family History  Problem Relation Age of Onset  . Dementia Mother   . Alcoholism Mother   . COPD Father   . Diabetes Sister   . Hypertension Sister   . Cancer Brother        Lung CA  . COPD Brother   . Clotting disorder Brother   . Kidney disease Neg Hx   .  Stroke Neg Hx     Social History Social History   Tobacco Use  . Smoking status: Never Smoker  . Smokeless tobacco: Never Used  Substance Use Topics  . Alcohol use: Yes    Comment: occasional  . Drug use: No    Review of Systems  Constitutional: No fever/chills Eyes: No visual changes. ENT: No sore throat. Positive right face pain.  Cardiovascular: Positive chest pain. Respiratory: Positive shortness of breath. Gastrointestinal: No abdominal pain.  No nausea, no vomiting.  No diarrhea.  No constipation. Genitourinary: Negative for dysuria. Musculoskeletal: Negative for back pain. Skin: Negative for rash. Neurological: Negative for headaches. Right arm/leg weakness this AM with slurred speech.   10-point ROS otherwise negative.  ____________________________________________   PHYSICAL EXAM:  VITAL SIGNS: ED Triage Vitals  Enc Vitals Group     BP 01/09/18 0708 (!) 165/75     Pulse Rate 01/09/18 0708 (!) 51     Resp 01/09/18 0708 18     Temp 01/09/18 0708 99 F (37.2 C)     Temp Source 01/09/18 0708 Oral     SpO2 01/09/18 0708 99 %     Weight 01/09/18 0721 179 lb (81.2 kg)     Height 01/09/18 0721 5\' 4"  (1.626 m)     Pain Score 01/09/18 0718 10   Constitutional: Alert and oriented. Well appearing and in no acute distress. Eyes: Conjunctivae are normal. PERRL. EOMI. Head: Atraumatic. Nose: No congestion/rhinnorhea. Mouth/Throat: Mucous membranes are moist. Neck: No stridor. Cardiovascular: Normal rate, regular rhythm. Good peripheral circulation. Grossly normal heart sounds.   Respiratory: Normal respiratory effort.  No retractions. Lungs CTAB. Gastrointestinal: Soft and nontender. No distention.  Musculoskeletal: No lower extremity tenderness nor edema. No gross deformities of extremities. Neurologic:  Normal speech and language. Decreased sensation to light touch over the right face, arm, and leg. Normal finger-to-nose. No pronator drift.  Skin:  Skin is warm,  dry and intact. No rash noted.   ____________________________________________   LABS (all labs ordered are listed, but only abnormal results are displayed)  Labs Reviewed  COMPREHENSIVE METABOLIC PANEL - Abnormal; Notable for the following components:      Result Value   Potassium 2.9 (*)    Glucose, Bld 103 (*)    BUN 21 (*)    Calcium 8.8 (*)    Total Protein 6.0 (*)    ALT 10 (*)    GFR calc non Af Amer 58 (*)    All other components within normal limits  URINALYSIS, ROUTINE W REFLEX MICROSCOPIC - Abnormal; Notable for the following components:   Color, Urine AMBER (*)    APPearance HAZY (*)    Protein, ur 30 (*)    Leukocytes, UA LARGE (*)    Bacteria, UA RARE (*)    Squamous Epithelial / LPF 0-5 (*)    All other components within normal limits  CBC WITH DIFFERENTIAL/PLATELET  PROTIME-INR  APTT  RAPID URINE DRUG SCREEN, HOSP PERFORMED  ETHANOL  I-STAT TROPONIN, ED  I-STAT TROPONIN, ED   ____________________________________________  EKG   EKG Interpretation  Date/Time:  Sunday January 09 2018 07:07:39 EST Ventricular Rate:  52 PR Interval:    QRS Duration: 97 QT Interval:  458 QTC Calculation: 426 R Axis:   32 Text Interpretation:  Sinus rhythm Borderline repol abnormality, diffuse leads ST changes similar to prior. No STEMI.  Confirmed by Nanda Quinton [phone]) on 01/09/2018 7:18:05 AM Also confirmed by Nanda Quinton [phone]), editor Philomena Doheny [phone])  on 01/09/2018 8:19:40 AM       ____________________________________________  RADIOLOGY  Dg Chest 2 View  Result Date: 01/09/2018 CLINICAL DATA:  Acute chest pain today.  Initial encounter. EXAM: CHEST  2 VIEW COMPARISON:  01/07/2018 and prior radiographs FINDINGS: Upper limits normal heart size noted. There is no evidence of focal airspace disease, pulmonary edema, suspicious pulmonary nodule/mass, pleural effusion, or pneumothorax. No acute bony abnormalities are identified. IMPRESSION: No active  cardiopulmonary disease. Electronically Signed   By: Margarette Canada M.D.   On: 01/09/2018 08:42   Ct Head Wo Contrast  Result Date: 01/09/2018 CLINICAL DATA:  Woke up this morning with headache and dizziness. EXAM: CT HEAD WITHOUT CONTRAST TECHNIQUE: Contiguous axial images were obtained from the base of the skull through the vertex without intravenous contrast. COMPARISON:  11/18/2017 FINDINGS: Brain: No evidence of acute infarction, hemorrhage, hydrocephalus, extra-axial collection or mass lesion/mass effect. Chronic asymmetric widening of the occipital parietal sulci and sylvian fissures that is nonspecific. No associated hydrocephalus. Chronic small vessel ischemia in the cerebral white matter. Vascular: Atherosclerotic calcification.  No hyperdense vessel. Skull: Negative Sinuses/Orbits: Bilateral cataract resection. No pathologic finding. IMPRESSION: 1. No acute finding.  Stable compared to 2018. 2. Mild chronic small vessel ischemia. Electronically Signed   By: Monte Fantasia M.D.   On: 01/09/2018 08:36   Mr Brain Wo Contrast  Result Date: 01/09/2018 CLINICAL DATA:  Right facial tingling. Numbness and right arm weakness this morning. EXAM: MRI HEAD WITHOUT CONTRAST TECHNIQUE: Multiplanar, multiecho pulse sequences of the brain and surrounding structures were obtained without intravenous contrast. COMPARISON:  Head CT from earlier today.  Brain MRI 03/23/2016 FINDINGS: Brain: No acute infarction, hemorrhage, hydrocephalus, extra-axial collection or mass lesion. Indistinct T2 hyperintensity across the brainstem and patchy in the bilateral cerebral white matter most consistent with chronic small vessel ischemia given demographics and medical history. Age normal brain volume. There is nonspecific asymmetric widening of sylvian fissures and right occipital sulci, nonspecific. No finding seen at the level of the cisterns or [place]. Vascular: Major flow voids are preserved. Skull and upper cervical  spine: No evidence of marrow lesion. Sinuses/Orbits: Bilateral cataract resection.  No acute finding. IMPRESSION: 1. No acute finding, including infarct. 2. Chronic small vessel ischemia in the cerebral white matter and pons without progression since 2017. Electronically Signed   By: Monte Fantasia M.D.   On: 01/09/2018 10:32    ____________________________________________   PROCEDURES  Procedure(s) performed:   Procedures  None ____________________________________________   INITIAL IMPRESSION / ASSESSMENT AND PLAN / ED COURSE  Pertinent labs & imaging results that were available during my care of the patient were reviewed by me and considered in my medical decision making (see chart for details).  Patient presents to the emergency department for evaluation of multiple complaints but ultimately presented to the ED because of waking up this morning with right-sided weakness and numbness to the point she cannot  get out of bed.  Symptoms have mostly resolved but she continues to have decreased sensation to light touch over the right arm and leg.  No appreciable weakness.  Normal finger to nose testing.  Normal cranial nerve exam, with the exception of decreased sensation of the right face.  Has multiple risk factors for stroke reports past history of TIA 15-20 years ago.  Plan for stroke evaluation.  We will also add on troponin given the patient's intermittent chest pressure although she did have a clean heart catheterization in December 2018.  Patient seems very concerned about her right face pain which seems most consistent with sinusitis but also considering trigeminal neuralgia.   Reviewed CT/MRI imaging and labs. I have concern for TIA. Spoke with Dr. Leonel Ramsay who will consult. Plan for Family Med Admit.   Discussed patient's case with Family Med to request admission. Patient and family (if present) updated with plan. Care transferred to [facility] Medicine service.  I reviewed all  nursing notes, vitals, pertinent old records, EKGs, labs, imaging (as available).  ____________________________________________  FINAL CLINICAL IMPRESSION(S) / ED DIAGNOSES  Final diagnoses:  TIA (transient ischemic attack)  Face pain  Chest pressure     MEDICATIONS GIVEN DURING THIS VISIT:  Medications   stroke: mapping our early stages of recovery book (not administered)  ipratropium-albuterol (DUONEB) 0.5-2.5 (3) MG/3ML nebulizer solution 3 mL (3 mLs Nebulization Given 01/09/18 0856)  LORazepam (ATIVAN) injection 1 mg (1 mg Intravenous Given 01/09/18 0919)     Note:  This document was prepared using Dragon voice recognition software and may include unintentional dictation errors.  Nanda Quinton, MD Emergency Medicine\    Long, [facility], MD 01/09/18 [phone]

## 2018-01-09 NOTE — Consult Note (Signed)
Referring Physician: Margette Fast, MD    Chief Complaint:right sided weakness  HPI: Erica Morris is an 72 y.o. female significant for hypertension, hyperlipidemia, stroke, GERD, hypothyroidism,dCHF, COPD, Depression, TIA 15-20 years ago,  recent STEMI in 10/2017 with clean cath,   history of CVA several years ago with persistent deficits of right facial paresthesias, who presents to the ED today with acute onset right sided paresthesia and weakness. Last night patient went to bed normal and around 3:45 AM she she woke up with paresthesias and weakness around her mouth, right side of her face, right arm and leg.  She was unable to get out of bed or move her right side, so she called her daughter.  Per daughter, she found patient in bed with facial droop and drooling from her right side, slurred speech, loss of sensation in her right leg, no movement and limpness in her right extremities, she had associated nausea vomiting and at that time patient was complaining of chest pain and tightness which radiated into her right shoulder with arm numbness and tingling.  Patient admitted to having black floaters in her eyes, but denied blurred vision, headache, dizziness or loss of consciousness.  Due to these findings her daughter called EMS and patient was brought to the hospital.  EMS arrived to the hospital patient's blood pressure was 208/125, and has since improved spontaneously.  By the time patient came to the hospital symptoms of right extremity weakness had resolved but basline right sided paresthesia continued to persist, and states symptoms are waxing and waning..  She denies fever, chills or recent trauma to her head.   Initial CT of the head was negative, and so was MRI of the brain but both did show chronic small vessel ischemia in the cerebral white matter and pons.  Patient is awake alert and oriented in no distress, with mentation and concentration intact. Over the last several days patient has  been complaining of congestion, dyspnea, chest pressure with cough with white sputum production, to see her PCP on 01/06/18, and was treated for bronchitis with doxycycline and prednisone.  Family over the last several days she has had some mild confusion such as being "scatterbrained" without word finding difficulties.  At baseline patient had right facial paresthesia,  leg weakness and has been stumbling occasionally when she walks for over a year now. Neuro was consulted for evaluation of TIA  LSN: yesterday evening before  bed tPA Given: No: Symptoms rapidly improving  Premorbid modified Rankin scale (mRS):1    Past Medical History:  Diagnosis Date  . ABDOMINAL PAIN, RECURRENT 03/29/2008  . ANXIETY 10/07/2007  . Candidiasis of mouth 05/30/2010  . GERD 07/25/2007  . Hernia, hiatal   . HYPERTENSION 07/25/2007  . NSTEMI (non-ST elevated myocardial infarction) ([place]) 10/2017  . Stroke [facility])     Past Surgical History:  Procedure Laterality Date  . ABDOMINAL HYSTERECTOMY    . BREAST SURGERY     bx  . KNEE ARTHROSCOPY    . LEFT HEART CATH AND CORONARY ANGIOGRAPHY N/A 11/02/2017   Procedure: LEFT HEART CATH AND CORONARY ANGIOGRAPHY;  Surgeon: Burnell Blanks, MD;  Location: [facility];  Service: Cardiovascular;  Laterality: N/A;    Family History  Problem Relation Age of Onset  . Dementia Mother   . Alcoholism Mother   . COPD Father   . Diabetes Sister   . Hypertension Sister   . Cancer Brother        Lung  CA  . COPD Brother   . Clotting disorder Brother   . Kidney disease Neg Hx   . Stroke Neg Hx    Social History:  reports that  has never smoked. she has never used smokeless tobacco. She reports that she drinks alcohol. She reports that she does not use drugs.  Allergies:  Allergies  Allergen Reactions  . Lisinopril Cough  . Codeine Phosphate Itching  . Morphine And Related Itching    Burning sensation and itchiness with IV morphine    Medications:   Scheduled: .  stroke: mapping our early stages of recovery book   Does not apply Once    ROS: 13 point systems reviewed and are negative except for above in HPI  Physical Examination: Blood pressure (!) 148/99, pulse 68, temperature 97.8 F (36.6 C), resp. rate (!) 21, height _0  (1.626 m), weight 81.2 kg (179 lb), SpO2 100 %. HEENT-  Normocephalic, no lesions, without obvious abnormality.  Normal external eye and conjunctiva.  Cardiovascular- S1-S2 audible, pulses palpable throughout   Lungs-coarse breath sounds with nonlabored breathing.  She is chronically coughing white sputum saturations within normal limits Abdomen- All 4 quadrants palpated and nontender Musculoskeletal-no joint tenderness, deformity or swelling Skin-warm and dry,   Neurological Examination Mental Status: Alert, oriented, thought content appropriate.  Speech fluent without evidence of aphasia.  Able to follow 3 step commands without difficulty. Cranial Nerves: II: Discs flat bilaterally; Visual fields grossly normal,  III,IV, VI: ptosis not present, extra-ocular motions intact bilaterally pupils equal, round, reactive to light and accommodation V,VII: smile symmetric, facial light touch sensation normal bilaterally VIII: Hearing intact to voice IX,X: uvula rises symmetrically XI: bilateral shoulder shrug XII: midline tongue extension Motor: Right : Upper extremity   5/5    Left:     Upper extremity   5/5  Lower extremity   5/5     Lower extremity   5/5 Tone and bulk:normal tone throughout; no atrophy noted Sensory: Pinprick and light touch decreased right side of face, right arm and right leg  Deep Tendon Reflexes: 2+ and symmetric throughout Plantars: Right: downgoing   Left: downgoing Cerebellar: normal finger-to-nose, normal rapid alternating movements and normal heel-to-shin test Gait: normal gait and station  NIHSS 1a Level of Conscious.: 0 1b LOC Questions: 0 1c LOC Commands: 0 2 Best Gaze:  0 3 Visual: 0 4 Facial Palsy: 0 5a Motor Arm - left: 0 5b Motor Arm - Right: 0 6a Motor Leg - Left: 0 6b Motor Leg - Right: 0 7 Limb Ataxia: 0 8 Sensory: 1 9 Best Language: 0 10 Dysarthria: 0 11 Extinct. and Inatten.: 0 TOTAL: 1  Results for orders placed or performed during the hospital encounter of 01/09/18 (from the past 48 hour(s))  Comprehensive metabolic panel     Status: Abnormal   Collection Time: 01/09/18  8:47 AM  Result Value Ref Range   Sodium 139 135 - 145 mmol/L   Potassium 2.9 (L) 3.5 - 5.1 mmol/L   Chloride 101 101 - 111 mmol/L   CO2 24 22 - 32 mmol/L   Glucose, Bld 103 (H) 65 - 99 mg/dL   BUN 21 (H) 6 - 20 mg/dL   Creatinine, Ser 0.97 0.44 - 1.00 mg/dL   Calcium 8.8 (L) 8.9 - 10.3 mg/dL   Total Protein 6.0 (L) 6.5 - 8.1 g/dL   Albumin 3.5 3.5 - 5.0 g/dL   AST 21 15 - 41 U/L   ALT 10 (L) 14 -  54 U/L   Alkaline Phosphatase 85 38 - 126 U/L   Total Bilirubin 0.6 0.3 - 1.2 mg/dL   GFR calc non Af Amer 58 (L) >60 mL/min   GFR calc Af Amer >60 >60 mL/min    Comment: (NOTE) The eGFR has been calculated using the CKD EPI equation. This calculation has not been validated in all clinical situations. eGFR's persistently <60 mL/min signify possible Chronic Kidney Disease.    Anion gap 14 5 - 15    Comment: Performed at [facility] [address], [place], [place] [address]  CBC with Differential     Status: None   Collection Time: 01/09/18  8:47 AM  Result Value Ref Range   WBC 8.9 4.0 - 10.5 K/uL   RBC 4.64 3.87 - 5.11 MIL/uL   Hemoglobin 12.2 12.0 - 15.0 g/dL   HCT 38.4 36.0 - 46.0 %   MCV 82.8 78.0 - 100.0 fL   MCH 26.3 26.0 - 34.0 pg   MCHC 31.8 30.0 - 36.0 g/dL   RDW 14.5 11.5 - 15.5 %   Platelets 238 150 - 400 K/uL   Neutrophils Relative % 67 %   Neutro Abs 6.0 1.7 - 7.7 K/uL   Lymphocytes Relative 27 %   Lymphs Abs 2.4 0.7 - 4.0 K/uL   Monocytes Relative 6 %   Monocytes Absolute 0.5 0.1 - 1.0 K/uL   Eosinophils Relative 0 %   Eosinophils  Absolute 0.0 0.0 - 0.7 K/uL   Basophils Relative 0 %   Basophils Absolute 0.0 0.0 - 0.1 K/uL    Comment: Performed at [facility], [place] [address] [place], [place] [address]  Protime-INR     Status: None   Collection Time: 01/09/18  8:47 AM  Result Value Ref Range   Prothrombin Time 13.8 11.4 - 15.2 seconds   INR 1.07     Comment: Performed at [facility] [address] [place], [place] [address]  APTT     Status: None   Collection Time: 01/09/18  8:47 AM  Result Value Ref Range   aPTT 27 24 - 36 seconds    Comment: Performed at [facility] [address], [place], [place] [address]  I-stat troponin, ED     Status: None   Collection Time: 01/09/18  9:08 AM  Result Value Ref Range   Troponin i, poc 0.01 0.00 - 0.08 ng/mL   Comment 3            Comment: Due to the release kinetics of cTnI, a negative result within the first hours of the onset of symptoms does not rule out myocardial infarction with certainty. If myocardial infarction is still suspected, repeat the test at appropriate intervals.    Dg Chest 2 View 2/101/10 IMPRESSION: No active cardiopulmonary disease.   Ct Head Wo Contrast  01/09/2018 IMPRESSION:  1. No acute finding.  Stable compared to 2018.  2. Mild chronic small vessel ischemia.   MRI Brain 01/09/18 IMPRESSION: 1. No acute finding, including infarct. 2. Chronic small vessel ischemia in the cerebral white matter and pons without progression since 2017.  Assessment: 72 y.o. female ignificant for hypertension, hyperlipidemia, stroke, GERD, hypothyroidism,dCHF, COPD, Depression, TIA 15-20 years ago,  recent STEMI in 10/2017 with clean cath,   who presents to the ED today with acute onset right sided paresthesia and weakness.   1.  TIA-patient came in with symptoms of right facial droop, slurred speech, right-sided  paresthesia and weakness in her face, right arm.  All the symptoms have resolved except for the baseline paresthesias in  the right side of her face face and extremities.  CT of the head and MRI of the brain was negative for any acute intracranial processes except for chronic small vessel ischemia.  We will continue to pursue full TIA workup CTA of the head and neck as well as echocardiogram to rule out post viral cardiomyopathy as patient has had persistent bronchitis for the last several months.  Last echo was done in December 2018 when patient was admitted with an MI-Echo was within normal limits.  Due to the waxing and waning nature of the paresthesias in his symptoms, will Plavix load and continue dual antiplatelet therapy with aspirin and Plavix for secondary stroke prevention.  We will continue her current regimen of atorvastatin  Atorvastatin 40 mg pending lipid panel results..  We will consult rehab services physical therapy, occupational therapy and speech therapy for further evaluation as well as continue neurochecks. 2.  Accelerated  hypertension 3.  Bronchitis 4.  History of CAD with recent NSTEMI 6.  History of stroke  Stroke Risk Factors - hyperlipidemia and hypertension  Plan: - HgbA1c, fasting lipid panel - CTA of the head and neck  - PT consult, OT consult, Speech consult - Echocardiogram - Prophylactic therapy-Antiplatelet med: Aspirin - dose 58m, Plavix Load 3037m and continue 7561mlavix daily - Atorvastatin 37m70mRisk factor modification - Telemetry monitoring -  Frequent neuro checks -Continue cardioprotective medications -Maintain hemodynamic stability   [facility] Neuro-hospitalist Team [phone], 9:57 AM

## 2018-01-09 NOTE — ED Triage Notes (Signed)
EMS called to PT home with a report of CP . On arrival Pt reported CP central chest a tightness , Pain radiated to back,neck and shoulder. Pain reported to bed 10/10. Pt received 324 ASA and 2 nitro SL from EMS. BP 204/100 prior to NG and 146/88 after NG. HRF in the 50's. Pt is also reporting nasal congestion and a cough for a while.

## 2018-01-09 NOTE — ED Triage Notes (Signed)
PT to MRI dept.

## 2018-01-09 NOTE — H&P (Signed)
[facility] Admission History and Physical Service Pager: [phone]  Patient name: Erica Morris Medical record number: [id number] Date of birth: [birth year] Age: 72 y.o. Gender: female  Primary Care Provider: Zenia Resides, MD Consultants: Neurology Code Status: Full  Chief Complaint: "Couldn't get out of bed"  Assessment and Plan: BRAXTYN DORFF is a 73 y.o. female presenting with right sided weakness upon waking up this morning. PMH is significant for hypertension, hyperlipidemia, stroke, GERD, hypothyroidism, dCHF, COPD, Depression.  Right Sided Weakness/TIA: Acute. Stable. Most likely a TIA. Patient has a history of stroke "10-15" years ago and states her symptoms from this morning felt similar. Her symptoms have resolved upon admission. Unlikely to be caused from intoxication/substance abuse as UDS and Ethanol negative. CT of head showed no acute findings, only chronic mild small vessel ischemia. MRI showed no acute finding with chronic small vessel ischemia in the cerebral white matter and pons with no progression since 2017. Neuro exam on admission was within normal limits.  - Admit Observation - vitals per floor - Neurology following; appreciate recs - Neuro checks q4 - Bedside swallow - Risk factor labs (Lipid panel, HgbA1c, TSH) pending - Echo pending - CTA head and neck - Carotid U/S Doppler pending  Shortness of Breath/Cough: Chronic. Patient was recently seen in our clinic by Dr. Andria Frames and told she likely has a COPD exacerbation. She was started on Doxycycline and Prednisone and was taking neb treatments at home. She states she has been having increased episodes of coughing with shortness of breath for 3 weeks. She has pleuritic chest pain, Wells score of 1.5 (low risk) and no tachypnea, tachycardia, or desaturation making PE unlikely. Patient endorses PND, no orthopnea, CXR showed no signs of CHF (no interstitial edema or cardiomegaly) and on  exam she does not appear to be volume overloaded. BNP is pending. Patient has WBC nl limits, no reports of fever and is afebrile on admission and CXR showed no consolidation or pulmonary edema - Duonebs q4 - Cont home Fluticasone daily - Cont home Loratadine 10mg   - Cont Prednisone 50mg  (2/7-) for a five day course; stop on 2/11 - Stopped Doxycycline, giving Augmentin to cover for Sinusitis - Cont Tessalon pearls - BNP pending - Obtain influenza panel PCR to rule out flu  Right sided facial pain: Acute: Patient states this began this morning but has had nasal congestion, runny nose for 3 months. Possible it could be lingering viral infection but concern about bacterial superinfection considering she has had this for over several weeks and she has new onset right sided frontal and maxillary sinuses - Augmentin 875mg -125mg  started for suspected Sinusitis  HTN: Chronic. SBP 126-165, DBP 73-99. Treat to normotensive goal. - Cont home Lasix 20mg  daily - Cont home Metoprolol 25mg  BID  GERD: Chronic. Well controlled on hom Protonix 40mg  - Cont Protonix 40mg  daily  HDL: Chronic. Current lipid panel pending - Cont home Atorvastatin 40mg   Hypothyroidism: Chronic. On home Levothyroxine. - TSH pending - Cont home Levothyroxine 39mcg  Anxiety: Chronic. Patient has long history of anxiety. - Cont home venlafaxine 75mg  daily - Cont home Quetiapine 25mg  BID  FEN/GI: Protonix/Reg diet after passing bedside swallow Prophylaxis: Lovenox  Disposition: Observation - Med-surg  History of Present Illness:  Erica Morris is a 72 y.o. female presenting this morning with inability to move. Patient called her daughter who lives in the same house. Her daughter states she came in the room - she states she was  not able to right arm and leg, she was drooling out of right sided of face. Patient had a headache last night.  Patient states she has been have trouble breathing for the past several weeks and has  gotten progressively worse. Patient has been coughing and feels SOB with pain on deep inspiration. She was seen in PCP office and treated for COPD exacerbation but she has not noticed much improvement. Her cough is productive for "white and yellow stuff".  Patient states she has had upper respiratory systems for 3 months but the right sided facial pain and tightness started "a few days ago".  Review Of Systems: Per HPI with the following additions:   Review of Systems  Constitutional: Negative for chills and fever.  HENT: Positive for congestion.   Eyes: Negative for blurred vision and double vision.  Respiratory: Positive for cough, sputum production and shortness of breath.   Cardiovascular: Positive for PND. Negative for chest pain and leg swelling.  Gastrointestinal: Positive for nausea and vomiting. Negative for abdominal pain and constipation.  Genitourinary: Negative for dysuria and urgency.  Neurological: Positive for sensory change and focal weakness.    Patient Active Problem List   Diagnosis Date Noted  . COPD suggested by initial evaluation ([place]) 01/06/2018  . Cough 10/28/2017  . Chest pain 10/28/2017  . NSTEMI (non-ST elevated myocardial infarction) ([place]) 10/28/2017  . Vertigo 08/10/2017  . Diastolic CHF ([place]) 37/08/6268  . 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 Hypothyroidism 11/17/2016  . Episode of recurrent major depressive disorder ([place])   . History of CVA (cerebrovascular accident)   . Pure hypercholesterolemia 11/06/2016  . Chronic venous insufficiency 11/05/2016  . Peripheral neuropathy 11/05/2016  . Cerebrovascular disease 06/09/2016  . Orthostatic hypotension 06/09/2016  . Hypokalemia 02/20/2015  . History of colonic polyps 06/04/2014  . Depression 03/29/2014  . Chronic constipation 03/29/2014  . Abdominal pain 03/29/2008  . Anxiety state 10/07/2007  . Essential hypertension 07/25/2007  . GERD 07/25/2007    Past  Medical History: Past Medical History:  Diagnosis Date  . ABDOMINAL PAIN, RECURRENT 03/29/2008  . ANXIETY 10/07/2007  . Candidiasis of mouth 05/30/2010  . GERD 07/25/2007  . Hernia, hiatal   . HYPERTENSION 07/25/2007  . NSTEMI (non-ST elevated myocardial infarction) ([place]) 10/2017  . Stroke [facility])     Past Surgical History: Past Surgical History:  Procedure Laterality Date  . ABDOMINAL HYSTERECTOMY    . BREAST SURGERY     bx  . KNEE ARTHROSCOPY    . LEFT HEART CATH AND CORONARY ANGIOGRAPHY N/A 11/02/2017   Procedure: LEFT HEART CATH AND CORONARY ANGIOGRAPHY;  Surgeon: Burnell Blanks, MD;  Location: [facility];  Service: Cardiovascular;  Laterality: N/A;    Social History: Social History   Tobacco Use  . Smoking status: Never Smoker  . Smokeless tobacco: Never Used  Substance Use Topics  . Alcohol use: Yes    Comment: occasional  . Drug use: No   Additional social history:   Please also refer to relevant sections of EMR.  Family History: Family History  Problem Relation Age of Onset  . Dementia Mother   . Alcoholism Mother   . COPD Father   . Diabetes Sister   . Hypertension Sister   . Cancer Brother        Lung CA  . COPD Brother   . Clotting disorder Brother   . Kidney disease Neg Hx   . Stroke Neg Hx  Allergies and Medications: Allergies  Allergen Reactions  . Lisinopril Cough  . Codeine Phosphate Itching  . Morphine And Related Itching    Burning sensation and itchiness with IV morphine   No current facility-administered medications on file prior to encounter.    Current Outpatient Medications on File Prior to Encounter  Medication Sig Dispense Refill  . albuterol (PROVENTIL HFA;VENTOLIN HFA) 108 (90 Base) MCG/ACT inhaler Inhale 2 puffs into the lungs every 4 (four) hours as needed for wheezing or shortness of breath. 1 Inhaler 1  . aspirin EC 81 MG tablet Take 81 mg by mouth every morning.     Marland Kitchen atorvastatin (LIPITOR) 40 MG tablet  TAKE 1 TABLET BY MOUTH  DAILY 90 tablet 3  . benzonatate (TESSALON) 200 MG capsule Take 1 capsule (200 mg total) by mouth 3 (three) times daily as needed for cough. 30 capsule 2  . doxycycline (VIBRA-TABS) 100 MG tablet Take 1 tablet (100 mg total) by mouth 2 (two) times daily. 20 tablet 0  . fluticasone (FLONASE) 50 MCG/ACT nasal spray Place 1 spray into both nostrils 2 (two) times daily. 16 g 2  . furosemide (LASIX) 20 MG tablet Take 1 tablet (20 mg total) by mouth daily. 90 tablet 3  . levothyroxine (SYNTHROID, LEVOTHROID) 50 MCG tablet Take 1 tablet (50 mcg total) daily before breakfast by mouth. 90 tablet 4  . loratadine (CLARITIN) 10 MG tablet Take 1 tablet (10 mg total) by mouth daily as needed for allergies. 90 tablet 3  . metoprolol tartrate (LOPRESSOR) 25 MG tablet Take 1 tablet (25 mg total) by mouth 2 (two) times daily. 180 tablet 3  . ondansetron (ZOFRAN) 4 MG tablet Take 1 tablet (4 mg total) by mouth every 8 (eight) hours as needed for nausea or vomiting. 10 tablet 0  . pantoprazole (PROTONIX) 40 MG tablet TAKE 1 TABLET BY MOUTH  DAILY 90 tablet 3  . polyethylene glycol (MIRALAX / GLYCOLAX) packet Take 17 g by mouth daily as needed. (Patient taking differently: Take 17 g by mouth daily as needed for mild constipation. ) 14 each 0  . predniSONE (DELTASONE) 50 MG tablet Take 1 tablet (50 mg total) by mouth daily with breakfast. 5 tablet 0  . QUEtiapine (SEROQUEL) 25 MG tablet Take 1 tablet (25 mg total) by mouth 2 (two) times daily. 60 tablet 3  . Spacer/Aero-Holding Chambers (AEROCHAMBER PLUS) inhaler Use as instructed 1 each 2  . venlafaxine XR (EFFEXOR XR) 75 MG 24 hr capsule Take 1 capsule (75 mg total) by mouth daily with breakfast. 90 capsule 3    Objective: BP (!) 148/99   Pulse 68   Temp 97.8 F (36.6 C)   Resp (!) 21   Ht 5\' 4"  (1.626 m)   Wt 179 lb (81.2 kg)   SpO2 100%   BMI 30.73 kg/m  Exam: Gen: Alert and Oriented x 3, NAD HEENT: Normocephalic, atraumatic,  PERRLA, EOMI, TM visible with good light reflex, non-swollen, mildly erythematous turbinates, non-erythematous pharyngeal mucosa, no exudates Neck: trachea midline, no thyroidmegaly, no LAD CV: RRR, no murmurs, normal S1, S2 split, +2 pulses dorsalis pedis bilaterally Resp: CTAB, no wheezing, rales, or rhonchi, comfortable work of breathing Abd: non-distended, non-tender, soft, +bs in all four quadrants, no hepatosplenomegaly MSK: FROM in all four extremities Ext: no clubbing, cyanosis, or edema Neuro: CN II-XII intact, no focal or gross deficits; RUE and LUE strength 5/5; RLE and LLE strength 5/5 Skin: warm, dry, intact, no rashes  Labs and  Imaging: CBC BMET  Recent Labs  Lab 01/09/18 0847  WBC 8.9  HGB 12.2  HCT 38.4  PLT 238   Recent Labs  Lab 01/09/18 0847  NA 139  K 2.9*  CL 101  CO2 24  BUN 21*  CREATININE 0.97  GLUCOSE 103*  CALCIUM 8.8*      Dg Chest 2 View  Result Date: 01/09/2018 CLINICAL DATA:  Acute chest pain today.  Initial encounter. EXAM: CHEST  2 VIEW COMPARISON:  01/07/2018 and prior radiographs FINDINGS: Upper limits normal heart size noted. There is no evidence of focal airspace disease, pulmonary edema, suspicious pulmonary nodule/mass, pleural effusion, or pneumothorax. No acute bony abnormalities are identified. IMPRESSION: No active cardiopulmonary disease. Electronically Signed   By: Margarette Canada M.D.   On: 01/09/2018 08:42   Dg Chest 2 View  Result Date: 01/07/2018 CLINICAL DATA:  Chest and back pain. EXAM: CHEST  2 VIEW COMPARISON:  November 18, 2017 FINDINGS: Mild atelectasis in the left base. The heart, hila, mediastinum, lungs, and pleura are otherwise normal. IMPRESSION: No active cardiopulmonary disease. Electronically Signed   By: Dorise Bullion III M.D   On: 01/07/2018 19:10   Ct Head Wo Contrast  Result Date: 01/09/2018 CLINICAL DATA:  Woke up this morning with headache and dizziness. EXAM: CT HEAD WITHOUT CONTRAST TECHNIQUE: Contiguous  axial images were obtained from the base of the skull through the vertex without intravenous contrast. COMPARISON:  11/18/2017 FINDINGS: Brain: No evidence of acute infarction, hemorrhage, hydrocephalus, extra-axial collection or mass lesion/mass effect. Chronic asymmetric widening of the occipital parietal sulci and sylvian fissures that is nonspecific. No associated hydrocephalus. Chronic small vessel ischemia in the cerebral white matter. Vascular: Atherosclerotic calcification.  No hyperdense vessel. Skull: Negative Sinuses/Orbits: Bilateral cataract resection. No pathologic finding. IMPRESSION: 1. No acute finding.  Stable compared to 2018. 2. Mild chronic small vessel ischemia. Electronically Signed   By: Monte Fantasia M.D.   On: 01/09/2018 08:36   Mr Brain Wo Contrast  Result Date: 01/09/2018 CLINICAL DATA:  Right facial tingling. Numbness and right arm weakness this morning. EXAM: MRI HEAD WITHOUT CONTRAST TECHNIQUE: Multiplanar, multiecho pulse sequences of the brain and surrounding structures were obtained without intravenous contrast. COMPARISON:  Head CT from earlier today.  Brain MRI 03/23/2016 FINDINGS: Brain: No acute infarction, hemorrhage, hydrocephalus, extra-axial collection or mass lesion. Indistinct T2 hyperintensity across the brainstem and patchy in the bilateral cerebral white matter most consistent with chronic small vessel ischemia given demographics and medical history. Age normal brain volume. There is nonspecific asymmetric widening of sylvian fissures and right occipital sulci, nonspecific. No finding seen at the level of the cisterns or [place]. Vascular: Major flow voids are preserved. Skull and upper cervical spine: No evidence of marrow lesion. Sinuses/Orbits: Bilateral cataract resection.  No acute finding. IMPRESSION: 1. No acute finding, including infarct. 2. Chronic small vessel ischemia in the cerebral white matter and pons without progression since 2017.  Electronically Signed   By: Monte Fantasia M.D.   On: 01/09/2018 10:32     Nuala Alpha, DO 01/09/2018, 1:43 PM PGY-1, [place] [facility] pager: [phone], text pages welcome  UPPER LEVEL ADDENDUM  I have read the above note and made revisions highlighted in blue.  Kerrin Mo, MD, PGY-3 [facility]

## 2018-01-09 NOTE — Progress Notes (Signed)
Pt admitted to the unit; telemetry applied and verified with CCMD; NT called to second verify; pt oriented to the unit and room; fall/safety precaution and prevention education completed. Will closely monitor pt. Delia Heady RN

## 2018-01-09 NOTE — ED Notes (Signed)
Admit MD at bedside

## 2018-01-09 NOTE — ED Notes (Signed)
Patient transported to MRI 

## 2018-01-09 NOTE — ED Triage Notes (Signed)
PT still in MRI Dept.

## 2018-01-09 NOTE — ED Notes (Signed)
Neuro MD at bedside

## 2018-01-10 DIAGNOSIS — F410 Panic disorder [episodic paroxysmal anxiety] without agoraphobia: Secondary | ICD-10-CM | POA: Diagnosis not present

## 2018-01-10 DIAGNOSIS — I10 Essential (primary) hypertension: Secondary | ICD-10-CM | POA: Diagnosis not present

## 2018-01-10 DIAGNOSIS — E785 Hyperlipidemia, unspecified: Secondary | ICD-10-CM

## 2018-01-10 DIAGNOSIS — J449 Chronic obstructive pulmonary disease, unspecified: Secondary | ICD-10-CM | POA: Diagnosis not present

## 2018-01-10 DIAGNOSIS — F329 Major depressive disorder, single episode, unspecified: Secondary | ICD-10-CM

## 2018-01-10 LAB — CBC
HCT: 38.6 % (ref 36.0–46.0)
Hemoglobin: 12.1 g/dL (ref 12.0–15.0)
MCH: 26.1 pg (ref 26.0–34.0)
MCHC: 31.3 g/dL (ref 30.0–36.0)
MCV: 83.4 fL (ref 78.0–100.0)
Platelets: 218 10*3/uL (ref 150–400)
RBC: 4.63 MIL/uL (ref 3.87–5.11)
RDW: 14.4 % (ref 11.5–15.5)
WBC: 7 10*3/uL (ref 4.0–10.5)

## 2018-01-10 LAB — BASIC METABOLIC PANEL
Anion gap: 11 (ref 5–15)
BUN: 22 mg/dL — ABNORMAL HIGH (ref 6–20)
CO2: 27 mmol/L (ref 22–32)
Calcium: 8.7 mg/dL — ABNORMAL LOW (ref 8.9–10.3)
Chloride: 102 mmol/L (ref 101–111)
Creatinine, Ser: 1 mg/dL (ref 0.44–1.00)
GFR calc Af Amer: >60 mL/min (ref >60)
GFR calc non Af Amer: 56 mL/min — ABNORMAL LOW (ref >60)
Glucose, Bld: 105 mg/dL — ABNORMAL HIGH (ref 65–99)
Potassium: 3.5 mmol/L (ref 3.5–5.1)
Sodium: 140 mmol/L (ref 135–145)

## 2018-01-10 MED ORDER — ASPIRIN EC 325 MG PO TBEC
325.0000 mg | DELAYED_RELEASE_TABLET | Freq: Every morning | ORAL | Status: DC
  Administered 2018-01-10: 325 mg via ORAL
  Filled 2018-01-10: qty 1

## 2018-01-10 MED ORDER — QUETIAPINE FUMARATE 50 MG PO TABS: 50.0000 mg | ORAL_TABLET | Freq: Two times a day (BID) | ORAL | 0 refills | Status: DC

## 2018-01-10 MED ORDER — QUETIAPINE FUMARATE 50 MG PO TABS
50.0000 mg | ORAL_TABLET | Freq: Two times a day (BID) | ORAL | Status: DC
  Administered 2018-01-10: 50 mg via ORAL
  Filled 2018-01-10: qty 1

## 2018-01-10 MED ORDER — AMOXICILLIN-POT CLAVULANATE 875-125 MG PO TABS: 1.0000 | ORAL_TABLET | Freq: Two times a day (BID) | ORAL | 0 refills | Status: AC

## 2018-01-10 MED ORDER — ASPIRIN EC 81 MG PO TBEC: 81.0000 mg | DELAYED_RELEASE_TABLET | Freq: Every morning | ORAL | Status: DC

## 2018-01-10 NOTE — Evaluation (Signed)
Physical Therapy Evaluation Patient Details Name: Erica Morris MRN: [id number] DOB: [birth year] Today's Date: 01/10/2018   History of Present Illness  Erica Morris a 72 y.o.femalepresenting with right sided weakness upon waking up this morning. PMH is significant forhypertension, hyperlipidemia, stroke, GERD, hypothyroidism,dCHF,COPD, Depression, and panice attacks. MRI -negative for acute findings.  Clinical Impression  Pt functioning near baseline. Pt's R sided weakness seams to have resolved. Pt c/o anxiety re: difficulty breathing and panic attacks. Pt would like to live with one of her daughters but reports she'll be going home with her son and his wife who can provide 24/7 assist majority of time. PT to continue to follow while in hospital.    Follow Up Recommendations No PT follow up;Supervision/Assistance - 24 hour    Equipment Recommendations  None recommended by PT    Recommendations for Other Services       Precautions / Restrictions Precautions Precautions: Fall Restrictions Weight Bearing Restrictions: No      Mobility  Bed Mobility Overal bed mobility: Independent             General bed mobility comments: no use of bed rails, HOB elevated  Transfers Overall transfer level: Needs assistance Equipment used: None Transfers: Sit to/from Stand Sit to Stand: Min guard         General transfer comment: pt reached for PTs hand due to "shakiness" however no reall physical assist provided  Ambulation/Gait Ambulation/Gait assistance: Min guard Ambulation Distance (Feet): 250 Feet Assistive device: 1 person hand held assist Gait Pattern/deviations: Step-through pattern Gait velocity: wfl Gait velocity interpretation: at or above normal speed for age/gender General Gait Details: pt with no overt LOB, pt preferred to hold on to PTs hand due to "shakiness" however steady. Pt with + SOB, SpO2  >94% on RA. pt c/o anxiety regarding not being able to  breath  Stairs            Wheelchair Mobility    Modified Rankin (Stroke Patients Only) Modified Rankin (Stroke Patients Only) Pre-Morbid Rankin Score: No significant disability Modified Rankin: No significant disability     Balance Overall balance assessment: Modified Independent                                           Pertinent Vitals/Pain Pain Assessment: 0-10 Pain Score: 9  Pain Location: R frontal headache Pain Descriptors / Indicators: Headache Pain Intervention(s): Patient requesting pain meds-RN notified    Home Living Family/patient expects to be discharged to:: Private residence Living Arrangements: Children;Other relatives Available Help at Discharge: Family;Available 24 hours/day(majority of time) Type of Home: Apartment Home Access: Ramped entrance     Home Layout: One level        Prior Function Level of Independence: Independent         Comments: pt doesnt drive but able to ambulate without AD and does all ADLs indep including cooking and cleaning     Hand Dominance   Dominant Hand: Right    Extremity/Trunk Assessment   Upper Extremity Assessment Upper Extremity Assessment: Overall WFL for tasks assessed    Lower Extremity Assessment Lower Extremity Assessment: Overall WFL for tasks assessed(mild R sided weakness but not affecting function)    Cervical / Trunk Assessment Cervical / Trunk Assessment: Normal  Communication   Communication: No difficulties  Cognition Arousal/Alertness: Awake/alert Behavior During Therapy: Anxious Overall Cognitive Status: No  family/caregiver present to determine baseline cognitive functioning                                 General Comments: pt oriented and aware of situation. Pt does have history of anxiety and panic attacks.      General Comments      Exercises     Assessment/Plan    PT Assessment Patient needs continued PT services  PT Problem List  Decreased strength;Decreased activity tolerance;Decreased balance;Decreased mobility;Decreased coordination;Decreased cognition;Decreased knowledge of use of DME;Decreased safety awareness       PT Treatment Interventions DME instruction;Gait training;Stair training;Functional mobility training;Therapeutic activities;Therapeutic exercise;Balance training;Neuromuscular re-education    PT Goals (Current goals can be found in the Care Plan section)  Acute Rehab PT Goals Patient Stated Goal: improved breathing PT Goal Formulation: With patient Time For Goal Achievement: 01/24/18 Potential to Achieve Goals: Good Additional Goals Additional Goal #1: Pt to score >19 on DGI to indicate minimal falls risk.    Frequency Min 3X/week   Barriers to discharge Decreased caregiver support pt with inconsistent support at home    Co-evaluation               AM-PAC PT "6 Clicks" Daily Activity  Outcome Measure Difficulty turning over in bed (including adjusting bedclothes, sheets and blankets)?: None Difficulty moving from lying on back to sitting on the side of the bed? : None Difficulty sitting down on and standing up from a chair with arms (e.g., wheelchair, bedside commode, etc,.)?: A Little Help needed moving to and from a bed to chair (including a wheelchair)?: A Little Help needed walking in hospital room?: A Little Help needed climbing 3-5 steps with a railing? : A Little 6 Click Score: 20    End of Session Equipment Utilized During Treatment: Gait belt Activity Tolerance: (limited by anxiety) Patient left: in bed;with bed alarm set;with nursing/sitter in room Nurse Communication: Mobility status;Patient requests pain meds(request for anxiety meds) PT Visit Diagnosis: Unsteadiness on feet (R26.81)    Time: 9449-6759 PT Time Calculation (min) (ACUTE ONLY): 21 min   Charges:   PT Evaluation $PT Eval Moderate Complexity: 1 Mod     PT G Codes:        Kittie Plater, PT,  DPT Pager #: [phone] Office #: [phone]   [place] 01/10/2018, 8:51 AM

## 2018-01-10 NOTE — Progress Notes (Signed)
Family Medicine Teaching Service Daily Progress Note Intern Pager: [phone]  Patient name: Erica Morris Medical record number: [id number] Date of birth: September 18, 1946 Age: 72 y.o. Gender: female  Primary Care Provider: Zenia Resides, MD Consultants: Neuro Code Status: full  Pt Overview and Major Events to Date:  2/10: Admitted  Assessment and Plan: Erica Morris is a 72 y.o. female presenting with right sided weakness, thought to be a panic attack. PMH is significant for HTN, hyperlipidemia, stroke, GERD, hypothyroidism, HFpEF, COPD, and anxiety/depression.  Panic Attack: Patient p/w acute onset of R-sided weakness in setting of CT and MRI without acute changes, initially thought to be TIA. Neuro workup unremarkable with nl TSH, HgbA1c, trop, lipids, MRI, and CTA head/neck. On further questioning, pt endorses shakiness and increased anxiety since change in her SSRI from prozac to effexor.  - Cont home venlafaxine 75mg  daily - Cont home Quetiapine 25mg  BID - ASA 81 mg, d/c plavix - Atorvastatin 40mg  qd - outpatient followup with PCP, consider psychiatry referral - neuro following, appreciate reccs - PT consult, OT consult, Speech consult  COPD Exacerbation: Pt w increased episodes of coughing/shortness of breath x 3 weeks in setting of nl CXR and mildly elevated BNP w/o evidence of fluid overload on exam. She was started on prednisone and doxy 2/7 for suspected COPD exacerbation; will continue to treat as COPD exacerbation.  - Duonebs Q4H prn - Cont home fluticasone daily, loratadine 10mg   - Cont Prednisone 50mg  (2/7-) x5d stop on 2/11 - Continue Augmentin BID (2/10 - ) x 5d to cover for Sinusitis; s/p doxy 2/7-2/10 - Cont Tessalon pearls - d/c oseltamivir given flu negative  Acute Rhinosinusitis: Pt c/o worsening nasal congestion, sinus pain, and purulent rinorrhea x 3wks with tenderness over frontal sinuses.  - Augmentin 875mg -125mg  started for suspected acute  sinusitis  HTN: normotensive overnight, initially hypertensive on admission with systolics 314H-702O.  - Cont home Lasix 20mg  daily, metoprolol 25mg  BID  GERD: Well controlled on hom Protonix 40mg  - Cont Protonix 40mg  daily  HLD: LDL on admission 89.  - Cont home Atorvastatin 40mg   Hypothyroidism: On home Levothyroxine, TSH wnl.  - Cont home Levothyroxine 43mcg  FEN/GI: Protonix/Reg diet Prophylaxis: Lovenox  Disposition: home today  Subjective:  Patient reports being highly anxious and "shaky" this morning. She reports being able to get up and walk around well. She endorses ongoing congestion and difficulty breathing. She reports shortness of breath when lying flat.   Objective: Temp:  [97.7 F (36.5 C)-98.3 F (36.8 C)] 97.7 F (36.5 C) (02/11 0954) Pulse Rate:  [62-72] 64 (02/11 0954) Resp:  [16-18] 18 (02/11 0954) BP: (115-175)/(75-98) 175/83 (02/11 0954) SpO2:  [96 %-98 %] 96 % (02/11 0954)   Physical Exam: General: alert, oriented, NAD Cardiovascular: RRR, no m/r/g, no JVD Respiratory: Normal WOB on RA, good air entry and respiratory effort. Scattered expiratory wheeze heard occasionally Abdomen: Normoactive bowel sounds, soft, nontender, nondistended Extremities: WWP, 2+ pedal pulses, no edema.  Neuro: alert and awake, oriented. Grossly normal  Laboratory: Recent Labs  Lab 01/07/18 1844 01/09/18 0847 01/10/18 0253  WBC 9.0 8.9 7.0  HGB 12.6 12.2 12.1  HCT 38.2 38.4 38.6  PLT 250 238 218   Recent Labs  Lab 01/07/18 1844 01/09/18 0847 01/10/18 0253  NA 138 139 140  K 2.8* 2.9* 3.5  CL 101 101 102  CO2 23 24 27   BUN 20 21* 22*  CREATININE 0.97 0.97 1.00  CALCIUM 9.0 8.8* 8.7*  PROT  --  6.0*  --   BILITOT  --  0.6  --   ALKPHOS  --  85  --   ALT  --  10*  --   AST  --  21  --   GLUCOSE 106* 103* 105*    Imaging/Diagnostic Tests: CT A Head/Neck IMPRESSION: 1. Normal variant circle-of-Willis without significant proximal stenosis,  aneurysm, or branch vessel occlusion. 2. Minimal atherosclerotic change at the left carotid bifurcation without significant stenosis. 3. Normal right carotid artery and bilateral vertebral arteries. 4. Atherosclerotic changes within the descending thoracic aorta. Aortic Atherosclerosis (ICD10-I70.0). 5. Mild degenerative changes within the cervical spine.  MRI Brain wo Contrast IMPRESSION: 1. No acute finding, including infarct. 2. Chronic small vessel ischemia in the cerebral white matter and pons without progression since 2017.  Dimple Casey, Medical Student 01/10/2018, 12:05 PM MS4, [place] Intern pager: [phone], text pages welcome  FPTS Upper-Level Resident Addendum  I have independently interviewed and examined the patient. I have discussed the above with the original author and agree with their documentation.  Please see also any attending notes.   Bufford Lope, DO PGY-2, [facility] 01/10/2018 3:12 PM  FPTS Service pager: [phone] (text pages welcome through [facility])

## 2018-01-10 NOTE — Progress Notes (Signed)
STROKE TEAM PROGRESS NOTE   SUBJECTIVE (INTERVAL HISTORY) No family is at the bedside.  Overall she feels her condition is rapidly improving. She denies any weakness or numbness and she stated that she has difficulty with breathing and she knows that is due to his anxiety attack. She can not identify her stressors but told me that her prozac has been discontinued by her PCP and she felt that is the cause for her anxiety.    OBJECTIVE Temp:  [97.7 F (36.5 C)-98.3 F (36.8 C)] 97.7 F (36.5 C) (02/11 0954) Pulse Rate:  [62-72] 64 (02/11 0954) Cardiac Rhythm: Normal sinus rhythm (02/11 0830) Resp:  [16-18] 18 (02/11 0954) BP: (115-175)/(75-98) 175/83 (02/11 0954) SpO2:  [96 %-98 %] 96 % (02/11 0954)  No results for input(s): GLUCAP in the last 168 hours. Recent Labs  Lab 01/07/18 1844 01/09/18 0847 01/10/18 0253  NA 138 139 140  K 2.8* 2.9* 3.5  CL 101 101 102  CO2 23 24 27   GLUCOSE 106* 103* 105*  BUN 20 21* 22*  CREATININE 0.97 0.97 1.00  CALCIUM 9.0 8.8* 8.7*   Recent Labs  Lab 01/09/18 0847  AST 21  ALT 10*  ALKPHOS 85  BILITOT 0.6  PROT 6.0*  ALBUMIN 3.5   Recent Labs  Lab 01/07/18 1844 01/09/18 0847 01/10/18 0253  WBC 9.0 8.9 7.0  NEUTROABS  --  6.0  --   HGB 12.6 12.2 12.1  HCT 38.2 38.4 38.6  MCV 80.4 82.8 83.4  PLT 250 238 218   No results for input(s): CKTOTAL, CKMB, CKMBINDEX, TROPONINI in the last 168 hours. Recent Labs    01/09/18 0847  LABPROT 13.8  INR 1.07   Recent Labs    01/09/18 0847  COLORURINE AMBER*  LABSPEC 1.030  PHURINE 5.0  GLUCOSEU NEGATIVE  HGBUR NEGATIVE  BILIRUBINUR NEGATIVE  KETONESUR NEGATIVE  PROTEINUR 30*  NITRITE NEGATIVE  LEUKOCYTESUR LARGE*       Component Value Date/Time   CHOL 161 01/09/2018 0846   TRIG 176 (H) 01/09/2018 0846   HDL 37 (L) 01/09/2018 0846   CHOLHDL 4.4 01/09/2018 0846   VLDL 35 01/09/2018 0846   LDLCALC 89 01/09/2018 0846   Lab Results  Component Value Date   HGBA1C 5.7 (H)  01/09/2018      Component Value Date/Time   LABOPIA NONE DETECTED 01/09/2018 0847   COCAINSCRNUR NONE DETECTED 01/09/2018 0847   LABBENZ NONE DETECTED 01/09/2018 0847   AMPHETMU NONE DETECTED 01/09/2018 0847   THCU NONE DETECTED 01/09/2018 0847   LABBARB NONE DETECTED 01/09/2018 0847    Recent Labs  Lab 01/09/18 0847  ETH <10    I have personally reviewed the radiological images below and agree with the radiology interpretations.  Ct Angio Head W Or Wo Contrast  Result Date: 01/09/2018 CLINICAL DATA:  Woke up with headache and dizziness today. EXAM: CT ANGIOGRAPHY HEAD AND NECK TECHNIQUE: Multidetector CT imaging of the head and neck was performed using the standard protocol during bolus administration of intravenous contrast. Multiplanar CT image reconstructions and MIPs were obtained to evaluate the vascular anatomy. Carotid stenosis measurements (when applicable) are obtained utilizing NASCET criteria, using the distal internal carotid diameter as the denominator. CONTRAST:  24mL ISOVUE-370 IOPAMIDOL (ISOVUE-370) INJECTION 76% COMPARISON:  CT head without contrast 01/09/2018. MRI brain 01/09/2018. FINDINGS: CTA NECK FINDINGS Aortic arch: A 3 vessel arch configuration is present. Atherosclerotic calcifications are present in the descending aorta. There is no significant stenosis of the great vessel  origins. There is no aneurysm. Right carotid system: The right common carotid artery is within normal limits. Bifurcation is unremarkable. Cervical right ICA is normal. Left carotid system: The left common carotid artery is within normal limits. Minimal atherosclerotic changes present at the bifurcation. The cervical left ICA is within normal limits. Vertebral arteries: The vertebral arteries are codominant. Both vertebral arteries originate from the subclavian artery is without a significant stenosis. There is no significant stenosis throughout the cervical vertebral artery is. Skeleton: Mild  endplate degenerative changes are present from C4-5 through C6-7. Vertebral body heights and alignment are maintained. The patient is edentulous. No focal lytic or blastic lesions are present. Other neck: The soft tissues of the neck are otherwise unremarkable. Thyroid is within normal limits. Salivary glands are unremarkable. There is no significant adenopathy. Upper chest: The lung apices are clear. No focal nodule, mass, or airspace disease is present. The thoracic inlet is within normal limits. Review of the MIP images confirms the above findings CTA HEAD FINDINGS Anterior circulation: The internal carotid arteries are within normal limits from the high cervical segments through the ICA termini. The A1 and M1 segments are normal. The anterior communicating artery is patent. ACA and MCA branch vessels are within normal limits bilaterally. Posterior circulation: The vertebral arteries are codominant. PICA origins are visualized and normal. The basilar artery is normal. Both posterior cerebral arteries originate from the basilar tip. The PCA branch vessels are within normal limits. Venous sinuses: The dural sinuses are patent. The right transverse sinus is dominant. The straight sinus deep cerebral veins are intact. Cortical veins are unremarkable. Anatomic variants: None Delayed phase: Not performed. Review of the MIP images confirms the above findings IMPRESSION: 1. Normal variant circle-of-Willis without significant proximal stenosis, aneurysm, or branch vessel occlusion. 2. Minimal atherosclerotic change at the left carotid bifurcation without significant stenosis. 3. Normal right carotid artery and bilateral vertebral arteries. 4. Atherosclerotic changes within the descending thoracic aorta. Aortic Atherosclerosis (ICD10-I70.0). 5. Mild degenerative changes within the cervical spine. Electronically Signed   By: San Morelle M.D.   On: 01/09/2018 14:05   Dg Chest 2 View  Result Date:  01/09/2018 CLINICAL DATA:  Acute chest pain today.  Initial encounter. EXAM: CHEST  2 VIEW COMPARISON:  01/07/2018 and prior radiographs FINDINGS: Upper limits normal heart size noted. There is no evidence of focal airspace disease, pulmonary edema, suspicious pulmonary nodule/mass, pleural effusion, or pneumothorax. No acute bony abnormalities are identified. IMPRESSION: No active cardiopulmonary disease. Electronically Signed   By: Margarette Canada M.D.   On: 01/09/2018 08:42   Dg Chest 2 View  Result Date: 01/07/2018 CLINICAL DATA:  Chest and back pain. EXAM: CHEST  2 VIEW COMPARISON:  November 18, 2017 FINDINGS: Mild atelectasis in the left base. The heart, hila, mediastinum, lungs, and pleura are otherwise normal. IMPRESSION: No active cardiopulmonary disease. Electronically Signed   By: Dorise Bullion III M.D   On: 01/07/2018 19:10   Ct Head Wo Contrast  Result Date: 01/09/2018 CLINICAL DATA:  Woke up this morning with headache and dizziness. EXAM: CT HEAD WITHOUT CONTRAST TECHNIQUE: Contiguous axial images were obtained from the base of the skull through the vertex without intravenous contrast. COMPARISON:  11/18/2017 FINDINGS: Brain: No evidence of acute infarction, hemorrhage, hydrocephalus, extra-axial collection or mass lesion/mass effect. Chronic asymmetric widening of the occipital parietal sulci and sylvian fissures that is nonspecific. No associated hydrocephalus. Chronic small vessel ischemia in the cerebral white matter. Vascular: Atherosclerotic calcification.  No hyperdense vessel.  Skull: Negative Sinuses/Orbits: Bilateral cataract resection. No pathologic finding. IMPRESSION: 1. No acute finding.  Stable compared to 2018. 2. Mild chronic small vessel ischemia. Electronically Signed   By: Monte Fantasia M.D.   On: 01/09/2018 08:36   Ct Angio Neck W Or Wo Contrast  Result Date: 01/09/2018 CLINICAL DATA:  Woke up with headache and dizziness today. EXAM: CT ANGIOGRAPHY HEAD AND NECK  TECHNIQUE: Multidetector CT imaging of the head and neck was performed using the standard protocol during bolus administration of intravenous contrast. Multiplanar CT image reconstructions and MIPs were obtained to evaluate the vascular anatomy. Carotid stenosis measurements (when applicable) are obtained utilizing NASCET criteria, using the distal internal carotid diameter as the denominator. CONTRAST:  40mL ISOVUE-370 IOPAMIDOL (ISOVUE-370) INJECTION 76% COMPARISON:  CT head without contrast 01/09/2018. MRI brain 01/09/2018. FINDINGS: CTA NECK FINDINGS Aortic arch: A 3 vessel arch configuration is present. Atherosclerotic calcifications are present in the descending aorta. There is no significant stenosis of the great vessel origins. There is no aneurysm. Right carotid system: The right common carotid artery is within normal limits. Bifurcation is unremarkable. Cervical right ICA is normal. Left carotid system: The left common carotid artery is within normal limits. Minimal atherosclerotic changes present at the bifurcation. The cervical left ICA is within normal limits. Vertebral arteries: The vertebral arteries are codominant. Both vertebral arteries originate from the subclavian artery is without a significant stenosis. There is no significant stenosis throughout the cervical vertebral artery is. Skeleton: Mild endplate degenerative changes are present from C4-5 through C6-7. Vertebral body heights and alignment are maintained. The patient is edentulous. No focal lytic or blastic lesions are present. Other neck: The soft tissues of the neck are otherwise unremarkable. Thyroid is within normal limits. Salivary glands are unremarkable. There is no significant adenopathy. Upper chest: The lung apices are clear. No focal nodule, mass, or airspace disease is present. The thoracic inlet is within normal limits. Review of the MIP images confirms the above findings CTA HEAD FINDINGS Anterior circulation: The internal  carotid arteries are within normal limits from the high cervical segments through the ICA termini. The A1 and M1 segments are normal. The anterior communicating artery is patent. ACA and MCA branch vessels are within normal limits bilaterally. Posterior circulation: The vertebral arteries are codominant. PICA origins are visualized and normal. The basilar artery is normal. Both posterior cerebral arteries originate from the basilar tip. The PCA branch vessels are within normal limits. Venous sinuses: The dural sinuses are patent. The right transverse sinus is dominant. The straight sinus deep cerebral veins are intact. Cortical veins are unremarkable. Anatomic variants: None Delayed phase: Not performed. Review of the MIP images confirms the above findings IMPRESSION: 1. Normal variant circle-of-Willis without significant proximal stenosis, aneurysm, or branch vessel occlusion. 2. Minimal atherosclerotic change at the left carotid bifurcation without significant stenosis. 3. Normal right carotid artery and bilateral vertebral arteries. 4. Atherosclerotic changes within the descending thoracic aorta. Aortic Atherosclerosis (ICD10-I70.0). 5. Mild degenerative changes within the cervical spine. Electronically Signed   By: San Morelle M.D.   On: 01/09/2018 14:05   Mr Brain Wo Contrast  Result Date: 01/09/2018 CLINICAL DATA:  Right facial tingling. Numbness and right arm weakness this morning. EXAM: MRI HEAD WITHOUT CONTRAST TECHNIQUE: Multiplanar, multiecho pulse sequences of the brain and surrounding structures were obtained without intravenous contrast. COMPARISON:  Head CT from earlier today.  Brain MRI 03/23/2016 FINDINGS: Brain: No acute infarction, hemorrhage, hydrocephalus, extra-axial collection or mass lesion. Indistinct T2 hyperintensity  across the brainstem and patchy in the bilateral cerebral white matter most consistent with chronic small vessel ischemia given demographics and medical history.  Age normal brain volume. There is nonspecific asymmetric widening of sylvian fissures and right occipital sulci, nonspecific. No finding seen at the level of the cisterns or [place]. Vascular: Major flow voids are preserved. Skull and upper cervical spine: No evidence of marrow lesion. Sinuses/Orbits: Bilateral cataract resection.  No acute finding. IMPRESSION: 1. No acute finding, including infarct. 2. Chronic small vessel ischemia in the cerebral white matter and pons without progression since 2017. Electronically Signed   By: Monte Fantasia M.D.   On: 01/09/2018 10:32   TTE 09/2017 - Left ventricle: The cavity size was normal. Wall thickness was   increased in a pattern of mild LVH. Systolic function was normal.   The estimated ejection fraction was in the range of 60% to 65%. - Mitral valve: There was mild regurgitation. - Pulmonary arteries: PA peak pressure: 34 mm Hg (S).   PHYSICAL EXAM  Temp:  [97.7 F (36.5 C)-98.3 F (36.8 C)] 97.7 F (36.5 C) (02/11 0954) Pulse Rate:  [62-72] 64 (02/11 0954) Resp:  [16-18] 18 (02/11 0954) BP: (115-175)/(75-98) 175/83 (02/11 0954) SpO2:  [96 %-98 %] 96 % (02/11 0954)  General - Well nourished, well developed, in no apparent distress, mild restlessness.  Ophthalmologic - fundi not visualized due to noncooperation.  Cardiovascular - Regular rate and rhythm with no murmur.  Mental Status -  Level of arousal and orientation to time, place, and person were intact. Language including expression, naming, repetition, comprehension was assessed and found intact. Fund of Knowledge was assessed and was intact.  Cranial Nerves II - XII - II - Visual field intact OU. III, IV, VI - Extraocular movements intact. V - Facial sensation intact bilaterally. VII - Facial movement intact bilaterally. VIII - Hearing & vestibular intact bilaterally. X - Palate elevates symmetrically. XI - Chin turning & shoulder shrug intact bilaterally. XII -  Tongue protrusion intact.  Motor Strength - The patient's strength was normal in all extremities and pronator drift was absent.  Bulk was normal and fasciculations were absent.   Motor Tone - Muscle tone was assessed at the neck and appendages and was normal.  Reflexes - The patient's reflexes were symmetrical in all extremities and she had no pathological reflexes.  Sensory - Light touch, temperature/pinprick were assessed and were symmetrical.    Coordination - The patient had normal movements in the hands with no ataxia or dysmetria.  Tremor was absent.  Gait and Station - deferred.   ASSESSMENT/PLAN [name] is a 72 y.o. female with history of HTN, HLD, depression, COPD admitted for subjective right sided numbness, weakness, right facial droop and slurry speech. No tPA given due to out of window.    Panic attack, recurrent - pt educated on relaxation and follow up with psychology and psychiatry  Resultant neuro intact  MRI  No acute infarct  CTA head and neck - unremarkable  2D Echo  09/2017 EF 60-65%  LDL 89  HgbA1c 5.7  lovenox for VTE prophylaxis  Fall precautions  Diet regular Room service appropriate? Yes; Fluid consistency: Thin   aspirin 81 mg daily prior to admission, now on aspirin 81 mg daily.   Patient counseled to be compliant with her antithrombotic medications  Ongoing aggressive stroke risk factor management  Therapy recommendations:  pending  Disposition:  pending  Hypertension Stable  Long term BP  goal normotensive  Hyperlipidemia  Home meds:  lipitor   LDL 89, goal < 70  Now on lipitor 40  Continue statin at discharge  Other Stroke Risk Factors  Advanced age  Other Active Problems  Depression - was on prozac but discontinued, now on effexor - need continued psychiatry and psychology follow up   Hospital day # 0  Neurology will sign off. Please call with questions. No neuro follow up needed at this time. Thanks  for the consult.   Rosalin Hawking, MD PhD Stroke Neurology 01/10/2018 1:07 PM    To contact Stroke Continuity provider, please refer to [email or web address] After hours, contact General Neurology

## 2018-01-10 NOTE — Evaluation (Signed)
Occupational Therapy Evaluation Patient Details Name: Erica Morris MRN: [id number] DOB: [birth year] Today's Date: 01/10/2018    History of Present Illness Erica Morris a 72 y.o.femalepresenting with right sided weakness upon waking up this morning. PMH is significant forhypertension, hyperlipidemia, stroke, GERD, hypothyroidism,dCHF,COPD, Depression, and panice attacks. MRI -negative for acute findings.   Clinical Impression   PTA, pt was living with her son and daughter-in-law and was independent with ADLs and light IADLs. Currently, pt requires supervision for ADLs and functional mobility. Pt with right hand weakness during testing, but inconsistent with functional performance during grooming and bathing. Feel pt performing near baseline function for ADLs, balance, and cognition. Answered all pt questions. Recommend dc home once medically stable per physician. All acute OT needs met and will sign off. Thank you.     Follow Up Recommendations  No OT follow up;Supervision - Intermittent    Equipment Recommendations  None recommended by OT    Recommendations for Other Services PT consult     Precautions / Restrictions Precautions Precautions: Fall Restrictions Weight Bearing Restrictions: No      Mobility Bed Mobility Overal bed mobility: Independent             General bed mobility comments: no use of bed rails, HOB elevated  Transfers Overall transfer level: Needs assistance Equipment used: None Transfers: Sit to/from Stand Sit to Stand: Min guard         General transfer comment: Min Guard for safety    Balance Overall balance assessment: Modified Independent                                         ADL either performed or assessed with clinical judgement   ADL Overall ADL's : Needs assistance/impaired                                       General ADL Comments: Pt performing at supervision level and  demosntrating near baseline funciton. Pt performing sponge bath at sink without LOB or difficulty. Applying deotorant without any noted grasp weakness in RUE.      Vision Baseline Vision/History: Wears glasses Wears Glasses: At all times Patient Visual Report: Other (comment)("I'm seeing spots but I need new glasses" Glasses not in room)       Perception     Praxis      Pertinent Vitals/Pain Pain Assessment: No/denies pain Pain Score: 9  Pain Location: R frontal headache Pain Descriptors / Indicators: Headache Pain Intervention(s): Monitored during session     Hand Dominance Right   Extremity/Trunk Assessment Upper Extremity Assessment Upper Extremity Assessment: RUE deficits/detail RUE Deficits / Details: Noted R-side weakness, but inconsistant with functional performance during grooming and bathing   Lower Extremity Assessment Lower Extremity Assessment: Overall WFL for tasks assessed   Cervical / Trunk Assessment Cervical / Trunk Assessment: Normal   Communication Communication Communication: No difficulties   Cognition Arousal/Alertness: Awake/alert Behavior During Therapy: Anxious Overall Cognitive Status: No family/caregiver present to determine baseline cognitive functioning                                 General Comments: pt oriented and aware of situation. Pt does have history of anxiety and panic attacks. Able to  problem solve to perform sponge bath at sink and order lunch using room phone and menu.   General Comments  Pt reportign that she is very axious this morning and her hands are shaking    Exercises     Shoulder Instructions      Home Living Family/patient expects to be discharged to:: Private residence Living Arrangements: Children;Other relatives Available Help at Discharge: Family;Available 24 hours/day(majority of time) Type of Home: Apartment Home Access: Ramped entrance     Home Layout: One level     Bathroom  Shower/Tub: Occupational psychologist: Standard                Prior Functioning/Environment Level of Independence: Independent        Comments: pt doesnt drive but able to ambulate without AD and does all ADLs indep including cooking and cleaning        OT Problem List: Decreased activity tolerance;Decreased cognition;Decreased knowledge of use of DME or AE      OT Treatment/Interventions:      OT Goals(Current goals can be found in the care plan section) Acute Rehab OT Goals Patient Stated Goal: Stop feeling anxious OT Goal Formulation: All assessment and education complete, DC therapy  OT Frequency:     Barriers to D/C:            Co-evaluation              AM-PAC PT "6 Clicks" Daily Activity     Outcome Measure Help from another person eating meals?: None Help from another person taking care of personal grooming?: None Help from another person toileting, which includes using toliet, bedpan, or urinal?: None Help from another person bathing (including washing, rinsing, drying)?: A Little Help from another person to put on and taking off regular upper body clothing?: None Help from another person to put on and taking off regular lower body clothing?: A Little 6 Click Score: 22   End of Session Equipment Utilized During Treatment: Gait belt Nurse Communication: Mobility status  Activity Tolerance: Patient tolerated treatment well Patient left: in bed;with call bell/phone within reach;with bed alarm set  OT Visit Diagnosis: Muscle weakness (generalized) (M62.81);Other symptoms and signs involving cognitive function                Time: 0750-0809 OT Time Calculation (min): 19 min Charges:  OT General Charges $OT Visit: 1 Visit OT Evaluation $OT Eval Moderate Complexity: 1 Mod G-Codes:     Terry MSOT, OTR/L Acute Rehab Pager: [phone] Office: [place] 01/10/2018, 9:10 AM

## 2018-01-10 NOTE — Progress Notes (Signed)
Initial Nutrition Assessment  DOCUMENTATION CODES:   Not applicable  INTERVENTION:  Ensure Enlive po BID, each supplement provides 350 kcal and 20 grams of protein  NUTRITION DIAGNOSIS:   Increased nutrient needs related to acute illness as evidenced by estimated needs.  GOAL:   Patient will meet greater than or equal to 90% of their needs  MONITOR:   PO intake, I & O's, Labs, Supplement acceptance, Weight trends  REASON FOR ASSESSMENT:   Malnutrition Screening Tool    ASSESSMENT:   Erica Morris is a 72 yo female with PMH of hypertension, hyperlipidemia, stroke, GERD, hypothyroidism, dCHF, COPD, Depression, presents with R-sided weakness and SOB  Spoke with patient at bedside. She states she has been eating 2 times per day, mostly eating out. She was non-specific with where she likes to go eat but states "chicken." Has been losing and gaining weight with sicknesses, but she is unsure of usual weight. Per chart she exhibits a 12 pound/6.3% weight loss over 1.25 months. Had a little bit of nausea during my visit, and asked for an ensure. Has struggled some with eating with her shortness of breath, but ate 50% of lunch during visit.  Labs reviewed:  BNP 129.6, Triglycerides 176  Medications reviewed and include:  Prednisone  NUTRITION - FOCUSED PHYSICAL EXAM:    Most Recent Value  Orbital Region  No depletion  Upper Arm Region  No depletion  Thoracic and Lumbar Region  No depletion  Buccal Region  No depletion  Temple Region  No depletion  Clavicle Bone Region  No depletion  Clavicle and Acromion Bone Region  No depletion  Scapular Bone Region  No depletion  Dorsal Hand  No depletion  Patellar Region  No depletion  Anterior Thigh Region  No depletion  Posterior Calf Region  No depletion  Edema (RD Assessment)  None  Hair  Reviewed  Eyes  Reviewed  Mouth  Reviewed  Skin  Reviewed  Nails  Reviewed       Diet Order:  Fall precautions Diet regular Room service  appropriate? Yes; Fluid consistency: Thin  EDUCATION NEEDS:   Education needs have been addressed  Skin:  Skin Assessment: Reviewed RN Assessment  Last BM:  01/06/2018  Height:   Ht Readings from Last 1 Encounters:  01/09/18 5\' 4"  (1.626 m)    Weight:   Wt Readings from Last 1 Encounters:  01/09/18 179 lb (81.2 kg)    Ideal Body Weight:  54.54 kg  BMI:  Body mass index is 30.73 kg/m.  Estimated Nutritional Needs:   Kcal:  1586-1716 calories (MSJ x1.2-1.3)  Protein:  80-100 grams  Fluid:  1.6-1.7L  Satira Anis. Ward, MS, RD LDN Inpatient Clinical Dietitian Pager [phone]

## 2018-01-10 NOTE — Discharge Instructions (Signed)
During your admission to the hospital, you were evaluated for a stroke. We did not find anything on the MRI or CT images of your brain that showed a recent stroke. We think your symptoms are most likely related to your anxiety and panic attacks.  Please follow-up with your regular doctor to discuss changing medications, therapy/counseling, or other options to help manage these symptoms. Please continue to take all of your medications as prescribed.   You were also diagnosed with an infection of your sinuses. We have prescribed an antibiotic, augmentin, to be taken twice a day for a total of 7 days. Please start taking this medicine the evening of Feb 11th and stop it on Feb 17th.

## 2018-01-10 NOTE — Care Management Obs Status (Signed)
MEDICARE OBSERVATION STATUS NOTIFICATION   Patient Details  Name: Erica Morris MRN: [id number] Date of Birth: 1946/09/10   Medicare Observation Status Notification Given:  Yes    Pollie Friar, RN 01/10/2018, 3:47 PM

## 2018-01-10 NOTE — Discharge Summary (Signed)
Erica Morris  Patient name: Erica Morris Medical record number: [id number] Date of birth: 06-28-1946 Age: 72 y.o. Gender: female Date of Admission: 01/09/2018  Date of Discharge: 01/10/2018 Admitting Physician: Leeanne Rio, MD  Primary Care Provider: Zenia Resides, MD Consultants: Neurology  Indication for Hospitalization: Rule-out CVA  Discharge Diagnoses/Problem List:  Hx CVA HTN Hypothyroidism Anxiety HFpEF COPD Acute Rhinosinusitis Hyperlipidemia   Disposition: Home  Discharge Condition: Good  Discharge Exam:  General: alert, oriented, NAD Cardiovascular: RRR, no m/r/g, no JVD Respiratory: Normal WOB on RA, good air entry and respiratory effort. Scattered expiratory wheeze heard occasionally Abdomen: Normoactive bowel sounds, soft, nontender, nondistended Extremities: WWP, 2+ pedal pulses, no edema.   Brief Hospital Course:  Erica Morris is a 72yo F with past medical history notable for HTN, HLD, HFpEF, COPD, hypothyroidism, and anxiety who presented with acute onset of R-sided weakness and facial droop in addition to 3 wks of cough and SOB for which she had started outpt treatment for a COPD exacerbation on 2/7. On admission, patient was evaluated for a CVA; CT and MRI were without acute changes, labwork was unremarkable (see below), and patient had no focal neurological findings on exam. After further discussion, patient endorsed worsening anxiety, shakiness, and a concern that she would "stop breathing"; it was felt that her symptoms were most consistent with a panic attack by neurology, so risk-stratification workup for CVA was discontinued and her dose of seroquel was increased after discussion with her PCP.  During admission, patient also endorsed purulent rinorrhea and frontal sinus pain, so she was started on augmentin for presumed acute rhinosinusitis.     Issues for Follow Up:  1. Patient endorsed significant  anxiety during admission, likely contributing to her presenting symptoms. Discussed with Dr. Andria Frames and ultimately increase seroquel from 25mg  BID to 50mg  BID.  2. Started amox-clavulanate 875-125 BID x 7 days (2/10-2/17) for presumed acute bacterial rhinosinusitis given worsening and persistent symptoms; please ensure symptoms are improving 3. Completed prednisone course for presumed COPD exacerbation x 5 days (2/7-2/11), please ensure symptoms improving. 4. Nutrition recommended Ensure supplements BID given 12lb weight loss over 5 wks; please recheck pt weight and consider adding supplements to regimen  Significant Procedures: none  Significant Labs and Imaging:  Recent Labs  Lab 01/07/18 1844 01/09/18 0847 01/10/18 0253  WBC 9.0 8.9 7.0  HGB 12.6 12.2 12.1  HCT 38.2 38.4 38.6  PLT 250 238 218   Recent Labs  Lab 01/07/18 1844 01/09/18 0847 01/10/18 0253  NA 138 139 140  K 2.8* 2.9* 3.5  CL 101 101 102  CO2 23 24 27   GLUCOSE 106* 103* 105*  BUN 20 21* 22*  CREATININE 0.97 0.97 1.00  CALCIUM 9.0 8.8* 8.7*  ALKPHOS  --  85  --   AST  --  21  --   ALT  --  10*  --   ALBUMIN  --  3.5  --     Results/Tests Pending at Time of Discharge: none  Discharge Medications:  Allergies as of 01/10/2018      Reactions   Lisinopril Cough   Codeine Phosphate Itching   Morphine And Related Itching   Burning sensation and itchiness with IV morphine      Medication List    STOP taking these medications   predniSONE 50 MG tablet Commonly known as:  DELTASONE     TAKE these medications   AEROCHAMBER PLUS inhaler Use as instructed  amoxicillin-clavulanate 875-125 MG tablet Commonly known as:  AUGMENTIN Take 1 tablet by mouth every 12 (twelve) hours for 12 doses.   aspirin EC 81 MG tablet Take 81 mg by mouth every morning.   atorvastatin 40 MG tablet Commonly known as:  LIPITOR TAKE 1 TABLET BY MOUTH  DAILY   benzonatate 200 MG capsule Commonly known as:  TESSALON Take  1 capsule (200 mg total) by mouth 3 (three) times daily as needed for cough.   fluticasone 50 MCG/ACT nasal spray Commonly known as:  FLONASE Place 1 spray into both nostrils 2 (two) times daily. What changed:    when to take this  reasons to take this   furosemide 20 MG tablet Commonly known as:  LASIX Take 1 tablet (20 mg total) by mouth daily.   guaiFENesin 100 MG/5ML liquid Commonly known as:  ROBITUSSIN Take 200 mg by mouth 4 (four) times daily as needed for cough.   levothyroxine 50 MCG tablet Commonly known as:  SYNTHROID, LEVOTHROID Take 1 tablet (50 mcg total) daily before breakfast by mouth.   loratadine 10 MG tablet Commonly known as:  CLARITIN Take 1 tablet (10 mg total) by mouth daily as needed for allergies.   metoprolol tartrate 25 MG tablet Commonly known as:  LOPRESSOR Take 1 tablet (25 mg total) by mouth 2 (two) times daily.   pantoprazole 40 MG tablet Commonly known as:  PROTONIX TAKE 1 TABLET BY MOUTH  DAILY What changed:    how much to take  how to take this  when to take this  additional instructions   polyethylene glycol packet Commonly known as:  MIRALAX / GLYCOLAX Take 17 g by mouth daily as needed. What changed:  reasons to take this   QUEtiapine 50 MG tablet Commonly known as:  SEROQUEL Take 1 tablet (50 mg total) by mouth 2 (two) times daily. What changed:    medication strength  how much to take   SYSTANE 0.4-0.3 % Soln Generic drug:  Polyethyl Glycol-Propyl Glycol Place 1 drop into both eyes daily.   venlafaxine XR 75 MG 24 hr capsule Commonly known as:  EFFEXOR XR Take 1 capsule (75 mg total) by mouth daily with breakfast.       Discharge Instructions: Please refer to Patient Instructions section of EMR for full details.  Patient was counseled important signs and symptoms that should prompt return to medical care, changes in medications, dietary instructions, activity restrictions, and follow up appointments.    Follow-Up Appointments: Follow-up Information    Hensel, Jamal Collin, MD. Go to.   Specialty:  Family Medicine Contact information: [place] [address] [place], [place], Medical Student 01/10/2018, 3:06 PM MS4, [place] Upper-Level Resident Addendum  I have independently interviewed and examined the patient. I have discussed the above with the original author and agree with their documentation. Please see also any attending notes.   Bufford Lope, DO PGY-2, [facility] 01/10/2018 4:40 PM  FPTS Service pager: [phone] (text pages welcome through [place])

## 2018-01-10 NOTE — Care Management Note (Signed)
Case Management Note  Patient Details  Name: Erica Morris MRN: [id number] Date of Birth: 11-18-1946  Subjective/Objective:    Pt in with TIA. She is from home with her son and daughter in law.               PCP: Dr Andria Frames Insurance: [facility] [facility] medicare  Action/Plan: Pt discharging home with self care. No further needs per CM.   Expected Discharge Date:  01/10/18               Expected Discharge Plan:  Home/Self Care  In-House Referral:     Discharge planning Services     Post Acute Care Choice:    Choice offered to:     DME Arranged:    DME Agency:     HH Arranged:    HH Agency:     Status of Service:  Completed, signed off  If discussed at [employer] of Stay Meetings, dates discussed:    Additional Comments:  Pollie Friar, RN 01/10/2018, 3:49 PM

## 2018-01-10 NOTE — Consult Note (Addendum)
   [facility] CM Inpatient Consult   01/10/2018  FATE GALANTI 03/26/1946 [id number]    Referral received from [place] Management office due 7 ED visits in the last 6 months.   Went to bedside to speak with Mrs. Crownover about [facility] Care Management program. She is agreeable to [place] Management follow up and written consent obtained. She states " I need all the help I can get". [facility] Care Management folder and contact information provided.   Mrs. Pitner lives with family.  Uses mail order pharmacy for medications.  Family takes her to MD appointments.  Confirmed Primary Care MD is Dr. Andria Frames.   Will make referral to [facility] due to high risk for readmission. History of  hypertension, hyperlipidemia, stroke, GERD, hypothyroidism,dCHF, COPD, Depression.   Made inpatient RNCM aware [place] Management follow post discharge.  Marthenia Rolling, MSN-Ed, RN,BSN [facility] Liaison [phone]

## 2018-01-11 ENCOUNTER — Other Ambulatory Visit: Payer: Self-pay | Admitting: *Deleted

## 2018-01-11 NOTE — Patient Outreach (Signed)
Left voicemail message for Lauren, RN with [facility] to make aware that [facility] Management will follow post hospital discharge as well. Patient has frequent utilization of the ED.   Marthenia Rolling, MSN-Ed, RN,BSN [facility] Liaison [phone]

## 2018-01-11 NOTE — Patient Outreach (Signed)
[facility]) Care Management  01/11/2018  Erica Morris [birth year] [id number]   Initial transition of care call  Admission 01/09/18 Discharged 01/10/2018 Diagnosis :  Right sided weakness, CVA ruled out, panic attack. Rhino sinusitis,   72 year old female with past medical history of Hypertension, CVA, Hyperlipidemia, COPD. Heart failure, anxiety.   Unsuccessful initial telephone outreach call to patient, able to leave a HIPAA compliant message for return call.  Plan Will schedule outreach telephone call attempt in the next day, if no return call today.  [name], RN, [place] Management Coordinator  [phone]- Mobile [phone]- Toll Free Main Office

## 2018-01-12 ENCOUNTER — Other Ambulatory Visit: Payer: Self-pay | Admitting: *Deleted

## 2018-01-12 ENCOUNTER — Encounter: Payer: Self-pay | Admitting: *Deleted

## 2018-01-12 DIAGNOSIS — F410 Panic disorder [episodic paroxysmal anxiety] without agoraphobia: Secondary | ICD-10-CM

## 2018-01-12 NOTE — Patient Outreach (Addendum)
[facility]) Care Management  01/12/2018  Erica Morris 17-May-1946 [id number]   Transition of care call attempt #2  Unsuccessful call attempt , able to leave a HIPAA compliant message for return call. Placed call to emergency contact listed Darcella Cheshire, daughter in law, HIPAA verified, states she will give patient message to return call to my provided number.  Plan Will await return call on today, if no response will send unsuccessful outreach letter and schedule next telephone outreach per workflow. 1700 No return call will send unsuccessful outreach letter.   [name], RN, [place] Management Coordinator  [phone]- Mobile [phone]- Toll Free Main Office

## 2018-01-13 ENCOUNTER — Encounter: Payer: Self-pay | Admitting: Family Medicine

## 2018-01-13 ENCOUNTER — Ambulatory Visit (INDEPENDENT_AMBULATORY_CARE_PROVIDER_SITE_OTHER): Payer: Medicare Other | Admitting: Family Medicine

## 2018-01-13 ENCOUNTER — Other Ambulatory Visit: Payer: Self-pay

## 2018-01-13 DIAGNOSIS — F339 Major depressive disorder, recurrent, unspecified: Secondary | ICD-10-CM | POA: Diagnosis not present

## 2018-01-13 DIAGNOSIS — F410 Panic disorder [episodic paroxysmal anxiety] without agoraphobia: Secondary | ICD-10-CM

## 2018-01-13 MED ORDER — CLONAZEPAM 0.5 MG PO TABS: 0.5000 mg | ORAL_TABLET | Freq: Two times a day (BID) | ORAL | 1 refills | Status: DC | PRN

## 2018-01-13 MED ORDER — FLUOXETINE HCL 40 MG PO CAPS
40.0000 mg | ORAL_CAPSULE | Freq: Every day | ORAL | 3 refills | Status: DC
Start: 2018-01-13 — End: 2018-03-22

## 2018-01-13 NOTE — Patient Instructions (Signed)
I believe your main problem is your nerves. You need to get out of bed You do not need oxygen Continue using the seroquel Stop the effexor I sent in two new prescriptions - both for your nerves: prozac and clonazepam. See me in 2-3 weeks to keep working on this problem.  I promise to keep making changes until you feel better.

## 2018-01-14 NOTE — Assessment & Plan Note (Signed)
With panic attacks and a huge anxiety componant.  Had been better controlled on prozac, which she and daughter asked me to change to effexor months ago because the prozac "quit working."  Clearly has done worse on effexor.  Will  DC effexor  Start prozac  Continue seroquel at current dose  Start clonazepam despite being a [place] drug.  Given two recent anxiety related hospitalizations and a host of ER visits, I believe the benefits outweigh the risks.

## 2018-01-14 NOTE — Progress Notes (Signed)
   Subjective:    Patient ID: Erica Morris, female    DOB: 1946-06-23, 72 y.o.   MRN: [id number]  HPI Patient recently [facility].  Had focal neuro sx.  However, TIA ruled out and thought to be a panic attack.  At DC, her buspirone was [facility] and seroquel was increased.  Not surprisingly, she states her nerves are still terrible.  Had a panic attack at a restruant.  She that she constantly has a hard time breathing.  Not yet had her PFTs. Spends much of the day in bed and then complains that she cannot sleep at night. No SI or HI.    Review of Systems     Objective:   Physical Exam Relatively calm and no dyspnea in the office. Lungs clear without wheeze.  Greater than 50% of visit was counseling.  Duration of visit was 25 minutes.        Assessment & Plan:

## 2018-01-14 NOTE — Assessment & Plan Note (Signed)
See major depression

## 2018-01-26 ENCOUNTER — Other Ambulatory Visit: Payer: Self-pay | Admitting: *Deleted

## 2018-01-26 NOTE — Patient Outreach (Signed)
[name]) Care Management  01/26/2018  Erica Morris 1946/04/05 [id number]   3rd call attempt  No response from patient  after outreach letter sent, Unsuccessful 3rd call attempt, no answer, not able to leave message.   Plan Will close case  per workflow Will send CMA case closure message, unable to establish contact with patient.    [name], RN, [place] Management Coordinator  [phone]- Mobile [phone]- Toll Free Main Office

## 2018-02-14 DIAGNOSIS — E782 Mixed hyperlipidemia: Secondary | ICD-10-CM | POA: Diagnosis not present

## 2018-02-14 DIAGNOSIS — I2510 Atherosclerotic heart disease of native coronary artery without angina pectoris: Secondary | ICD-10-CM | POA: Diagnosis not present

## 2018-02-14 DIAGNOSIS — I10 Essential (primary) hypertension: Secondary | ICD-10-CM | POA: Diagnosis not present

## 2018-02-14 DIAGNOSIS — E039 Hypothyroidism, unspecified: Secondary | ICD-10-CM | POA: Diagnosis not present

## 2018-02-16 ENCOUNTER — Ambulatory Visit: Payer: Medicare Other | Admitting: Psychology

## 2018-03-14 ENCOUNTER — Other Ambulatory Visit: Payer: Self-pay | Admitting: Family Medicine

## 2018-03-14 DIAGNOSIS — I5032 Chronic diastolic (congestive) heart failure: Secondary | ICD-10-CM

## 2018-03-18 ENCOUNTER — Ambulatory Visit (HOSPITAL_COMMUNITY): Payer: Medicare Other | Admitting: Psychiatry

## 2018-03-22 ENCOUNTER — Ambulatory Visit (INDEPENDENT_AMBULATORY_CARE_PROVIDER_SITE_OTHER): Payer: Medicare Other | Admitting: Internal Medicine

## 2018-03-22 ENCOUNTER — Other Ambulatory Visit: Payer: Self-pay

## 2018-03-22 DIAGNOSIS — R0602 Shortness of breath: Secondary | ICD-10-CM

## 2018-03-22 DIAGNOSIS — F410 Panic disorder [episodic paroxysmal anxiety] without agoraphobia: Secondary | ICD-10-CM

## 2018-03-22 MED ORDER — CLONAZEPAM 0.5 MG PO TABS: 0.5000 mg | ORAL_TABLET | Freq: Two times a day (BID) | ORAL | 0 refills | Status: DC | PRN

## 2018-03-22 MED ORDER — QUETIAPINE FUMARATE 50 MG PO TABS: 50.0000 mg | ORAL_TABLET | Freq: Two times a day (BID) | ORAL | 0 refills | Status: DC

## 2018-03-22 MED ORDER — FLUOXETINE HCL 40 MG PO CAPS: 40.0000 mg | ORAL_CAPSULE | Freq: Every day | ORAL | 3 refills | Status: DC

## 2018-03-22 NOTE — Patient Instructions (Addendum)
It was so nice to meet you!  I have refilled your anxiety medications- Prozac, Seroquel, and Klonopin. Both Dr. Andria Frames and I feel that you should see a psychiatrist, because your anxiety is severe.  I have scheduled an appointment for you to have lung testing done with Dr. Valentina Lucks. This testing is done in our clinic.   -Dr. Brett Albino

## 2018-03-22 NOTE — Progress Notes (Signed)
   [facility] Phone: [phone]  Subjective:  Erica Morris is a 72 year old female presenting to clinic with panic attacks and shortness of breath.  Panic Attacks: Occurring a couple days per week. States they come on out of nowhere. Last panic attack was at 4AM this morning. They last ~30 minutes or so. States she just gets very anxious. She has palpitations and shortness of breath during the attacks. No chest pain. She has been prescribed Klonopin, which doesn't help. She states she needs something stronger. She has also been prescribed Prozac and Seroquel. She has been out of Prozac for the last week.  Shortness of Breath: Occurring about once daily. The shortness of breath is sometimes related to panic attacks, but sometimes happens on its own. Endorses occasional cough and mild dyspnea on exertion. No lower extremity edema. Has been using her Albuterol once daily. Doesn't feel like this is helping. Has never had PFTs done. Has never smoked, but has been around several family members in her life that have smoked around her, including her father and brother.  ROS: See HPI for pertinent positives and negatives  Past Medical History- Diastolic CHF, HTN, hx NSTEMI, hx TIA/CVA, COPD, hypothyroidism, anxiety/depression, HLD  Family history reviewed for today's visit. No changes.  Social history- patient is a never smoker. She does have a history of passive smoke exposure  Objective: BP (!) 150/70   Pulse (!) 53   Temp 98.2 F (36.8 C) (Oral)   Wt 184 lb 3.2 oz (83.6 kg)   SpO2 97%   BMI 31.62 kg/m  Gen: NAD, alert, cooperative with exam HEENT: NCAT, EOMI, MMM CV: Bradycardic, regular rhythm, no murmur Resp: CTABL, no wheezes, normal work of breathing Msk: No edema, warm, normal tone, moves UE/LE spontaneously Neuro: Alert and oriented, no gross deficits Psych: Mildly anxious-appearing, normal rate of speech, normal affect, normal  behavior  Assessment/Plan: Anxiety: Uncontrolled. Doesn't feel like Klonopin is helping. Discussed with PCP, who agrees that it would be unsafe to increase Klonopin dose in this patient. - Refilled Klonopin 0.5mg  bid prn #60 - Refilled Prozac and Seroquel - Discussed with patient in detail that she would likely benefit from seeing a psychiatrist to manage her anxiety. Patient given information sheet with psychiatry offices in [place]. She will call to get an appointment scheduled.  Shortness of Breath: Possibly COPD due to passive smoke exposure (patient has never smoked). Has never had PFTs performed. No signs of fluid overload to suggest CHF exacerbation. No focal lung findings or fevers to suggest pneumonia. - Patient scheduled to have PFTs done with Dr. Valentina Lucks on 03/31/18 at 10:00AM - Continue Albuterol prn - Follow-up with PCP    Hyman Bible, MD PGY-3

## 2018-03-23 DIAGNOSIS — R0602 Shortness of breath: Secondary | ICD-10-CM | POA: Insufficient documentation

## 2018-03-23 NOTE — Assessment & Plan Note (Addendum)
Possibly COPD due to passive smoke exposure (patient has never smoked). Has never had PFTs performed. No signs of fluid overload to suggest CHF exacerbation. No focal lung findings or fevers to suggest pneumonia. - Patient scheduled to have PFTs done with Dr. Valentina Lucks on 03/31/18 at 10:00AM - Continue Albuterol prn - Follow-up with PCP

## 2018-03-23 NOTE — Assessment & Plan Note (Deleted)
Uncontrolled. Doesn't feel like Klonopin is helping. Discussed with PCP, who agrees that it would be unsafe to increase Klonopin dose in this patient. - Refilled Klonopin 0.5mg  bid prn #60 - Refilled Prozac and Seroquel - Discussed with patient in detail that she would likely benefit from seeing a psychiatrist to manage her anxiety. Patient given information sheet with psychiatry offices in [place]. She will call to get an appointment scheduled.

## 2018-03-23 NOTE — Assessment & Plan Note (Signed)
Uncontrolled. Doesn't feel like Klonopin is helping. Discussed with PCP, who agrees that it would be unsafe to increase Klonopin dose in this patient. - Refilled Klonopin 0.5mg  bid prn #60 - Refilled Prozac and Seroquel - Discussed with patient in detail that she would likely benefit from seeing a psychiatrist to manage her anxiety. Patient given information sheet with psychiatry offices in [place]. She will call to get an appointment scheduled.

## 2018-03-31 ENCOUNTER — Ambulatory Visit: Payer: Medicare Other | Admitting: Pharmacist

## 2018-03-31 DIAGNOSIS — I10 Essential (primary) hypertension: Secondary | ICD-10-CM | POA: Diagnosis not present

## 2018-03-31 DIAGNOSIS — R0602 Shortness of breath: Secondary | ICD-10-CM | POA: Diagnosis not present

## 2018-03-31 DIAGNOSIS — R0609 Other forms of dyspnea: Secondary | ICD-10-CM | POA: Diagnosis not present

## 2018-03-31 DIAGNOSIS — I2510 Atherosclerotic heart disease of native coronary artery without angina pectoris: Secondary | ICD-10-CM | POA: Diagnosis not present

## 2018-04-13 ENCOUNTER — Other Ambulatory Visit: Payer: Self-pay | Admitting: Internal Medicine

## 2018-04-13 DIAGNOSIS — F339 Major depressive disorder, recurrent, unspecified: Secondary | ICD-10-CM

## 2018-04-18 DIAGNOSIS — R0602 Shortness of breath: Secondary | ICD-10-CM | POA: Diagnosis not present

## 2018-04-18 DIAGNOSIS — R0609 Other forms of dyspnea: Secondary | ICD-10-CM | POA: Diagnosis not present

## 2018-04-26 DIAGNOSIS — R0609 Other forms of dyspnea: Secondary | ICD-10-CM | POA: Diagnosis not present

## 2018-04-26 DIAGNOSIS — R0602 Shortness of breath: Secondary | ICD-10-CM | POA: Diagnosis not present

## 2018-04-29 DIAGNOSIS — I10 Essential (primary) hypertension: Secondary | ICD-10-CM | POA: Diagnosis not present

## 2018-04-29 DIAGNOSIS — E782 Mixed hyperlipidemia: Secondary | ICD-10-CM | POA: Diagnosis not present

## 2018-04-29 DIAGNOSIS — I252 Old myocardial infarction: Secondary | ICD-10-CM | POA: Diagnosis not present

## 2018-04-29 DIAGNOSIS — I2510 Atherosclerotic heart disease of native coronary artery without angina pectoris: Secondary | ICD-10-CM | POA: Diagnosis not present

## 2018-05-17 DIAGNOSIS — E782 Mixed hyperlipidemia: Secondary | ICD-10-CM | POA: Diagnosis not present

## 2018-05-17 DIAGNOSIS — N390 Urinary tract infection, site not specified: Secondary | ICD-10-CM | POA: Diagnosis not present

## 2018-05-17 DIAGNOSIS — E039 Hypothyroidism, unspecified: Secondary | ICD-10-CM | POA: Diagnosis not present

## 2018-05-17 DIAGNOSIS — K219 Gastro-esophageal reflux disease without esophagitis: Secondary | ICD-10-CM | POA: Diagnosis not present

## 2018-05-17 DIAGNOSIS — I10 Essential (primary) hypertension: Secondary | ICD-10-CM | POA: Diagnosis not present

## 2018-05-17 DIAGNOSIS — Z1159 Encounter for screening for other viral diseases: Secondary | ICD-10-CM | POA: Diagnosis not present

## 2018-05-23 DIAGNOSIS — E782 Mixed hyperlipidemia: Secondary | ICD-10-CM | POA: Diagnosis not present

## 2018-05-23 DIAGNOSIS — Z0000 Encounter for general adult medical examination without abnormal findings: Secondary | ICD-10-CM | POA: Diagnosis not present

## 2018-05-23 DIAGNOSIS — E039 Hypothyroidism, unspecified: Secondary | ICD-10-CM | POA: Diagnosis not present

## 2018-05-23 DIAGNOSIS — I10 Essential (primary) hypertension: Secondary | ICD-10-CM | POA: Diagnosis not present

## 2018-05-30 ENCOUNTER — Telehealth: Payer: Self-pay | Admitting: *Deleted

## 2018-05-30 NOTE — Telephone Encounter (Signed)
Pt states that she is going on a flight next Thursday 06/09/18 and needs something for her nerves.   She states "Dr. Andria Frames know how I am".  Will forward to PCP. Fleeger, Salome Spotted, CMA

## 2018-05-31 NOTE — Telephone Encounter (Signed)
Pt called to return Dr Lowella Bandy phone call. I did not know if he was wanting to speak with her first or for me to schedule an appointment. I told her I would have him call her back.

## 2018-05-31 NOTE — Telephone Encounter (Signed)
I have tried multiple times to call patient and left two voice mails.  Ideally, I should see patient in that she moved to [place], received care there (I have records), then recently moved back to [place].  I need to verify what meds she is currently taking and her current pharmacy at a minimum.  At this point, I will wait until she returns my call.

## 2018-05-31 NOTE — Telephone Encounter (Signed)
No Rx will be sent until my questions are clarified.

## 2018-06-01 MED ORDER — LORAZEPAM 1 MG PO TABS: ORAL_TABLET | ORAL | 0 refills | Status: DC

## 2018-06-01 NOTE — Telephone Encounter (Signed)
Has been in [place], just moved back.  I don't think my doctors there changed any of my meds.  Very nervous about upcoming plane flight.  Agreed to one time anxiety medication before flight to and from.  She then said that she need refills on other meds.  I did not refill because I am not fully sure what she has been taking. Asked to make appointment with me and bring in all her meds.

## 2018-06-01 NOTE — Addendum Note (Signed)
Addended by: Zenia Resides on: 06/01/2018 11:19 AM   Modules accepted: Orders

## 2018-06-09 ENCOUNTER — Other Ambulatory Visit: Payer: Self-pay

## 2018-06-09 ENCOUNTER — Encounter: Payer: Self-pay | Admitting: Family Medicine

## 2018-06-09 ENCOUNTER — Ambulatory Visit (INDEPENDENT_AMBULATORY_CARE_PROVIDER_SITE_OTHER): Payer: Medicare Other | Admitting: Family Medicine

## 2018-06-09 DIAGNOSIS — F339 Major depressive disorder, recurrent, unspecified: Secondary | ICD-10-CM

## 2018-06-09 DIAGNOSIS — I10 Essential (primary) hypertension: Secondary | ICD-10-CM

## 2018-06-09 DIAGNOSIS — Z79899 Other long term (current) drug therapy: Secondary | ICD-10-CM | POA: Diagnosis not present

## 2018-06-09 MED ORDER — AMLODIPINE BESYLATE 5 MG PO TABS: 5.0000 mg | ORAL_TABLET | Freq: Every day | ORAL | 3 refills | Status: DC

## 2018-06-09 NOTE — Patient Instructions (Signed)
Her blood pressure is a bit up today.  She has not been taking one of her blood pressure pills, amlodipine.  I sent in a new prescription for her. Try a half mg of the ativan/lorazepam at night.  Call me Monday to let me know how it worked. For sure, make another appointment to [facility] and keep the appointment.  I want specialists handling your anxiety.   Throw away the sertraline.

## 2018-06-10 ENCOUNTER — Encounter: Payer: Self-pay | Admitting: Family Medicine

## 2018-06-10 DIAGNOSIS — Z79899 Other long term (current) drug therapy: Secondary | ICD-10-CM | POA: Insufficient documentation

## 2018-06-10 NOTE — Assessment & Plan Note (Signed)
Poor control.  Restart amlodipine.

## 2018-06-10 NOTE — Progress Notes (Signed)
   Subjective:    Patient ID: Erica Morris, female    DOB: 08/23/1946, 72 y.o.   MRN: [id number]  HPI Here to reestablish care.  Did spend 6 months in [place].  Now plans to be in [place] for the foreseeable future.  Issues for today's visit.   1. Polypharmacy.  I did my best to reconcile her meds 2. Hypertension.  BP up today.  During med rec, I realized that she was not taking her amlodipine. 3. Anxiety.  On Seroquel.  As I have often heard before, "Nothing is working."  She had an appointment with Behavioral Medicine intake which she missed.  With daughter-in-law, we agreed best to reschedule.  She is chronically and seriously mentally ill and needs psychiatry services.  Anything I do will be temporizing.     Review of Systems     Objective:   Physical Exam BP elevated Lungs clear Cardiac RRR without m or g        Assessment & Plan:

## 2018-06-10 NOTE — Assessment & Plan Note (Signed)
I did DC and/or not refill several meds.  The polypharm was worsened by her sojourn to other docs in [place].

## 2018-06-10 NOTE — Assessment & Plan Note (Signed)
She is severely and persistantly mentally ill.  I feel she needs specialist management.  In the interim, continue Prozac and seroquel.  I may prescribe some lorazapam short term until seen by psych in an effort to keep her from panicking and rushing to the ER.

## 2018-06-13 ENCOUNTER — Other Ambulatory Visit: Payer: Self-pay

## 2018-06-13 ENCOUNTER — Other Ambulatory Visit: Payer: Self-pay | Admitting: Family Medicine

## 2018-06-13 DIAGNOSIS — F410 Panic disorder [episodic paroxysmal anxiety] without agoraphobia: Secondary | ICD-10-CM

## 2018-06-13 DIAGNOSIS — F411 Generalized anxiety disorder: Secondary | ICD-10-CM

## 2018-06-13 MED ORDER — LORAZEPAM 0.5 MG PO TABS: 0.5000 mg | ORAL_TABLET | Freq: Every day | ORAL | 1 refills | Status: DC

## 2018-06-13 MED ORDER — FLUOXETINE HCL 40 MG PO CAPS: 40.0000 mg | ORAL_CAPSULE | Freq: Every day | ORAL | 3 refills | Status: DC

## 2018-06-13 NOTE — Progress Notes (Signed)
Phone call.  Tried 0.5 mg lorazepam qhs per my instructions this WE.  Worked well.  I will prescribe but only as a temporizing action until she sees psych.  Reiterated need for Psychiatry consult.

## 2018-06-15 ENCOUNTER — Telehealth: Payer: Self-pay | Admitting: Family Medicine

## 2018-06-15 DIAGNOSIS — F339 Major depressive disorder, recurrent, unspecified: Secondary | ICD-10-CM

## 2018-06-15 NOTE — Telephone Encounter (Signed)
Pt called to tell Dr. Andria Frames she could not find a Psychiatrist and she would like Dr. Andria Frames to find her one. She would like to be called at [phone] when he has found one.

## 2018-06-15 NOTE — Telephone Encounter (Signed)
Psych referral order entered.

## 2018-06-28 DIAGNOSIS — R079 Chest pain, unspecified: Secondary | ICD-10-CM | POA: Diagnosis not present

## 2018-06-28 DIAGNOSIS — R0602 Shortness of breath: Secondary | ICD-10-CM | POA: Diagnosis not present

## 2018-06-28 DIAGNOSIS — R0789 Other chest pain: Secondary | ICD-10-CM | POA: Diagnosis not present

## 2018-06-28 DIAGNOSIS — R110 Nausea: Secondary | ICD-10-CM | POA: Diagnosis not present

## 2018-06-29 ENCOUNTER — Ambulatory Visit (INDEPENDENT_AMBULATORY_CARE_PROVIDER_SITE_OTHER): Payer: Medicare Other | Admitting: Licensed Clinical Social Worker

## 2018-06-29 DIAGNOSIS — F411 Generalized anxiety disorder: Secondary | ICD-10-CM

## 2018-06-29 NOTE — Progress Notes (Signed)
Type of Service: Clinical Social Work   Erica Morris is a 72 y.o. female referred by Dr. Caprice Renshaw for assistance with managing anxiety.  Patient is pleasant and engaged in conversation. Reports the following concerns: feeling like she can't breathe due to anxiety, afraid to be home alone. Duration of problem:on going ; Severity of problem: severe Mental Health/ Substance Hx: ongoing history of anxiety and depression; PCP made referral for psychiatry.  Life & Social  Patient lives back and forth with son or daughter; uses [employer] for transportation needs; does not like being home alone with other are at school or work.  Recent life changes: moved back in with daugher Issues discussed: support system, previous and current coping skills, community resources , things patient enjoy or use to enjoy doing, education on anxiety, and demonstration of relaxed breathing.    GOALS ADDRESSED:  Patient will: 1. Increase knowledge and/or ability of: coping skills and self-management skills  2.    Reduce symptoms of: anxiety and worry Intervention: Reflective listening, supportive counseling, solutions focus strategies, community resources, emotional support, relaxed breathing ;Psychoeducation ;  Assessment/Plan:  Patient is currently experiencing  Symptoms of anxiety which are exacerbated by the fear of being alone. Patient may benefit from , and is in agreement to  1. Implement relaxed breathing 3 times a day 2. Follow up with [employer] to discuss day programs so the she can be around people during the day.  3. LCSW will F/U with patient in 5 to 10 days to see if she connected with resources. 4. Patient will make appointment with [facility] as needed.  Casimer Lanius, LCSW Licensed Clinical Social Worker [place]   [phone] 9:11 AM

## 2018-07-01 ENCOUNTER — Telehealth: Payer: Self-pay | Admitting: Licensed Clinical Social Worker

## 2018-07-01 NOTE — Progress Notes (Signed)
Service : [facility] F/U Call   F/U call to patient reference resources provided.  Unable to reach patient, left message with daughter to call LCSW.   Daughter informed LCSW that patient contacted [employer] and they are mailing her an application for the day program.    Daughter Olivia Mackie will assist patient with the application when it arrives.  Daughter and patient will contact LCSW if they have questions.  Casimer Lanius, LCSW Licensed Clinical Social Worker [place]   [phone] 3:59 PM

## 2018-07-06 ENCOUNTER — Telehealth: Payer: Self-pay | Admitting: Family Medicine

## 2018-07-06 NOTE — Telephone Encounter (Signed)
Home number listed on filed called. I left a HIPAA compliant call back message.  Please have her come in and see Hensel when she calls back. I am unable to manage medications over the telephone since I had not evaluated her before. If she is having respiratory symptoms, advise ED visit. Thanks.

## 2018-07-06 NOTE — Telephone Encounter (Signed)
Pt called and wanted Dr. Andria Frames to call her concerning her inhaler not working well enough. I told her he was on vacation and she would like for someone on his team to call her.

## 2018-07-07 ENCOUNTER — Encounter (HOSPITAL_COMMUNITY): Payer: Self-pay

## 2018-07-07 ENCOUNTER — Emergency Department (HOSPITAL_COMMUNITY)
Admission: EM | Admit: 2018-07-07 | Discharge: 2018-07-07 | Discharge status: Against Medical Advice | Payer: Medicare Other | Attending: Emergency Medicine | Admitting: Emergency Medicine

## 2018-07-07 ENCOUNTER — Emergency Department (HOSPITAL_COMMUNITY): Payer: Medicare Other

## 2018-07-07 ENCOUNTER — Other Ambulatory Visit: Payer: Self-pay

## 2018-07-07 DIAGNOSIS — R42 Dizziness and giddiness: Secondary | ICD-10-CM | POA: Insufficient documentation

## 2018-07-07 DIAGNOSIS — R51 Headache: Secondary | ICD-10-CM | POA: Insufficient documentation

## 2018-07-07 DIAGNOSIS — R52 Pain, unspecified: Secondary | ICD-10-CM | POA: Diagnosis not present

## 2018-07-07 DIAGNOSIS — R519 Headache, unspecified: Secondary | ICD-10-CM

## 2018-07-07 DIAGNOSIS — R001 Bradycardia, unspecified: Secondary | ICD-10-CM | POA: Diagnosis not present

## 2018-07-07 LAB — I-STAT CHEM 8, ED
BUN: 14 mg/dL (ref 8–23)
Calcium, Ion: 0.98 mmol/L — ABNORMAL LOW (ref 1.15–1.40)
Chloride: 103 mmol/L (ref 98–111)
Creatinine, Ser: 0.9 mg/dL (ref 0.44–1.00)
Glucose, Bld: 93 mg/dL (ref 70–99)
HCT: 38 % (ref 36.0–46.0)
Hemoglobin: 12.9 g/dL (ref 12.0–15.0)
Potassium: 3 mmol/L — ABNORMAL LOW (ref 3.5–5.1)
Sodium: 141 mmol/L (ref 135–145)
TCO2: 26 mmol/L (ref 22–32)

## 2018-07-07 LAB — DIFFERENTIAL
Abs Immature Granulocytes: 0.1 10*3/uL (ref 0.0–0.1)
Basophils Absolute: 0 10*3/uL (ref 0.0–0.1)
Basophils Relative: 0 %
Eosinophils Absolute: 0.2 10*3/uL (ref 0.0–0.7)
Eosinophils Relative: 2 %
Immature Granulocytes: 1 %
Lymphocytes Relative: 21 %
Lymphs Abs: 2 10*3/uL (ref 0.7–4.0)
Monocytes Absolute: 0.5 10*3/uL (ref 0.1–1.0)
Monocytes Relative: 5 %
Neutro Abs: 6.6 10*3/uL (ref 1.7–7.7)
Neutrophils Relative %: 71 %

## 2018-07-07 LAB — COMPREHENSIVE METABOLIC PANEL
ALT: 18 U/L (ref 0–44)
AST: 19 U/L (ref 15–41)
Albumin: 3.7 g/dL (ref 3.5–5.0)
Alkaline Phosphatase: 101 U/L (ref 38–126)
Anion gap: 11 (ref 5–15)
BUN: 14 mg/dL (ref 8–23)
CO2: 29 mmol/L (ref 22–32)
Calcium: 8.8 mg/dL — ABNORMAL LOW (ref 8.9–10.3)
Chloride: 103 mmol/L (ref 98–111)
Creatinine, Ser: 0.92 mg/dL (ref 0.44–1.00)
GFR calc Af Amer: >60 mL/min (ref >60)
GFR calc non Af Amer: >60 mL/min (ref >60)
Glucose, Bld: 89 mg/dL (ref 70–99)
Potassium: 3.1 mmol/L — ABNORMAL LOW (ref 3.5–5.1)
Sodium: 143 mmol/L (ref 135–145)
Total Bilirubin: 0.7 mg/dL (ref 0.3–1.2)
Total Protein: 6.3 g/dL — ABNORMAL LOW (ref 6.5–8.1)

## 2018-07-07 LAB — PROTIME-INR
INR: 1.04
Prothrombin Time: 13.5 seconds (ref 11.4–15.2)

## 2018-07-07 LAB — CBC
HCT: 39.7 % (ref 36.0–46.0)
Hemoglobin: 12.4 g/dL (ref 12.0–15.0)
MCH: 26.3 pg (ref 26.0–34.0)
MCHC: 31.2 g/dL (ref 30.0–36.0)
MCV: 84.3 fL (ref 78.0–100.0)
Platelets: 230 10*3/uL (ref 150–400)
RBC: 4.71 MIL/uL (ref 3.87–5.11)
RDW: 13.4 % (ref 11.5–15.5)
WBC: 9.3 10*3/uL (ref 4.0–10.5)

## 2018-07-07 LAB — APTT: aPTT: 28 seconds (ref 24–36)

## 2018-07-07 LAB — I-STAT TROPONIN, ED: Troponin i, poc: 0 ng/mL (ref 0.00–0.08)

## 2018-07-07 MED ORDER — ONDANSETRON 4 MG PO TBDP
4.0000 mg | ORAL_TABLET | Freq: Once | ORAL | Status: AC
  Administered 2018-07-07: 4 mg via ORAL
  Filled 2018-07-07: qty 1

## 2018-07-07 NOTE — ED Notes (Signed)
Called pt for vitals x2, no response.

## 2018-07-07 NOTE — ED Notes (Signed)
Patient called three times for rooming without answer.  Will d/c.

## 2018-07-07 NOTE — ED Notes (Signed)
Pt called for vitals. No answer.  

## 2018-07-07 NOTE — ED Triage Notes (Addendum)
Pt endorses sudden onset of ha, blurred vision and headache while sitting reading at 1600. CBG 88, negative stroke exam, 148/79, HR 58 SB, 99% on RA, RR 16. Axox4. Pt now states that she was having some difficulty swallowing last night.

## 2018-07-07 NOTE — ED Provider Notes (Signed)
Patient placed in Quick Look pathway, seen and evaluated   Chief Complaint: Headache  HPI:   Patient endorses a headache that started last night, resolved, and then recurred today while she was reading.  Accompanied by intermittent, bilateral blurred vision.  Also endorses some difficulty swallowing that began last night.  ROS: Headache (one)  Physical Exam:   Gen: No distress  Neuro: Awake and Alert  Skin: Warm    Focused Exam:   No diaphoresis.  No pallor.  Pulmonary: No increased work of breathing.  Speaks in full sentences without difficulty.  Lung sounds clear.  No tachypnea.  Cardiac: No tachycardia.  Neurologic: No facial droop.  No arm drift.  Grip strength equal bilaterally.  Motor function intact in all 4 extremities.  Patient endorses some difficulty swallowing, though she shows no signs of shortness of breath and seems to handle oral secretions without difficulty.  Cranial nerves III through XII otherwise grossly intact.    Initiation of care has begun. The patient has been counseled on the process, plan, and necessity for staying for the completion/evaluation, and the remainder of the medical screening examination   Layla Maw 07/07/18 1755    Drenda Freeze, MD 07/21/18 [phone]

## 2018-07-07 NOTE — ED Notes (Signed)
Pt. called for vitals. No answer. x3 

## 2018-07-07 NOTE — ED Notes (Signed)
Order clicked off for "if O2 sat <94% administer O2..." accidentally in chart.

## 2018-07-11 NOTE — Telephone Encounter (Signed)
I also tried calling.  I double checked the number because the recording indicated "Erica Morris".  No message left.  Given that 5 days have passed, I will wait until patient calls back.

## 2018-07-14 ENCOUNTER — Telehealth: Payer: Self-pay | Admitting: Family Medicine

## 2018-07-14 NOTE — Telephone Encounter (Signed)
Pt said her inhaler she was given is just not working. She would like Hensel to call her to discuss something different. Her call back number is [phone].

## 2018-07-15 NOTE — Telephone Encounter (Signed)
Please ask patient to come in and see me when I am back in the office.  I prefer her to wait until I am available since she is a challenging patient, best dealt with by her PCP.

## 2018-07-15 NOTE — Telephone Encounter (Signed)
Contacted pt and scheduled her to come in to see PCP on 08/02/18. Katharina Caper, [name], [place]

## 2018-08-02 ENCOUNTER — Other Ambulatory Visit: Payer: Self-pay

## 2018-08-02 ENCOUNTER — Encounter: Payer: Self-pay | Admitting: Family Medicine

## 2018-08-02 ENCOUNTER — Ambulatory Visit (INDEPENDENT_AMBULATORY_CARE_PROVIDER_SITE_OTHER): Payer: Medicare Other | Admitting: Family Medicine

## 2018-08-02 VITALS — BP 122/68 | HR 52 | Temp 97.8°F | Ht 64.0 in | Wt 192.4 lb

## 2018-08-02 DIAGNOSIS — I5032 Chronic diastolic (congestive) heart failure: Secondary | ICD-10-CM

## 2018-08-02 DIAGNOSIS — I50810 Right heart failure, unspecified: Secondary | ICD-10-CM

## 2018-08-02 DIAGNOSIS — F339 Major depressive disorder, recurrent, unspecified: Secondary | ICD-10-CM | POA: Diagnosis not present

## 2018-08-02 DIAGNOSIS — R079 Chest pain, unspecified: Secondary | ICD-10-CM

## 2018-08-02 DIAGNOSIS — Z23 Encounter for immunization: Secondary | ICD-10-CM | POA: Diagnosis not present

## 2018-08-02 DIAGNOSIS — F411 Generalized anxiety disorder: Secondary | ICD-10-CM | POA: Diagnosis not present

## 2018-08-02 DIAGNOSIS — J449 Chronic obstructive pulmonary disease, unspecified: Secondary | ICD-10-CM

## 2018-08-02 MED ORDER — LORAZEPAM 0.5 MG PO TABS: 0.5000 mg | ORAL_TABLET | Freq: Every day | ORAL | 1 refills | Status: DC

## 2018-08-02 MED ORDER — FUROSEMIDE 20 MG PO TABS: 20.0000 mg | ORAL_TABLET | Freq: Two times a day (BID) | ORAL | 3 refills | Status: DC

## 2018-08-02 NOTE — Patient Instructions (Addendum)
I think your big breathing problem is the extra fluid. You should be taking furosemide 20 mg per day - a fluid pill.  I will increase to twice a day to get the extra fluid off.   I will send in two refills - both should be inexpensive.  If money is limited and you can only pick up one, for sure get the furosemide for the fluid. See me in three weeks to make sure the fluid is coming off and you are breathing better.

## 2018-08-03 ENCOUNTER — Encounter: Payer: Self-pay | Admitting: Family Medicine

## 2018-08-03 NOTE — Assessment & Plan Note (Signed)
>>  ASSESSMENT AND PLAN FOR COPD SUGGESTED BY INITIAL EVALUATION ([facility]CC) WRITTEN ON 08/03/2018  2:29 PM BY HENSEL, Santiago Bumpers, MD  We did education around MDI use and she had poor coordination.  She has a spacer at home which she is not using.  Recommended that she use routinely.

## 2018-08-03 NOTE — Assessment & Plan Note (Signed)
Chronic depression is moderate today and at her baseline.  Depression and anxiety likely contributing to her symptom of SOB.

## 2018-08-03 NOTE — Assessment & Plan Note (Signed)
We did education around MDI use and she had poor coordination.  She has a spacer at home which she is not using.  Recommended that she use routinely.

## 2018-08-03 NOTE — Progress Notes (Signed)
   Subjective:    Patient ID: Erica Morris, female    DOB: 03/20/1946, 72 y.o.   MRN: [id number]  HPI Erica Morris is challenging to sort out as always.  Her main complaint is shortness of breath and leg swelling.  Preexisting diagnoses include: Diastolic CHF, normal EF on most recent echo (09/2017). No comment on diastolic dysfunction.  The PA pressures were elevated and I believe a more accurate diagnosis is Right Heart failure. She also had suspected COPD.  Never a smoker.  Exposed to passive smoking.  I have not gotten PFTs but states she did have these done in [place].  She has an albuterol inhaler, which she demonstrated poor use. Anxiety plays a significant role in her shortness of breath.  Her anxiety level is at her baseline. She states she is out of several of her medications and cannot fill them because of the co pay.  She is unclear which one she is out of.    Due for second pneumonia vaccination.  She has already gotten a flu shot at the drug stor.    Review of Systems     Objective:   Physical Exam Wt 192 with a recent low wt =178.  14lbs above her best estimate dry wt. Lungs clear, no wheeze or rales Cardiac RRR without m or g Abd benign Ext 2+ symmetric edema bilaterally.        Assessment & Plan:

## 2018-08-03 NOTE — Assessment & Plan Note (Signed)
Refill lorazapam night only.  Using benzo in elderly because I have been unable to control her panic attacks otherwise.  Unclear how much anxiety is playing in her symptom of SOB.

## 2018-08-03 NOTE — Assessment & Plan Note (Signed)
Will treat as an exacerbation of right heart CHF with the compelling evidence being her 14 lb wt gain.

## 2018-08-10 DIAGNOSIS — H35371 Puckering of macula, right eye: Secondary | ICD-10-CM | POA: Diagnosis not present

## 2018-08-10 DIAGNOSIS — H35341 Macular cyst, hole, or pseudohole, right eye: Secondary | ICD-10-CM | POA: Diagnosis not present

## 2018-08-10 DIAGNOSIS — H40011 Open angle with borderline findings, low risk, right eye: Secondary | ICD-10-CM | POA: Diagnosis not present

## 2018-08-10 DIAGNOSIS — H40001 Preglaucoma, unspecified, right eye: Secondary | ICD-10-CM | POA: Diagnosis not present

## 2018-08-10 DIAGNOSIS — M316 Other giant cell arteritis: Secondary | ICD-10-CM | POA: Diagnosis not present

## 2018-08-11 ENCOUNTER — Emergency Department (HOSPITAL_COMMUNITY): Payer: Medicare Other

## 2018-08-11 ENCOUNTER — Other Ambulatory Visit: Payer: Self-pay

## 2018-08-11 ENCOUNTER — Encounter (HOSPITAL_COMMUNITY): Payer: Self-pay | Admitting: *Deleted

## 2018-08-11 ENCOUNTER — Emergency Department (HOSPITAL_COMMUNITY)
Admission: EM | Admit: 2018-08-11 | Discharge: 2018-08-11 | Discharge status: Against Medical Advice | Payer: Medicare Other | Attending: Emergency Medicine | Admitting: Emergency Medicine

## 2018-08-11 DIAGNOSIS — R079 Chest pain, unspecified: Secondary | ICD-10-CM | POA: Diagnosis not present

## 2018-08-11 DIAGNOSIS — I10 Essential (primary) hypertension: Secondary | ICD-10-CM | POA: Diagnosis not present

## 2018-08-11 DIAGNOSIS — R6884 Jaw pain: Secondary | ICD-10-CM | POA: Insufficient documentation

## 2018-08-11 DIAGNOSIS — R51 Headache: Secondary | ICD-10-CM | POA: Insufficient documentation

## 2018-08-11 DIAGNOSIS — R52 Pain, unspecified: Secondary | ICD-10-CM | POA: Diagnosis not present

## 2018-08-11 DIAGNOSIS — M316 Other giant cell arteritis: Secondary | ICD-10-CM | POA: Diagnosis not present

## 2018-08-11 LAB — CBC WITH DIFFERENTIAL/PLATELET
Basophils Absolute: 0 10*3/uL (ref 0.0–0.1)
Basophils Relative: 0 %
Eosinophils Absolute: 0.1 10*3/uL (ref 0.0–0.7)
Eosinophils Relative: 1 %
HCT: 39.8 % (ref 36.0–46.0)
Hemoglobin: 13.2 g/dL (ref 12.0–15.0)
Lymphocytes Relative: 23 %
Lymphs Abs: 2.6 10*3/uL (ref 0.7–4.0)
MCH: 27 pg (ref 26.0–34.0)
MCHC: 33.2 g/dL (ref 30.0–36.0)
MCV: 81.6 fL (ref 78.0–100.0)
Monocytes Absolute: 0.9 10*3/uL (ref 0.1–1.0)
Monocytes Relative: 8 %
Neutro Abs: 7.4 10*3/uL (ref 1.7–7.7)
Neutrophils Relative %: 68 %
Platelets: 250 10*3/uL (ref 150–400)
RBC: 4.88 MIL/uL (ref 3.87–5.11)
RDW: 13.3 % (ref 11.5–15.5)
WBC: 11 10*3/uL — ABNORMAL HIGH (ref 4.0–10.5)

## 2018-08-11 LAB — BASIC METABOLIC PANEL
Anion gap: 14 (ref 5–15)
BUN: 16 mg/dL (ref 8–23)
CO2: 27 mmol/L (ref 22–32)
Calcium: 9.2 mg/dL (ref 8.9–10.3)
Chloride: 102 mmol/L (ref 98–111)
Creatinine, Ser: 1.03 mg/dL — ABNORMAL HIGH (ref 0.44–1.00)
GFR calc Af Amer: >60 mL/min (ref >60)
GFR calc non Af Amer: 53 mL/min — ABNORMAL LOW (ref >60)
Glucose, Bld: 99 mg/dL (ref 70–99)
Potassium: 2.4 mmol/L — CL (ref 3.5–5.1)
Sodium: 143 mmol/L (ref 135–145)

## 2018-08-11 LAB — I-STAT TROPONIN, ED: Troponin i, poc: 0.02 ng/mL (ref 0.00–0.08)

## 2018-08-11 NOTE — ED Notes (Signed)
Pt called in lobby to be roomed, no response in lobby x3.

## 2018-08-11 NOTE — ED Triage Notes (Addendum)
Pt reports R side facial pain and head pain nose pain x 2 months.  Came to day because her mouth and nose started to bother her too.  She has never been evaluated for these pain.  She is A&O x 4.  She has taken motrin for the pain without relief.

## 2018-08-11 NOTE — ED Provider Notes (Signed)
MSE was initiated and I personally evaluated the patient and placed orders (if any) at  7:48 PM on August 11, 2018.  Erica Morris is a 72 y.o. female with hx of  HTN, CVA, Non-ST elevation MI, who presents to the ED via EMS with right side face and head pain. Patient reports she has been having dizziness and blurry vision. She went to her eye doctor yesterday and he referred patient to Dr. Zigmund Daniel for further evaluation of her eyes but told her that her eyes were not the cause of her headache and she should go to the ED for evaluation. Patient now states she has developed mouth and nose pain as well on the right side. Patient did try motrin for the pain without relief. Patient rates her headache as 10/10. She states that she was unable to sleep due to the pain. Patient also reports chest pain and shortness of breath on exertion and nausea.   Review of Systems Review of Systems  Constitutional: Positive for chills. Negative for fever.  HENT: Positive for ear pain. Facial swelling: facial pain.   Eyes: Positive for visual disturbance.  Respiratory: Positive for shortness of breath.   Cardiovascular: Positive for chest pain.  Gastrointestinal: Positive for nausea.  Genitourinary: Negative for dysuria, frequency and urgency.  Musculoskeletal: Positive for neck pain.  Skin: Negative for wound.  Neurological: Positive for dizziness and headache BP (!) 153/79   Pulse (!) 53   Temp 98.3 F (36.8 C) (Oral)   Resp 16   Ht 5\' 4"  (1.626 m)   Wt 89.4 kg   SpO2 98%   BMI 33.81 kg/m   Patient is alert and oriented ENT: TM's normal, tender with palpation maxillary and frontal sinuses. Throat without edema or erythema Heart: bradycardia Neuro: patient ambulatory in triage room without difficulty.   The patient appears stable so that the remainder of the MSE may be completed by another provider.   Debroah Baller [place], [place] 08/11/18 2014    Dorie Rank, MD 08/21/18 [phone]

## 2018-08-12 ENCOUNTER — Other Ambulatory Visit: Payer: Self-pay | Admitting: Family Medicine

## 2018-08-12 ENCOUNTER — Encounter (HOSPITAL_COMMUNITY): Payer: Self-pay | Admitting: *Deleted

## 2018-08-12 ENCOUNTER — Emergency Department (HOSPITAL_COMMUNITY): Payer: Medicare Other

## 2018-08-12 ENCOUNTER — Telehealth: Payer: Self-pay | Admitting: Family Medicine

## 2018-08-12 ENCOUNTER — Other Ambulatory Visit: Payer: Self-pay

## 2018-08-12 ENCOUNTER — Emergency Department (HOSPITAL_COMMUNITY)
Admission: EM | Admit: 2018-08-12 | Discharge: 2018-08-12 | Disposition: A | Discharge status: Home / Self Care | Payer: Medicare Other | Attending: Emergency Medicine | Admitting: Emergency Medicine

## 2018-08-12 DIAGNOSIS — Z79899 Other long term (current) drug therapy: Secondary | ICD-10-CM | POA: Insufficient documentation

## 2018-08-12 DIAGNOSIS — J069 Acute upper respiratory infection, unspecified: Secondary | ICD-10-CM

## 2018-08-12 DIAGNOSIS — E876 Hypokalemia: Secondary | ICD-10-CM

## 2018-08-12 DIAGNOSIS — R05 Cough: Secondary | ICD-10-CM | POA: Diagnosis not present

## 2018-08-12 DIAGNOSIS — R51 Headache: Secondary | ICD-10-CM | POA: Diagnosis present

## 2018-08-12 DIAGNOSIS — I10 Essential (primary) hypertension: Secondary | ICD-10-CM | POA: Insufficient documentation

## 2018-08-12 DIAGNOSIS — B9789 Other viral agents as the cause of diseases classified elsewhere: Secondary | ICD-10-CM | POA: Insufficient documentation

## 2018-08-12 DIAGNOSIS — Z7982 Long term (current) use of aspirin: Secondary | ICD-10-CM | POA: Diagnosis not present

## 2018-08-12 DIAGNOSIS — G500 Trigeminal neuralgia: Secondary | ICD-10-CM

## 2018-08-12 LAB — CBC WITH DIFFERENTIAL/PLATELET
Basophils Absolute: 0 10*3/uL (ref 0.0–0.1)
Basophils Relative: 0 %
Eosinophils Absolute: 0.1 10*3/uL (ref 0.0–0.7)
Eosinophils Relative: 1 %
HCT: 37.5 % (ref 36.0–46.0)
Hemoglobin: 12.3 g/dL (ref 12.0–15.0)
Lymphocytes Relative: 19 %
Lymphs Abs: 1.5 10*3/uL (ref 0.7–4.0)
MCH: 26.8 pg (ref 26.0–34.0)
MCHC: 32.8 g/dL (ref 30.0–36.0)
MCV: 81.7 fL (ref 78.0–100.0)
Monocytes Absolute: 0.6 10*3/uL (ref 0.1–1.0)
Monocytes Relative: 8 %
Neutro Abs: 5.9 10*3/uL (ref 1.7–7.7)
Neutrophils Relative %: 72 %
Platelets: 236 10*3/uL (ref 150–400)
RBC: 4.59 MIL/uL (ref 3.87–5.11)
RDW: 13.4 % (ref 11.5–15.5)
WBC: 8.1 10*3/uL (ref 4.0–10.5)

## 2018-08-12 LAB — MAGNESIUM: Magnesium: 1.5 mg/dL — ABNORMAL LOW (ref 1.7–2.4)

## 2018-08-12 LAB — BASIC METABOLIC PANEL
Anion gap: 12 (ref 5–15)
BUN: 15 mg/dL (ref 8–23)
CO2: 29 mmol/L (ref 22–32)
Calcium: 8.9 mg/dL (ref 8.9–10.3)
Chloride: 100 mmol/L (ref 98–111)
Creatinine, Ser: 0.89 mg/dL (ref 0.44–1.00)
GFR calc Af Amer: >60 mL/min (ref >60)
GFR calc non Af Amer: >60 mL/min (ref >60)
Glucose, Bld: 101 mg/dL — ABNORMAL HIGH (ref 70–99)
Potassium: 3 mmol/L — ABNORMAL LOW (ref 3.5–5.1)
Sodium: 141 mmol/L (ref 135–145)

## 2018-08-12 LAB — I-STAT TROPONIN, ED: Troponin i, poc: 0.02 ng/mL (ref 0.00–0.08)

## 2018-08-12 MED ORDER — MAGNESIUM GLUCONATE 500 MG PO TABS
500.0000 mg | ORAL_TABLET | Freq: Once | ORAL | Status: AC
  Administered 2018-08-12: 500 mg via ORAL
  Filled 2018-08-12: qty 1

## 2018-08-12 MED ORDER — CARBAMAZEPINE 200 MG PO TABS: 100.0000 mg | ORAL_TABLET | Freq: Two times a day (BID) | ORAL | 0 refills | Status: DC

## 2018-08-12 MED ORDER — POTASSIUM CHLORIDE CRYS ER 20 MEQ PO TBCR
40.0000 meq | EXTENDED_RELEASE_TABLET | Freq: Two times a day (BID) | ORAL | 0 refills | Status: DC
Start: 2018-08-12 — End: 2018-08-12

## 2018-08-12 MED ORDER — POTASSIUM CHLORIDE CRYS ER 20 MEQ PO TBCR: 40.0000 meq | EXTENDED_RELEASE_TABLET | Freq: Two times a day (BID) | ORAL | 0 refills | Status: DC

## 2018-08-12 MED ORDER — POTASSIUM CHLORIDE CRYS ER 20 MEQ PO TBCR
40.0000 meq | EXTENDED_RELEASE_TABLET | Freq: Once | ORAL | Status: AC
  Administered 2018-08-12: 40 meq via ORAL
  Filled 2018-08-12: qty 2

## 2018-08-12 MED ORDER — METOCLOPRAMIDE HCL 5 MG/ML IJ SOLN
10.0000 mg | Freq: Once | INTRAMUSCULAR | Status: AC
  Administered 2018-08-12: 10 mg via INTRAVENOUS
  Filled 2018-08-12: qty 2

## 2018-08-12 MED ORDER — DIPHENHYDRAMINE HCL 50 MG/ML IJ SOLN
12.5000 mg | Freq: Once | INTRAMUSCULAR | Status: AC
  Administered 2018-08-12: 12.5 mg via INTRAVENOUS
  Filled 2018-08-12: qty 1

## 2018-08-12 MED ORDER — ONDANSETRON HCL 4 MG/2ML IJ SOLN
4.0000 mg | Freq: Once | INTRAMUSCULAR | Status: AC
  Administered 2018-08-12: 4 mg via INTRAVENOUS
  Filled 2018-08-12: qty 2

## 2018-08-12 MED ORDER — POTASSIUM CHLORIDE 10 MEQ/100ML IV SOLN
10.0000 meq | INTRAVENOUS | Status: AC
  Administered 2018-08-12: 10 meq via INTRAVENOUS
  Filled 2018-08-12 (×2): qty 100

## 2018-08-12 MED ORDER — CARBAMAZEPINE 200 MG PO TABS
200.0000 mg | ORAL_TABLET | Freq: Once | ORAL | Status: AC
  Administered 2018-08-12: 200 mg via ORAL
  Filled 2018-08-12: qty 1

## 2018-08-12 NOTE — ED Triage Notes (Signed)
Pt arrives with c/o headache for about a month and nausea when she tries to eat. No meds PTA. Pt was registered and triaged earlier, left prior to being fully evaluated. See lab results.

## 2018-08-12 NOTE — ED Provider Notes (Signed)
[facility] DEPT Provider Note: Georgena Spurling, MD, FACEP  CSN: [id number] MRN: [id number] ARRIVAL: 08/12/18 at [place]: RESB/RESB   CHIEF COMPLAINT  Facial Pain   HISTORY OF PRESENT ILLNESS  08/12/18 4:50 AM Erica Morris is a 72 y.o. female with a 1 month history of pain on the right side of her face.  The entire right side of her face is involved.  The pain is intermittent and she describes it as paroxysms of electricity.  It is gotten severe in the past 2 days.  It is somewhat worse with palpation or eating.  She has also been nauseated recently and has had a decreased appetite.    Past Medical History:  Diagnosis Date  . ABDOMINAL PAIN, RECURRENT 03/29/2008  . ANXIETY 10/07/2007  . Candidiasis of mouth 05/30/2010  . GERD 07/25/2007  . Hernia, hiatal   . HYPERTENSION 07/25/2007  . NSTEMI (non-ST elevated myocardial infarction) ([place]) 10/2017  . Stroke [facility])     Past Surgical History:  Procedure Laterality Date  . ABDOMINAL HYSTERECTOMY    . BREAST SURGERY     bx  . KNEE ARTHROSCOPY    . LEFT HEART CATH AND CORONARY ANGIOGRAPHY N/A 11/02/2017   Procedure: LEFT HEART CATH AND CORONARY ANGIOGRAPHY;  Surgeon: Burnell Blanks, MD;  Location: [facility];  Service: Cardiovascular;  Laterality: N/A;    Family History  Problem Relation Age of Onset  . Dementia Mother   . Alcoholism Mother   . COPD Father   . Diabetes Sister   . Hypertension Sister   . Cancer Brother        Lung CA  . COPD Brother   . Clotting disorder Brother   . Kidney disease Neg Hx   . Stroke Neg Hx     Social History   Tobacco Use  . Smoking status: Never Smoker  . Smokeless tobacco: Never Used  Substance Use Topics  . Alcohol use: Yes    Comment: occasional  . Drug use: No    Prior to Admission medications   Medication Sig Start Date End Date Taking? Authorizing Provider  albuterol (PROVENTIL HFA;VENTOLIN HFA) 108 (90 Base) MCG/ACT inhaler Inhale into the lungs every 6  (six) hours as needed for wheezing or shortness of breath.   Yes [provider]  amLODipine (NORVASC) 5 MG tablet Take 1 tablet (5 mg total) by mouth daily. 06/09/18  Yes Zenia Resides, MD  aspirin EC 81 MG tablet Take 81 mg by mouth every morning.    Yes [provider]  atorvastatin (LIPITOR) 40 MG tablet TAKE 1 TABLET BY MOUTH  DAILY 12/31/17  Yes Hensel, Jamal Collin, MD  cycloSPORINE (RESTASIS) 0.05 % ophthalmic emulsion 1 drop 2 (two) times daily.   Yes [provider]  FLUoxetine (PROZAC) 40 MG capsule Take 1 capsule (40 mg total) by mouth daily. 06/13/18  Yes Hensel, Jamal Collin, MD  fluticasone (FLONASE) 50 MCG/ACT nasal spray Place 1 spray into both nostrils 2 (two) times daily. Patient taking differently: Place 1 spray into both nostrils 2 (two) times daily as needed for allergies.  11/03/17  Yes Hosie Poisson, MD  furosemide (LASIX) 20 MG tablet Take 1 tablet (20 mg total) by mouth 2 (two) times daily. Patient taking differently: Take 20 mg by mouth daily.  08/02/18  Yes Hensel, Jamal Collin, MD  levothyroxine (SYNTHROID, LEVOTHROID) 50 MCG tablet Take 1 tablet (50 mcg total) daily before breakfast by mouth. 10/11/17  Yes Enid Derry,  [place], DO  loratadine (CLARITIN) 10 MG tablet Take 1 tablet (10 mg total) by mouth daily as needed for allergies. 11/10/17  Yes Hensel, Jamal Collin, MD  LORazepam (ATIVAN) 0.5 MG tablet Take 1 tablet (0.5 mg total) by mouth at bedtime. 08/02/18  Yes Hensel, Jamal Collin, MD  metoprolol tartrate (LOPRESSOR) 25 MG tablet Take 1 tablet (25 mg total) by mouth 2 (two) times daily. 12/15/17  Yes Hensel, Jamal Collin, MD  pantoprazole (PROTONIX) 40 MG tablet TAKE 1 TABLET BY MOUTH  DAILY Patient taking differently: Take 40 mg by mouth daily.  12/03/17  Yes Hensel, Jamal Collin, MD  Polyethyl Glycol-Propyl Glycol (SYSTANE) 0.4-0.3 % SOLN Place 1 drop into both eyes daily.   Yes [provider]  polyethylene glycol (MIRALAX / GLYCOLAX) packet Take 17 g by  mouth daily as needed. Patient taking differently: Take 17 g by mouth daily as needed for mild constipation.  11/03/17  Yes Hosie Poisson, MD  QUEtiapine (SEROQUEL) 50 MG tablet TAKE 1 TABLET BY MOUTH TWICE A DAY 04/13/18  Yes Hensel, Jamal Collin, MD  Spacer/Aero-Holding Chambers (AEROCHAMBER PLUS) inhaler Use as instructed 10/26/17   Mercy Riding, MD    Allergies Lisinopril; Codeine phosphate; and Morphine and related   REVIEW OF SYSTEMS  Negative except as noted here or in the History of Present Illness.   PHYSICAL EXAMINATION  Initial Vital Signs Blood pressure 140/82, pulse (!) 58, temperature 97.9 F (36.6 C), temperature source Oral, resp. rate (!) 23, SpO2 100 %.  Examination General: Well-developed, well-nourished female in no acute distress; appearance consistent with age of record HENT: normocephalic; atraumatic Eyes: pupils equal, round and reactive to light; extraocular muscles intact Neck: supple Heart: regular rate and rhythm Lungs: clear to auscultation bilaterally Abdomen: soft; nondistended; nontender; bowel sounds present Extremities: No deformity; full range of motion; pulses normal Neurologic: Awake, alert and oriented; motor function intact in all extremities and symmetric; no facial droop; mild tenderness of right side of face Skin: Warm and dry Psychiatric: Normal mood and affect   RESULTS  Summary of this visit's results, reviewed by myself:   EKG Interpretation  Date/Time:  Friday August 12 2018 04:53:24 EDT Ventricular Rate:  60 PR Interval:    QRS Duration: 96 QT Interval:  495 QTC Calculation: 495 R Axis:   46 Text Interpretation:  Sinus rhythm Repol abnrm, severe global ischemia (LM/MVD) No significant change was found Confirmed by Shanon Rosser [phone]) on 08/12/2018 4:57:25 AM Also confirmed by Shanon Rosser [phone]), editor Philomena Doheny [phone])  on 08/12/2018 6:58:59 AM      Laboratory Studies: Results for orders placed or performed during  the hospital encounter of 08/11/18 (from the past 24 hour(s))  CBC with Differential/Platelet     Status: Abnormal   Collection Time: 08/11/18  7:57 PM  Result Value Ref Range   WBC 11.0 (H) 4.0 - 10.5 K/uL   RBC 4.88 3.87 - 5.11 MIL/uL   Hemoglobin 13.2 12.0 - 15.0 g/dL   HCT 39.8 36.0 - 46.0 %   MCV 81.6 78.0 - 100.0 fL   MCH 27.0 26.0 - 34.0 pg   MCHC 33.2 30.0 - 36.0 g/dL   RDW 13.3 11.5 - 15.5 %   Platelets 250 150 - 400 K/uL   Neutrophils Relative % 68 %   Neutro Abs 7.4 1.7 - 7.7 K/uL   Lymphocytes Relative 23 %   Lymphs Abs 2.6 0.7 - 4.0 K/uL   Monocytes Relative 8 %   Monocytes  Absolute 0.9 0.1 - 1.0 K/uL   Eosinophils Relative 1 %   Eosinophils Absolute 0.1 0.0 - 0.7 K/uL   Basophils Relative 0 %   Basophils Absolute 0.0 0.0 - 0.1 K/uL  Basic metabolic panel     Status: Abnormal   Collection Time: 08/11/18  7:57 PM  Result Value Ref Range   Sodium 143 135 - 145 mmol/L   Potassium 2.4 (LL) 3.5 - 5.1 mmol/L   Chloride 102 98 - 111 mmol/L   CO2 27 22 - 32 mmol/L   Glucose, Bld 99 70 - 99 mg/dL   BUN 16 8 - 23 mg/dL   Creatinine, Ser 1.03 (H) 0.44 - 1.00 mg/dL   Calcium 9.2 8.9 - 10.3 mg/dL   GFR calc non Af Amer 53 (L) >60 mL/min   GFR calc Af Amer >60 >60 mL/min   Anion gap 14 5 - 15  I-Stat Troponin, ED (not at [facility])     Status: None   Collection Time: 08/11/18  8:01 PM  Result Value Ref Range   Troponin i, poc 0.02 0.00 - 0.08 ng/mL   Comment 3           Imaging Studies: Ct Head Wo Contrast  Result Date: 08/11/2018 CLINICAL DATA:  Acute severe headache, worst headache of life, history of stroke, hypertension, and STEMI, CHF, COPD EXAM: CT HEAD WITHOUT CONTRAST TECHNIQUE: Contiguous axial images were obtained from the base of the skull through the vertex without intravenous contrast. Sagittal and coronal MPR images reconstructed from axial data set. COMPARISON:  07/07/2018 FINDINGS: Brain: Normal ventricular morphology. No midline shift or mass effect. Small  vessel chronic ischemic changes of deep cerebral white matter. No intracranial hemorrhage, mass lesion or evidence acute infarction. No extra-axial fluid collections. Vascular: No hyperdense vessels Skull: Intact Sinuses/Orbits: Clear Other: N/A IMPRESSION: Atrophy with small vessel chronic ischemic changes of deep cerebral white matter. No acute intracranial abnormalities. Electronically Signed   By: Lavonia Dana M.D.   On: 08/11/2018 20:38    ED COURSE and MDM  Nursing notes and initial vitals signs, including pulse oximetry, reviewed.  Vitals:   08/12/18 0149 08/12/18 0448 08/12/18 0500 08/12/18 0515  BP: (!) 155/95 140/82 (!) 164/81 (!) 147/76  Pulse: 88 (!) 58 (!) 57 (!) 56  Resp: 18 (!) 23 18 14   Temp: 98.2 F (36.8 C) 97.9 F (36.6 C)    TempSrc: Oral Oral    SpO2: 100% 100% 100% 100%   7:00 AM IV on oral potassium repletion initiated.  Potassium loss is likely due to Lasix use.  We will start the patient on carbamazepine for her trigeminal neuralgia and have her follow-up with her PCP for titration.   PROCEDURES    ED DIAGNOSES     ICD-10-CM   1. Trigeminal neuralgia of right side of face G50.0   2. Hypokalemia due to loss of potassium E87.6        Molpus, Jenny Reichmann, MD 08/12/18 [phone]

## 2018-08-12 NOTE — Telephone Encounter (Signed)
Called patient to follow-up for emergency department visit.  During this time she was diagnosed with trigeminal neuralgia.  Potassium was also noted to be 3.0 at time of discharge.  She is currently on supplementation.  I discussed this with the patient. recommend repeat blood work next week this is been ordered she has that someone call her on Monday to schedule lab draw and chat with Lenna Sciara who is her driver for visits.  Discussed with nursing staff who will call on Monday.  Please call Monday to remind about labs. Thanks! Dorris Singh, MD  Family Medicine

## 2018-08-12 NOTE — ED Provider Notes (Signed)
  Physical Exam  BP 133/69   Pulse 60   Temp 97.9 F (36.6 C) (Oral)   Resp 19   SpO2 97%   Physical Exam  ED Course/Procedures     Procedures  MDM  Patient care assumed at 7 am. Patient has R facial pain and headaches for the last month. Came yesterday and had labs drawn that showed K 2.4 but left without being seen. CT head also negative. She is on lasix and denies any vomiting. Sign out pending potassium supplementation and reassessment.   10:43 AM Patient's headaches improved with carbamazepine and migraine cocktail. No obvious temporal tenderness to suggest temporal arteritis. Repeat K is 3.0 so only 1 potassium run given. Mg 1.5, supplemented. Dr. Florina Ou prescribed carbamazepine for trigeminal neuralgia. Will start Kdur 20 meq daily as well. Patient has chronic hypokalemia likely from her diuretics.       Drenda Freeze, MD 08/12/18 1045

## 2018-08-12 NOTE — Discharge Instructions (Signed)
It is a pleasure to take care of you.   Take tegretol for your headaches. Follow up with neurologist  Your potassium is low, take potassium daily. Recheck potassium level with your doctor in a week  See your doctor in a week   Return to ER if you have worse headaches, blurry vision, numbness, weakness, trouble speaking

## 2018-08-12 NOTE — ED Notes (Signed)
Bed: WA14 Expected date:  Expected time:  Means of arrival:  Comments: Res B 

## 2018-08-16 NOTE — Telephone Encounter (Signed)
LVM on Erica Morris's phone asking her to call our office. If she calls, please let her know that we need to get her mother in law, Erica Morris in for repeat blood work this week. Please schedule Pt for a lab visit this week. Ottis Stain, CMA

## 2018-08-17 ENCOUNTER — Encounter (INDEPENDENT_AMBULATORY_CARE_PROVIDER_SITE_OTHER): Payer: Self-pay | Admitting: Ophthalmology

## 2018-08-18 NOTE — Telephone Encounter (Signed)
Spoke with Dr. Andria Frames and he said that pt could have this drawn at her visit on Wednesday at her appointment with him. Erica Morris, [name], [place]

## 2018-08-24 ENCOUNTER — Ambulatory Visit: Payer: Self-pay | Admitting: Family Medicine

## 2018-08-25 ENCOUNTER — Encounter (INDEPENDENT_AMBULATORY_CARE_PROVIDER_SITE_OTHER): Payer: Self-pay | Admitting: Ophthalmology

## 2018-09-02 ENCOUNTER — Encounter (INDEPENDENT_AMBULATORY_CARE_PROVIDER_SITE_OTHER): Payer: Medicare Other | Admitting: Ophthalmology

## 2018-09-02 DIAGNOSIS — H35033 Hypertensive retinopathy, bilateral: Secondary | ICD-10-CM

## 2018-09-02 DIAGNOSIS — I10 Essential (primary) hypertension: Secondary | ICD-10-CM

## 2018-09-02 DIAGNOSIS — H35341 Macular cyst, hole, or pseudohole, right eye: Secondary | ICD-10-CM | POA: Diagnosis not present

## 2018-09-02 DIAGNOSIS — H43812 Vitreous degeneration, left eye: Secondary | ICD-10-CM

## 2018-09-02 DIAGNOSIS — H43822 Vitreomacular adhesion, left eye: Secondary | ICD-10-CM

## 2018-09-15 ENCOUNTER — Encounter: Payer: Self-pay | Admitting: Family Medicine

## 2018-09-15 ENCOUNTER — Other Ambulatory Visit: Payer: Self-pay

## 2018-09-15 ENCOUNTER — Ambulatory Visit (INDEPENDENT_AMBULATORY_CARE_PROVIDER_SITE_OTHER): Payer: Medicare Other | Admitting: Family Medicine

## 2018-09-15 DIAGNOSIS — F339 Major depressive disorder, recurrent, unspecified: Secondary | ICD-10-CM

## 2018-09-15 DIAGNOSIS — F410 Panic disorder [episodic paroxysmal anxiety] without agoraphobia: Secondary | ICD-10-CM | POA: Diagnosis not present

## 2018-09-15 DIAGNOSIS — G43009 Migraine without aura, not intractable, without status migrainosus: Secondary | ICD-10-CM | POA: Diagnosis not present

## 2018-09-15 DIAGNOSIS — G43909 Migraine, unspecified, not intractable, without status migrainosus: Secondary | ICD-10-CM | POA: Insufficient documentation

## 2018-09-15 DIAGNOSIS — E876 Hypokalemia: Secondary | ICD-10-CM | POA: Diagnosis not present

## 2018-09-15 DIAGNOSIS — F411 Generalized anxiety disorder: Secondary | ICD-10-CM

## 2018-09-15 DIAGNOSIS — Z0000 Encounter for general adult medical examination without abnormal findings: Secondary | ICD-10-CM | POA: Insufficient documentation

## 2018-09-15 DIAGNOSIS — Z1239 Encounter for other screening for malignant neoplasm of breast: Secondary | ICD-10-CM | POA: Diagnosis not present

## 2018-09-15 MED ORDER — DOXEPIN HCL 100 MG PO CAPS: 100.0000 mg | ORAL_CAPSULE | Freq: Every day | ORAL | 3 refills | Status: DC

## 2018-09-15 MED ORDER — CARBAMAZEPINE 200 MG PO TABS: 100.0000 mg | ORAL_TABLET | Freq: Two times a day (BID) | ORAL | 3 refills | Status: DC

## 2018-09-15 MED ORDER — POTASSIUM CHLORIDE CRYS ER 20 MEQ PO TBCR: 20.0000 meq | EXTENDED_RELEASE_TABLET | Freq: Every day | ORAL | 3 refills | Status: DC

## 2018-09-15 NOTE — Patient Instructions (Addendum)
Because you are on the fluid pill, furosemide, you will also need to take potassium every day.  I sent you in a refillable prescription. I also refilled your headache medicine.  I added one medication for itching and sleep. Someone should call about about a mammogram appointment. See me in one month.

## 2018-09-15 NOTE — Assessment & Plan Note (Signed)
Add chronic k+

## 2018-09-15 NOTE — Progress Notes (Signed)
   Subjective:    Patient ID: Erica Morris, female    DOB: Jul 03, 1946, 72 y.o.   MRN: [id number]  HPI  Erica Morris comes in for her usual complaints of  1. My nerve medicine is not working. 2. I itch all over - no rash, just itching. 3. I am not sleeping at night.   4. Wants refill on carbamazipine.  Given in ER for tic doloreaux.  States it help her when she was taking it. 5. Hypokalemia in ER.  Remains on lasix.  Will add daily K+  Anxiety is a longstanding issue and I have not been able to impact her complaints or her ER visits.  Last month I restarted an SSRI and gave her a small amount of benzo.  She had been on clonipin and I do not want longstand benzo.    Review of Systems     Objective:   Physical Exam Lungs clear Cardiac RRR without m or g Skin NO RASH.  Does have some excoriation marks. Neuro, Crainial nerves 2-12 intack. Psych: She is actually smiling and has a better affect today than most of my previous visits.  SSRI may be helping.       Assessment & Plan:

## 2018-09-16 NOTE — Assessment & Plan Note (Signed)
Perhaps some help with SSRI based on affect. Add doxepin - even though it is a [place] drug.

## 2018-09-16 NOTE — Assessment & Plan Note (Signed)
Subjective - no improvement.   Objective no ER visits for panic attacks.  Some improvement.

## 2018-09-16 NOTE — Assessment & Plan Note (Signed)
I believe SSRI is helping.  Does not look depressed or complain of depressive Sx other than sleeplessnes..  Only complains of anxiety

## 2018-09-16 NOTE — Assessment & Plan Note (Signed)
Now on carbamazapine.  Unclear dx whether Rx for tic doloreaux or prophylaxis against migraine.  Regardless, it is helping her.

## 2018-10-11 ENCOUNTER — Other Ambulatory Visit: Payer: Self-pay | Admitting: Family Medicine

## 2018-10-11 DIAGNOSIS — F411 Generalized anxiety disorder: Secondary | ICD-10-CM

## 2018-10-11 DIAGNOSIS — F339 Major depressive disorder, recurrent, unspecified: Secondary | ICD-10-CM

## 2018-10-26 ENCOUNTER — Telehealth: Payer: Self-pay

## 2018-10-26 NOTE — Telephone Encounter (Signed)
Patient called nurse office. This RN had a very hard time understanding exactly what the patient's concern is. I believe she is stating that her anxiety is bad and her right knee is hurting from a fall the other day.   Stated she has an appt Monday but then changed to she can't come in to the office until at least Monday.   Told patient that PCP would probably need to see her before prescribing anything for her knee pain. Patient then stated that her leg may be infected due to picking at it. When questioned further, patient disconnected the call.  Will route to team who is familiar with patient to try to get more information.  Call back is [phone]  Danley Danker, RN [facility])

## 2018-10-31 NOTE — Telephone Encounter (Signed)
I know the anxiety problem is chronic.  I suspect the knee problem is also.  She is a complicated patient best seen in continuity by me, her PCP.  Please call her and as to schedule an appointment with me next week when I have openings.

## 2018-11-04 NOTE — Telephone Encounter (Signed)
Contacted pt and scheduled appointment with PCP. Erica Morris, [name], [place]

## 2018-11-10 ENCOUNTER — Other Ambulatory Visit: Payer: Self-pay | Admitting: Family Medicine

## 2018-11-10 ENCOUNTER — Ambulatory Visit: Payer: Medicare Other | Admitting: Family Medicine

## 2018-11-10 DIAGNOSIS — F411 Generalized anxiety disorder: Secondary | ICD-10-CM

## 2018-11-10 DIAGNOSIS — F339 Major depressive disorder, recurrent, unspecified: Secondary | ICD-10-CM

## 2018-11-10 NOTE — Telephone Encounter (Signed)
Missed or last minute cancel of visit today.  Benzos were not meant to be long term.  Last Rx was clearly labeled "needs appointment."  Refused lorazepam refill.

## 2018-11-14 ENCOUNTER — Other Ambulatory Visit: Payer: Self-pay

## 2018-11-14 ENCOUNTER — Emergency Department (HOSPITAL_COMMUNITY)
Admission: EM | Admit: 2018-11-14 | Discharge: 2018-11-14 | Disposition: A | Discharge status: Home / Self Care | Payer: Medicare Other | Attending: Emergency Medicine | Admitting: Emergency Medicine

## 2018-11-14 ENCOUNTER — Emergency Department (HOSPITAL_COMMUNITY): Payer: Medicare Other

## 2018-11-14 ENCOUNTER — Encounter (HOSPITAL_COMMUNITY): Payer: Self-pay | Admitting: Emergency Medicine

## 2018-11-14 DIAGNOSIS — L309 Dermatitis, unspecified: Secondary | ICD-10-CM | POA: Diagnosis not present

## 2018-11-14 DIAGNOSIS — J449 Chronic obstructive pulmonary disease, unspecified: Secondary | ICD-10-CM | POA: Insufficient documentation

## 2018-11-14 DIAGNOSIS — E039 Hypothyroidism, unspecified: Secondary | ICD-10-CM | POA: Diagnosis not present

## 2018-11-14 DIAGNOSIS — I10 Essential (primary) hypertension: Secondary | ICD-10-CM | POA: Insufficient documentation

## 2018-11-14 DIAGNOSIS — Z79899 Other long term (current) drug therapy: Secondary | ICD-10-CM | POA: Diagnosis not present

## 2018-11-14 DIAGNOSIS — Z7982 Long term (current) use of aspirin: Secondary | ICD-10-CM | POA: Insufficient documentation

## 2018-11-14 DIAGNOSIS — L03115 Cellulitis of right lower limb: Secondary | ICD-10-CM | POA: Insufficient documentation

## 2018-11-14 DIAGNOSIS — M79661 Pain in right lower leg: Secondary | ICD-10-CM | POA: Diagnosis present

## 2018-11-14 DIAGNOSIS — R0602 Shortness of breath: Secondary | ICD-10-CM | POA: Diagnosis not present

## 2018-11-14 DIAGNOSIS — L259 Unspecified contact dermatitis, unspecified cause: Secondary | ICD-10-CM | POA: Insufficient documentation

## 2018-11-14 DIAGNOSIS — I252 Old myocardial infarction: Secondary | ICD-10-CM | POA: Diagnosis not present

## 2018-11-14 LAB — BASIC METABOLIC PANEL
Anion gap: 11 (ref 5–15)
BUN: 15 mg/dL (ref 8–23)
CO2: 33 mmol/L — ABNORMAL HIGH (ref 22–32)
Calcium: 8.4 mg/dL — ABNORMAL LOW (ref 8.9–10.3)
Chloride: 99 mmol/L (ref 98–111)
Creatinine, Ser: 1.04 mg/dL — ABNORMAL HIGH (ref 0.44–1.00)
GFR calc Af Amer: >60 mL/min (ref >60)
GFR calc non Af Amer: 54 mL/min — ABNORMAL LOW (ref >60)
Glucose, Bld: 115 mg/dL — ABNORMAL HIGH (ref 70–99)
Potassium: 2.9 mmol/L — ABNORMAL LOW (ref 3.5–5.1)
Sodium: 143 mmol/L (ref 135–145)

## 2018-11-14 LAB — CBC WITH DIFFERENTIAL/PLATELET
Abs Immature Granulocytes: 0.05 10*3/uL (ref 0.00–0.07)
Basophils Absolute: 0 10*3/uL (ref 0.0–0.1)
Basophils Relative: 0 %
Eosinophils Absolute: 0.2 10*3/uL (ref 0.0–0.5)
Eosinophils Relative: 2 %
HCT: 34.7 % — ABNORMAL LOW (ref 36.0–46.0)
Hemoglobin: 10.8 g/dL — ABNORMAL LOW (ref 12.0–15.0)
Immature Granulocytes: 1 %
Lymphocytes Relative: 19 %
Lymphs Abs: 1.6 10*3/uL (ref 0.7–4.0)
MCH: 27.3 pg (ref 26.0–34.0)
MCHC: 31.1 g/dL (ref 30.0–36.0)
MCV: 87.6 fL (ref 80.0–100.0)
Monocytes Absolute: 0.6 10*3/uL (ref 0.1–1.0)
Monocytes Relative: 7 %
Neutro Abs: 5.7 10*3/uL (ref 1.7–7.7)
Neutrophils Relative %: 71 %
Platelets: 213 10*3/uL (ref 150–400)
RBC: 3.96 MIL/uL (ref 3.87–5.11)
RDW: 13.2 % (ref 11.5–15.5)
WBC: 8.2 10*3/uL (ref 4.0–10.5)
nRBC: 0 % (ref 0.0–0.2)

## 2018-11-14 MED ORDER — POTASSIUM CHLORIDE CRYS ER 20 MEQ PO TBCR
40.0000 meq | EXTENDED_RELEASE_TABLET | Freq: Once | ORAL | Status: AC
  Administered 2018-11-14: 40 meq via ORAL
  Filled 2018-11-14: qty 2

## 2018-11-14 MED ORDER — DOXYCYCLINE HYCLATE 100 MG PO CAPS: 100.0000 mg | ORAL_CAPSULE | Freq: Two times a day (BID) | ORAL | 0 refills | Status: DC

## 2018-11-14 MED ORDER — STERILE WATER FOR INJECTION IJ SOLN
INTRAMUSCULAR | Status: AC
  Administered 2018-11-14: 1.2 mL
  Filled 2018-11-14: qty 10

## 2018-11-14 MED ORDER — CEFTRIAXONE SODIUM 1 G IJ SOLR
1.0000 g | Freq: Once | INTRAMUSCULAR | Status: AC
  Administered 2018-11-14: 1 g via INTRAMUSCULAR
  Filled 2018-11-14: qty 10

## 2018-11-14 MED ORDER — DEXAMETHASONE SODIUM PHOSPHATE 10 MG/ML IJ SOLN
10.0000 mg | Freq: Once | INTRAMUSCULAR | Status: AC
  Administered 2018-11-14: 10 mg via INTRAMUSCULAR
  Filled 2018-11-14: qty 1

## 2018-11-14 NOTE — ED Provider Notes (Signed)
[place] DEPT Provider Note   CSN: [id number] Arrival date & time: 11/14/18  0301     History   Chief Complaint Chief Complaint  Patient presents with  . Shortness of Breath  . Leg Pain    HPI Erica Morris is a 73 y.o. female.  Patient presents to the emergency department for evaluation of right leg pain.  Patient reports that she started having severe itching to the anterior portion of the right lower leg several days ago.  Daughter has been monitoring it.  There are multiple open wounds that are likely from scratching.  Daughter has been using Neosporin followed by Bactroban on the area but patient reports that the pain has worsened and the daughter is concerned about the amount of redness, thinks it might be infected.  No fever.  Patient does report that she feels like she has been short of breath.  She is concerned because she has a history of congestive heart failure.  She describes this as inability to breathe through her nose, however.  She is not experiencing chest pain, pleuritic pain.  She has not had any significant weight gain.     Past Medical History:  Diagnosis Date  . ABDOMINAL PAIN, RECURRENT 03/29/2008  . ANXIETY 10/07/2007  . Candidiasis of mouth 05/30/2010  . GERD 07/25/2007  . Hernia, hiatal   . HYPERTENSION 07/25/2007  . NSTEMI (non-ST elevated myocardial infarction) ([place]) 10/2017  . Stroke [facility])     Patient Active Problem List   Diagnosis Date Noted  . Migraine 09/15/2018  . Screening for breast cancer 09/15/2018  . Polypharmacy 06/10/2018  . Panic attack   . TIA (transient ischemic attack) 01/09/2018  . COPD suggested by initial evaluation ([place]) 01/06/2018  . Chest pain 10/28/2017  . NSTEMI (non-ST elevated myocardial infarction) ([place]) 10/28/2017  . Vertigo 08/10/2017  . 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[place]) 03/10/2017  . 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 Hypothyroidism  11/17/2016  . Episode of recurrent major depressive disorder ([place])   . History of CVA (cerebrovascular accident)   . Pure hypercholesterolemia 11/06/2016  . Chronic venous insufficiency 11/05/2016  . Peripheral neuropathy 11/05/2016  . Cerebrovascular disease 06/09/2016  . Orthostatic hypotension 06/09/2016  . Hypokalemia 02/20/2015  . History of colonic polyps 06/04/2014  . Chronic constipation 03/29/2014  . Anxiety state 10/07/2007  . Essential hypertension 07/25/2007  . GERD 07/25/2007    Past Surgical History:  Procedure Laterality Date  . ABDOMINAL HYSTERECTOMY    . BREAST SURGERY     bx  . KNEE ARTHROSCOPY    . LEFT HEART CATH AND CORONARY ANGIOGRAPHY N/A 11/02/2017   Procedure: LEFT HEART CATH AND CORONARY ANGIOGRAPHY;  Surgeon: Burnell Blanks, MD;  Location: [facility];  Service: Cardiovascular;  Laterality: N/A;     OB History   No obstetric history on file.      Home Medications    Prior to Admission medications   Medication Sig Start Date End Date Taking? Authorizing Provider  acetaminophen (TYLENOL) 500 MG tablet Take 1,000 mg by mouth every 6 (six) hours as needed for moderate pain.   Yes [provider]  albuterol (PROVENTIL HFA;VENTOLIN HFA) 108 (90 Base) MCG/ACT inhaler Inhale 2 puffs into the lungs every 6 (six) hours as needed for wheezing or shortness of breath.    Yes [provider]  amLODipine (NORVASC) 5 MG tablet Take 1 tablet (5 mg total) by mouth daily. 06/09/18  Yes Zenia Resides, MD  aspirin EC 81 MG tablet Take 81 mg by mouth every morning.    Yes [provider]  atorvastatin (LIPITOR) 40 MG tablet TAKE 1 TABLET BY MOUTH  DAILY Patient taking differently: Take 40 mg by mouth daily.  12/31/17  Yes Hensel, Jamal Collin, MD  carbamazepine (TEGRETOL) 200 MG tablet Take 0.5 tablets (100 mg total) by mouth 2 (two) times daily. 09/15/18  Yes Hensel, Jamal Collin, MD  cycloSPORINE (RESTASIS) 0.05 % ophthalmic  emulsion 1 drop 2 (two) times daily.   Yes [provider]  doxepin (SINEQUAN) 100 MG capsule Take 1 capsule (100 mg total) by mouth at bedtime. For itching and sleep 09/15/18  Yes Hensel, Jamal Collin, MD  FLUoxetine (PROZAC) 40 MG capsule Take 1 capsule (40 mg total) by mouth daily. 06/13/18  Yes Hensel, Jamal Collin, MD  fluticasone (FLONASE) 50 MCG/ACT nasal spray Place 1 spray into both nostrils 2 (two) times daily. Patient taking differently: Place 1 spray into both nostrils 2 (two) times daily as needed for allergies.  11/03/17  Yes Hosie Poisson, MD  furosemide (LASIX) 20 MG tablet Take 1 tablet (20 mg total) by mouth 2 (two) times daily. Patient taking differently: Take 20 mg by mouth daily.  08/02/18  Yes Hensel, Jamal Collin, MD  levothyroxine (SYNTHROID, LEVOTHROID) 50 MCG tablet Take 1 tablet (50 mcg total) daily before breakfast by mouth. 10/11/17  Yes Enid Derry, [place], DO  LORazepam (ATIVAN) 0.5 MG tablet Take 0.5 mg by mouth every 8 (eight) hours as needed for anxiety.   Yes [provider]  metoprolol tartrate (LOPRESSOR) 25 MG tablet Take 1 tablet (25 mg total) by mouth 2 (two) times daily. 12/15/17  Yes Hensel, Jamal Collin, MD  pantoprazole (PROTONIX) 40 MG tablet TAKE 1 TABLET BY MOUTH  DAILY Patient taking differently: Take 40 mg by mouth daily.  12/03/17  Yes Hensel, Jamal Collin, MD  Polyethyl Glycol-Propyl Glycol (SYSTANE) 0.4-0.3 % SOLN Place 1 drop into both eyes daily.   Yes [provider]  potassium chloride SA (K-DUR,KLOR-CON) 20 MEQ tablet Take 1 tablet (20 mEq total) by mouth daily. Patient taking differently: Take 20 mEq by mouth 2 (two) times daily.  09/15/18  Yes Hensel, Jamal Collin, MD  QUEtiapine (SEROQUEL) 50 MG tablet TAKE 1 TABLET BY MOUTH TWICE A DAY Patient taking differently: Take 50 mg by mouth at bedtime.  04/13/18  Yes Hensel, Jamal Collin, MD  doxycycline (VIBRAMYCIN) 100 MG capsule Take 1 capsule (100 mg total) by mouth 2 (two) times daily. 11/14/18    Orpah Greek, MD  loratadine (CLARITIN) 10 MG tablet Take 1 tablet (10 mg total) by mouth daily as needed for allergies. Patient not taking: Reported on 11/14/2018 11/10/17   Zenia Resides, MD  polyethylene glycol (MIRALAX / GLYCOLAX) packet Take 17 g by mouth daily as needed. Patient not taking: Reported on 11/14/2018 11/03/17   Hosie Poisson, MD  Spacer/Aero-Holding Chambers (AEROCHAMBER PLUS) inhaler Use as instructed 10/26/17   Mercy Riding, MD    Family History Family History  Problem Relation Age of Onset  . Dementia Mother   . Alcoholism Mother   . COPD Father   . Diabetes Sister   . Hypertension Sister   . Cancer Brother        Lung CA  . COPD Brother   . Clotting disorder Brother   . Kidney disease Neg Hx   . Stroke Neg Hx     Social  History Social History   Tobacco Use  . Smoking status: Never Smoker  . Smokeless tobacco: Never Used  Substance Use Topics  . Alcohol use: Yes    Comment: occasional  . Drug use: No     Allergies   Lisinopril; Codeine phosphate; and Morphine and related   Review of Systems Review of Systems  Constitutional: Negative for fever.  Respiratory: Positive for shortness of breath.   Musculoskeletal:       Leg pain  Skin: Positive for wound.  All other systems reviewed and are negative.    Physical Exam Updated Vital Signs BP (!) 142/67 (BP Location: Right Arm)   Pulse 62   Temp 98.1 F (36.7 C) (Oral)   Resp 18   Ht 5\' 4"  (1.626 m)   Wt 95.7 kg   SpO2 99%   BMI 36.22 kg/m   Physical Exam Vitals signs and nursing note reviewed.  Constitutional:      General: She is not in acute distress.    Appearance: Normal appearance. She is well-developed.  HENT:     Head: Normocephalic and atraumatic.     Right Ear: Hearing normal.     Left Ear: Hearing normal.     Nose: Nose normal.  Eyes:     Conjunctiva/sclera: Conjunctivae normal.     Pupils: Pupils are equal, round, and reactive to light.  Neck:      Musculoskeletal: Normal range of motion and neck supple.  Cardiovascular:     Rate and Rhythm: Regular rhythm.     Heart sounds: S1 normal and S2 normal. No murmur. No friction rub. No gallop.   Pulmonary:     Effort: Pulmonary effort is normal. No respiratory distress.     Breath sounds: Normal breath sounds.  Chest:     Chest wall: No tenderness.  Abdominal:     General: Bowel sounds are normal.     Palpations: Abdomen is soft.     Tenderness: There is no abdominal tenderness. There is no guarding or rebound. Negative signs include Murphy's sign and McBurney's sign.     Hernia: No hernia is present.  Musculoskeletal: Normal range of motion.     Right lower leg: She exhibits no tenderness and no swelling. No edema.     Comments: No calf tenderness, no calf swelling, no venous cords  Skin:    General: Skin is warm and dry.     Findings: No rash.     Comments: Multiple small excoriations and ulcerations anterior right lower leg.  Each wound opening associated with slight surrounding erythema.  No induration.  No drainage.  Neurological:     Mental Status: She is alert and oriented to person, place, and time.     GCS: GCS eye subscore is 4. GCS verbal subscore is 5. GCS motor subscore is 6.     Cranial Nerves: No cranial nerve deficit.     Sensory: No sensory deficit.     Coordination: Coordination normal.  Psychiatric:        Speech: Speech normal.        Behavior: Behavior normal.        Thought Content: Thought content normal.      ED Treatments / Results  Labs (all labs ordered are listed, but only abnormal results are displayed) Labs Reviewed  CBC WITH DIFFERENTIAL/PLATELET - Abnormal; Notable for the following components:      Result Value   Hemoglobin 10.8 (*)    HCT 34.7 (*)  All other components within normal limits  BASIC METABOLIC PANEL - Abnormal; Notable for the following components:   Potassium 2.9 (*)    CO2 33 (*)    Glucose, Bld 115 (*)    Creatinine,  Ser 1.04 (*)    Calcium 8.4 (*)    GFR calc non Af Amer 54 (*)    All other components within normal limits    EKG None  Radiology Dg Chest 2 View  Result Date: 11/14/2018 CLINICAL DATA:  Shortness of breath. EXAM: CHEST - 2 VIEW COMPARISON:  01/09/2018 FINDINGS: The cardiomediastinal contours are normal. Atherosclerosis of the thoracic aorta. Pulmonary vasculature is normal. No consolidation, pleural effusion, or pneumothorax. No acute osseous abnormalities are seen. IMPRESSION: 1. No acute findings. 2.  Aortic Atherosclerosis (ICD10-I70.0). Electronically Signed   By: Keith Rake M.D.   On: 11/14/2018 05:08    Procedures Procedures (including critical care time)  Medications Ordered in ED Medications  potassium chloride SA (K-DUR,KLOR-CON) CR tablet 40 mEq (40 mEq Oral Given 11/14/18 0512)  cefTRIAXone (ROCEPHIN) injection 1 g (1 g Intramuscular Given 11/14/18 0512)  dexamethasone (DECADRON) injection 10 mg (10 mg Intramuscular Given 11/14/18 0513)  sterile water (preservative free) injection (1.2 mLs  Given 11/14/18 0514)     Initial Impression / Assessment and Plan / ED Course  I have reviewed the triage vital signs and the nursing notes.  Pertinent labs & imaging results that were available during my care of the patient were reviewed by me and considered in my medical decision making (see chart for details).     Patient presents with pain and swelling of right lower leg.  Symptoms entirely on anterior portion.  No posterior tenderness, swelling.  No concern for DVT.  There are multiple excoriations.  Patient reports that she has had severe pruritus and has been scratching at the area continuously.  There appears to be some superinfection.  Patient does not appear ill or septic.  Patient administered Decadron for presumed allergic reaction or dermatitis that precipitated the wounds.  Treat superinfection with antibiotics.  Patient did report some mild shortness of  breath.  Oxygenation is 99% on room air.  She is not tachypneic, not tachycardic.  There is no chest pain.  I do not suspect PE.  Chest x-ray has been obtained.  No evidence of congestive heart failure or pneumonia.  Final Clinical Impressions(s) / ED Diagnoses   Final diagnoses:  Dermatitis  Cellulitis of right lower extremity    ED Discharge Orders         Ordered    doxycycline (VIBRAMYCIN) 100 MG capsule  2 times daily     11/14/18 0520           Orpah Greek, MD 11/14/18 [id number]

## 2018-11-14 NOTE — ED Triage Notes (Signed)
Patient complaining of SOB, right lower leg pain, and anxiety. Patient states she has a hx of chf. Patient states that the right lower leg is burning, stingy, dull pain. Patient leg is warm to touch. Patient thinks that the leg is infected. This has been going on for a few months. Patient states she has shortness of breathe is making hard for her to breathe.

## 2018-12-12 ENCOUNTER — Other Ambulatory Visit: Payer: Self-pay | Admitting: Family Medicine

## 2018-12-14 ENCOUNTER — Ambulatory Visit (INDEPENDENT_AMBULATORY_CARE_PROVIDER_SITE_OTHER): Payer: Medicare Other | Admitting: Family Medicine

## 2018-12-14 ENCOUNTER — Other Ambulatory Visit: Payer: Self-pay

## 2018-12-14 ENCOUNTER — Emergency Department (HOSPITAL_COMMUNITY)
Admission: EM | Admit: 2018-12-14 | Discharge: 2018-12-15 | Discharge status: Against Medical Advice | Payer: Medicare Other | Attending: Emergency Medicine | Admitting: Emergency Medicine

## 2018-12-14 ENCOUNTER — Encounter: Payer: Self-pay | Admitting: Family Medicine

## 2018-12-14 ENCOUNTER — Emergency Department (HOSPITAL_COMMUNITY): Payer: Medicare Other

## 2018-12-14 DIAGNOSIS — J449 Chronic obstructive pulmonary disease, unspecified: Secondary | ICD-10-CM

## 2018-12-14 DIAGNOSIS — R05 Cough: Secondary | ICD-10-CM

## 2018-12-14 DIAGNOSIS — E7800 Pure hypercholesterolemia, unspecified: Secondary | ICD-10-CM | POA: Diagnosis not present

## 2018-12-14 DIAGNOSIS — I50810 Right heart failure, unspecified: Secondary | ICD-10-CM

## 2018-12-14 DIAGNOSIS — R059 Cough, unspecified: Secondary | ICD-10-CM

## 2018-12-14 DIAGNOSIS — E039 Hypothyroidism, unspecified: Secondary | ICD-10-CM | POA: Diagnosis not present

## 2018-12-14 DIAGNOSIS — I679 Cerebrovascular disease, unspecified: Secondary | ICD-10-CM | POA: Diagnosis not present

## 2018-12-14 DIAGNOSIS — R079 Chest pain, unspecified: Secondary | ICD-10-CM | POA: Diagnosis not present

## 2018-12-14 DIAGNOSIS — Z5321 Procedure and treatment not carried out due to patient leaving prior to being seen by health care provider: Secondary | ICD-10-CM | POA: Insufficient documentation

## 2018-12-14 DIAGNOSIS — R0602 Shortness of breath: Secondary | ICD-10-CM | POA: Diagnosis present

## 2018-12-14 DIAGNOSIS — I10 Essential (primary) hypertension: Secondary | ICD-10-CM

## 2018-12-14 DIAGNOSIS — R0789 Other chest pain: Secondary | ICD-10-CM | POA: Diagnosis not present

## 2018-12-14 MED ORDER — SODIUM CHLORIDE 0.9% FLUSH: 3.0000 mL | Freq: Once | INTRAVENOUS | Status: DC

## 2018-12-14 MED ORDER — ALBUTEROL SULFATE HFA 108 (90 BASE) MCG/ACT IN AERS: 2.0000 | INHALATION_SPRAY | Freq: Four times a day (QID) | RESPIRATORY_TRACT | 6 refills | Status: DC | PRN

## 2018-12-14 MED ORDER — AMLODIPINE BESYLATE 10 MG PO TABS: 10.0000 mg | ORAL_TABLET | Freq: Every day | ORAL | 3 refills | Status: DC

## 2018-12-14 MED ORDER — ATORVASTATIN CALCIUM 40 MG PO TABS: 40.0000 mg | ORAL_TABLET | Freq: Every day | ORAL | 3 refills | Status: DC

## 2018-12-14 MED ORDER — LEVOTHYROXINE SODIUM 50 MCG PO TABS: 50.0000 ug | ORAL_TABLET | Freq: Every day | ORAL | 4 refills | Status: DC

## 2018-12-14 NOTE — Patient Instructions (Signed)
I refilled the three medicines that you needed. I also sent in a new prescription for a higher dose of amlodipine.  You can use up the 5 mg tabs you have bay taking two a day. This is to treat your high blood pressure I want to wait on treating the cough until I see the chest x ray.   I will call after you get the chest x ray done.

## 2018-12-14 NOTE — ED Triage Notes (Signed)
BIB EMS from home. Pt reports cough present since Christmas, was seen at PCP earlier today and was scheduled to have CT Chest tomorrow. Pt then developed CP earlier this evening and felt as if she couldn't wait so she called 911. VSS.

## 2018-12-15 ENCOUNTER — Other Ambulatory Visit: Payer: Self-pay

## 2018-12-15 ENCOUNTER — Encounter: Payer: Self-pay | Admitting: Family Medicine

## 2018-12-15 ENCOUNTER — Ambulatory Visit (INDEPENDENT_AMBULATORY_CARE_PROVIDER_SITE_OTHER): Payer: Medicare Other | Admitting: Student in an Organized Health Care Education/Training Program

## 2018-12-15 DIAGNOSIS — R51 Headache: Secondary | ICD-10-CM

## 2018-12-15 DIAGNOSIS — I10 Essential (primary) hypertension: Secondary | ICD-10-CM | POA: Diagnosis not present

## 2018-12-15 DIAGNOSIS — R519 Headache, unspecified: Secondary | ICD-10-CM

## 2018-12-15 DIAGNOSIS — R42 Dizziness and giddiness: Secondary | ICD-10-CM | POA: Diagnosis not present

## 2018-12-15 LAB — CBC
HCT: 35.5 % — ABNORMAL LOW (ref 36.0–46.0)
Hemoglobin: 11.2 g/dL — ABNORMAL LOW (ref 12.0–15.0)
MCH: 27.5 pg (ref 26.0–34.0)
MCHC: 31.5 g/dL (ref 30.0–36.0)
MCV: 87 fL (ref 80.0–100.0)
Platelets: 252 10*3/uL (ref 150–400)
RBC: 4.08 MIL/uL (ref 3.87–5.11)
RDW: 12.9 % (ref 11.5–15.5)
WBC: 8.9 10*3/uL (ref 4.0–10.5)
nRBC: 0 % (ref 0.0–0.2)

## 2018-12-15 LAB — BASIC METABOLIC PANEL
Anion gap: 9 (ref 5–15)
BUN: 9 mg/dL (ref 8–23)
CO2: 27 mmol/L (ref 22–32)
Calcium: 8.6 mg/dL — ABNORMAL LOW (ref 8.9–10.3)
Chloride: 107 mmol/L (ref 98–111)
Creatinine, Ser: 1.09 mg/dL — ABNORMAL HIGH (ref 0.44–1.00)
GFR calc Af Amer: 59 mL/min — ABNORMAL LOW (ref >60)
GFR calc non Af Amer: 51 mL/min — ABNORMAL LOW (ref >60)
Glucose, Bld: 106 mg/dL — ABNORMAL HIGH (ref 70–99)
Potassium: 3.2 mmol/L — ABNORMAL LOW (ref 3.5–5.1)
Sodium: 143 mmol/L (ref 135–145)

## 2018-12-15 LAB — I-STAT TROPONIN, ED: Troponin i, poc: 0 ng/mL (ref 0.00–0.08)

## 2018-12-15 NOTE — Assessment & Plan Note (Signed)
Uncertain etiology. Nonspecific complaint. May be related to significant anxiety. She does not endorse blurry vision or nausea. Patient is on ativan, which her daughter states is once daily but the patient feels is a twice daily medicine. She has significant anxiety, so she is likely a delicate balancing act with anxiety and her medications.  - ensure she is only taking the ativan at bedtime - may need to consider tapering this now that she is getting older - patient to follow up with PCP in one week - return precautions/reasons to call sooner were discussed

## 2018-12-15 NOTE — Assessment & Plan Note (Signed)
Clinically not CHF.  Muddled by uncertain dry wt.  I suspect dry wt now 200+lbs

## 2018-12-15 NOTE — ED Notes (Signed)
No answer for vitals recheck

## 2018-12-15 NOTE — Assessment & Plan Note (Signed)
Increase amlodipine from 5-10 mg daily.

## 2018-12-15 NOTE — Progress Notes (Signed)
CC: follow up HTN  HPI: Erica Morris is a 73 y.o. female with PMH significant for hx of CVA, HLD, TIA, NSTEMI, anxiety state, panic attacks, osteoarthritis who presents today for follow up of high blood pressure.   She presents with daughter who helps provide history.  Patient Active Problem List   Diagnosis Date Noted  . Wooziness 12/15/2018  . Migraine 09/15/2018  . Polypharmacy 06/10/2018  . Panic attack   . TIA (transient ischemic attack) 01/09/2018  . COPD suggested by initial evaluation ([place]) 01/06/2018  . Cough 10/28/2017  . NSTEMI (non-ST elevated myocardial infarction) ([place]) 10/28/2017  . Vertigo 08/10/2017  . 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[place]) 03/10/2017  . 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 Hypothyroidism 11/17/2016  . Episode of recurrent major depressive disorder ([place])   . History of CVA (cerebrovascular accident)   . Pure hypercholesterolemia 11/06/2016  . Chronic venous insufficiency 11/05/2016  . Peripheral neuropathy 11/05/2016  . Cerebrovascular disease 06/09/2016  . Orthostatic hypotension 06/09/2016  . Hypokalemia 02/20/2015  . History of colonic polyps 06/04/2014  . Chronic constipation 03/29/2014  . Anxiety state 10/07/2007  . Essential hypertension 07/25/2007  . GERD 07/25/2007   HTN Patient was seen in our office by Dr. Andria Frames yesterday.  At that visit she was noted to have elevated blood pressure at 172/94 and her Norvasc was increased to 10 mg daily.  She subsequently went to the emergency department yesterday evening and left without being seen due to the wait. During the triage process she did undergo istat troponin which was 0.00, BMP which was notable for K of 3.2 (seems to be near patient's baseline of hypokalemia, previous check was 2.9, 3.0), and creatinine that was at the patient's baseline of about 1. CBC which was unremarkable. Her BP in the ED 169/81.  She reports that she took the 10 mg norvasc  yesterday but had a BP elevated at 161 systolic yesterday evening and so EMS was called.  Blood pressure remains elevated for EMS and so she is brought into the emergency department.  She reports that she has had a headache, but no blurry vision.  She is felt "swimmy headed."  Blood pressure appears to be very well controlled at 132/74 today in the office.   Of note the patient also has significant anxiety for which she is on prozac.  She additionally takes Ativan 0.5 mg either once or twice daily.  The patient thinks that she takes this twice daily, the daughter that is with her at bedside believes that she takes Ativan once daily bed time.  "Swimmy headed" Patient reports that she feels "swimmy headed," or "drunk." She does not feel this when she first wakes up in the morning, but it seems to bother her throughout the day. She reports that there have not been any medication changes other than the norvasc dose. She does not feel the sensation when she goes from sitting to standing, though orthostatic hypotension is on her problem list.  Review of Symptoms:  See HPI for ROS.   CC, SH/smoking status, and VS noted.  Objective: BP 132/74   Pulse (!) 53   Temp 97.9 F (36.6 C) (Oral)   Wt 207 lb 6.4 oz (94.1 kg)   SpO2 98%   BMI 35.60 kg/m  GEN: NAD, alert, cooperative, and pleasant. EYE: no conjunctival injection, pupils equally round and reactive to light ENMT: normal tympanic light reflex, no nasal polyps,no rhinorrhea, no pharyngeal erythema  or exudates NECK: full ROM, no thyromegaly RESPIRATORY: clear to auscultation bilaterally with no wheezes, rhonchi or rales, good effort CV: RRR, no m/r/g GI: soft, non-tender, non-distended, no hepatosplenomegaly SKIN: warm and dry, no rashes or lesions NEURO: II-XII grossly intact, normal gait, peripheral sensation intact PSYCH: AAOx3, appropriate affect, answers questions appropriately  Assessment and plan:  Essential hypertension Patient is  very anxious.  She does have numerous risk factors including history of TIA, and STEMI.  BP is very well controlled today in the office at 132/74.  No red flags by history or exam. - Likely that Norvasc will continue to improve her blood pressure over the next 1 to 2 days.   - Will not make any changes to her blood pressure regimen in the office today.   - Return precautions were given. -Given the patients significant anxiety we will schedule close follow-up in 1 week for her to be rechecked by physician  Wooziness Uncertain etiology. Nonspecific complaint. May be related to significant anxiety. She does not endorse blurry vision or nausea. Patient is on ativan, which her daughter states is once daily but the patient feels is a twice daily medicine. She has significant anxiety, so she is likely a delicate balancing act with anxiety and her medications.  - ensure she is only taking the ativan at bedtime - may need to consider tapering this now that she is getting older - patient to follow up with PCP in one week - return precautions/reasons to call sooner were discussed   Everrett Coombe, MD,MS,  PGY3 12/15/2018 9:30 AM

## 2018-12-15 NOTE — Patient Instructions (Signed)
It was a pleasure seeing you today in our clinic. Your blood pressure looks wonderful!  Please schedule follow up in the next week so we can monitor and be sure you continue to do well.   Our clinic's number is [phone]. Please call with questions or concerns about what we discussed today.  Be well, Dr. Burr Medico

## 2018-12-15 NOTE — Progress Notes (Signed)
Established Patient Office Visit  Subjective:  Patient ID: Erica Morris, female    DOB: 1946/06/28  Age: 73 y.o. MRN: [id number]  CC:  Chief Complaint  Patient presents with  . Cough    HPI Erica Morris presents for cough x 2 weeks.  No fever.  Multiple family members sick with URI.  Denies SOB.  Just cough.  No known COPD and never smoker.  She has been exposed to considerable passive smoking.   Also BP elevated.  Reviewed meds.  States compliance Needs refill on proair, atorvastin and levothyroxine. CHF.  Previous dry wt =178.  Wt has been raising steadily.  Admits to dietary indiscretion.  I suspect her dry wt now is considerably higher  Denies ankle swelling.  Past Medical History:  Diagnosis Date  . ABDOMINAL PAIN, RECURRENT 03/29/2008  . ANXIETY 10/07/2007  . Candidiasis of mouth 05/30/2010  . GERD 07/25/2007  . Hernia, hiatal   . HYPERTENSION 07/25/2007  . NSTEMI (non-ST elevated myocardial infarction) ([place]) 10/2017  . Stroke [facility])     Past Surgical History:  Procedure Laterality Date  . ABDOMINAL HYSTERECTOMY    . BREAST SURGERY     bx  . KNEE ARTHROSCOPY    . LEFT HEART CATH AND CORONARY ANGIOGRAPHY N/A 11/02/2017   Procedure: LEFT HEART CATH AND CORONARY ANGIOGRAPHY;  Surgeon: Burnell Blanks, MD;  Location: [place] [facility];  Service: Cardiovascular;  Laterality: N/A;    Family History  Problem Relation Age of Onset  . Dementia Mother   . Alcoholism Mother   . COPD Father   . Diabetes Sister   . Hypertension Sister   . Cancer Brother        Lung CA  . COPD Brother   . Clotting disorder Brother   . Kidney disease Neg Hx   . Stroke Neg Hx     Social History   Socioeconomic History  . Marital status: Widowed    Spouse name: Not on file  . Number of children: Not on file  . Years of education: Not on file  . Highest education level: Not on file  Occupational History  . Not on file  Social Needs  . Financial resource strain: Not on  file  . Food insecurity:    Worry: Not on file    Inability: Not on file  . Transportation needs:    Medical: Not on file    Non-medical: Not on file  Tobacco Use  . Smoking status: Never Smoker  . Smokeless tobacco: Never Used  Substance and Sexual Activity  . Alcohol use: Yes    Comment: occasional  . Drug use: No  . Sexual activity: Never    Birth control/protection: None  Lifestyle  . Physical activity:    Days per week: Not on file    Minutes per session: Not on file  . Stress: Not on file  Relationships  . Social connections:    Talks on phone: Not on file    Gets together: Not on file    Attends religious service: Not on file    Active member of club or organization: Not on file    Attends meetings of clubs or organizations: Not on file    Relationship status: Not on file  . Intimate partner violence:    Fear of current or ex partner: Not on file    Emotionally abused: Not on file    Physically abused: Not on file  Forced sexual activity: Not on file  Other Topics Concern  . Not on file  Social History Narrative   Current Social History 08/18/2017            Patient lives with daughter and 34 yo granddaughter in one level home 08/18/2017   Transportation: Patient relies on [employer] 08/18/2017   Important Relationships: "Nobody" 08/18/2017    Pets: Dog (Pebbles) cat Willodean Rosenthal) and two hamsters 08/18/2017   Education / Work:  8th grade/ Works around house 08/18/2017   Interests / Fun: TV and crosswords 08/18/2017   Current Stressors: Living situation and food concerns 08/18/2017   L. Ducatte, RN, BSN                                                                                                  Outpatient Medications Prior to Visit  Medication Sig Dispense Refill  . acetaminophen (TYLENOL) 500 MG tablet Take 1,000 mg by mouth every 6 (six) hours as needed for moderate pain.    Marland Kitchen aspirin EC 81 MG tablet Take 81 mg by mouth every morning.     . carbamazepine (TEGRETOL)  200 MG tablet Take 0.5 tablets (100 mg total) by mouth 2 (two) times daily. 90 tablet 3  . cycloSPORINE (RESTASIS) 0.05 % ophthalmic emulsion 1 drop 2 (two) times daily.    Marland Kitchen doxepin (SINEQUAN) 100 MG capsule Take 1 capsule (100 mg total) by mouth at bedtime. For itching and sleep 90 capsule 3  . FLUoxetine (PROZAC) 40 MG capsule Take 1 capsule (40 mg total) by mouth daily. 90 capsule 3  . fluticasone (FLONASE) 50 MCG/ACT nasal spray Place 1 spray into both nostrils 2 (two) times daily. (Patient taking differently: Place 1 spray into both nostrils 2 (two) times daily as needed for allergies. ) 16 g 2  . furosemide (LASIX) 20 MG tablet Take 1 tablet (20 mg total) by mouth 2 (two) times daily. (Patient taking differently: Take 20 mg by mouth daily. ) 180 tablet 3  . loratadine (CLARITIN) 10 MG tablet Take 1 tablet (10 mg total) by mouth daily as needed for allergies. (Patient not taking: Reported on 11/14/2018) 90 tablet 3  . LORazepam (ATIVAN) 0.5 MG tablet Take 0.5 mg by mouth every 8 (eight) hours as needed for anxiety.    . metoprolol tartrate (LOPRESSOR) 25 MG tablet TAKE 1 TABLET BY MOUTH TWO  TIMES DAILY 180 tablet 3  . pantoprazole (PROTONIX) 40 MG tablet TAKE 1 TABLET BY MOUTH  DAILY (Patient taking differently: Take 40 mg by mouth daily. ) 90 tablet 3  . Polyethyl Glycol-Propyl Glycol (SYSTANE) 0.4-0.3 % SOLN Place 1 drop into both eyes daily.    . polyethylene glycol (MIRALAX / GLYCOLAX) packet Take 17 g by mouth daily as needed. (Patient not taking: Reported on 11/14/2018) 14 each 0  . potassium chloride SA (K-DUR,KLOR-CON) 20 MEQ tablet Take 1 tablet (20 mEq total) by mouth daily. (Patient taking differently: Take 20 mEq by mouth 2 (two) times daily. ) 90 tablet 3  . QUEtiapine (SEROQUEL) 50 MG tablet TAKE 1 TABLET BY MOUTH TWICE  A DAY (Patient taking differently: Take 50 mg by mouth at bedtime. ) 180 tablet 3  . Spacer/Aero-Holding Chambers (AEROCHAMBER PLUS) inhaler Use as instructed 1  each 2  . albuterol (PROVENTIL HFA;VENTOLIN HFA) 108 (90 Base) MCG/ACT inhaler Inhale 2 puffs into the lungs every 6 (six) hours as needed for wheezing or shortness of breath.     Marland Kitchen amLODipine (NORVASC) 5 MG tablet Take 1 tablet (5 mg total) by mouth daily. 90 tablet 3  . atorvastatin (LIPITOR) 40 MG tablet TAKE 1 TABLET BY MOUTH  DAILY (Patient taking differently: Take 40 mg by mouth daily. ) 90 tablet 3  . doxycycline (VIBRAMYCIN) 100 MG capsule Take 1 capsule (100 mg total) by mouth 2 (two) times daily. 20 capsule 0  . levothyroxine (SYNTHROID, LEVOTHROID) 50 MCG tablet Take 1 tablet (50 mcg total) daily before breakfast by mouth. 90 tablet 4   No facility-administered medications prior to visit.     Allergies  Allergen Reactions  . Lisinopril Cough  . Codeine Phosphate Itching  . Morphine And Related Itching    Burning sensation and itchiness with IV morphine    ROS Review of Systems    Objective:    Physical Exam  BP (!) 172/94   Pulse 61   Temp 98.6 F (37 C) (Oral)   Wt 208 lb (94.3 kg)   SpO2 98%   BMI 35.70 kg/m  Wt Readings from Last 3 Encounters:  12/15/18 207 lb 6.4 oz (94.1 kg)  12/14/18 208 lb (94.3 kg)  11/14/18 211 lb (95.7 kg)   Elevated BP noted. Lungs clear, no wheeze. Cardiac RRR without m or g Trace peripheral edema  Health Maintenance Due  Topic Date Due  . DEXA SCAN  08/05/2012  . MAMMOGRAM  07/10/2018    There are no preventive care reminders to display for this patient.  Lab Results  Component Value Date   TSH 1.043 01/09/2018   Lab Results  Component Value Date   WBC 8.9 12/14/2018   HGB 11.2 (L) 12/14/2018   HCT 35.5 (L) 12/14/2018   MCV 87.0 12/14/2018   PLT 252 12/14/2018   Lab Results  Component Value Date   NA 143 12/14/2018   K 3.2 (L) 12/14/2018   CO2 27 12/14/2018   GLUCOSE 106 (H) 12/14/2018   BUN 9 12/14/2018   CREATININE 1.09 (H) 12/14/2018   BILITOT 0.7 07/07/2018   ALKPHOS 101 07/07/2018   AST 19  07/07/2018   ALT 18 07/07/2018   PROT 6.3 (L) 07/07/2018   ALBUMIN 3.7 07/07/2018   CALCIUM 8.6 (L) 12/14/2018   ANIONGAP 9 12/14/2018   GFR 67.72 08/07/2016   Lab Results  Component Value Date   CHOL 161 01/09/2018   Lab Results  Component Value Date   HDL 37 (L) 01/09/2018   Lab Results  Component Value Date   LDLCALC 89 01/09/2018   Lab Results  Component Value Date   TRIG 176 (H) 01/09/2018   Lab Results  Component Value Date   CHOLHDL 4.4 01/09/2018   Lab Results  Component Value Date   HGBA1C 5.7 (H) 01/09/2018      Assessment & Plan:   Problem List Items Addressed This Visit    Pure hypercholesterolemia   Relevant Medications   atorvastatin (LIPITOR) 40 MG tablet   amLODipine (NORVASC) 10 MG tablet   Hypothyroidism   Relevant Medications   levothyroxine (SYNTHROID, LEVOTHROID) 50 MCG tablet   Essential hypertension   Relevant Medications  atorvastatin (LIPITOR) 40 MG tablet   amLODipine (NORVASC) 10 MG tablet   Cough   Relevant Orders   DG Chest 2 View   COPD suggested by initial evaluation ([place])   Relevant Medications   albuterol (PROVENTIL HFA;VENTOLIN HFA) 108 (90 Base) MCG/ACT inhaler   Cerebrovascular disease   Relevant Medications   atorvastatin (LIPITOR) 40 MG tablet   amLODipine (NORVASC) 10 MG tablet      Meds ordered this encounter  Medications  . albuterol (PROVENTIL HFA;VENTOLIN HFA) 108 (90 Base) MCG/ACT inhaler    Sig: Inhale 2 puffs into the lungs every 6 (six) hours as needed for wheezing or shortness of breath.    Dispense:  18 g    Refill:  6  . levothyroxine (SYNTHROID, LEVOTHROID) 50 MCG tablet    Sig: Take 1 tablet (50 mcg total) by mouth daily before breakfast.    Dispense:  90 tablet    Refill:  4  . atorvastatin (LIPITOR) 40 MG tablet    Sig: Take 1 tablet (40 mg total) by mouth daily.    Dispense:  90 tablet    Refill:  3  . amLODipine (NORVASC) 10 MG tablet    Sig: Take 1 tablet (10 mg total) by mouth daily.     Dispense:  90 tablet    Refill:  3    Follow-up: No follow-ups on file.    Zenia Resides, MD

## 2018-12-15 NOTE — Assessment & Plan Note (Signed)
Reordered atorvastatin 

## 2018-12-15 NOTE — Assessment & Plan Note (Signed)
Likely post viral cough and no indication for antibiotics unless CXR abnormal.  Xray ordered.

## 2018-12-15 NOTE — Assessment & Plan Note (Signed)
Patient is very anxious.  She does have numerous risk factors including history of TIA, and STEMI.  BP is very well controlled today in the office at 132/74.  No red flags by history or exam. - Likely that Norvasc will continue to improve her blood pressure over the next 1 to 2 days.   - Will not make any changes to her blood pressure regimen in the office today.   - Return precautions were given. -Given the patients significant anxiety we will schedule close follow-up in 1 week for her to be rechecked by physician

## 2018-12-15 NOTE — ED Notes (Signed)
No answer for vitals recheck and room call

## 2018-12-20 ENCOUNTER — Ambulatory Visit: Payer: Medicare Other | Admitting: Student in an Organized Health Care Education/Training Program

## 2019-02-02 ENCOUNTER — Emergency Department (HOSPITAL_COMMUNITY)
Admission: EM | Admit: 2019-02-02 | Discharge: 2019-02-02 | Disposition: A | Discharge status: Home / Self Care | Payer: Medicare Other | Attending: Emergency Medicine | Admitting: Emergency Medicine

## 2019-02-02 ENCOUNTER — Other Ambulatory Visit: Payer: Self-pay

## 2019-02-02 ENCOUNTER — Encounter (HOSPITAL_COMMUNITY): Payer: Self-pay | Admitting: Emergency Medicine

## 2019-02-02 DIAGNOSIS — I10 Essential (primary) hypertension: Secondary | ICD-10-CM | POA: Diagnosis not present

## 2019-02-02 DIAGNOSIS — R112 Nausea with vomiting, unspecified: Secondary | ICD-10-CM | POA: Insufficient documentation

## 2019-02-02 DIAGNOSIS — R197 Diarrhea, unspecified: Secondary | ICD-10-CM | POA: Diagnosis not present

## 2019-02-02 DIAGNOSIS — Z79899 Other long term (current) drug therapy: Secondary | ICD-10-CM | POA: Insufficient documentation

## 2019-02-02 DIAGNOSIS — Z7982 Long term (current) use of aspirin: Secondary | ICD-10-CM | POA: Insufficient documentation

## 2019-02-02 DIAGNOSIS — Z8673 Personal history of transient ischemic attack (TIA), and cerebral infarction without residual deficits: Secondary | ICD-10-CM | POA: Diagnosis not present

## 2019-02-02 DIAGNOSIS — R12 Heartburn: Secondary | ICD-10-CM | POA: Diagnosis not present

## 2019-02-02 LAB — URINALYSIS, ROUTINE W REFLEX MICROSCOPIC
Bilirubin Urine: NEGATIVE
Glucose, UA: NEGATIVE mg/dL
Ketones, ur: NEGATIVE mg/dL
Nitrite: NEGATIVE
Protein, ur: NEGATIVE mg/dL
Specific Gravity, Urine: 1.025 (ref 1.005–1.030)
pH: 5 (ref 5.0–8.0)

## 2019-02-02 LAB — COMPREHENSIVE METABOLIC PANEL
ALT: 17 U/L (ref 0–44)
AST: 15 U/L (ref 15–41)
Albumin: 3.9 g/dL (ref 3.5–5.0)
Alkaline Phosphatase: 115 U/L (ref 38–126)
Anion gap: 5 (ref 5–15)
BUN: 16 mg/dL (ref 8–23)
CO2: 27 mmol/L (ref 22–32)
Calcium: 8.2 mg/dL — ABNORMAL LOW (ref 8.9–10.3)
Chloride: 108 mmol/L (ref 98–111)
Creatinine, Ser: 0.95 mg/dL (ref 0.44–1.00)
GFR calc Af Amer: >60 mL/min (ref >60)
GFR calc non Af Amer: >60 mL/min (ref >60)
Glucose, Bld: 130 mg/dL — ABNORMAL HIGH (ref 70–99)
Potassium: 3.3 mmol/L — ABNORMAL LOW (ref 3.5–5.1)
Sodium: 140 mmol/L (ref 135–145)
Total Bilirubin: 0.1 mg/dL — ABNORMAL LOW (ref 0.3–1.2)
Total Protein: 6.7 g/dL (ref 6.5–8.1)

## 2019-02-02 LAB — CBC
HCT: 38.5 % (ref 36.0–46.0)
Hemoglobin: 11.9 g/dL — ABNORMAL LOW (ref 12.0–15.0)
MCH: 27.1 pg (ref 26.0–34.0)
MCHC: 30.9 g/dL (ref 30.0–36.0)
MCV: 87.7 fL (ref 80.0–100.0)
Platelets: 228 10*3/uL (ref 150–400)
RBC: 4.39 MIL/uL (ref 3.87–5.11)
RDW: 13 % (ref 11.5–15.5)
WBC: 10.7 10*3/uL — ABNORMAL HIGH (ref 4.0–10.5)
nRBC: 0 % (ref 0.0–0.2)

## 2019-02-02 LAB — LIPASE, BLOOD: Lipase: 25 U/L (ref 11–51)

## 2019-02-02 MED ORDER — SODIUM CHLORIDE 0.9% FLUSH
3.0000 mL | Freq: Once | INTRAVENOUS | Status: AC
  Administered 2019-02-02: 3 mL via INTRAVENOUS

## 2019-02-02 MED ORDER — SODIUM CHLORIDE 0.9 % IV BOLUS
500.0000 mL | Freq: Once | INTRAVENOUS | Status: AC
  Administered 2019-02-02: 500 mL via INTRAVENOUS

## 2019-02-02 MED ORDER — ONDANSETRON HCL 4 MG/2ML IJ SOLN
4.0000 mg | Freq: Once | INTRAMUSCULAR | Status: AC
  Administered 2019-02-02: 4 mg via INTRAVENOUS
  Filled 2019-02-02: qty 2

## 2019-02-02 MED ORDER — FAMOTIDINE 20 MG PO TABS
40.0000 mg | ORAL_TABLET | Freq: Once | ORAL | Status: AC
  Administered 2019-02-02: 40 mg via ORAL
  Filled 2019-02-02: qty 2

## 2019-02-02 MED ORDER — ONDANSETRON 4 MG PO TBDP: 4.0000 mg | ORAL_TABLET | Freq: Once | ORAL | Status: DC | PRN

## 2019-02-02 MED ORDER — ONDANSETRON HCL 4 MG PO TABS: 4.0000 mg | ORAL_TABLET | Freq: Four times a day (QID) | ORAL | 0 refills | Status: DC | PRN

## 2019-02-02 MED ORDER — LOPERAMIDE HCL 2 MG PO CAPS: 2.0000 mg | ORAL_CAPSULE | Freq: Four times a day (QID) | ORAL | 0 refills | Status: DC | PRN

## 2019-02-02 MED ORDER — ALUM & MAG HYDROXIDE-SIMETH 200-200-20 MG/5ML PO SUSP
30.0000 mL | Freq: Once | ORAL | Status: AC
  Administered 2019-02-02: 30 mL via ORAL
  Filled 2019-02-02: qty 30

## 2019-02-02 MED ORDER — LOPERAMIDE HCL 2 MG PO CAPS
4.0000 mg | ORAL_CAPSULE | Freq: Once | ORAL | Status: AC
  Administered 2019-02-02: 4 mg via ORAL
  Filled 2019-02-02: qty 2

## 2019-02-02 NOTE — ED Provider Notes (Signed)
[facility] DEPT Provider Note   CSN: [id number] Arrival date & time: 02/02/19  0503    History   Chief Complaint Chief Complaint  Patient presents with  . Emesis  . Generalized Body Aches    HPI Erica Morris is a 73 y.o. female.     Patient presents to the emergency department for evaluation of nausea, vomiting, diarrhea with generalized body aches.  Symptoms began at 3 AM.  Patient not experiencing any significant abdominal pain.  She does have "heartburn".  She has a slight cough, no shortness of breath.  She has not had any fever.  Patient reports that "everyone at home" has had similar symptoms this week.     Past Medical History:  Diagnosis Date  . ABDOMINAL PAIN, RECURRENT 03/29/2008  . ANXIETY 10/07/2007  . Candidiasis of mouth 05/30/2010  . GERD 07/25/2007  . Hernia, hiatal   . HYPERTENSION 07/25/2007  . NSTEMI (non-ST elevated myocardial infarction) ([place]) 10/2017  . Stroke [facility])     Patient Active Problem List   Diagnosis Date Noted  . Wooziness 12/15/2018  . Migraine 09/15/2018  . Polypharmacy 06/10/2018  . Panic attack   . TIA (transient ischemic attack) 01/09/2018  . COPD suggested by initial evaluation ([place]) 01/06/2018  . Cough 10/28/2017  . NSTEMI (non-ST elevated myocardial infarction) ([place]) 10/28/2017  . Vertigo 08/10/2017  . 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 Hypothyroidism 11/17/2016  . Episode of recurrent major depressive disorder ([place])   . History of CVA (cerebrovascular accident)   . Pure hypercholesterolemia 11/06/2016  . Chronic venous insufficiency 11/05/2016  . Peripheral neuropathy 11/05/2016  . Cerebrovascular disease 06/09/2016  . Orthostatic hypotension 06/09/2016  . Hypokalemia 02/20/2015  . History of colonic polyps 06/04/2014  . Chronic constipation 03/29/2014  . Anxiety state 10/07/2007  . Essential  hypertension 07/25/2007  . GERD 07/25/2007    Past Surgical History:  Procedure Laterality Date  . ABDOMINAL HYSTERECTOMY    . BREAST SURGERY     bx  . KNEE ARTHROSCOPY    . LEFT HEART CATH AND CORONARY ANGIOGRAPHY N/A 11/02/2017   Procedure: LEFT HEART CATH AND CORONARY ANGIOGRAPHY;  Surgeon: Burnell Blanks, MD;  Location: [place] [facility];  Service: Cardiovascular;  Laterality: N/A;     OB History   No obstetric history on file.      Home Medications    Prior to Admission medications   Medication Sig Start Date End Date Taking? Authorizing Provider  acetaminophen (TYLENOL) 500 MG tablet Take 1,000 mg by mouth every 6 (six) hours as needed for moderate pain.   Yes [provider]  albuterol (PROVENTIL HFA;VENTOLIN HFA) 108 (90 Base) MCG/ACT inhaler Inhale 2 puffs into the lungs every 6 (six) hours as needed for wheezing or shortness of breath. 12/14/18  Yes Hensel, Jamal Collin, MD  amLODipine (NORVASC) 10 MG tablet Take 1 tablet (10 mg total) by mouth daily. 12/14/18  Yes Zenia Resides, MD  aspirin EC 81 MG tablet Take 81 mg by mouth every morning.    Yes [provider]  atorvastatin (LIPITOR) 40 MG tablet Take 1 tablet (40 mg total) by mouth daily. 12/14/18  Yes Hensel, Jamal Collin, MD  carbamazepine (TEGRETOL) 200 MG tablet Take 0.5 tablets (100 mg total) by mouth 2 (two) times daily. 09/15/18  Yes Hensel, Jamal Collin, MD  cycloSPORINE (RESTASIS) 0.05 % ophthalmic emulsion Place 1 drop  into both eyes 2 (two) times daily.    Yes [provider]  doxepin (SINEQUAN) 100 MG capsule Take 1 capsule (100 mg total) by mouth at bedtime. For itching and sleep 09/15/18  Yes Hensel, Jamal Collin, MD  FLUoxetine (PROZAC) 40 MG capsule Take 1 capsule (40 mg total) by mouth daily. 06/13/18  Yes Hensel, Jamal Collin, MD  fluticasone (FLONASE) 50 MCG/ACT nasal spray Place 1 spray into both nostrils 2 (two) times daily. Patient taking differently: Place 1 spray into both  nostrils 2 (two) times daily as needed for allergies.  11/03/17  Yes Hosie Poisson, MD  furosemide (LASIX) 20 MG tablet Take 1 tablet (20 mg total) by mouth 2 (two) times daily. Patient taking differently: Take 20 mg by mouth daily.  08/02/18  Yes Hensel, Jamal Collin, MD  levothyroxine (SYNTHROID, LEVOTHROID) 50 MCG tablet Take 1 tablet (50 mcg total) by mouth daily before breakfast. 12/14/18  Yes Hensel, Jamal Collin, MD  LORazepam (ATIVAN) 0.5 MG tablet Take 0.5 mg by mouth every 8 (eight) hours as needed for anxiety.   Yes [provider]  metoprolol tartrate (LOPRESSOR) 25 MG tablet TAKE 1 TABLET BY MOUTH TWO  TIMES DAILY Patient taking differently: Take 25 mg by mouth 2 (two) times daily.  12/12/18  Yes Hensel, Jamal Collin, MD  pantoprazole (PROTONIX) 40 MG tablet TAKE 1 TABLET BY MOUTH  DAILY Patient taking differently: Take 40 mg by mouth daily.  12/03/17  Yes Hensel, Jamal Collin, MD  Polyethyl Glycol-Propyl Glycol (SYSTANE) 0.4-0.3 % SOLN Place 1 drop into both eyes daily.   Yes [provider]  potassium chloride SA (K-DUR,KLOR-CON) 20 MEQ tablet Take 1 tablet (20 mEq total) by mouth daily. Patient taking differently: Take 20 mEq by mouth 2 (two) times daily.  09/15/18  Yes Hensel, Jamal Collin, MD  QUEtiapine (SEROQUEL) 50 MG tablet TAKE 1 TABLET BY MOUTH TWICE A DAY Patient taking differently: Take 50 mg by mouth at bedtime.  04/13/18  Yes Hensel, Jamal Collin, MD  loperamide (IMODIUM) 2 MG capsule Take 1 capsule (2 mg total) by mouth 4 (four) times daily as needed for diarrhea or loose stools. 02/02/19   Orpah Greek, MD  ondansetron (ZOFRAN) 4 MG tablet Take 1 tablet (4 mg total) by mouth every 6 (six) hours as needed for nausea or vomiting. 02/02/19   Orpah Greek, MD  polyethylene glycol (MIRALAX / GLYCOLAX) packet Take 17 g by mouth daily as needed. Patient not taking: Reported on 11/14/2018 11/03/17   Hosie Poisson, MD  Spacer/Aero-Holding Chambers (AEROCHAMBER PLUS)  inhaler Use as instructed 10/26/17   Mercy Riding, MD    Family History Family History  Problem Relation Age of Onset  . Dementia Mother   . Alcoholism Mother   . COPD Father   . Diabetes Sister   . Hypertension Sister   . Cancer Brother        Lung CA  . COPD Brother   . Clotting disorder Brother   . Kidney disease Neg Hx   . Stroke Neg Hx     Social History Social History   Tobacco Use  . Smoking status: Never Smoker  . Smokeless tobacco: Never Used  Substance Use Topics  . Alcohol use: Yes    Comment: occasional  . Drug use: No     Allergies   Lisinopril; Codeine phosphate; and Morphine and related   Review of Systems Review of Systems  Gastrointestinal: Positive for diarrhea, nausea and vomiting.  All other systems reviewed and are negative.    Physical Exam Updated Vital Signs BP 135/76   Pulse 69   Temp 98.6 F (37 C) (Oral)   Resp 17   Ht 5\' 4"  (1.626 m)   Wt 95.3 kg   SpO2 98%   BMI 36.05 kg/m   Physical Exam Vitals signs and nursing note reviewed.  Constitutional:      General: She is not in acute distress.    Appearance: Normal appearance. She is well-developed.  HENT:     Head: Normocephalic and atraumatic.     Right Ear: Hearing normal.     Left Ear: Hearing normal.     Nose: Nose normal.  Eyes:     Conjunctiva/sclera: Conjunctivae normal.     Pupils: Pupils are equal, round, and reactive to light.  Neck:     Musculoskeletal: Normal range of motion and neck supple.  Cardiovascular:     Rate and Rhythm: Regular rhythm.     Heart sounds: S1 normal and S2 normal. No murmur. No friction rub. No gallop.   Pulmonary:     Effort: Pulmonary effort is normal. No respiratory distress.     Breath sounds: Normal breath sounds.  Chest:     Chest wall: No tenderness.  Abdominal:     General: Bowel sounds are normal.     Palpations: Abdomen is soft.     Tenderness: There is no abdominal tenderness. There is no guarding or rebound.  Negative signs include Murphy's sign and McBurney's sign.     Hernia: No hernia is present.  Musculoskeletal: Normal range of motion.  Skin:    General: Skin is warm and dry.     Findings: No rash.  Neurological:     Mental Status: She is alert and oriented to person, place, and time.     GCS: GCS eye subscore is 4. GCS verbal subscore is 5. GCS motor subscore is 6.     Cranial Nerves: No cranial nerve deficit.     Sensory: No sensory deficit.     Coordination: Coordination normal.  Psychiatric:        Speech: Speech normal.        Behavior: Behavior normal.        Thought Content: Thought content normal.      ED Treatments / Results  Labs (all labs ordered are listed, but only abnormal results are displayed) Labs Reviewed  COMPREHENSIVE METABOLIC PANEL - Abnormal; Notable for the following components:      Result Value   Potassium 3.3 (*)    Glucose, Bld 130 (*)    Calcium 8.2 (*)    Total Bilirubin 0.1 (*)    All other components within normal limits  CBC - Abnormal; Notable for the following components:   WBC 10.7 (*)    Hemoglobin 11.9 (*)    All other components within normal limits  URINALYSIS, ROUTINE W REFLEX MICROSCOPIC - Abnormal; Notable for the following components:   Hgb urine dipstick SMALL (*)    Leukocytes,Ua SMALL (*)    Bacteria, UA RARE (*)    All other components within normal limits  LIPASE, BLOOD    EKG EKG Interpretation  Date/Time:  Thursday February 02 2019 05:26:02 EST Ventricular Rate:  71 PR Interval:    QRS Duration: 87 QT Interval:  400 QTC Calculation: 435 R Axis:   50 Text Interpretation:  Sinus rhythm Baseline wander in lead(s) V5 Confirmed by Orpah Greek [phone]) on 02/02/2019  5:36:37 AM   Radiology No results found.  Procedures Procedures (including critical care time)  Medications Ordered in ED Medications  sodium chloride flush (NS) 0.9 % injection 3 mL (3 mLs Intravenous Given 02/02/19 0536)  loperamide  (IMODIUM) capsule 4 mg (4 mg Oral Given 02/02/19 0533)  sodium chloride 0.9 % bolus 500 mL (0 mLs Intravenous Stopped 02/02/19 0600)  ondansetron (ZOFRAN) injection 4 mg (4 mg Intravenous Given 02/02/19 0531)  famotidine (PEPCID) tablet 40 mg (40 mg Oral Given 02/02/19 0533)  alum & mag hydroxide-simeth (MAALOX/MYLANTA) 200-200-20 MG/5ML suspension 30 mL (30 mLs Oral Given 02/02/19 0534)     Initial Impression / Assessment and Plan / ED Course  I have reviewed the triage vital signs and the nursing notes.  Pertinent labs & imaging results that were available during my care of the patient were reviewed by me and considered in my medical decision making (see chart for details).        Patient presents to the emergency department for evaluation of nausea, vomiting, diarrhea.  Symptoms began after multiple family members had similar symptoms at the house this week.  Abdominal exam is benign.  No focal tenderness, no guarding or rebound.  Lab work unremarkable.  Patient administered Zofran, Imodium, IV fluids.  She has done well.  Vital signs remain normal.  Recheck reveals continued benign abdominal exam, still no significant tenderness.  With her multiple sick contacts, this is likely infectious, treat symptomatically at home.  Final Clinical Impressions(s) / ED Diagnoses   Final diagnoses:  Nausea vomiting and diarrhea    ED Discharge Orders         Ordered    ondansetron (ZOFRAN) 4 MG tablet  Every 6 hours PRN,   Status:  Discontinued     02/02/19 0729    loperamide (IMODIUM) 2 MG capsule  4 times daily PRN,   Status:  Discontinued     02/02/19 0729    ondansetron (ZOFRAN) 4 MG tablet  Every 6 hours PRN     02/02/19 0730    loperamide (IMODIUM) 2 MG capsule  4 times daily PRN     02/02/19 0730           Orpah Greek, MD 02/02/19 0730

## 2019-02-02 NOTE — ED Notes (Signed)
Pt A&O x4 on discharge and ambulatory to lobby.

## 2019-02-02 NOTE — ED Notes (Signed)
Pt able to ambulate to restroom for urine sample.

## 2019-02-02 NOTE — ED Triage Notes (Signed)
Patient complaining of nausea, vomiting, diarrhea, and body aches. Patient states that she started this two days ago. Patient states she also has heartburn.

## 2019-02-20 ENCOUNTER — Encounter: Payer: Self-pay | Admitting: Gastroenterology

## 2019-02-28 ENCOUNTER — Other Ambulatory Visit: Payer: Self-pay | Admitting: Family Medicine

## 2019-02-28 DIAGNOSIS — F410 Panic disorder [episodic paroxysmal anxiety] without agoraphobia: Secondary | ICD-10-CM

## 2019-04-04 IMAGING — CT CT HEAD W/O CM
3 of 4 series · 14 of 47 positions shown, 16 images · non-contrast
Comparison: CT of the head performed 08/08/2017, and MRI of the
brain performed 03/23/2016

CLINICAL DATA: Acute onset of headache and dizziness.

EXAM:
CT HEAD WITHOUT CONTRAST
TECHNIQUE: Contiguous axial images were obtained from the base of the skull
through the vertex without intravenous contrast.

[Series 2: head w/o · axial · non-contrast · 0.45mm/px · z∈[-173,-48]mm · 8 of 31 slices shown, 10 images]
[im 3/31  brain]
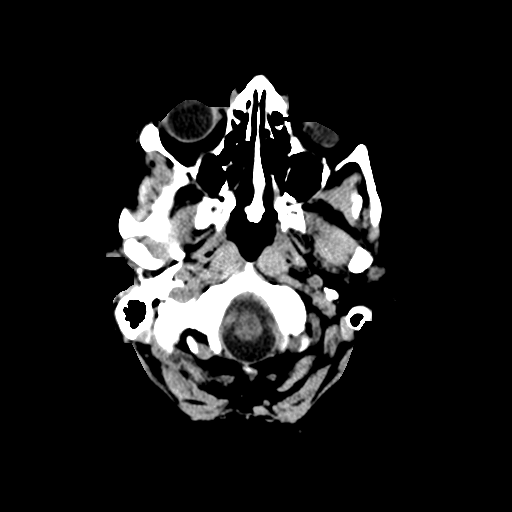
[im 3/31  bone]
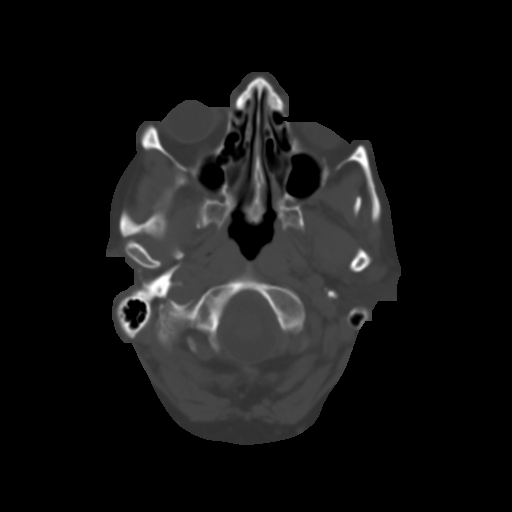
[im 7/31  brain]
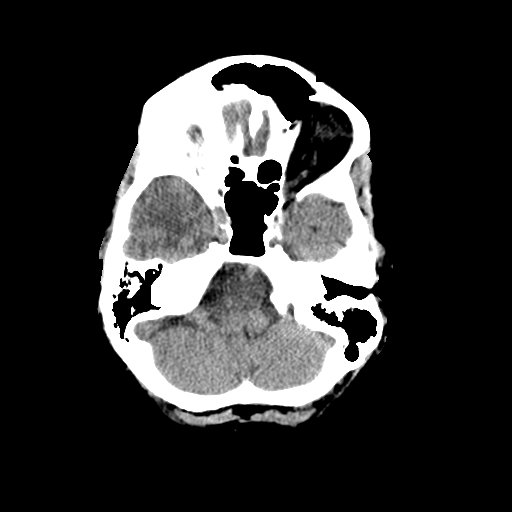
[im 11/31  brain]
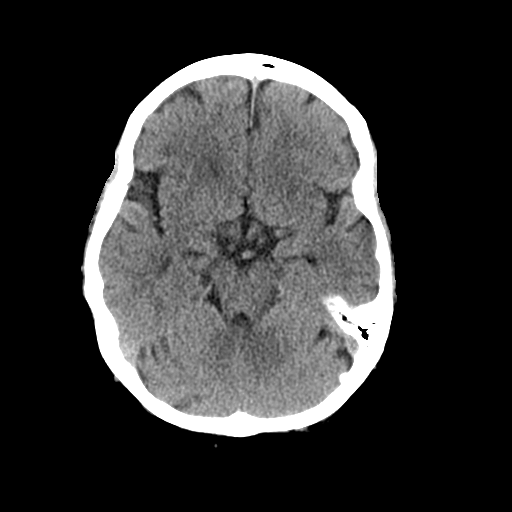
[im 13/31  brain]
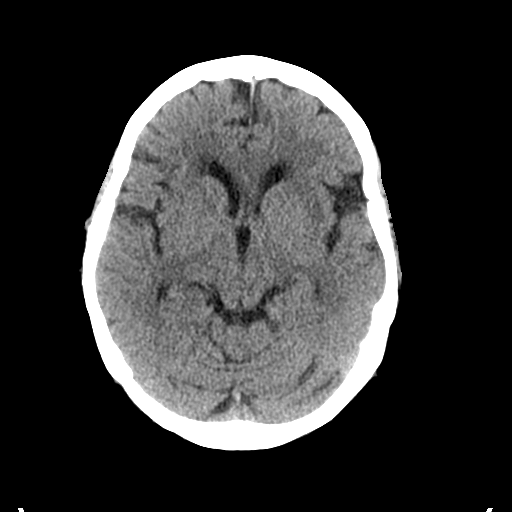
[im 18/31  brain]
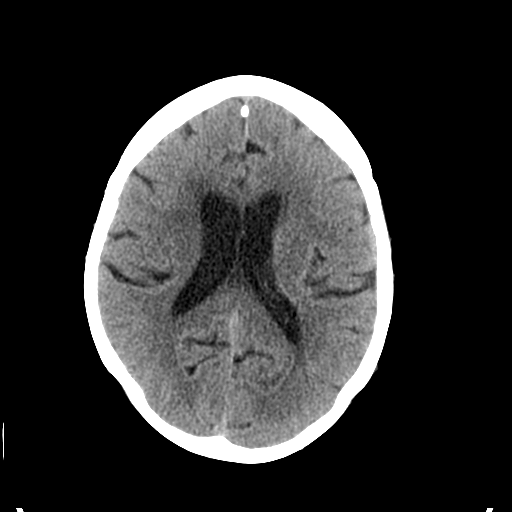
[im 18/31  bone]
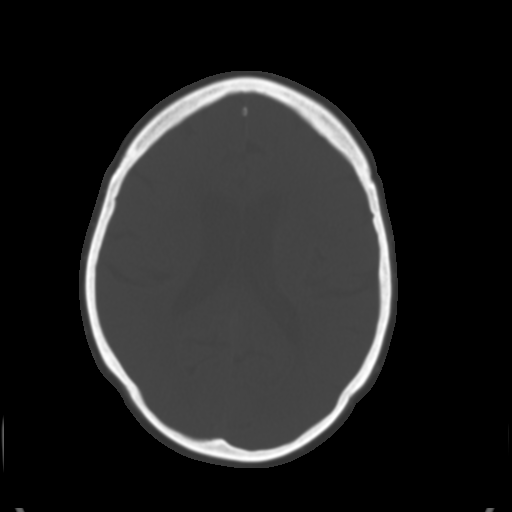
[im 20/31  brain]
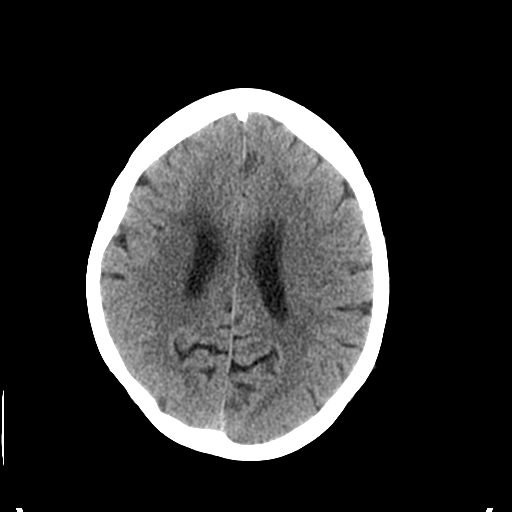
[im 24/31  brain]
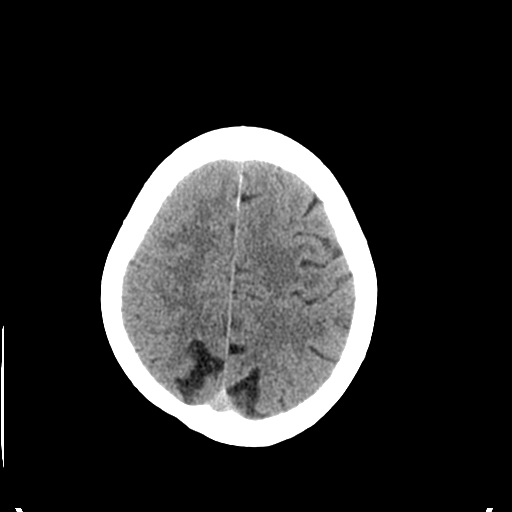
[im 28/31  brain]
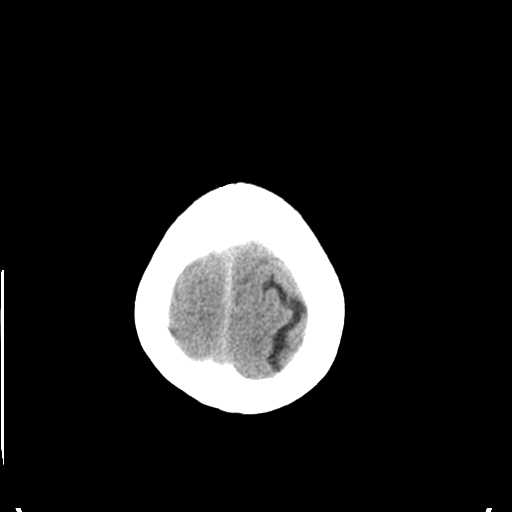

[Series 4: coronal · coronal · 0.30mm/px · 3 of 61 slices shown]
[im 21/61  brain]
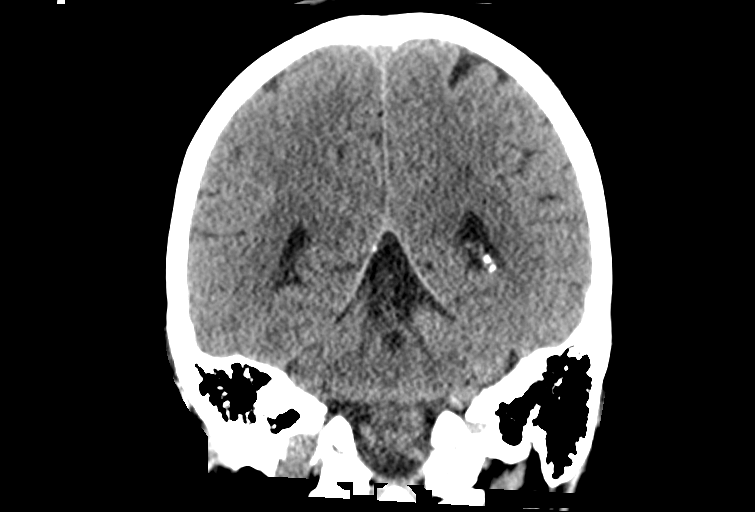
[im 27/61  brain]
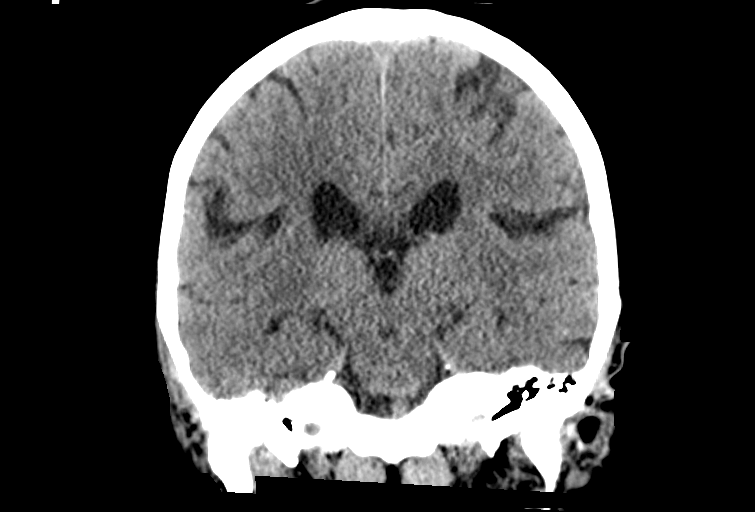
[im 34/61  brain]
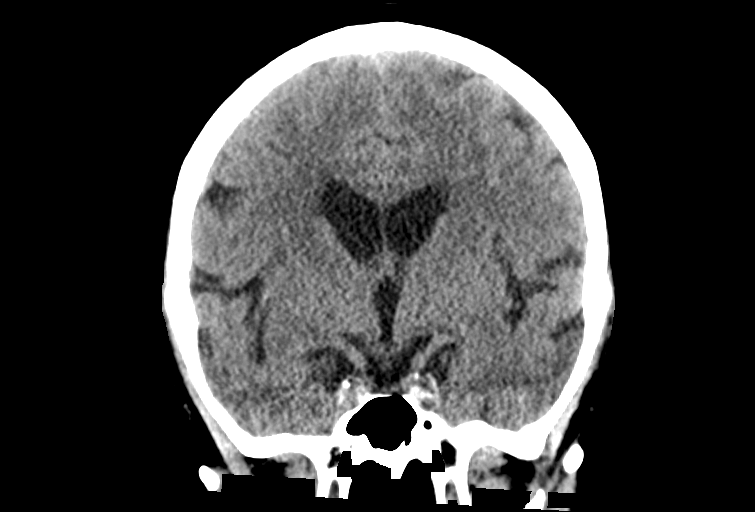

[Series 5: sagittal · sagittal · 0.31mm/px · 3 of 48 slices shown]
[im 16/48  brain]
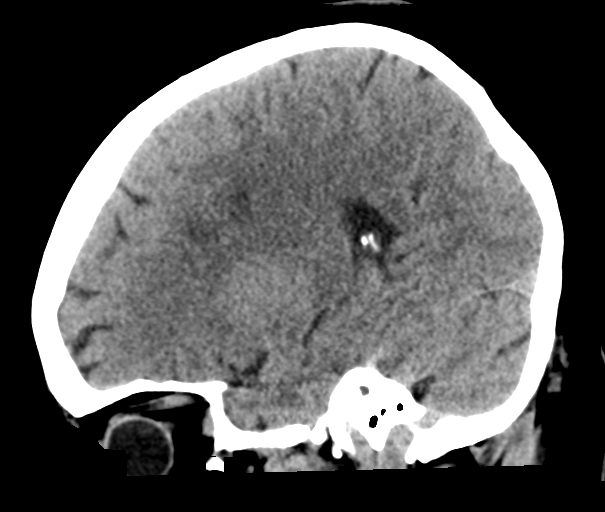
[im 24/48  brain]
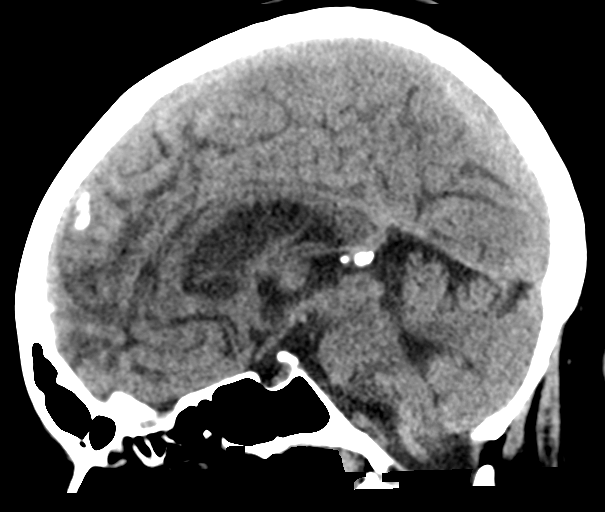
[im 32/48  brain]
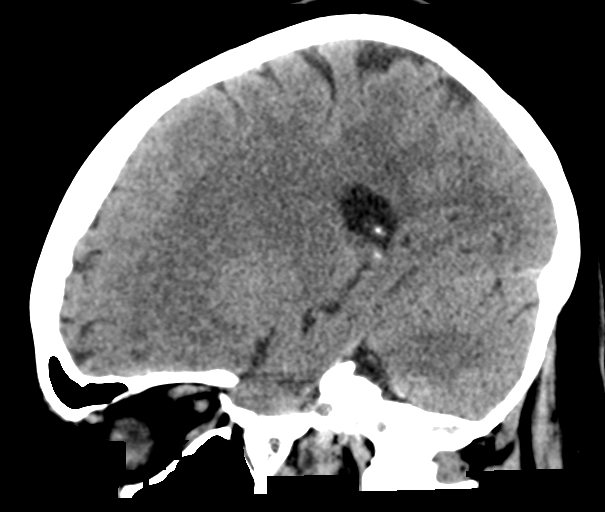

[14 of 47 positions shown; findings below may reference images not displayed]

FINDINGS: Brain: No evidence of acute infarction, hemorrhage, hydrocephalus,
extra-axial collection or mass lesion/mass effect.

Scattered periventricular and subcortical white matter change likely
reflects small vessel ischemic microangiopathy.

The posterior fossa, including the cerebellum, brainstem and fourth
ventricle, is within normal limits. The third and lateral
ventricles, and basal ganglia are unremarkable in appearance. The
cerebral hemispheres are symmetric in appearance, with normal
gray-white differentiation. No mass effect or midline shift is seen.

Vascular: No hyperdense vessel or unexpected calcification.

Skull: There is no evidence of fracture; visualized osseous
structures are unremarkable in appearance.

Sinuses/Orbits: The visualized portions of the orbits are within
normal limits. The paranasal sinuses and mastoid air cells are
well-aerated.

Other: No significant soft tissue abnormalities are seen.
IMPRESSION: 1. No acute intracranial pathology seen on CT.
2. Scattered small vessel ischemic microangiopathy.

## 2019-04-06 ENCOUNTER — Encounter: Payer: Self-pay | Admitting: Gastroenterology

## 2019-07-04 ENCOUNTER — Telehealth: Payer: Self-pay

## 2019-07-04 DIAGNOSIS — F339 Major depressive disorder, recurrent, unspecified: Secondary | ICD-10-CM

## 2019-07-04 MED ORDER — QUETIAPINE FUMARATE 50 MG PO TABS: 50.0000 mg | ORAL_TABLET | Freq: Two times a day (BID) | ORAL | 0 refills | Status: DC

## 2019-07-07 MED ORDER — QUETIAPINE FUMARATE 50 MG PO TABS: 50.0000 mg | ORAL_TABLET | Freq: Two times a day (BID) | ORAL | 0 refills | Status: DC

## 2019-07-07 NOTE — Telephone Encounter (Signed)
Script needed to be sent to [facility].  Daughter in law, Lenna Sciara will call optum to cancel.   Resent to [facility] . Christen Bame, CMA

## 2019-07-07 NOTE — Telephone Encounter (Signed)
Noted and agree. 

## 2019-08-22 ENCOUNTER — Other Ambulatory Visit: Payer: Self-pay | Admitting: Family Medicine

## 2019-08-22 DIAGNOSIS — F339 Major depressive disorder, recurrent, unspecified: Secondary | ICD-10-CM

## 2019-08-23 ENCOUNTER — Ambulatory Visit: Payer: Medicare Other | Admitting: Family Medicine

## 2019-09-22 ENCOUNTER — Other Ambulatory Visit: Payer: Self-pay | Admitting: Family Medicine

## 2019-09-22 DIAGNOSIS — F410 Panic disorder [episodic paroxysmal anxiety] without agoraphobia: Secondary | ICD-10-CM

## 2019-10-12 ENCOUNTER — Ambulatory Visit (INDEPENDENT_AMBULATORY_CARE_PROVIDER_SITE_OTHER): Payer: Medicare Other | Admitting: Family Medicine

## 2019-10-12 ENCOUNTER — Other Ambulatory Visit: Payer: Self-pay

## 2019-10-12 ENCOUNTER — Encounter: Payer: Self-pay | Admitting: Family Medicine

## 2019-10-12 DIAGNOSIS — J449 Chronic obstructive pulmonary disease, unspecified: Secondary | ICD-10-CM

## 2019-10-12 DIAGNOSIS — I679 Cerebrovascular disease, unspecified: Secondary | ICD-10-CM | POA: Diagnosis not present

## 2019-10-12 DIAGNOSIS — G43009 Migraine without aura, not intractable, without status migrainosus: Secondary | ICD-10-CM

## 2019-10-12 DIAGNOSIS — F339 Major depressive disorder, recurrent, unspecified: Secondary | ICD-10-CM

## 2019-10-12 DIAGNOSIS — F411 Generalized anxiety disorder: Secondary | ICD-10-CM

## 2019-10-12 DIAGNOSIS — E7800 Pure hypercholesterolemia, unspecified: Secondary | ICD-10-CM | POA: Diagnosis not present

## 2019-10-12 DIAGNOSIS — I10 Essential (primary) hypertension: Secondary | ICD-10-CM | POA: Diagnosis not present

## 2019-10-12 MED ORDER — ATORVASTATIN CALCIUM 40 MG PO TABS: 40.0000 mg | ORAL_TABLET | Freq: Every day | ORAL | 3 refills | Status: DC

## 2019-10-12 MED ORDER — AMLODIPINE BESYLATE 10 MG PO TABS: 10.0000 mg | ORAL_TABLET | Freq: Every day | ORAL | 3 refills | Status: DC

## 2019-10-12 MED ORDER — QUETIAPINE FUMARATE 50 MG PO TABS: 50.0000 mg | ORAL_TABLET | Freq: Two times a day (BID) | ORAL | 1 refills | Status: DC

## 2019-10-12 MED ORDER — CARBAMAZEPINE 200 MG PO TABS: 100.0000 mg | ORAL_TABLET | Freq: Two times a day (BID) | ORAL | 3 refills | Status: DC

## 2019-10-12 MED ORDER — LOSARTAN POTASSIUM 50 MG PO TABS: 50.0000 mg | ORAL_TABLET | Freq: Every day | ORAL | 3 refills | Status: DC

## 2019-10-12 MED ORDER — LORAZEPAM 0.5 MG PO TABS: 0.5000 mg | ORAL_TABLET | Freq: Every day | ORAL | 1 refills | Status: DC

## 2019-10-12 NOTE — Assessment & Plan Note (Signed)
Poor control.  Add ARB (cough with ACE)  Recheck 2-3 weeks.
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
## 2019-10-12 NOTE — Assessment & Plan Note (Signed)
Stable on ASA and statin.  Need better BP control

## 2019-10-12 NOTE — Assessment & Plan Note (Signed)
Stable to worse.  Restart lorazepam at night.

## 2019-10-12 NOTE — Patient Instructions (Signed)
I refilled all your medicines including the nighttime nerve pill. I added another blood pressure medicine. See me in 2-3 weeks to make sure your blood pressure is coming down. I am sorry about your broken heart.

## 2019-10-12 NOTE — Assessment & Plan Note (Signed)
Stable treated by prn albuterol

## 2019-10-12 NOTE — Assessment & Plan Note (Signed)
>>  ASSESSMENT AND PLAN FOR COPD SUGGESTED BY INITIAL EVALUATION ([facility]CC) WRITTEN ON 10/12/2019  3:36 PM BY HENSEL, Santiago Bumpers, MD  Stable treated by prn albuterol

## 2019-11-02 ENCOUNTER — Other Ambulatory Visit: Payer: Self-pay | Admitting: Family Medicine

## 2019-11-08 ENCOUNTER — Other Ambulatory Visit: Payer: Self-pay

## 2019-11-08 ENCOUNTER — Encounter: Payer: Self-pay | Admitting: Family Medicine

## 2019-11-08 ENCOUNTER — Ambulatory Visit (INDEPENDENT_AMBULATORY_CARE_PROVIDER_SITE_OTHER): Payer: Medicare Other | Admitting: Family Medicine

## 2019-11-08 DIAGNOSIS — I6990 Unspecified sequelae of unspecified cerebrovascular disease: Secondary | ICD-10-CM

## 2019-11-08 DIAGNOSIS — I10 Essential (primary) hypertension: Secondary | ICD-10-CM | POA: Diagnosis not present

## 2019-11-08 DIAGNOSIS — F411 Generalized anxiety disorder: Secondary | ICD-10-CM

## 2019-11-08 DIAGNOSIS — F339 Major depressive disorder, recurrent, unspecified: Secondary | ICD-10-CM

## 2019-11-08 DIAGNOSIS — R739 Hyperglycemia, unspecified: Secondary | ICD-10-CM | POA: Diagnosis not present

## 2019-11-08 DIAGNOSIS — E7800 Pure hypercholesterolemia, unspecified: Secondary | ICD-10-CM

## 2019-11-08 DIAGNOSIS — E039 Hypothyroidism, unspecified: Secondary | ICD-10-CM

## 2019-11-08 LAB — POCT GLYCOSYLATED HEMOGLOBIN (HGB A1C): Hemoglobin A1C: 5.8 % — AB (ref 4.0–5.6)

## 2019-11-08 MED ORDER — LOSARTAN POTASSIUM 50 MG PO TABS: 50.0000 mg | ORAL_TABLET | Freq: Every day | ORAL | 3 refills | Status: DC

## 2019-11-08 MED ORDER — QUETIAPINE FUMARATE 50 MG PO TABS: ORAL_TABLET | ORAL | 1 refills | Status: DC

## 2019-11-08 NOTE — Patient Instructions (Addendum)
Stop the lorazepam since it is not helping. Refill the losartan.  It is helping your blood pressure and will not cause the appetite problem For your nerves, start taking the Seroquel 1 tab in the morning and two tabs night.   I will call Olivia Mackie with the results of the blood work.

## 2019-11-09 LAB — CMP14+EGFR
ALT: 22 IU/L (ref 0–32)
AST: 22 IU/L (ref 0–40)
Albumin/Globulin Ratio: 2.2 (ref 1.2–2.2)
Albumin: 4.3 g/dL (ref 3.7–4.7)
Alkaline Phosphatase: 168 IU/L — ABNORMAL HIGH (ref 39–117)
BUN/Creatinine Ratio: 13 (ref 12–28)
BUN: 15 mg/dL (ref 8–27)
Bilirubin Total: 0.2 mg/dL (ref 0.0–1.2)
CO2: 26 mmol/L (ref 20–29)
Calcium: 8.9 mg/dL (ref 8.7–10.3)
Chloride: 102 mmol/L (ref 96–106)
Creatinine, Ser: 1.12 mg/dL — ABNORMAL HIGH (ref 0.57–1.00)
GFR calc Af Amer: 57 mL/min/{1.73_m2} — ABNORMAL LOW (ref >59)
GFR calc non Af Amer: 49 mL/min/{1.73_m2} — ABNORMAL LOW (ref >59)
Globulin, Total: 2 g/dL (ref 1.5–4.5)
Glucose: 91 mg/dL (ref 65–99)
Potassium: 3.7 mmol/L (ref 3.5–5.2)
Sodium: 142 mmol/L (ref 134–144)
Total Protein: 6.3 g/dL (ref 6.0–8.5)

## 2019-11-09 LAB — TSH: TSH: 2.85 u[IU]/mL (ref 0.450–4.500)

## 2019-11-09 LAB — LIPID PANEL
Chol/HDL Ratio: 3.4 ratio (ref 0.0–4.4)
Cholesterol, Total: 154 mg/dL (ref 100–199)
HDL: 45 mg/dL (ref >39)
LDL Chol Calc (NIH): 86 mg/dL (ref 0–99)
Triglycerides: 131 mg/dL (ref 0–149)
VLDL Cholesterol Cal: 23 mg/dL (ref 5–40)

## 2019-11-09 MED ORDER — ATORVASTATIN CALCIUM 80 MG PO TABS: 80.0000 mg | ORAL_TABLET | Freq: Every day | ORAL | 3 refills | Status: DC

## 2019-11-09 NOTE — Assessment & Plan Note (Addendum)
LDL still above 70.  Will increase atorvastatin to 80 mg daily.  Called and LM.  New Rx sent.

## 2019-11-11 ENCOUNTER — Encounter: Payer: Self-pay | Admitting: Family Medicine

## 2019-11-11 NOTE — Assessment & Plan Note (Signed)
Check TSH 

## 2019-11-11 NOTE — Assessment & Plan Note (Signed)
LDL not at goal  Increase lipitor to 80 mg daily.

## 2019-11-11 NOTE — Assessment & Plan Note (Signed)
Encouraged to continue losartan.  It should not effect appetite.

## 2019-11-11 NOTE — Assessment & Plan Note (Signed)
DC lorazepam.  Increase seroquel

## 2019-11-11 NOTE — Progress Notes (Signed)
   Subjective:    Patient ID: Erica Morris, female    DOB: 12/04/1945, 73 y.o.   MRN: [id number]  HPI  Multiple complaints.  Anxiety - did not loke the lorazepam and stopped on own.  "I need something for my nerves. 2. Stopped losartan on her own  "It makes me so that I cannot eat."   3. Dizzy, "drunk"  A longstanding complaint that I have not been able to diagnose or treat. No recent falls.   Review of Systems     Objective:   Physical Exam VS noted. Lungs clear, Cardiac RRR without m or g Abd benign.    Wants me to call daughter Olivia Mackie [phone] with blood test results.        Assessment & Plan:

## 2019-11-11 NOTE — Assessment & Plan Note (Signed)
Moderate and stable.  DC lorazepam and increase seroquel.

## 2019-12-04 ENCOUNTER — Other Ambulatory Visit: Payer: Self-pay | Admitting: Family Medicine

## 2019-12-04 DIAGNOSIS — F339 Major depressive disorder, recurrent, unspecified: Secondary | ICD-10-CM

## 2019-12-06 ENCOUNTER — Other Ambulatory Visit: Payer: Self-pay | Admitting: *Deleted

## 2019-12-06 DIAGNOSIS — I5032 Chronic diastolic (congestive) heart failure: Secondary | ICD-10-CM

## 2019-12-06 DIAGNOSIS — I50810 Right heart failure, unspecified: Secondary | ICD-10-CM

## 2019-12-06 MED ORDER — FUROSEMIDE 20 MG PO TABS: 20.0000 mg | ORAL_TABLET | Freq: Two times a day (BID) | ORAL | 3 refills | Status: DC

## 2019-12-26 ENCOUNTER — Other Ambulatory Visit: Payer: Self-pay

## 2019-12-26 DIAGNOSIS — E039 Hypothyroidism, unspecified: Secondary | ICD-10-CM

## 2019-12-26 MED ORDER — LEVOTHYROXINE SODIUM 50 MCG PO TABS: 50.0000 ug | ORAL_TABLET | Freq: Every day | ORAL | 4 refills | Status: DC

## 2019-12-26 NOTE — Telephone Encounter (Signed)
Pt request Rx be sent to [facility] [address] rather than [facility]. Ottis Stain, CMA

## 2019-12-28 ENCOUNTER — Telehealth (INDEPENDENT_AMBULATORY_CARE_PROVIDER_SITE_OTHER): Payer: Medicare Other | Admitting: Family Medicine

## 2019-12-28 ENCOUNTER — Other Ambulatory Visit: Payer: Self-pay

## 2019-12-28 NOTE — Progress Notes (Signed)
Patient called on 2 different numbers and no pick up either time, did leave message on first number to call me back and that I would try again.  No VM option on second try.  -Dr. Criss Rosales

## 2020-01-15 ENCOUNTER — Telehealth: Payer: Self-pay | Admitting: Family Medicine

## 2020-01-15 NOTE — Telephone Encounter (Signed)
Pt is calling stating she is hurt from her eyes down to her mouth and she is not able to eat due to mouth hurting. Please advise.  [place] Silos

## 2020-01-15 NOTE — Telephone Encounter (Signed)
Please have patient make an in-person appointment at next available (likely not me since I will be inpatient attending.)  Cannot diagnose over the phone.

## 2020-01-30 ENCOUNTER — Telehealth: Payer: Self-pay | Admitting: *Deleted

## 2020-01-30 DIAGNOSIS — J449 Chronic obstructive pulmonary disease, unspecified: Secondary | ICD-10-CM

## 2020-01-30 NOTE — Telephone Encounter (Signed)
Received fax requesting new Rx for PROAIR to be sent to [facility] [address], did not see this on current med list.  Stated pt is changing from the [place] store to them.April Zimmerman Rumple, CMA

## 2020-01-31 MED ORDER — ALBUTEROL SULFATE HFA 108 (90 BASE) MCG/ACT IN AERS: 2.0000 | INHALATION_SPRAY | Freq: Four times a day (QID) | RESPIRATORY_TRACT | 6 refills | Status: DC | PRN

## 2020-01-31 NOTE — Telephone Encounter (Signed)
Refilled albuterol MDI as requested.

## 2020-02-05 NOTE — Progress Notes (Signed)
[place] Telemedicine Visit  Patient consented to have virtual visit. Method of visit: Telephone  Encounter participants: Patient: Erica Morris - located at [phone] Provider: Daisy Floro - located at [facility] Others (if applicable): None  Chief Complaint: Sore throat  HPI:  Sore throat: Patient reports a sore throat has been going on for 2-3 weeks, that hurts nowhere that "makes her lips start moving".  She says the right side of her throat is sore with swallowing.  She says the pain is "way down in there" it does not feel swollen.  She says she cannot see anything unusual with her throat.  But she says the soreness is going up from her throat into her face.  She denies the pain involves a tooth because "I have no teeth my mouth".   She states her jaw feels as if she has been chewing a lot.  So far she has tried taking sinus medicine and Tylenol to make it feel better.  She reports itchy eyes, itchy ears, dry cough, pain with swallowing, and pain/pressure in her cheeks.  Denies runny nose, stuffy nose, postnasal drip.  She also denies sick contacts.  No one else around her is having a similar issue.  ROS: per HPI  Pertinent PMHx:  CHF COPD OA of cervical spine with radiculopathy HLD Anxiety HTN CVA Hypothyroidism  Exam:  Respiratory: speaking in full sentences, comfortable work of breathing, no coughing appreciated during conversation  Assessment/Plan: Sore throat Most likely due to viral upper respiratory infection, allergies with seasonal changes, possibly due to GERD.  - Tylenol 500mg  ever 6 hours  - Honey for cough TID  - Flonase - Protonix - Follow up in a couple days if not better   Time spent during visit with patient: 14 minutes   Milus Banister, [place], PGY-2 02/06/2020 5:50 PM

## 2020-02-06 ENCOUNTER — Telehealth (INDEPENDENT_AMBULATORY_CARE_PROVIDER_SITE_OTHER): Payer: Medicare Other | Admitting: Family Medicine

## 2020-02-06 ENCOUNTER — Other Ambulatory Visit: Payer: Self-pay

## 2020-02-06 DIAGNOSIS — J029 Acute pharyngitis, unspecified: Secondary | ICD-10-CM

## 2020-02-06 MED ORDER — FLUTICASONE PROPIONATE 50 MCG/ACT NA SUSP: 2.0000 | Freq: Every day | NASAL | 6 refills | Status: DC

## 2020-02-06 NOTE — Assessment & Plan Note (Addendum)
Most likely due to viral upper respiratory infection, allergies with seasonal changes, possibly due to GERD.  - Tylenol 500mg  ever 6 hours  - Honey for cough TID  - Flonase - Protonix - Follow up in a couple days if not better

## 2020-02-07 ENCOUNTER — Telehealth: Payer: Self-pay | Admitting: Family Medicine

## 2020-02-07 NOTE — Telephone Encounter (Signed)
Pt is calling saying she is needing to be seen for jaw pain going to throat. She just had appt on the 9th, which was yesterday, She's calling today to make appt for tomorrow to come in office and I just wanted someone to call her to see wether it can be in office or if she has to have Virtual. Please advise Thanks.

## 2020-02-08 ENCOUNTER — Other Ambulatory Visit: Payer: Self-pay

## 2020-02-08 ENCOUNTER — Telehealth (INDEPENDENT_AMBULATORY_CARE_PROVIDER_SITE_OTHER): Payer: Medicare Other | Admitting: Family Medicine

## 2020-02-08 DIAGNOSIS — J029 Acute pharyngitis, unspecified: Secondary | ICD-10-CM

## 2020-02-08 DIAGNOSIS — G255 Other chorea: Secondary | ICD-10-CM

## 2020-02-08 NOTE — Telephone Encounter (Signed)
I am the PCP and was the video preceptor for this patient when Dr. Ouida Sills saw her on 3/9.  This is a complex patient and will need to be seen if she has continued symptoms.  I believe she is low risk for COVID and that the benefits of seeing her face-to-face outweigh the risks.   It would be ideal if she could be seen tomorrow morning while I am preceptor so that I can be available to help the resident with this challenging patient. [place]

## 2020-02-08 NOTE — Progress Notes (Signed)
[place] Telemedicine Visit  Patient consented to have virtual visit. Method of visit: Telephone  Encounter participants: Patient: Erica Morris - located at home Provider: Danna Hefty - located at [facility] Others (if applicable): none  Chief Complaint: Right sided sore throat, right facial pain  HPI: Patient following up for sore throat and right facial pain x 2-3 weeks. She notes the right side of her throat is sore when she swallows. She notes the sore throat is really far down. She also notes that her "lips move randomly on there own". Also notes facial pain on the right. She has tried tylenol, honey, protonix, and flonase without any improvement. Notes difficulty swallowing both food and water x 2-3 weeks ago. She does notes she is able to stay hydrated, just has to drink it slow because of the difficulty swallowing. She notes the whole side of her right side of her face is sore and her jaw is sore. Endorses runny nose and occasional wheeze. Denies any fevers, chills. Denies every smoking cigarettes. Denies any difficulty breathing. Notes her eyes itch and ears hurt.   ROS: per HPI  Pertinent PMHx: NSTEMI, TIA/stroke  Exam:  Respiratory: Speaking in full sentences Neuro: No slurred speech  HEENT: Patient denies any neck swelling but it is painful to touch, denies any pain to palpation of right sinus, denies any rashes on face   Assessment/Plan:  Sore throat Endorses unilateral throat pain with difficulty swallowing solids and liquids. No trouble breathing and no signs of respiratory distress during encounter. No improvement with OTC Tylenol, honey, protonix, and flonase. She has long history of cigarette smoking. Unclear etiology at this time. Patient scheduled for in-person visit on 3/12 for further evaluation. Case discussed with Dr. Andria Frames who agreed to plan.   Muscle, jerky movements (uncontrolled) Acute. Uncontrolled lip smacking that is new for  patient. She is on chronic antipsychotics. Concern for tardive dyskinesia. Will defer to PCP.    Time spent during visit with patient: 25 minutes  Mina Marble, [place], PGY2 02/08/20

## 2020-02-09 ENCOUNTER — Other Ambulatory Visit: Payer: Self-pay

## 2020-02-09 ENCOUNTER — Encounter: Payer: Self-pay | Admitting: Family Medicine

## 2020-02-09 ENCOUNTER — Ambulatory Visit (INDEPENDENT_AMBULATORY_CARE_PROVIDER_SITE_OTHER): Payer: Medicare Other | Admitting: Family Medicine

## 2020-02-09 VITALS — BP 148/80 | HR 53 | Ht 64.0 in | Wt 210.2 lb

## 2020-02-09 DIAGNOSIS — G255 Other chorea: Secondary | ICD-10-CM | POA: Diagnosis not present

## 2020-02-09 DIAGNOSIS — I889 Nonspecific lymphadenitis, unspecified: Secondary | ICD-10-CM

## 2020-02-09 DIAGNOSIS — J029 Acute pharyngitis, unspecified: Secondary | ICD-10-CM

## 2020-02-09 MED ORDER — CEPHALEXIN 500 MG PO CAPS: 500.0000 mg | ORAL_CAPSULE | Freq: Two times a day (BID) | ORAL | 0 refills | Status: AC

## 2020-02-09 NOTE — Patient Instructions (Signed)
Thank you for coming to see me today. It was a pleasure to see you.   I believe the neck pain is from cervical lymphadenopathy which is inflammation of one of your lymph nodes in your neck.  This is likely from a virus, however since it has been happening for so long we will treat for possible bacterial infection.  I have sent in a prescription of an antibiotic.  Please take this twice a day for 10 days.  Please see Dr. Andria Frames for follow-up appointment in 2 weeks.  Please follow-up with Dr. Andria Frames in 2 weeks.  If you have any questions or concerns, please do not hesitate to call the office at [phone].  Take Care,   Dr. Mina Marble, DO Resident Physician [place] [phone]

## 2020-02-09 NOTE — Progress Notes (Signed)
Subjective:   Patient ID: CARRINA ZINMAN    DOB: June 20, 1946, 74 y.o. female   MRN: [id number]  PERSEIS BARCLAY is a 74 y.o. female with a history of NSTEMI, TIA/Stroke, CHF with right heart failure chronic, chronic venous insufficiency, essential hypertension, migraine, orthostatic hypotension, GERD, hypothyroidism, osteoarthritis, peripheral neuropathy, COPD, anxiety, vertigo here for follow-up of sore throat.  Sore Throat:  Patient here today to follow-up from persistent deep right-sided sore throat x 2-3 weeks. She was evaluated by telemedicine on 3/9 and and 3/11.  Was initially thought to have viral upper respiratory infection versus seasonal allergies versus GERD, however has had minimal improvement with honey, Flonase, Protonix.  Is here today for further evaluation.  Denies any fever/chills.  She does endorse difficulty swallowing both solids and liquids that started about the same time.  She notes she is able to stay hydrated just has to do it slowly.  She also endorses runny nose, occasional wheeze, itchy eyes, and ear fullness.  Patient does not have any smoking history.  Denies any difficulty breathing.  Uncontrolled lip movement: Patient also endorses that her " lips move randomly on their own, like that are smacking".  This has been happening for about 2 to 3 weeks.  Patient is on quetiapine for MDD.   Cervical lymphadenitis  Review of Systems:  Per HPI.   [place], medications and smoking status reviewed.  Objective:   BP (!) 148/80   Pulse (!) 53   Ht 5\' 4"  (1.626 m)   Wt 210 lb 3.2 oz (95.3 kg)   SpO2 97%   BMI 36.08 kg/m  Vitals and nursing note reviewed.  General: well nourished, well developed, in no acute distress with non-toxic appearance HEENT:   Head: normocephalic, atraumatic,   Mouth: moist mucous membranes, oropharynx clear without erythema or exudate, no tonsillar swelling, uvula midline, no sublingual swelling or enlargement, no sublinqual nodules, right  superior cervical LN appreciated on sublingual palpation   Ears: TM normal bilaterally, some cerumen present bilaterally  Eyes: EOMI, PERRL  Neck: supple, significant tender anterior superiorcervical lymph node at level of right angle of mandible, no significant neck swelling, normal ROM  Resp: Breathing comfortably on room air, speaking in full sentences Skin: warm, dry Extremities: warm and well perfused [id number] normal Neuro: Alert and oriented, speech normal, CN II-XII intact, UE strength 5/5 bilaterally, neurovascularly intact, no facial droop, no slurred speech  Assessment & Plan:   Muscle, jerky movements (uncontrolled) 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 Discussed with her PCP, Dr. Andria Frames who will plan to further discuss and evaluate at follow-up visit.  Will defer to PCP for further medication adjustments.  Sore throat Physical exam appears most consistent with right cervical lymphadenitis. Uncertain prognosis. No signs of salivary gland inflammation or sialolithiasis.  At this time we will treat for bacterial lymphadenitis given duration of symptoms and lack of improvement with over-the-counter treatment.  We will have patient follow-up in 2 weeks.  If no improvement will consider imaging versus ENT referral at that time.  Patient agreed and voiced understanding to plan.  - Keflex 500mg  BID x 10 days  - RTC in 2 weeks with PCP  - Discuss return precautions  No orders of the defined types were placed in this encounter.  Meds ordered this encounter  Medications  . cephALEXin (KEFLEX) 500 MG capsule    Sig: Take 1 capsule (500 mg total) by mouth 2 (  two) times daily for 10 days.    Dispense:  20 capsule    Refill:  0    Mina Marble, DO PGY-2, [facility] 02/11/2020 10:28 PM

## 2020-02-11 DIAGNOSIS — G255 Other chorea: Secondary | ICD-10-CM | POA: Insufficient documentation

## 2020-02-11 NOTE — Assessment & Plan Note (Signed)
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 Discussed with her PCP, Dr. Andria Frames who will plan to further discuss and evaluate at follow-up visit.  Will defer to PCP for further medication adjustments.

## 2020-02-11 NOTE — Assessment & Plan Note (Addendum)
Physical exam appears most consistent with right cervical lymphadenitis. Uncertain prognosis. No signs of salivary gland inflammation or sialolithiasis.  At this time we will treat for bacterial lymphadenitis given duration of symptoms and lack of improvement with over-the-counter treatment.  We will have patient follow-up in 2 weeks.  If no improvement will consider imaging versus ENT referral at that time.  Patient agreed and voiced understanding to plan.  - Keflex 500mg  BID x 10 days  - RTC in 2 weeks with PCP  - Discuss return precautions

## 2020-02-11 NOTE — Assessment & Plan Note (Signed)
Endorses unilateral throat pain with difficulty swallowing solids and liquids. No trouble breathing and no signs of respiratory distress during encounter. No improvement with OTC Tylenol, honey, protonix, and flonase. She has long history of cigarette smoking. Unclear etiology at this time. Patient scheduled for in-person visit on 3/12 for further evaluation. Case discussed with Dr. Andria Frames who agreed to plan.

## 2020-02-11 NOTE — Assessment & Plan Note (Signed)
Acute. Uncontrolled lip smacking that is new for patient. She is on chronic antipsychotics. Concern for tardive dyskinesia. Will defer to PCP.

## 2020-02-17 ENCOUNTER — Other Ambulatory Visit: Payer: Self-pay | Admitting: Family Medicine

## 2020-02-17 DIAGNOSIS — F410 Panic disorder [episodic paroxysmal anxiety] without agoraphobia: Secondary | ICD-10-CM

## 2020-02-26 ENCOUNTER — Other Ambulatory Visit: Payer: Self-pay

## 2020-02-26 ENCOUNTER — Encounter: Payer: Self-pay | Admitting: Family Medicine

## 2020-02-26 ENCOUNTER — Ambulatory Visit (INDEPENDENT_AMBULATORY_CARE_PROVIDER_SITE_OTHER): Payer: Medicare Other | Admitting: Family Medicine

## 2020-02-26 DIAGNOSIS — Z1231 Encounter for screening mammogram for malignant neoplasm of breast: Secondary | ICD-10-CM | POA: Diagnosis not present

## 2020-02-26 DIAGNOSIS — B370 Candidal stomatitis: Secondary | ICD-10-CM | POA: Diagnosis not present

## 2020-02-26 DIAGNOSIS — B3781 Candidal esophagitis: Secondary | ICD-10-CM

## 2020-02-26 MED ORDER — NYSTATIN 100000 UNIT/ML MT SUSP: 5.0000 mL | Freq: Four times a day (QID) | OROMUCOSAL | 0 refills | Status: DC

## 2020-02-26 NOTE — Patient Instructions (Signed)
You look good. I hope the mouthwash clears up your sore throat. See me in one month if your are still having problems Get your COVID vaccine as soon as possible. See me in three months if things are going well.

## 2020-02-27 ENCOUNTER — Encounter: Payer: Self-pay | Admitting: Family Medicine

## 2020-02-27 NOTE — Progress Notes (Signed)
    SUBJECTIVE:   CHIEF COMPLAINT / HPI:   Sore throat.  Patient has 2-3 weeks of worsening.  Burning in upper throat with swallowing.  Bilateral ear pain.  Feels like the thrush she had before.  Risk factor Flonase?    States breathing OK during day.  No fever, cough or wheezing.  Does have spells of SOB at night "which may be my anxiety."    OBJECTIVE:   BP 130/70   Pulse (!) 49   Ht 5\' 4"  (1.626 m)   Wt 208 lb 8 oz (94.6 kg)   SpO2 98%   BMI 35.79 kg/m   TMs normal bilaterally Throat, mild injection.  No white patches. Neck, no adenopathy Lungs clear without wheeze.  ASSESSMENT/PLAN:   Thrush of mouth and esophagus ([place]) Will go with clinical dx of thrush/candiasis.  Will FU if fails to respond to nystatin.     Zenia Resides, MD [place]

## 2020-02-27 NOTE — Assessment & Plan Note (Signed)
Will go with clinical dx of thrush/candiasis.  Will FU if fails to respond to nystatin.

## 2020-02-29 ENCOUNTER — Ambulatory Visit: Payer: Medicare Other | Attending: Internal Medicine

## 2020-02-29 DIAGNOSIS — Z23 Encounter for immunization: Secondary | ICD-10-CM

## 2020-03-22 ENCOUNTER — Other Ambulatory Visit: Payer: Self-pay | Admitting: Family Medicine

## 2020-03-22 DIAGNOSIS — K219 Gastro-esophageal reflux disease without esophagitis: Secondary | ICD-10-CM

## 2020-03-22 DIAGNOSIS — F411 Generalized anxiety disorder: Secondary | ICD-10-CM

## 2020-03-25 ENCOUNTER — Ambulatory Visit: Payer: Medicare Other | Attending: Internal Medicine

## 2020-03-25 DIAGNOSIS — Z23 Encounter for immunization: Secondary | ICD-10-CM

## 2020-03-25 NOTE — Progress Notes (Signed)
   Covid-19 Vaccination Clinic  Name:  Erica Morris    MRN: [id number] DOB: Jul 27, 1946  03/25/2020  [name] was observed post Covid-19 immunization for 15 minutes without incident. She was provided with Vaccine Information Sheet and instruction to access the V-Safe system.   [name] was instructed to call 911 with any severe reactions post vaccine: Marland Kitchen Difficulty breathing  . Swelling of face and throat  . A fast heartbeat  . A bad rash all over body  . Dizziness and weakness   Immunizations Administered    Name Date Dose VIS Date Route   Pfizer COVID-19 Vaccine 03/25/2020  9:26 AM 0.3 mL 01/24/2019 Intramuscular   Manufacturer: [place]   Lot: [id number]   [place]: [id number]

## 2020-04-04 ENCOUNTER — Ambulatory Visit (INDEPENDENT_AMBULATORY_CARE_PROVIDER_SITE_OTHER): Payer: Medicare Other | Admitting: Family Medicine

## 2020-04-04 ENCOUNTER — Other Ambulatory Visit: Payer: Self-pay

## 2020-04-04 ENCOUNTER — Encounter: Payer: Self-pay | Admitting: Family Medicine

## 2020-04-04 DIAGNOSIS — M7061 Trochanteric bursitis, right hip: Secondary | ICD-10-CM

## 2020-04-04 DIAGNOSIS — F411 Generalized anxiety disorder: Secondary | ICD-10-CM | POA: Diagnosis not present

## 2020-04-04 DIAGNOSIS — K219 Gastro-esophageal reflux disease without esophagitis: Secondary | ICD-10-CM

## 2020-04-04 DIAGNOSIS — E039 Hypothyroidism, unspecified: Secondary | ICD-10-CM | POA: Diagnosis not present

## 2020-04-04 DIAGNOSIS — M7060 Trochanteric bursitis, unspecified hip: Secondary | ICD-10-CM

## 2020-04-04 DIAGNOSIS — F339 Major depressive disorder, recurrent, unspecified: Secondary | ICD-10-CM

## 2020-04-04 HISTORY — DX: Trochanteric bursitis, unspecified hip: M70.60

## 2020-04-04 MED ORDER — LEVOTHYROXINE SODIUM 50 MCG PO TABS: 50.0000 ug | ORAL_TABLET | Freq: Every day | ORAL | 4 refills | Status: DC

## 2020-04-04 NOTE — Assessment & Plan Note (Signed)
Counseled on making sure she takes pantoprazole everyday and prescribed.

## 2020-04-04 NOTE — Progress Notes (Signed)
    SUBJECTIVE:   CHIEF COMPLAINT / HPI:   R Hip pain: This has been going on for 2-3 days. Radiates along her outer R thigh. Painful when walking and sleeping on. First noticed it when she was mopping. She additionally endorses mild pain in her calf muscles down to her heels bilaterally. Tylenol helps the pain for a while. No skin changes. Never had anything like this before. Home health nurse performed QuantoFlo test which showed she has moderately decreased circulation in legs bilaterally and the nurse said that her pain may be due to a blood clot. He legs and feet usually feel cold. No numbness or loss of feeling in feet or legs. No fever, recent illness, or unexpected weight loss. No recent surgeries or immobilization. Family history of blood clot calf in brother.   Anxiety/dopression: She is feeling down this week. Daughter ran her out. Staying with her son and his wife. Says they are taking her money. She is afraid of ending up in a nursing home and does not want to go there, however states that her current living situation problematic. Her thoughts keep her up at night. PHQ-9 at 20. Would like to see a therapist/councelor.   GERD: This has increased in severity over the last couple weeks. She has been having trouble eating this week as well. Says her heartburn was especially worse yesterday.    PERTINENT  PMH / PSH: GERD, MDD, Anxiety, polypharmacy  OBJECTIVE:   BP (!) 150/72   Pulse (!) 47   Ht 5\' 4"  (1.626 m)   Wt 211 lb 3.2 oz (95.8 kg)   SpO2 98%   BMI 36.25 kg/m   Physical Exam  Constitutional: She is well-developed, well-nourished, and in no distress.  Cardiovascular: Normal rate, regular rhythm and normal heart sounds.  Pulmonary/Chest: Effort normal and breath sounds normal.  Musculoskeletal:        General: Edema present.     Comments: 1+ pitting edema of legs bilaterally. Homer's sign negative. Mild tenderness to palpation of calves bilaterally. Pain on palpation over  R greater trochanter.  Neurological: She is alert.  Skin: Skin is warm. No rash noted. She is not diaphoretic. No erythema.     ASSESSMENT/PLAN:   Greater trochanteric bursitis Given her symptoms and localization of pain around the outer surface of the R hip, this is most likely Greater Trochanteric Bursitis. Differential diagnosis includes radiculopathy thought the pattern and localization of her pain around her hip is not characteristic. Calf pain is more likely to be due to the house work she does daily. This is very unlikely to be caused by a blood clot as she has strong pedal pulses, no increase in pain on prolonged usage of her leg, and no skin changes. She was not interested in cortisone shot at this time. Treating with tylenol and Voltaren gel as needed for the pain. Will continue to monitor.  Anxiety state Provided her with a list of therapist covered by Medicare. She will call and schedule.  GERD Counseled on making sure she takes pantoprazole everyday and prescribed.      [place]

## 2020-04-04 NOTE — Patient Instructions (Addendum)
It was great seeing you today, thanks for coming in!  Today we talked about:  We will continue to watch your hip pain for now. Take tylenol as needed and apply some OTC Voltaren gel a couple times a day for pain.  We will put a list of Counselors/Therapists together for you that you can call to set up an appointment with.  For your heartburn, make sure you are taking the pantoprazole everyday as prescribed.   Please bring in all of your medications in with you to your next visit, so we can send them all over to the correct pharmacy.   Thank you, Labrian Torregrossa, Medical Student  Here are places you can call to make a therapy appointment.   [employer] Mon-Fri, 8:30-5:00  [address] [place], [place] [address]  [phone] [email or web address]  [phone] (Immediate assistance)   [facility]  Walk-in Mon-Fri, 8am-3pm  [address] [place], [place] [email or web address]   [facility] [place] ([facility] [place]) walk in M-F 8am-12pm and 1pm-3pm  [place] Phone: [phone]   [facility] ([place] and substance challenges)  [address], [place] [phone]  [email or web address]   Phone: [place] [phone]

## 2020-04-04 NOTE — Assessment & Plan Note (Signed)
Provided her with a list of therapist covered by Medicare. She will call and schedule.

## 2020-04-04 NOTE — Assessment & Plan Note (Addendum)
Given her symptoms and localization of pain around the outer surface of the R hip, this is most likely Greater Trochanteric Bursitis. Differential diagnosis includes radiculopathy thought the pattern and localization of her pain around her hip is not characteristic. Calf pain is more likely to be due to the house work she does daily. This is very unlikely to be caused by a blood clot as she has strong pedal pulses, no increase in pain on prolonged usage of her leg, no skin changes, Well's score and Homor's sign negative.Erica Morris She was not interested in cortisone shot at this time. Treating with tylenol and Voltaren gel as needed for the pain. Will continue to monitor.

## 2020-04-05 ENCOUNTER — Encounter: Payer: Self-pay | Admitting: Family Medicine

## 2020-04-05 NOTE — Progress Notes (Signed)
See my attestation.

## 2020-04-05 NOTE — Assessment & Plan Note (Signed)
Moderate.  Slightly worse due to living situation.

## 2020-05-13 ENCOUNTER — Other Ambulatory Visit: Payer: Self-pay | Admitting: Family Medicine

## 2020-05-13 DIAGNOSIS — F410 Panic disorder [episodic paroxysmal anxiety] without agoraphobia: Secondary | ICD-10-CM

## 2020-05-14 ENCOUNTER — Other Ambulatory Visit: Payer: Self-pay | Admitting: Family Medicine

## 2020-05-14 ENCOUNTER — Ambulatory Visit (INDEPENDENT_AMBULATORY_CARE_PROVIDER_SITE_OTHER): Payer: Medicare Other | Admitting: Family Medicine

## 2020-05-14 ENCOUNTER — Other Ambulatory Visit: Payer: Self-pay

## 2020-05-14 VITALS — BP 128/64 | HR 53 | Wt 207.0 lb

## 2020-05-14 DIAGNOSIS — R6884 Jaw pain: Secondary | ICD-10-CM | POA: Diagnosis not present

## 2020-05-14 DIAGNOSIS — F339 Major depressive disorder, recurrent, unspecified: Secondary | ICD-10-CM

## 2020-05-14 MED ORDER — DICLOFENAC SODIUM 1 % EX GEL: 2.0000 g | Freq: Four times a day (QID) | CUTANEOUS | 1 refills | Status: DC

## 2020-05-14 NOTE — Progress Notes (Signed)
    SUBJECTIVE:   CHIEF COMPLAINT / HPI:   Bilateral jaw pain States that her lip started quivering 1 week ago. She is concerned it is a stroke.   She says that she has eating less, but able to tolerate liquids and soft foods. Duration: 1 week, worsening Endorses chills Pain worse when jaw shivers.  Also having HA, bilateral frontal temporal distribution Endorses difficulty swallowing, but no dysarthria Feels like "Jaw locking up"  Denies any vision changes Denies fevers Denies any runny nose Patient takes Tylenol and this does help with symptoms.    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  Will get ESR  to rule out temporal arteritis, but unlikely given bilaterality.  -ESR -Physical therapy for TMJ -Tylenol as needed -Topical diclofenac as needed     Bonnita Hollow, MD [place]

## 2020-05-14 NOTE — Patient Instructions (Signed)

## 2020-05-14 NOTE — Assessment & Plan Note (Signed)
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  Will get ESR  to rule out temporal arteritis, but unlikely given bilaterality.  -ESR -Physical therapy for TMJ -Tylenol as needed -Topical diclofenac as needed

## 2020-05-15 ENCOUNTER — Telehealth: Payer: Self-pay | Admitting: *Deleted

## 2020-05-15 DIAGNOSIS — H35342 Macular cyst, hole, or pseudohole, left eye: Secondary | ICD-10-CM | POA: Diagnosis not present

## 2020-05-15 DIAGNOSIS — H16223 Keratoconjunctivitis sicca, not specified as Sjogren's, bilateral: Secondary | ICD-10-CM | POA: Diagnosis not present

## 2020-05-15 DIAGNOSIS — H11153 Pinguecula, bilateral: Secondary | ICD-10-CM | POA: Diagnosis not present

## 2020-05-15 DIAGNOSIS — H53133 Sudden visual loss, bilateral: Secondary | ICD-10-CM | POA: Diagnosis not present

## 2020-05-15 DIAGNOSIS — H18413 Arcus senilis, bilateral: Secondary | ICD-10-CM | POA: Diagnosis not present

## 2020-05-15 LAB — SEDIMENTATION RATE: Sed Rate: 6 mm/hr (ref 0–40)

## 2020-05-15 NOTE — Telephone Encounter (Signed)
LM for patient that labs are normal. Jazmin Hartsell,CMA

## 2020-05-15 NOTE — Telephone Encounter (Signed)
-----   Message from Bonnita Hollow, MD sent at 05/15/2020  3:32 PM EDT ----- Patient labs are normal. Request staff contact patient to inform.

## 2020-05-21 ENCOUNTER — Encounter (INDEPENDENT_AMBULATORY_CARE_PROVIDER_SITE_OTHER): Payer: Medicare Other | Admitting: Ophthalmology

## 2020-06-05 ENCOUNTER — Encounter (INDEPENDENT_AMBULATORY_CARE_PROVIDER_SITE_OTHER): Payer: Medicare Other | Admitting: Ophthalmology

## 2020-06-05 ENCOUNTER — Other Ambulatory Visit: Payer: Self-pay

## 2020-06-05 DIAGNOSIS — I10 Essential (primary) hypertension: Secondary | ICD-10-CM

## 2020-06-05 DIAGNOSIS — H43813 Vitreous degeneration, bilateral: Secondary | ICD-10-CM | POA: Diagnosis not present

## 2020-06-05 DIAGNOSIS — H35343 Macular cyst, hole, or pseudohole, bilateral: Secondary | ICD-10-CM

## 2020-06-05 DIAGNOSIS — H35033 Hypertensive retinopathy, bilateral: Secondary | ICD-10-CM

## 2020-06-18 ENCOUNTER — Other Ambulatory Visit: Payer: Self-pay | Admitting: Family Medicine

## 2020-06-18 DIAGNOSIS — F339 Major depressive disorder, recurrent, unspecified: Secondary | ICD-10-CM

## 2020-07-15 ENCOUNTER — Other Ambulatory Visit: Payer: Self-pay

## 2020-07-15 DIAGNOSIS — J449 Chronic obstructive pulmonary disease, unspecified: Secondary | ICD-10-CM

## 2020-07-15 DIAGNOSIS — K219 Gastro-esophageal reflux disease without esophagitis: Secondary | ICD-10-CM

## 2020-07-15 MED ORDER — PANTOPRAZOLE SODIUM 40 MG PO TBEC: DELAYED_RELEASE_TABLET | ORAL | 3 refills | Status: DC

## 2020-07-15 MED ORDER — ALBUTEROL SULFATE HFA 108 (90 BASE) MCG/ACT IN AERS: 2.0000 | INHALATION_SPRAY | Freq: Four times a day (QID) | RESPIRATORY_TRACT | 6 refills | Status: DC | PRN

## 2020-07-15 NOTE — Telephone Encounter (Signed)
Please send to [employer] [address], [place]

## 2020-07-31 DIAGNOSIS — H539 Unspecified visual disturbance: Secondary | ICD-10-CM | POA: Insufficient documentation

## 2020-07-31 DIAGNOSIS — Z9889 Other specified postprocedural states: Secondary | ICD-10-CM | POA: Diagnosis not present

## 2020-07-31 DIAGNOSIS — Z961 Presence of intraocular lens: Secondary | ICD-10-CM | POA: Diagnosis not present

## 2020-07-31 DIAGNOSIS — H35371 Puckering of macula, right eye: Secondary | ICD-10-CM | POA: Diagnosis not present

## 2020-07-31 DIAGNOSIS — H35342 Macular cyst, hole, or pseudohole, left eye: Secondary | ICD-10-CM | POA: Diagnosis not present

## 2020-08-16 ENCOUNTER — Other Ambulatory Visit: Payer: Self-pay | Admitting: Family Medicine

## 2020-08-16 DIAGNOSIS — I10 Essential (primary) hypertension: Secondary | ICD-10-CM

## 2020-08-20 ENCOUNTER — Encounter (INDEPENDENT_AMBULATORY_CARE_PROVIDER_SITE_OTHER): Payer: Medicare Other | Admitting: Ophthalmology

## 2020-09-05 ENCOUNTER — Ambulatory Visit (INDEPENDENT_AMBULATORY_CARE_PROVIDER_SITE_OTHER): Payer: Medicare Other | Admitting: Ophthalmology

## 2020-09-05 ENCOUNTER — Encounter (INDEPENDENT_AMBULATORY_CARE_PROVIDER_SITE_OTHER): Payer: Self-pay | Admitting: Ophthalmology

## 2020-09-05 ENCOUNTER — Other Ambulatory Visit: Payer: Self-pay

## 2020-09-05 DIAGNOSIS — H35342 Macular cyst, hole, or pseudohole, left eye: Secondary | ICD-10-CM

## 2020-09-05 DIAGNOSIS — Z9889 Other specified postprocedural states: Secondary | ICD-10-CM

## 2020-09-05 DIAGNOSIS — H35352 Cystoid macular degeneration, left eye: Secondary | ICD-10-CM

## 2020-09-05 DIAGNOSIS — H43812 Vitreous degeneration, left eye: Secondary | ICD-10-CM

## 2020-09-05 HISTORY — DX: Other specified postprocedural states: Z98.890

## 2020-09-05 HISTORY — DX: Vitreous degeneration, left eye: H43.812

## 2020-09-05 NOTE — Progress Notes (Signed)
09/05/2020     CHIEF COMPLAINT Patient presents for Retina Evaluation   HISTORY OF PRESENT ILLNESS: Erica Morris is a 74 y.o. female who presents to the clinic today for:   HPI    Retina Evaluation    In right eye.  This started 3 weeks ago.  Duration of weeks.  Associated Symptoms Floaters and Blind Spot.  Context:  distance vision, mid-range vision and near vision.          Comments    Refer-back for mac hole OU - last seen 2014 - Ref'd by Dr. 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 Zadie Rhine in 2014, last visit was 2013-07-26 with 20/80 acuity and now improved to 20/50 OD  Pt c/o floaters and spot in [place] OD x "a few weeks." Pt sts, "I think it's my right eye. I don't know." Pt sts [facility] OS stable.  OS, has had vision loss that the patient upon questioning may have been present for up to a year.       Last edited by Hurman Horn, MD on 09/05/2020 10:47 AM. (History)      Referring physician: Zenia Resides, [place],  [place] [address]  HISTORICAL INFORMATION:   Selected notes from the MEDICAL RECORD NUMBER    Lab Results  Component Value Date   HGBA1C 5.8 (A) 11/08/2019     CURRENT MEDICATIONS: No current outpatient medications on file. (Ophthalmic Drugs)   No current facility-administered medications for this visit. (Ophthalmic Drugs)   Current Outpatient Medications (Other)  Medication Sig  . acetaminophen (TYLENOL) 500 MG tablet Take 1,000 mg by mouth every 6 (six) hours as needed for moderate pain.  Marland Kitchen albuterol (VENTOLIN HFA) 108 (90 Base) MCG/ACT inhaler Inhale 2 puffs into the lungs every 6 (six) hours as needed for wheezing or shortness of breath.  Marland Kitchen amLODipine (NORVASC) 10 MG tablet TAKE 1 TABLET BY MOUTH EVERY DAY  . aspirin EC 81 MG tablet Take 81 mg by mouth every morning.   Marland Kitchen atorvastatin (LIPITOR) 80 MG tablet Take 1 tablet (80 mg total) by mouth daily.  . carbamazepine (TEGRETOL)  200 MG tablet Take 0.5 tablets (100 mg total) by mouth 2 (two) times daily.  . diclofenac Sodium (VOLTAREN) 1 % GEL Apply 2 g topically 4 (four) times daily.  Marland Kitchen FLUoxetine (PROZAC) 40 MG capsule TAKE 1 CAPSULE BY MOUTH EVERY DAY  . fluticasone (FLONASE) 50 MCG/ACT nasal spray Place 2 sprays into both nostrils daily.  . furosemide (LASIX) 20 MG tablet Take 1 tablet (20 mg total) by mouth 2 (two) times daily.  Marland Kitchen levothyroxine (SYNTHROID) 50 MCG tablet Take 1 tablet (50 mcg total) by mouth daily before breakfast.  . losartan (COZAAR) 50 MG tablet Take 1 tablet (50 mg total) by mouth at bedtime.  . metoprolol tartrate (LOPRESSOR) 25 MG tablet TAKE 1 TABLET BY MOUTH TWO  TIMES DAILY  . nystatin (MYCOSTATIN) 100000 UNIT/ML suspension Take 5 mLs (500,000 Units total) by mouth 4 (four) times daily.  . pantoprazole (PROTONIX) 40 MG tablet TAKE 1 TABLET BY MOUTH EVERY DAY  . QUEtiapine (SEROQUEL) 50 MG tablet TAKE 1 TABLET BY MOUTH EVERY MORNING AND 2 TABLET EACH NIGHT   No current facility-administered medications for this visit. (Other)      REVIEW OF SYSTEMS:    ALLERGIES Allergies  Allergen Reactions  . Lisinopril Cough  . Codeine Phosphate Itching  . Morphine  And Related Itching    Burning sensation and itchiness with IV morphine    PAST MEDICAL HISTORY Past Medical History:  Diagnosis Date  . ABDOMINAL PAIN, RECURRENT 03/29/2008  . ANXIETY 10/07/2007  . Candidiasis of mouth 05/30/2010  . GERD 07/25/2007  . Hernia, hiatal   . HYPERTENSION 07/25/2007  . NSTEMI (non-ST elevated myocardial infarction) ([place]) 10/2017  . Stroke [facility])    Past Surgical History:  Procedure Laterality Date  . ABDOMINAL HYSTERECTOMY    . BREAST SURGERY     bx  . KNEE ARTHROSCOPY    . LEFT HEART CATH AND CORONARY ANGIOGRAPHY N/A 11/02/2017   Procedure: LEFT HEART CATH AND CORONARY ANGIOGRAPHY;  Surgeon: Burnell Blanks, MD;  Location: [facility];  Service: Cardiovascular;  Laterality: N/A;     FAMILY HISTORY Family History  Problem Relation Age of Onset  . Dementia Mother   . Alcoholism Mother   . COPD Father   . Diabetes Sister   . Hypertension Sister   . COPD Brother   . Lung cancer Brother   . Clotting disorder Brother   . Kidney disease Neg Hx   . Stroke Neg Hx     SOCIAL HISTORY Social History   Tobacco Use  . Smoking status: Never Smoker  . Smokeless tobacco: Never Used  Vaping Use  . Vaping Use: Never used  Substance Use Topics  . Alcohol use: Yes    Comment: occasional  . Drug use: No         OPHTHALMIC EXAM: Base Eye Exam    Visual Acuity (ETDRS)      Right Left   Dist [place] 20/50 +1 20/200   Dist ph [place] NI NI  Pt forgot glasses       Tonometry (Tonopen, 9:51 AM)      Right Left   Pressure 10 10       Pupils      Pupils Dark Light Shape React APD   Right PERRL 3 3 Round Minimal None   Left PERRL 3 3 Round Minimal None       Visual Fields (Counting fingers)      Left Right    Full Full       Extraocular Movement      Right Left    Full Full       Neuro/Psych    Oriented x3: Yes   Mood/Affect: Normal       Dilation    Both eyes: 1.0% Mydriacyl, 2.5% Phenylephrine @ 9:55 AM        Slit Lamp and Fundus Exam    External Exam      Right Left   External Normal Normal       Slit Lamp Exam      Right Left   Lids/Lashes Normal Normal   Conjunctiva/Sclera White and quiet White and quiet   Cornea Clear Clear   Anterior Chamber Deep and quiet Deep and quiet   Iris Round and reactive Round and reactive   Lens Centered posterior chamber intraocular lens    Anterior Vitreous Normal Normal       Fundus Exam      Right Left   Posterior Vitreous Clear vitrectomized Posterior vitreous detachment   Disc 1+ Pallor Normal   C/D Ratio 0.55 0.35   Macula Normal Macular hole, stage III, 350 to 400 m, Watzke sign positive   Vessels Normal Normal   Periphery Normal Normal  IMAGING AND PROCEDURES  Imaging and  Procedures for 09/05/20  OCT, Retina - OU - Both Eyes       Right Eye Quality was good. Scan locations included subfoveal. Central Foveal Thickness: 332. Progression has been stable.   Left Eye Quality was good. Scan locations included subfoveal. Central Foveal Thickness: 394. Progression has worsened.   Notes OD with a history of macular hole and successful macular hole repair. Stable, will observe  OS with apparent newer macular hole With drawbridge sign suggesting recent and also is suggesting possibility of closure with surgical intervention, with CME.                ASSESSMENT/PLAN:  History of vitrectomy History of macular hole repair OD, by Dr. Zadie Rhine, 2014 with preoperative acuity of 2400 now improved to 20/50      ICD-10-CM   1. Macular hole, left eye  H35.342 OCT, Retina - OU - Both Eyes  2. Posterior vitreous detachment of left eye  H43.812   3. Cystoid macular edema, left eye  H35.352 OCT, Retina - OU - Both Eyes  4. History of vitrectomy  Z98.890     1. Patient to return return for preoperative assessment, and incense and medication instructions  2. We will schedule vitrectomy, membrane peel, gas injection left eye in 3 to 5 weeks.  3. Patient understands that if in fact this hole has been present for a year that visual acuity while it may improve with surgery, may be somewhat limited. However without surgical intervention there is no hope for acuity improvement. Discussed the importance of not sleeping on her back for 2 weeks after surgery as well as no travel to elevations after surgery  Ophthalmic Meds Ordered this visit:  No orders of the defined types were placed in this encounter.      Return ,, [facility] local MAC, for We will schedule vitrectomy, membrane peel, gas injection left eye, OS.  There are no Patient Instructions on file for this visit.   Explained the diagnoses, plan, and follow up with the patient and they expressed  understanding.  Patient expressed understanding of the importance of proper follow up care.   Clent Demark Jaquala Fuller M.D. Diseases & Surgery of the Retina and Vitreous Retina & Diabetic [place] 09/05/20     Abbreviations: M myopia (nearsighted); A astigmatism; H hyperopia (farsighted); P presbyopia; Mrx spectacle prescription;  CTL contact lenses; OD right eye; OS left eye; OU both eyes  XT exotropia; ET esotropia; PEK punctate epithelial keratitis; PEE punctate epithelial erosions; DES dry eye syndrome; MGD meibomian gland dysfunction; ATs artificial tears; PFAT's preservative free artificial tears; [place] nuclear sclerotic cataract; PSC posterior subcapsular cataract; ERM epi-retinal membrane; PVD posterior vitreous detachment; RD retinal detachment; DM diabetes mellitus; DR diabetic retinopathy; NPDR non-proliferative diabetic retinopathy; PDR proliferative diabetic retinopathy; CSME clinically significant macular edema; DME diabetic macular edema; dbh dot blot hemorrhages; CWS cotton wool spot; POAG primary open angle glaucoma; C/D cup-to-disc ratio; HVF humphrey visual field; GVF goldmann visual field; OCT optical coherence tomography; IOP intraocular pressure; BRVO Branch retinal vein occlusion; CRVO central retinal vein occlusion; CRAO central retinal artery occlusion; BRAO branch retinal artery occlusion; RT retinal tear; SB scleral buckle; PPV pars plana vitrectomy; VH Vitreous hemorrhage; PRP panretinal laser photocoagulation; IVK intravitreal kenalog; VMT vitreomacular traction; MH Macular hole;  NVD neovascularization of the disc; NVE neovascularization elsewhere; AREDS age related eye disease study; ARMD age related macular degeneration; POAG primary open angle glaucoma; EBMD epithelial/anterior  basement membrane dystrophy; ACIOL anterior chamber intraocular lens; IOL intraocular lens; PCIOL posterior chamber intraocular lens; Phaco/IOL phacoemulsification with intraocular lens placement; [place]  photorefractive keratectomy; LASIK laser assisted in situ keratomileusis; HTN hypertension; DM diabetes mellitus; COPD chronic obstructive pulmonary disease

## 2020-09-05 NOTE — Assessment & Plan Note (Signed)
History of macular hole repair OD, by Dr. Zadie Rhine, 2014 with preoperative acuity of 2400 now improved to 20/50

## 2020-10-09 ENCOUNTER — Encounter (INDEPENDENT_AMBULATORY_CARE_PROVIDER_SITE_OTHER): Payer: Self-pay

## 2020-10-09 ENCOUNTER — Other Ambulatory Visit: Payer: Self-pay

## 2020-10-09 ENCOUNTER — Ambulatory Visit (INDEPENDENT_AMBULATORY_CARE_PROVIDER_SITE_OTHER): Payer: Medicare Other

## 2020-10-09 DIAGNOSIS — H35342 Macular cyst, hole, or pseudohole, left eye: Secondary | ICD-10-CM

## 2020-10-09 MED ORDER — OFLOXACIN 0.3 % OP SOLN
1.0000 [drp] | Freq: Four times a day (QID) | OPHTHALMIC | 0 refills | Status: DC
Start: 2020-10-09 — End: 2020-11-06

## 2020-10-09 MED ORDER — PREDNISOLONE ACETATE 1 % OP SUSP
1.0000 [drp] | Freq: Four times a day (QID) | OPHTHALMIC | 0 refills | Status: DC
Start: 2020-10-09 — End: 2021-10-03

## 2020-10-09 NOTE — Progress Notes (Signed)
10/09/2020     CHIEF COMPLAINT Patient presents for No chief complaint on file.   HISTORY OF PRESENT ILLNESS: Erica Morris is a 74 y.o. female who presents to the clinic today for:   HPI    Pre Op OS - Vitrectomy, Membrane Peel and gas injection   Last edited by [name] on 10/09/2020  1:19 PM. (History)        HISTORICAL INFORMATION:   Selected notes from the MEDICAL RECORD NUMBER    Lab Results  Component Value Date   HGBA1C 5.8 (A) 11/08/2019     CURRENT MEDICATIONS: No current outpatient medications on file. (Ophthalmic Drugs)   No current facility-administered medications for this visit. (Ophthalmic Drugs)   Current Outpatient Medications (Other)  Medication Sig  . acetaminophen (TYLENOL) 500 MG tablet Take 1,000 mg by mouth every 6 (six) hours as needed for moderate pain.  Marland Kitchen albuterol (VENTOLIN HFA) 108 (90 Base) MCG/ACT inhaler Inhale 2 puffs into the lungs every 6 (six) hours as needed for wheezing or shortness of breath.  Marland Kitchen amLODipine (NORVASC) 10 MG tablet TAKE 1 TABLET BY MOUTH EVERY DAY  . aspirin EC 81 MG tablet Take 81 mg by mouth every morning.   Marland Kitchen atorvastatin (LIPITOR) 80 MG tablet Take 1 tablet (80 mg total) by mouth daily.  . carbamazepine (TEGRETOL) 200 MG tablet Take 0.5 tablets (100 mg total) by mouth 2 (two) times daily.  . diclofenac Sodium (VOLTAREN) 1 % GEL Apply 2 g topically 4 (four) times daily.  Marland Kitchen FLUoxetine (PROZAC) 40 MG capsule TAKE 1 CAPSULE BY MOUTH EVERY DAY  . fluticasone (FLONASE) 50 MCG/ACT nasal spray Place 2 sprays into both nostrils daily.  . furosemide (LASIX) 20 MG tablet Take 1 tablet (20 mg total) by mouth 2 (two) times daily.  Marland Kitchen levothyroxine (SYNTHROID) 50 MCG tablet Take 1 tablet (50 mcg total) by mouth daily before breakfast.  . losartan (COZAAR) 50 MG tablet Take 1 tablet (50 mg total) by mouth at bedtime.  . metoprolol tartrate (LOPRESSOR) 25 MG tablet TAKE 1 TABLET BY MOUTH TWO  TIMES DAILY  . nystatin  (MYCOSTATIN) 100000 UNIT/ML suspension Take 5 mLs (500,000 Units total) by mouth 4 (four) times daily.  . pantoprazole (PROTONIX) 40 MG tablet TAKE 1 TABLET BY MOUTH EVERY DAY  . QUEtiapine (SEROQUEL) 50 MG tablet TAKE 1 TABLET BY MOUTH EVERY MORNING AND 2 TABLET EACH NIGHT   No current facility-administered medications for this visit. (Other)     ALLERGIES Allergies  Allergen Reactions  . Lisinopril Cough  . Codeine Phosphate Itching  . Morphine And Related Itching    Burning sensation and itchiness with IV morphine    PAST MEDICAL HISTORY Past Medical History:  Diagnosis Date  . ABDOMINAL PAIN, RECURRENT 03/29/2008  . ANXIETY 10/07/2007  . Candidiasis of mouth 05/30/2010  . GERD 07/25/2007  . Hernia, hiatal   . HYPERTENSION 07/25/2007  . NSTEMI (non-ST elevated myocardial infarction) ([place]) 10/2017  . Stroke [facility])    Past Surgical History:  Procedure Laterality Date  . ABDOMINAL HYSTERECTOMY    . BREAST SURGERY     bx  . KNEE ARTHROSCOPY    . LEFT HEART CATH AND CORONARY ANGIOGRAPHY N/A 11/02/2017   Procedure: LEFT HEART CATH AND CORONARY ANGIOGRAPHY;  Surgeon: Burnell Blanks, MD;  Location: [facility];  Service: Cardiovascular;  Laterality: N/A;    FAMILY HISTORY Family History  Problem Relation Age of Onset  . Dementia Mother   .  Alcoholism Mother   . COPD Father   . Diabetes Sister   . Hypertension Sister   . COPD Brother   . Lung cancer Brother   . Clotting disorder Brother   . Kidney disease Neg Hx   . Stroke Neg Hx     SOCIAL HISTORY Social History   Tobacco Use  . Smoking status: Never Smoker  . Smokeless tobacco: Never Used  Vaping Use  . Vaping Use: Never used  Substance Use Topics  . Alcohol use: Yes    Comment: occasional  . Drug use: No         OPHTHALMIC EXAM:  Base Eye Exam    Visual Acuity (Snellen - Linear)      Right Left   Dist [place] 20/50 + E Card @ 5'   Dist ph [place] NI NI       Tonometry (Tonopen, 1:34 PM)       Right Left   Pressure 21 22       Neuro/Psych    Oriented x3: Yes   Mood/Affect: Normal          IMAGING AND PROCEDURES  Imaging and Procedures for @TODAY @           ASSESSMENT/PLAN:  No diagnosis found.  Ophthalmic Meds Ordered this visit:  No orders of the defined types were placed in this encounter.       Pre-op completed. Operative consent obtained with pre-op eye drops reviewed with Mikey Kirschner and sent via [place] as needed. Post op instructions reviewed with patient and per patient all questions answered.  [name]

## 2020-10-14 ENCOUNTER — Other Ambulatory Visit: Payer: Self-pay

## 2020-10-14 ENCOUNTER — Ambulatory Visit (INDEPENDENT_AMBULATORY_CARE_PROVIDER_SITE_OTHER): Payer: Medicare Other

## 2020-10-14 DIAGNOSIS — Z23 Encounter for immunization: Secondary | ICD-10-CM

## 2020-10-14 NOTE — Progress Notes (Signed)
Patient presents in nurse clinic for flu vaccine.   Vaccine administered RD without complication.   See admin for details.

## 2020-10-16 ENCOUNTER — Encounter (INDEPENDENT_AMBULATORY_CARE_PROVIDER_SITE_OTHER): Payer: Medicare Other | Admitting: Ophthalmology

## 2020-10-16 DIAGNOSIS — H35342 Macular cyst, hole, or pseudohole, left eye: Secondary | ICD-10-CM

## 2020-10-17 ENCOUNTER — Ambulatory Visit (INDEPENDENT_AMBULATORY_CARE_PROVIDER_SITE_OTHER): Payer: Medicare Other | Admitting: Ophthalmology

## 2020-10-17 ENCOUNTER — Encounter (INDEPENDENT_AMBULATORY_CARE_PROVIDER_SITE_OTHER): Payer: Self-pay | Admitting: Ophthalmology

## 2020-10-17 ENCOUNTER — Other Ambulatory Visit: Payer: Self-pay

## 2020-10-17 DIAGNOSIS — Z9889 Other specified postprocedural states: Secondary | ICD-10-CM

## 2020-10-17 DIAGNOSIS — H35342 Macular cyst, hole, or pseudohole, left eye: Secondary | ICD-10-CM | POA: Insufficient documentation

## 2020-10-17 NOTE — Assessment & Plan Note (Signed)
Stop day #1, patient instructed to commence with eyedrops topically  Ofloxacin 1 drop OS left eye 4 times daily and   prednisolone acetate left eye 1 drop OS 4 times daily  Postoperative positioning reviewed with the patient critical ports of face down during the day as much as physically possible.  Macular hole surgery was explained with the need for a gas injection. The patient is aware of proper face down position (like looking downward naturally while  reading a book in a lap) for 7  days. Patient was advised to not lay on their back while sleeping or resting, usually for 2 weeks. Do not travel to places of elevation while gas bubble is in the eye, usually for 2 weeks

## 2020-10-17 NOTE — Progress Notes (Signed)
10/17/2020     CHIEF COMPLAINT Patient presents for Post-op Follow-up   HISTORY OF PRESENT ILLNESS: Erica Morris is a 74 y.o. female who presents to the clinic today for:   HPI    Post-op Follow-up    In left eye.  Discomfort includes tearing.  Vision is stable.          Comments    1 Day PO OS, Vitrectomy, Membrane Peel, Gas Injection.   No pain or pressure, eye running. Pt hasn't started drops yet.        Last edited by Nichola Sizer D on 10/17/2020  8:05 AM. (History)      Referring physician: Zenia Resides, MD [place],  [place] [address]  HISTORICAL INFORMATION:   Selected notes from the MEDICAL RECORD NUMBER    Lab Results  Component Value Date   HGBA1C 5.8 (A) 11/08/2019     CURRENT MEDICATIONS: Current Outpatient Medications (Ophthalmic Drugs)  Medication Sig  . ofloxacin (OCUFLOX) 0.3 % ophthalmic solution Place 1 drop into the left eye 4 (four) times daily for 10 days.  . prednisoLONE acetate (PRED FORTE) 1 % ophthalmic suspension Place 1 drop into the left eye 4 (four) times daily.   No current facility-administered medications for this visit. (Ophthalmic Drugs)   Current Outpatient Medications (Other)  Medication Sig  . acetaminophen (TYLENOL) 500 MG tablet Take 1,000 mg by mouth every 6 (six) hours as needed for moderate pain.  Marland Kitchen albuterol (VENTOLIN HFA) 108 (90 Base) MCG/ACT inhaler Inhale 2 puffs into the lungs every 6 (six) hours as needed for wheezing or shortness of breath.  Marland Kitchen amLODipine (NORVASC) 10 MG tablet TAKE 1 TABLET BY MOUTH EVERY DAY  . aspirin EC 81 MG tablet Take 81 mg by mouth every morning.   Marland Kitchen atorvastatin (LIPITOR) 80 MG tablet Take 1 tablet (80 mg total) by mouth daily.  . carbamazepine (TEGRETOL) 200 MG tablet Take 0.5 tablets (100 mg total) by mouth 2 (two) times daily.  . diclofenac Sodium (VOLTAREN) 1 % GEL Apply 2 g topically 4 (four) times daily.  Marland Kitchen FLUoxetine (PROZAC) 40 MG capsule TAKE  1 CAPSULE BY MOUTH EVERY DAY  . fluticasone (FLONASE) 50 MCG/ACT nasal spray Place 2 sprays into both nostrils daily.  . furosemide (LASIX) 20 MG tablet Take 1 tablet (20 mg total) by mouth 2 (two) times daily.  Marland Kitchen levothyroxine (SYNTHROID) 50 MCG tablet Take 1 tablet (50 mcg total) by mouth daily before breakfast.  . losartan (COZAAR) 50 MG tablet Take 1 tablet (50 mg total) by mouth at bedtime.  . metoprolol tartrate (LOPRESSOR) 25 MG tablet TAKE 1 TABLET BY MOUTH TWO  TIMES DAILY  . nystatin (MYCOSTATIN) 100000 UNIT/ML suspension Take 5 mLs (500,000 Units total) by mouth 4 (four) times daily.  . pantoprazole (PROTONIX) 40 MG tablet TAKE 1 TABLET BY MOUTH EVERY DAY  . QUEtiapine (SEROQUEL) 50 MG tablet TAKE 1 TABLET BY MOUTH EVERY MORNING AND 2 TABLET EACH NIGHT   No current facility-administered medications for this visit. (Other)      REVIEW OF SYSTEMS:    ALLERGIES Allergies  Allergen Reactions  . Lisinopril Cough  . Codeine Phosphate Itching  . Morphine And Related Itching    Burning sensation and itchiness with IV morphine    PAST MEDICAL HISTORY Past Medical History:  Diagnosis Date  . ABDOMINAL PAIN, RECURRENT 03/29/2008  . ANXIETY 10/07/2007  . Candidiasis of mouth 05/30/2010  . GERD 07/25/2007  .  Hernia, hiatal   . HYPERTENSION 07/25/2007  . NSTEMI (non-ST elevated myocardial infarction) ([place]) 10/2017  . Stroke [facility])    Past Surgical History:  Procedure Laterality Date  . ABDOMINAL HYSTERECTOMY    . BREAST SURGERY     bx  . KNEE ARTHROSCOPY    . LEFT HEART CATH AND CORONARY ANGIOGRAPHY N/A 11/02/2017   Procedure: LEFT HEART CATH AND CORONARY ANGIOGRAPHY;  Surgeon: Burnell Blanks, MD;  Location: [place];  Service: Cardiovascular;  Laterality: N/A;    FAMILY HISTORY Family History  Problem Relation Age of Onset  . Dementia Mother   . Alcoholism Mother   . COPD Father   . Diabetes Sister   . Hypertension Sister   . COPD Brother   . Lung  cancer Brother   . Clotting disorder Brother   . Kidney disease Neg Hx   . Stroke Neg Hx     SOCIAL HISTORY Social History   Tobacco Use  . Smoking status: Never Smoker  . Smokeless tobacco: Never Used  Vaping Use  . Vaping Use: Never used  Substance Use Topics  . Alcohol use: Yes    Comment: occasional  . Drug use: No         OPHTHALMIC EXAM:  Base Eye Exam    Visual Acuity (ETDRS)      Right Left   Dist [place] 20/40 -1 CF at 3'   Dist ph [place] NI        Tonometry (Tonopen, 8:19 AM)      Right Left   Pressure 12 13       Neuro/Psych    Oriented x3: Yes   Mood/Affect: Normal       Dilation    Left eye: 1.0% Mydriacyl, 2.5% Phenylephrine @ 8:19 AM        Slit Lamp and Fundus Exam    External Exam      Right Left   External Normal Normal       Slit Lamp Exam      Right Left   Lids/Lashes Normal Normal   Conjunctiva/Sclera White and quiet White and quiet   Cornea Clear Clear   Anterior Chamber Deep and quiet Deep and quiet   Iris Round and reactive Round and reactive   Lens Centered posterior chamber intraocular lens    Anterior Vitreous Normal Normal       Fundus Exam      Right Left   Posterior Vitreous  Clear, vitrectomized, 90% gas   Disc  Normal   C/D Ratio  0.35   Macula  Through the gas, 20 diopter lens, ring of macular hole appears still to be present though flat   Vessels  Normal   Periphery  Normal          IMAGING AND PROCEDURES  Imaging and Procedures for 10/17/20           ASSESSMENT/PLAN:  Macular hole of left eye Stop day #1, patient instructed to commence with eyedrops topically  Ofloxacin 1 drop OS left eye 4 times daily and   prednisolone acetate left eye 1 drop OS 4 times daily  Postoperative positioning reviewed with the patient critical ports of face down during the day as much as physically possible.  Macular hole surgery was explained with the need for a gas injection. The patient is aware of proper face  down position (like looking downward naturally while  reading a book in a lap) for 7  days.  Patient was advised to not lay on their back while sleeping or resting, usually for 2 weeks. Do not travel to places of elevation while gas bubble is in the eye, usually for 2 weeks      ICD-10-CM   1. History of vitrectomy  Z98.890   2. Macular hole of left eye  H35.342     1.  Postoperative positioning reviewed and demonstrated with the patient, patient is instructed not to sleep on her back or rest on her back at all  2.  Neck 7 days of facedown positioning are critical to the recovery.  3.  Ophthalmic Meds Ordered this visit:  No orders of the defined types were placed in this encounter.      Return in about 11 days (around 10/28/2020) for dilate, OS, OCT.  There are no Patient Instructions on file for this visit.   Explained the diagnoses, plan, and follow up with the patient and they expressed understanding.  Patient expressed understanding of the importance of proper follow up care.   Clent Demark Rankin M.D. Diseases & Surgery of the Retina and Vitreous Retina & Diabetic [place] 10/17/20     Abbreviations: M myopia (nearsighted); A astigmatism; H hyperopia (farsighted); P presbyopia; Mrx spectacle prescription;  CTL contact lenses; OD right eye; OS left eye; OU both eyes  XT exotropia; ET esotropia; PEK punctate epithelial keratitis; PEE punctate epithelial erosions; DES dry eye syndrome; MGD meibomian gland dysfunction; ATs artificial tears; PFAT's preservative free artificial tears; [place] nuclear sclerotic cataract; PSC posterior subcapsular cataract; ERM epi-retinal membrane; PVD posterior vitreous detachment; RD retinal detachment; DM diabetes mellitus; DR diabetic retinopathy; NPDR non-proliferative diabetic retinopathy; PDR proliferative diabetic retinopathy; CSME clinically significant macular edema; DME diabetic macular edema; dbh dot blot hemorrhages; CWS cotton wool spot; POAG  primary open angle glaucoma; C/D cup-to-disc ratio; HVF humphrey visual field; GVF goldmann visual field; OCT optical coherence tomography; IOP intraocular pressure; BRVO Branch retinal vein occlusion; CRVO central retinal vein occlusion; CRAO central retinal artery occlusion; BRAO branch retinal artery occlusion; RT retinal tear; SB scleral buckle; PPV pars plana vitrectomy; VH Vitreous hemorrhage; PRP panretinal laser photocoagulation; IVK intravitreal kenalog; VMT vitreomacular traction; MH Macular hole;  NVD neovascularization of the disc; NVE neovascularization elsewhere; AREDS age related eye disease study; ARMD age related macular degeneration; POAG primary open angle glaucoma; EBMD epithelial/anterior basement membrane dystrophy; ACIOL anterior chamber intraocular lens; IOL intraocular lens; PCIOL posterior chamber intraocular lens; Phaco/IOL phacoemulsification with intraocular lens placement; [place] photorefractive keratectomy; LASIK laser assisted in situ keratomileusis; HTN hypertension; DM diabetes mellitus; COPD chronic obstructive pulmonary disease

## 2020-10-29 ENCOUNTER — Encounter (INDEPENDENT_AMBULATORY_CARE_PROVIDER_SITE_OTHER): Payer: Medicare Other | Admitting: Ophthalmology

## 2020-11-04 ENCOUNTER — Other Ambulatory Visit: Payer: Self-pay

## 2020-11-04 ENCOUNTER — Encounter (INDEPENDENT_AMBULATORY_CARE_PROVIDER_SITE_OTHER): Payer: Self-pay | Admitting: Ophthalmology

## 2020-11-04 ENCOUNTER — Ambulatory Visit (INDEPENDENT_AMBULATORY_CARE_PROVIDER_SITE_OTHER): Payer: Medicare Other | Admitting: Ophthalmology

## 2020-11-04 DIAGNOSIS — H43812 Vitreous degeneration, left eye: Secondary | ICD-10-CM

## 2020-11-04 DIAGNOSIS — H35342 Macular cyst, hole, or pseudohole, left eye: Secondary | ICD-10-CM

## 2020-11-04 NOTE — Patient Instructions (Signed)
To report any new onset visual acuity changes or declines in acuity

## 2020-11-04 NOTE — Assessment & Plan Note (Signed)
Resolved status post vitrectomy

## 2020-11-04 NOTE — Assessment & Plan Note (Addendum)
Hole via vitrectomy internal limiting membrane peeling gas injection.  Preoperative visual acuity of count fingers is now improved to 20/80 at this early juncture.

## 2020-11-04 NOTE — Progress Notes (Signed)
11/04/2020     CHIEF COMPLAINT Patient presents for Post-op Follow-up   HISTORY OF PRESENT ILLNESS: Erica Morris is a 74 y.o. female who presents to the clinic today for:   HPI    Post-op Follow-up    In left eye.  Discomfort includes none.  Vision is stable.  I, the attending physician,  performed the HPI with the patient and updated documentation appropriately.          Comments    11 Day s\p OS. OCT  Pt states the gas bubble went away last week. Using gtts as directed. Denies pain. Pt wants to know when she can get a new gls rx.       Last edited by [name] on 11/04/2020  9:06 AM. (History)      Referring physician: Zenia Resides, [place],  [place] [id number]  HISTORICAL INFORMATION:   Selected notes from the MEDICAL RECORD NUMBER    Lab Results  Component Value Date   HGBA1C 5.8 (A) 11/08/2019     CURRENT MEDICATIONS: Current Outpatient Medications (Ophthalmic Drugs)  Medication Sig  . prednisoLONE acetate (PRED FORTE) 1 % ophthalmic suspension Place 1 drop into the left eye 4 (four) times daily.   No current facility-administered medications for this visit. (Ophthalmic Drugs)   Current Outpatient Medications (Other)  Medication Sig  . acetaminophen (TYLENOL) 500 MG tablet Take 1,000 mg by mouth every 6 (six) hours as needed for moderate pain.  Marland Kitchen albuterol (VENTOLIN HFA) 108 (90 Base) MCG/ACT inhaler Inhale 2 puffs into the lungs every 6 (six) hours as needed for wheezing or shortness of breath.  Marland Kitchen amLODipine (NORVASC) 10 MG tablet TAKE 1 TABLET BY MOUTH EVERY DAY  . aspirin EC 81 MG tablet Take 81 mg by mouth every morning.   Marland Kitchen atorvastatin (LIPITOR) 80 MG tablet Take 1 tablet (80 mg total) by mouth daily.  . carbamazepine (TEGRETOL) 200 MG tablet Take 0.5 tablets (100 mg total) by mouth 2 (two) times daily.  . diclofenac Sodium (VOLTAREN) 1 % GEL Apply 2 g topically 4 (four) times daily.  Marland Kitchen FLUoxetine (PROZAC) 40 MG  capsule TAKE 1 CAPSULE BY MOUTH EVERY DAY  . fluticasone (FLONASE) 50 MCG/ACT nasal spray Place 2 sprays into both nostrils daily.  . furosemide (LASIX) 20 MG tablet Take 1 tablet (20 mg total) by mouth 2 (two) times daily.  Marland Kitchen levothyroxine (SYNTHROID) 50 MCG tablet Take 1 tablet (50 mcg total) by mouth daily before breakfast.  . losartan (COZAAR) 50 MG tablet Take 1 tablet (50 mg total) by mouth at bedtime.  . metoprolol tartrate (LOPRESSOR) 25 MG tablet TAKE 1 TABLET BY MOUTH TWO  TIMES DAILY  . nystatin (MYCOSTATIN) 100000 UNIT/ML suspension Take 5 mLs (500,000 Units total) by mouth 4 (four) times daily.  . pantoprazole (PROTONIX) 40 MG tablet TAKE 1 TABLET BY MOUTH EVERY DAY  . QUEtiapine (SEROQUEL) 50 MG tablet TAKE 1 TABLET BY MOUTH EVERY MORNING AND 2 TABLET EACH NIGHT   No current facility-administered medications for this visit. (Other)      REVIEW OF SYSTEMS:    ALLERGIES Allergies  Allergen Reactions  . Lisinopril Cough  . Codeine Phosphate Itching  . Morphine And Related Itching    Burning sensation and itchiness with IV morphine    PAST MEDICAL HISTORY Past Medical History:  Diagnosis Date  . ABDOMINAL PAIN, RECURRENT 03/29/2008  . ANXIETY 10/07/2007  . Candidiasis of mouth 05/30/2010  .  GERD 07/25/2007  . Hernia, hiatal   . HYPERTENSION 07/25/2007  . NSTEMI (non-ST elevated myocardial infarction) ([place]) 10/2017  . Stroke [facility])    Past Surgical History:  Procedure Laterality Date  . ABDOMINAL HYSTERECTOMY    . BREAST SURGERY     bx  . KNEE ARTHROSCOPY    . LEFT HEART CATH AND CORONARY ANGIOGRAPHY N/A 11/02/2017   Procedure: LEFT HEART CATH AND CORONARY ANGIOGRAPHY;  Surgeon: Burnell Blanks, MD;  Location: [place] [facility];  Service: Cardiovascular;  Laterality: N/A;    FAMILY HISTORY Family History  Problem Relation Age of Onset  . Dementia Mother   . Alcoholism Mother   . COPD Father   . Diabetes Sister   . Hypertension Sister   . COPD  Brother   . Lung cancer Brother   . Clotting disorder Brother   . Kidney disease Neg Hx   . Stroke Neg Hx     SOCIAL HISTORY Social History   Tobacco Use  . Smoking status: Never Smoker  . Smokeless tobacco: Never Used  Vaping Use  . Vaping Use: Never used  Substance Use Topics  . Alcohol use: Yes    Comment: occasional  . Drug use: No         OPHTHALMIC EXAM:  Base Eye Exam    Visual Acuity (Snellen - Linear)      Right Left   Dist [place] 20/40 20/80   Dist ph [place] NI 20/60 -2       Tonometry (Tonopen, 9:10 AM)      Right Left   Pressure 12 13       Pupils      Pupils Dark Light Shape React APD   Right PERRL 3 2 Round Slow None   Left PERRL 3 2 Round Slow None       Neuro/Psych    Oriented x3: Yes   Mood/Affect: Normal       Dilation    Left eye: 1.0% Mydriacyl, 2.5% Phenylephrine @ 9:10 AM        Slit Lamp and Fundus Exam    External Exam      Right Left   External Normal Normal       Slit Lamp Exam      Right Left   Lids/Lashes Normal Normal   Conjunctiva/Sclera White and quiet White and quiet   Cornea Clear Clear   Anterior Chamber Deep and quiet Deep and quiet   Iris Round and reactive Round and reactive   Lens Centered posterior chamber intraocular lens    Anterior Vitreous Normal Normal       Fundus Exam      Right Left   Posterior Vitreous  Clear, vitrectomized, 90% gas   Disc  Normal   C/D Ratio  0.35   Macula  Through the gas, 20 diopter lens, ring of macular hole appears still to be present though flat   Vessels  Normal   Periphery  Normal          IMAGING AND PROCEDURES  Imaging and Procedures for 11/04/20  OCT, Retina - OU - Both Eyes       Right Eye Quality was good. Scan locations included subfoveal. Central Foveal Thickness: 331. Progression has been stable.   Left Eye Quality was good. Scan locations included subfoveal. Central Foveal Thickness: 254. Progression has improved.   Notes OD with a history of  macular hole and successful macular hole repair. Stable, will observe  OS, macular hole now repaired, resolved CME, improved acuity  At 3-week interval                ASSESSMENT/PLAN:  Macular hole of left eye Hole via vitrectomy internal limiting membrane peeling gas injection.  Preoperative visual acuity of count fingers is now improved to 20/80 at this early juncture.  Posterior vitreous detachment of left eye Resolved status post vitrectomy      ICD-10-CM   1. Macular hole of left eye  H35.342 OCT, Retina - OU - Both Eyes  2. Posterior vitreous detachment of left eye  H43.812     1.  OS, 3-week status post repair macular hole, count fingers vision is now at this early stage is 20/80.  Thin attenuated macular hole closure is now present post macular hole repair   2.  I have encouraged the patient to follow-up with Dr. Clent Jacks for consideration of spectacle correction at any time henceforth  3.  Ophthalmic Meds Ordered this visit:  No orders of the defined types were placed in this encounter.      Return in about 6 months (around 05/05/2021) for DILATE OU, OCT.  Patient Instructions  To report any new onset visual acuity changes or declines in acuity    Explained the diagnoses, plan, and follow up with the patient and they expressed understanding.  Patient expressed understanding of the importance of proper follow up care.   Clent Demark Hal Norrington M.D. Diseases & Surgery of the Retina and Vitreous Retina & Diabetic [place] 11/04/20     Abbreviations: M myopia (nearsighted); A astigmatism; H hyperopia (farsighted); P presbyopia; Mrx spectacle prescription;  CTL contact lenses; OD right eye; OS left eye; OU both eyes  XT exotropia; ET esotropia; PEK punctate epithelial keratitis; PEE punctate epithelial erosions; DES dry eye syndrome; MGD meibomian gland dysfunction; ATs artificial tears; PFAT's preservative free artificial tears; [place] nuclear sclerotic cataract;  PSC posterior subcapsular cataract; ERM epi-retinal membrane; PVD posterior vitreous detachment; RD retinal detachment; DM diabetes mellitus; DR diabetic retinopathy; NPDR non-proliferative diabetic retinopathy; PDR proliferative diabetic retinopathy; CSME clinically significant macular edema; DME diabetic macular edema; dbh dot blot hemorrhages; CWS cotton wool spot; POAG primary open angle glaucoma; C/D cup-to-disc ratio; HVF humphrey visual field; GVF goldmann visual field; OCT optical coherence tomography; IOP intraocular pressure; BRVO Branch retinal vein occlusion; CRVO central retinal vein occlusion; CRAO central retinal artery occlusion; BRAO branch retinal artery occlusion; RT retinal tear; SB scleral buckle; PPV pars plana vitrectomy; VH Vitreous hemorrhage; PRP panretinal laser photocoagulation; IVK intravitreal kenalog; VMT vitreomacular traction; MH Macular hole;  NVD neovascularization of the disc; NVE neovascularization elsewhere; AREDS age related eye disease study; ARMD age related macular degeneration; POAG primary open angle glaucoma; EBMD epithelial/anterior basement membrane dystrophy; ACIOL anterior chamber intraocular lens; IOL intraocular lens; PCIOL posterior chamber intraocular lens; Phaco/IOL phacoemulsification with intraocular lens placement; [place] photorefractive keratectomy; LASIK laser assisted in situ keratomileusis; HTN hypertension; DM diabetes mellitus; COPD chronic obstructive pulmonary disease

## 2020-11-06 ENCOUNTER — Other Ambulatory Visit (INDEPENDENT_AMBULATORY_CARE_PROVIDER_SITE_OTHER): Payer: Self-pay | Admitting: Ophthalmology

## 2020-12-30 ENCOUNTER — Other Ambulatory Visit: Payer: Self-pay | Admitting: Family Medicine

## 2020-12-30 DIAGNOSIS — I5032 Chronic diastolic (congestive) heart failure: Secondary | ICD-10-CM

## 2020-12-30 DIAGNOSIS — I50810 Right heart failure, unspecified: Secondary | ICD-10-CM

## 2021-02-13 ENCOUNTER — Telehealth: Payer: Self-pay | Admitting: Family Medicine

## 2021-02-13 NOTE — Telephone Encounter (Signed)
Called to schedule AWV call couldn't be completed if patient calls back please assist with scheduling, any question ask me. Thanks!

## 2021-02-20 ENCOUNTER — Other Ambulatory Visit: Payer: Self-pay | Admitting: Family Medicine

## 2021-02-26 ENCOUNTER — Ambulatory Visit: Payer: Medicare Other | Admitting: Family Medicine

## 2021-03-04 ENCOUNTER — Ambulatory Visit (INDEPENDENT_AMBULATORY_CARE_PROVIDER_SITE_OTHER): Payer: Medicare Other | Admitting: Family Medicine

## 2021-03-04 ENCOUNTER — Other Ambulatory Visit: Payer: Self-pay

## 2021-03-04 VITALS — BP 136/68 | HR 51 | Ht 64.0 in

## 2021-03-04 DIAGNOSIS — J449 Chronic obstructive pulmonary disease, unspecified: Secondary | ICD-10-CM

## 2021-03-04 DIAGNOSIS — U071 COVID-19: Secondary | ICD-10-CM | POA: Diagnosis not present

## 2021-03-04 MED ORDER — AMOXICILLIN-POT CLAVULANATE 875-125 MG PO TABS: 1.0000 | ORAL_TABLET | Freq: Two times a day (BID) | ORAL | 0 refills | Status: DC

## 2021-03-04 MED ORDER — PREDNISONE 20 MG PO TABS: 40.0000 mg | ORAL_TABLET | Freq: Every day | ORAL | 0 refills | Status: DC

## 2021-03-04 MED ORDER — ALBUTEROL SULFATE HFA 108 (90 BASE) MCG/ACT IN AERS: 2.0000 | INHALATION_SPRAY | Freq: Four times a day (QID) | RESPIRATORY_TRACT | 6 refills | Status: DC | PRN

## 2021-03-04 NOTE — Progress Notes (Signed)
    SUBJECTIVE:   CHIEF COMPLAINT / HPI:   Cough and cold symptoms Patient presents due to having cough, congestion, increased sputum for the last 2 days.  She reports that she has baseline COPD with shortness of breath and although she notes she had difficulty walking into the clinic today she is not short of breath at this time.  She denies any sick contacts she has been vaccinated for COVID-19 with 2 doses but has not received a booster in note last dose was approximately 1 year ago.  Denies any fever.  Reports the coughing is causing her a lot of pain in her ribs and back but she experiences no pain in between coughing.  She does report that she has difficulty getting sputum up when she coughs because the pain is so bad.  When asked about sleeping she reports she takes a medication from her psychiatrist that helps her sleep so she has no difficulty there.  She has been taking over-the-counter cough syrups with little relief.   PERTINENT  PMH / PSH: COPD.  OBJECTIVE:   BP 136/68   Pulse (!) 51   Ht 5\' 4"  (1.626 m)   SpO2 94%   BMI 35.53 kg/m General: Chronically ill-appearing 75 year old female Cardiac: Regular rate and rhythm, no murmurs appreciated Respiratory: Prolonged expiratory phase, rhonchi and wheezing appreciated in all lung fields.  Considerable amount of congestion noted.  No shortness of breath or increased work of breathing. Abdomen: Soft, nontender, positive bowel sounds Extremities: No lower extremity edema appreciated   ASSESSMENT/PLAN:   COPD suggested by initial evaluation [facility]) Patient signs and symptoms consistent with either viral URI versus COPD exacerbation.  Oxygen saturation on room air is 94% which is at goal.  Patient has received 2 doses of the Covid vaccine but has not been boosted and it has been close to a year since her previous vaccination.  We will test for Covid today and we discussed symptomatic management of the symptoms.  I am also going to treat  her for possible COPD exacerbation given her presentation.  Prescription sent for prednisone 40 mg daily for 5 days and Augmentin for 5 days as well.  Strict ED and return precautions given and patient is agreeable to this.     Gifford Shave, MD [place]

## 2021-03-04 NOTE — Patient Instructions (Signed)
It was great seeing you today.  I am sorry you are feeling so crummy.  Regarding your cough try hot tea with honey and continue the over-the-counter medications.  I am not sure if this is Covid versus some other viral upper respiratory infection versus a COPD exacerbation.  I am going to treat you for a COPD exacerbation and I have sent prednisone 40 mg daily for 5 days as well as Augmentin twice daily for 5 days to your pharmacy.  Please let me know if your symptoms do not improve.  If you have any worsening shortness of breath, chest pain, fatigue please seek medical attention at the emergency department.  I have collected a Covid swab and these results should come back in a few days, I will call you with those results.  If you have any questions or concerns please feel free to call the clinic.  I hope you have a wonderful afternoon!   Chronic Obstructive Pulmonary Disease Exacerbation Chronic obstructive pulmonary disease (COPD) is a long-term (chronic) lung problem. In COPD, the flow of air from the lungs is limited. COPD exacerbations are times that breathing gets worse and you need more than your normal treatment. Without treatment, they can be life threatening. If they happen often, your lungs can become more damaged. If your COPD gets worse, your doctor may treat you with:  Medicines.  Oxygen.  Different ways to clear your airway, such as using a mask. Follow these instructions at home: Medicines  Take over-the-counter and prescription medicines only as told by your doctor.  If you take an antibiotic or steroid medicine, do not stop taking the medicine even if you start to feel better.  Keep up with shots (vaccinations) as told by your doctor. Be sure to get a yearly (annual) flu shot. Lifestyle  Do not smoke. If you need help quitting, ask your doctor.  Eat healthy foods.  Exercise regularly.  Get plenty of sleep.  Avoid tobacco smoke and other things that can bother your  lungs.  Wash your hands often with soap and water. This will help keep you from getting an infection. If you cannot use soap and water, use hand sanitizer.  During flu season, avoid areas that are crowded with people. General instructions  Drink enough fluid to keep your pee (urine) clear or pale yellow. Do not do this if your doctor has told you not to.  Use a cool mist machine (vaporizer).  If you use oxygen or a machine that turns medicine into a mist (nebulizer), continue to use it as told.  Follow all instructions for rehabilitation. These are steps you can take to make your body work better.  Keep all follow-up visits as told by your doctor. This is important. Contact a doctor if:  Your COPD symptoms get worse than normal. Get help right away if:  You are short of breath and it gets worse.  You have trouble talking.  You have chest pain.  You cough up blood.  You have a fever.  You keep throwing up (vomiting).  You feel weak or you pass out (faint).  You feel confused.  You are not able to sleep because of your symptoms.  You are not able to do daily activities. Summary  COPD exacerbations are times that breathing gets worse and you need more treatment than normal.  COPD exacerbations can be very serious and may cause your lungs to become more damaged.  Do not smoke. If you need help  quitting, ask your doctor.  Stay up-to-date on your shots. Get a flu shot every year. This information is not intended to replace advice given to you by your health care provider. Make sure you discuss any questions you have with your health care provider. Document Revised: 10/29/2017 Document Reviewed: 12/21/2016 Elsevier Patient Education  2021 [employer].

## 2021-03-05 NOTE — Assessment & Plan Note (Signed)
>>  ASSESSMENT AND PLAN FOR COPD SUGGESTED BY INITIAL EVALUATION (HCC) WRITTEN ON 03/05/2021 10:34 AM BY Derrel Nip, MD  Patient signs and symptoms consistent with either viral URI versus COPD exacerbation.  Oxygen saturation on room air is 94% which is at goal.  Patient has received 2 doses of the Covid vaccine but has not been boosted and it has been close to a year since her previous vaccination.  We will test for Covid today and we discussed symptomatic management of the symptoms.  I am also going to treat her for possible COPD exacerbation given her presentation.  Prescription sent for prednisone 40 mg daily for 5 days and Augmentin for 5 days as well.  Strict ED and return precautions given and patient is agreeable to this.

## 2021-03-05 NOTE — Assessment & Plan Note (Signed)
Patient signs and symptoms consistent with either viral URI versus COPD exacerbation.  Oxygen saturation on room air is 94% which is at goal.  Patient has received 2 doses of the Covid vaccine but has not been boosted and it has been close to a year since her previous vaccination.  We will test for Covid today and we discussed symptomatic management of the symptoms.  I am also going to treat her for possible COPD exacerbation given her presentation.  Prescription sent for prednisone 40 mg daily for 5 days and Augmentin for 5 days as well.  Strict ED and return precautions given and patient is agreeable to this.

## 2021-03-06 LAB — SARS-COV-2, NAA 2 DAY TAT

## 2021-03-06 LAB — NOVEL CORONAVIRUS, NAA: SARS-CoV-2, NAA: NOT DETECTED

## 2021-03-08 ENCOUNTER — Other Ambulatory Visit: Payer: Self-pay

## 2021-03-08 ENCOUNTER — Emergency Department (HOSPITAL_COMMUNITY): Payer: Medicare Other

## 2021-03-08 ENCOUNTER — Emergency Department (HOSPITAL_COMMUNITY)
Admission: EM | Admit: 2021-03-08 | Discharge: 2021-03-08 | Disposition: A | Discharge status: Home / Self Care | Payer: Medicare Other | Attending: Emergency Medicine | Admitting: Emergency Medicine

## 2021-03-08 ENCOUNTER — Encounter (HOSPITAL_COMMUNITY): Payer: Self-pay | Admitting: Emergency Medicine

## 2021-03-08 DIAGNOSIS — Z7982 Long term (current) use of aspirin: Secondary | ICD-10-CM | POA: Diagnosis not present

## 2021-03-08 DIAGNOSIS — Z79899 Other long term (current) drug therapy: Secondary | ICD-10-CM | POA: Insufficient documentation

## 2021-03-08 DIAGNOSIS — E876 Hypokalemia: Secondary | ICD-10-CM | POA: Diagnosis not present

## 2021-03-08 DIAGNOSIS — E039 Hypothyroidism, unspecified: Secondary | ICD-10-CM | POA: Diagnosis not present

## 2021-03-08 DIAGNOSIS — I509 Heart failure, unspecified: Secondary | ICD-10-CM | POA: Diagnosis not present

## 2021-03-08 DIAGNOSIS — Z20822 Contact with and (suspected) exposure to covid-19: Secondary | ICD-10-CM | POA: Insufficient documentation

## 2021-03-08 DIAGNOSIS — J441 Chronic obstructive pulmonary disease with (acute) exacerbation: Secondary | ICD-10-CM | POA: Insufficient documentation

## 2021-03-08 DIAGNOSIS — R0602 Shortness of breath: Secondary | ICD-10-CM | POA: Diagnosis not present

## 2021-03-08 DIAGNOSIS — I110 Hypertensive heart disease with heart failure: Secondary | ICD-10-CM | POA: Diagnosis not present

## 2021-03-08 DIAGNOSIS — Z7952 Long term (current) use of systemic steroids: Secondary | ICD-10-CM | POA: Diagnosis not present

## 2021-03-08 DIAGNOSIS — R059 Cough, unspecified: Secondary | ICD-10-CM | POA: Diagnosis not present

## 2021-03-08 LAB — COMPREHENSIVE METABOLIC PANEL
ALT: 25 U/L (ref 0–44)
AST: 26 U/L (ref 15–41)
Albumin: 3.8 g/dL (ref 3.5–5.0)
Alkaline Phosphatase: 105 U/L (ref 38–126)
Anion gap: 8 (ref 5–15)
BUN: 12 mg/dL (ref 8–23)
CO2: 33 mmol/L — ABNORMAL HIGH (ref 22–32)
Calcium: 8.9 mg/dL (ref 8.9–10.3)
Chloride: 100 mmol/L (ref 98–111)
Creatinine, Ser: 1.11 mg/dL — ABNORMAL HIGH (ref 0.44–1.00)
GFR, Estimated: 52 mL/min — ABNORMAL LOW (ref >60)
Glucose, Bld: 159 mg/dL — ABNORMAL HIGH (ref 70–99)
Potassium: 2.7 mmol/L — CL (ref 3.5–5.1)
Sodium: 141 mmol/L (ref 135–145)
Total Bilirubin: 0.4 mg/dL (ref 0.3–1.2)
Total Protein: 6.9 g/dL (ref 6.5–8.1)

## 2021-03-08 LAB — CBC WITH DIFFERENTIAL/PLATELET
Abs Immature Granulocytes: 0.19 10*3/uL — ABNORMAL HIGH (ref 0.00–0.07)
Basophils Absolute: 0 10*3/uL (ref 0.0–0.1)
Basophils Relative: 0 %
Eosinophils Absolute: 0 10*3/uL (ref 0.0–0.5)
Eosinophils Relative: 0 %
HCT: 41 % (ref 36.0–46.0)
Hemoglobin: 13.5 g/dL (ref 12.0–15.0)
Immature Granulocytes: 2 %
Lymphocytes Relative: 11 %
Lymphs Abs: 1.2 10*3/uL (ref 0.7–4.0)
MCH: 27.4 pg (ref 26.0–34.0)
MCHC: 32.9 g/dL (ref 30.0–36.0)
MCV: 83.3 fL (ref 80.0–100.0)
Monocytes Absolute: 0.3 10*3/uL (ref 0.1–1.0)
Monocytes Relative: 3 %
Neutro Abs: 8.7 10*3/uL — ABNORMAL HIGH (ref 1.7–7.7)
Neutrophils Relative %: 84 %
Platelets: 296 10*3/uL (ref 150–400)
RBC: 4.92 MIL/uL (ref 3.87–5.11)
RDW: 13.5 % (ref 11.5–15.5)
WBC: 10.4 10*3/uL (ref 4.0–10.5)
nRBC: 0 % (ref 0.0–0.2)

## 2021-03-08 LAB — RESP PANEL BY RT-PCR (FLU A&B, COVID) ARPGX2
Influenza A by PCR: NEGATIVE
Influenza B by PCR: NEGATIVE
SARS Coronavirus 2 by RT PCR: NEGATIVE

## 2021-03-08 LAB — MAGNESIUM: Magnesium: 1.7 mg/dL (ref 1.7–2.4)

## 2021-03-08 MED ORDER — PREDNISONE 20 MG PO TABS
20.0000 mg | ORAL_TABLET | Freq: Two times a day (BID) | ORAL | 0 refills | Status: DC
Start: 2021-03-08 — End: 2021-04-23

## 2021-03-08 MED ORDER — ALBUTEROL (5 MG/ML) CONTINUOUS INHALATION SOLN
10.0000 mg/h | INHALATION_SOLUTION | Freq: Once | RESPIRATORY_TRACT | Status: AC
  Administered 2021-03-08: 10 mg/h via RESPIRATORY_TRACT
  Filled 2021-03-08: qty 20

## 2021-03-08 MED ORDER — POTASSIUM CHLORIDE CRYS ER 20 MEQ PO TBCR
40.0000 meq | EXTENDED_RELEASE_TABLET | Freq: Once | ORAL | Status: AC
  Administered 2021-03-08: 40 meq via ORAL
  Filled 2021-03-08: qty 2

## 2021-03-08 MED ORDER — POTASSIUM CHLORIDE CRYS ER 20 MEQ PO TBCR: 20.0000 meq | EXTENDED_RELEASE_TABLET | Freq: Two times a day (BID) | ORAL | 0 refills | Status: DC

## 2021-03-08 MED ORDER — BENZONATATE 100 MG PO CAPS
200.0000 mg | ORAL_CAPSULE | Freq: Once | ORAL | Status: AC
  Administered 2021-03-08: 200 mg via ORAL
  Filled 2021-03-08: qty 2

## 2021-03-08 MED ORDER — POTASSIUM CHLORIDE 10 MEQ/100ML IV SOLN
10.0000 meq | INTRAVENOUS | Status: AC
  Administered 2021-03-08 (×2): 10 meq via INTRAVENOUS
  Filled 2021-03-08 (×2): qty 100

## 2021-03-08 MED ORDER — ALBUTEROL SULFATE HFA 108 (90 BASE) MCG/ACT IN AERS
2.0000 | INHALATION_SPRAY | Freq: Once | RESPIRATORY_TRACT | Status: AC
  Administered 2021-03-08: 2 via RESPIRATORY_TRACT
  Filled 2021-03-08: qty 6.7

## 2021-03-08 MED ORDER — METHYLPREDNISOLONE SODIUM SUCC 125 MG IJ SOLR
125.0000 mg | Freq: Once | INTRAMUSCULAR | Status: AC
  Administered 2021-03-08: 125 mg via INTRAVENOUS
  Filled 2021-03-08: qty 2

## 2021-03-08 MED ORDER — MAGNESIUM SULFATE 2 GM/50ML IV SOLN
2.0000 g | Freq: Once | INTRAVENOUS | Status: AC
  Administered 2021-03-08: 2 g via INTRAVENOUS
  Filled 2021-03-08: qty 50

## 2021-03-08 NOTE — ED Provider Notes (Signed)
[place] EMERGENCY DEPARTMENT Provider Note   CSN: [id number] Arrival date & time: 03/08/21  1434     History Chief Complaint  Patient presents with  . Shortness of Breath    Erica Morris is a 75 y.o. female.  HPI She presents for evaluation of wheezing, despite being treated with antibiotics and steroids, several days ago.  She has COPD.  She has a nonproductive cough.  She feels like she is not getting better on the medication given.  She typically uses albuterol, but no other medications for COPD.  She denies fever, chills, nausea or vomiting.  She has not had any weakness or paresthesias.  There are no other known modifying factors.    Past Medical History:  Diagnosis Date  . ABDOMINAL PAIN, RECURRENT 03/29/2008  . ANXIETY 10/07/2007  . Candidiasis of mouth 05/30/2010  . GERD 07/25/2007  . Hernia, hiatal   . HYPERTENSION 07/25/2007  . NSTEMI (non-ST elevated myocardial infarction) ([place]) 10/2017  . Posterior vitreous detachment of left eye 09/05/2020  . Stroke [facility])     Patient Active Problem List   Diagnosis Date Noted  . Macular hole of left eye 10/17/2020  . History of vitrectomy 09/05/2020  . Jaw pain 05/14/2020  . Greater trochanteric bursitis 04/04/2020  . Muscle, jerky movements (uncontrolled) 02/11/2020  . Hyperglycemia 11/08/2019  . Migraine 09/15/2018  . Breast cancer screening 09/15/2018  . Polypharmacy 06/10/2018  . COPD suggested by initial evaluation ([place]) 01/06/2018  . NSTEMI (non-ST elevated myocardial infarction) ([place]) 10/28/2017  . Vertigo 08/10/2017  . 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 Hypothyroidism 11/17/2016  . Episode of recurrent major depressive disorder ([place])   . History of CVA (cerebrovascular accident)   . Pure hypercholesterolemia 11/06/2016  . Chronic venous insufficiency 11/05/2016  . Peripheral neuropathy 11/05/2016  . Late  effects of CVA (cerebrovascular accident) 06/09/2016  . Orthostatic hypotension 06/09/2016  . Hypokalemia 02/20/2015  . History of colonic polyps 06/04/2014  . Thrush of mouth and esophagus ([place]) 05/30/2010  . Anxiety state 10/07/2007  . Essential hypertension 07/25/2007  . GERD 07/25/2007    Past Surgical History:  Procedure Laterality Date  . ABDOMINAL HYSTERECTOMY    . BREAST SURGERY     bx  . KNEE ARTHROSCOPY    . LEFT HEART CATH AND CORONARY ANGIOGRAPHY N/A 11/02/2017   Procedure: LEFT HEART CATH AND CORONARY ANGIOGRAPHY;  Surgeon: Burnell Blanks, MD;  Location: [facility];  Service: Cardiovascular;  Laterality: N/A;     OB History   No obstetric history on file.     Family History  Problem Relation Age of Onset  . Dementia Mother   . Alcoholism Mother   . COPD Father   . Diabetes Sister   . Hypertension Sister   . COPD Brother   . Lung cancer Brother   . Clotting disorder Brother   . Kidney disease Neg Hx   . Stroke Neg Hx     Social History   Tobacco Use  . Smoking status: Never Smoker  . Smokeless tobacco: Never Used  Vaping Use  . Vaping Use: Never used  Substance Use Topics  . Alcohol use: Yes    Comment: occasional  . Drug use: No    Home Medications Prior to Admission medications   Medication Sig Start Date End Date Taking? Authorizing Provider  potassium chloride SA (KLOR-CON) 20 MEQ tablet Take  1 tablet (20 mEq total) by mouth 2 (two) times daily. 03/08/21  Yes Daleen Bo, MD  predniSONE (DELTASONE) 20 MG tablet Take 1 tablet (20 mg total) by mouth 2 (two) times daily. 03/08/21  Yes Daleen Bo, MD  acetaminophen (TYLENOL) 500 MG tablet Take 1,000 mg by mouth every 6 (six) hours as needed for moderate pain.    [provider]  albuterol (VENTOLIN HFA) 108 (90 Base) MCG/ACT inhaler Inhale 2 puffs into the lungs every 6 (six) hours as needed for wheezing or shortness of breath. 03/04/21   Gifford Shave, MD  amLODipine  (NORVASC) 10 MG tablet TAKE 1 TABLET BY MOUTH EVERY DAY 08/19/20   Zenia Resides, MD  amoxicillin-clavulanate (AUGMENTIN) 875-125 MG tablet Take 1 tablet by mouth 2 (two) times daily. 03/04/21   Gifford Shave, MD  aspirin EC 81 MG tablet Take 81 mg by mouth every morning.     [provider]  atorvastatin (LIPITOR) 80 MG tablet Take 1 tablet (80 mg total) by mouth daily. 11/09/19   Zenia Resides, MD  carbamazepine (TEGRETOL) 200 MG tablet Take 0.5 tablets (100 mg total) by mouth 2 (two) times daily. 10/12/19   Zenia Resides, MD  diclofenac Sodium (VOLTAREN) 1 % GEL Apply 2 g topically 4 (four) times daily. 05/14/20   Bonnita Hollow, MD  FLUoxetine (PROZAC) 40 MG capsule TAKE 1 CAPSULE BY MOUTH EVERY DAY 05/13/20   Zenia Resides, MD  fluticasone (FLONASE) 50 MCG/ACT nasal spray Place 2 sprays into both nostrils daily. 02/06/20   Milus Banister C, DO  furosemide (LASIX) 20 MG tablet TAKE 1 TABLET BY MOUTH TWICE A DAY 12/30/20   Zenia Resides, MD  levothyroxine (SYNTHROID) 50 MCG tablet Take 1 tablet (50 mcg total) by mouth daily before breakfast. 04/04/20   Hensel, Jamal Collin, MD  losartan (COZAAR) 50 MG tablet Take 1 tablet (50 mg total) by mouth at bedtime. 11/08/19   Zenia Resides, MD  metoprolol tartrate (LOPRESSOR) 25 MG tablet TAKE 1 TABLET BY MOUTH TWICE DAILY 02/20/21   Zenia Resides, MD  nystatin (MYCOSTATIN) 100000 UNIT/ML suspension Take 5 mLs (500,000 Units total) by mouth 4 (four) times daily. 02/26/20   Zenia Resides, MD  ofloxacin (OCUFLOX) 0.3 % ophthalmic solution PLACE 1 DROP INTO THE LEFT EYE 4 TIMES DAILY FOR 10 DAYS. 11/06/20   Rankin, Clent Demark, MD  pantoprazole (PROTONIX) 40 MG tablet TAKE 1 TABLET BY MOUTH EVERY DAY 07/15/20   Zenia Resides, MD  prednisoLONE acetate (PRED FORTE) 1 % ophthalmic suspension Place 1 drop into the left eye 4 (four) times daily. 10/09/20   Rankin, Clent Demark, MD  QUEtiapine (SEROQUEL) 50 MG tablet TAKE 1 TABLET BY MOUTH  EVERY MORNING AND 2 TABLET EACH NIGHT 06/18/20   Zenia Resides, MD    Allergies    Lisinopril, Codeine phosphate, and Morphine and related  Review of Systems   Review of Systems  All other systems reviewed and are negative.   Physical Exam Updated Vital Signs BP (!) 145/64   Pulse 62   Resp 14   SpO2 93%   Physical Exam Vitals and nursing note reviewed.  Constitutional:      General: She is not in acute distress.    Appearance: She is well-developed. She is not ill-appearing, toxic-appearing or diaphoretic.  HENT:     Head: Normocephalic and atraumatic.     Right Ear: External ear normal.     Left  Ear: External ear normal.  Eyes:     Conjunctiva/sclera: Conjunctivae normal.     Pupils: Pupils are equal, round, and reactive to light.  Neck:     Trachea: Phonation normal.  Cardiovascular:     Rate and Rhythm: Normal rate and regular rhythm.     Heart sounds: Normal heart sounds.  Pulmonary:     Effort: Pulmonary effort is normal. No respiratory distress.     Breath sounds: No stridor. Wheezing and rhonchi present.     Comments: Somewhat decreased air movement bilaterally.  Persistent dry cough. Chest:     Chest wall: No tenderness.  Abdominal:     General: There is no distension.     Palpations: Abdomen is soft.     Tenderness: There is no abdominal tenderness.  Musculoskeletal:        General: Normal range of motion.     Cervical back: Normal range of motion and neck supple.  Skin:    General: Skin is warm and dry.  Neurological:     Mental Status: She is alert and oriented to person, place, and time.     Cranial Nerves: No cranial nerve deficit.     Sensory: No sensory deficit.     Motor: No abnormal muscle tone.     Coordination: Coordination normal.  Psychiatric:        Mood and Affect: Mood normal.        Behavior: Behavior normal.        Thought Content: Thought content normal.        Judgment: Judgment normal.     ED Results / Procedures /  Treatments   Labs (all labs ordered are listed, but only abnormal results are displayed) Labs Reviewed  COMPREHENSIVE METABOLIC PANEL - Abnormal; Notable for the following components:      Result Value   Potassium 2.7 (*)    CO2 33 (*)    Glucose, Bld 159 (*)    Creatinine, Ser 1.11 (*)    GFR, Estimated 52 (*)    All other components within normal limits  CBC WITH DIFFERENTIAL/PLATELET - Abnormal; Notable for the following components:   Neutro Abs 8.7 (*)    Abs Immature Granulocytes 0.19 (*)    All other components within normal limits  RESP PANEL BY RT-PCR (FLU A&B, COVID) ARPGX2  MAGNESIUM    EKG EKG Interpretation  Date/Time:  Saturday March 08 2021 14:43:27 EDT Ventricular Rate:  59 PR Interval:  143 QRS Duration: 90 QT Interval:  560 QTC Calculation: 555 R Axis:   44 Text Interpretation: Sinus rhythm Low voltage, extremity and precordial leads Nonspecific repol abnormality, diffuse leads Prolonged QT interval Since last tracing QT has lengthened Otherwise no significant change Confirmed by Daleen Bo [phone]) on 03/08/2021 2:58:43 PM   Radiology DG Chest 2 View  Result Date: 03/08/2021 CLINICAL DATA:  Cough and shortness of breath. EXAM: CHEST - 2 VIEW COMPARISON:  12/14/2018 FINDINGS: Lungs are hyperexpanded. Cardiopericardial silhouette is at upper limits of normal for size. The lungs are clear without focal pneumonia, edema, pneumothorax or pleural effusion. Interstitial markings are diffusely coarsened with chronic features. The visualized bony structures of the thorax show no acute abnormality. Telemetry leads overlie the chest. IMPRESSION: Hyperexpansion without acute cardiopulmonary findings. Electronically Signed   By: Misty Stanley M.D.   On: 03/08/2021 15:41    Procedures .Critical Care Performed by: Daleen Bo, MD Authorized by: Daleen Bo, MD   Critical care provider statement:  Critical care time (minutes):  55   Critical care start time:   03/08/2021 3:00 PM   Critical care end time:  03/08/2021 8:45 PM   Critical care time was exclusive of:  Separately billable procedures and treating other patients   Critical care was necessary to treat or prevent imminent or life-threatening deterioration of the following conditions:  Respiratory failure   Critical care was time spent personally by me on the following activities:  Blood draw for specimens, development of treatment plan with patient or surrogate, discussions with consultants, evaluation of patient's response to treatment, examination of patient, obtaining history from patient or surrogate, ordering and performing treatments and interventions, ordering and review of laboratory studies, pulse oximetry, re-evaluation of patient's condition, review of old charts and ordering and review of radiographic studies     Medications Ordered in ED Medications  potassium chloride 10 mEq in 100 mL IVPB (10 mEq Intravenous New Bag/Given 03/08/21 2026)  albuterol (VENTOLIN HFA) 108 (90 Base) MCG/ACT inhaler 2 puff (2 puffs Inhalation Given 03/08/21 1545)  methylPREDNISolone sodium succinate (SOLU-MEDROL) 125 mg/2 mL injection 125 mg (125 mg Intravenous Given 03/08/21 1556)  magnesium sulfate IVPB 2 g 50 mL (0 g Intravenous Stopped 03/08/21 1750)  benzonatate (TESSALON) capsule 200 mg (200 mg Oral Given 03/08/21 1544)  potassium chloride SA (KLOR-CON) CR tablet 40 mEq (40 mEq Oral Given 03/08/21 1818)  albuterol (PROVENTIL,VENTOLIN) solution continuous neb (10 mg/hr Nebulization Given 03/08/21 1820)    ED Course  I have reviewed the triage vital signs and the nursing notes.  Pertinent labs & imaging results that were available during my care of the patient were reviewed by me and considered in my medical decision making (see chart for details).    MDM Rules/Calculators/A&P                           Patient Vitals for the past 24 hrs:  BP Pulse Resp SpO2  03/08/21 2030 (!) 145/64 62 14 93 %  03/08/21  1945 (!) 152/62 63 18 96 %  03/08/21 1930 (!) 151/67 67 15 99 %  03/08/21 1915 (!) 152/101 71 19 97 %  03/08/21 1900 138/69 73 18 100 %  03/08/21 1845 (!) 148/76 67 16 99 %  03/08/21 1830 (!) 155/88 64 20 99 %  03/08/21 1730 (!) 152/66 (!) 58 17 95 %  03/08/21 1700 134/65 (!) 53 19 96 %  03/08/21 1645 137/72 (!) 52 13 97 %  03/08/21 1630 137/63 (!) 52 14 95 %  03/08/21 1615 (!) 146/71 (!) 54 15 96 %  03/08/21 1600 137/72 (!) 49 16 98 %     At time of discharge-Reevaluation with update and discussion. After initial assessment and treatment, an updated evaluation reveals she is comfortable with improved respiratory distress, and stable for discharge.  Findings discussed with the patient and all questions were answered. Daleen Bo   Medical Decision Making:  This patient is presenting for evaluation of cough and COPD, which does require a range of treatment options, and is a complaint that involves a moderate risk of morbidity and mortality. The differential diagnoses include acute bronchitis, COPD exacerbation, pneumonia, heart failure. I decided to review old records, and in summary elderly female presenting with persistent symptoms despite taking medications to treat COPD exacerbation..  I did not require additional historical information from anyone.  Clinical Laboratory Tests Ordered, included viral panel, magnesium, CBC and Metabolic panel. Review indicates normal  except potassium low. Radiologic Tests Ordered, included chest x-ray.  I independently Visualized: Radiographic images, which show hyperinflation without edema or infiltrate  Cardiac Monitor Tracing which shows normal sinus rhythm    Critical Interventions-clinical evaluation, laboratory testing, medication treatment, albuterol, inhaler and nebulizer, Solu-Medrol, magnesium, observation and reassessment  After These Interventions, the Patient was reevaluated and was found improved and stable for discharge.  Oxygen  saturation at discharge 92% on room air.  No respiratory distress at this time.  Mild persistent cough.  She is stable for discharge.  She takes Lasix which is likely contributing to her low potassium.  She will be given a prescription for potassium to start taking tomorrow.  CRITICAL CARE-yes Performed by: Daleen Bo  Nursing Notes Reviewed/ Care Coordinated Applicable Imaging Reviewed Interpretation of Laboratory Data incorporated into ED treatment  The patient appears reasonably screened and/or stabilized for discharge and I doubt any other medical condition or other [facility] requiring further screening, evaluation, or treatment in the ED at this time prior to discharge.  Plan: Home Medications-continue usual; Home Treatments-gradual advance diet and activity; return here if the recommended treatment, does not improve the symptoms; Recommended follow up-PCP, checkup 1 week and as needed.     Final Clinical Impression(s) / ED Diagnoses Final diagnoses:  COPD exacerbation ([place])  Hypokalemia    Rx / DC Orders ED Discharge Orders         Ordered    potassium chloride SA (KLOR-CON) 20 MEQ tablet  2 times daily        03/08/21 2044    predniSONE (DELTASONE) 20 MG tablet  2 times daily        03/08/21 2044           Daleen Bo, MD 03/10/21 1130

## 2021-03-08 NOTE — ED Notes (Signed)
All appropriate discharge materials reviewed at length with patient. Time for questions provided. Pt has no other questions at this time and verbalizes understanding of all provided materials.  

## 2021-03-08 NOTE — ED Notes (Signed)
Patient transported to X-ray 

## 2021-03-08 NOTE — ED Triage Notes (Signed)
Pt. Stated, I have COPD from second hand smoke. I started having wheezing, hard to breath, and especially coughing.Started on Tuesday and saw Dr. On Tuesday. He gave some antibiotic and steriods and I have one more dose. Im no better. Called my Dr. Wilburn Mylar and he told me to come here.

## 2021-03-08 NOTE — Discharge Instructions (Addendum)
Take the medicine that your doctor prescribed the other day.  We sent a prescription for prednisone and potassium to your pharmacy to take starting tomorrow morning.  Use your albuterol inhaler, 2 puffs every 2-3 hours as needed for cough or trouble breathing.  You can also use Robitussin-DM 3 or 4 times a day for cough.  Try to eat and drink regularly.

## 2021-03-17 ENCOUNTER — Ambulatory Visit (INDEPENDENT_AMBULATORY_CARE_PROVIDER_SITE_OTHER): Payer: Medicare Other | Admitting: Family Medicine

## 2021-03-17 VITALS — BP 145/72 | HR 59 | Temp 98.6°F | Ht 64.0 in

## 2021-03-17 DIAGNOSIS — R059 Cough, unspecified: Secondary | ICD-10-CM | POA: Diagnosis not present

## 2021-03-17 DIAGNOSIS — J449 Chronic obstructive pulmonary disease, unspecified: Secondary | ICD-10-CM

## 2021-03-17 DIAGNOSIS — E876 Hypokalemia: Secondary | ICD-10-CM | POA: Diagnosis not present

## 2021-03-17 NOTE — Progress Notes (Signed)
    SUBJECTIVE:   CHIEF COMPLAINT / HPI:   Cough Started 4/3 Seen in 4-5 and treated with Augmentin and prednisone for a COPD exacerbation Also went to ED on 4/9 because her symptoms were not improving In ED she was given albuterol treatment, Solu-Medrol, magnesium She was prescribed 5 more days of prednisone Chest x-ray on 4/9 showed hyperexpansion without acute cardiopulmonary findings She has had a negative Covid test on 4/9 and 4/5 She is not coughing anything up No fevers  Denies congestion, rhinorrhea, sore throat, headache, fatigue, myalgias, nausea, vomiting, Had one episode of diarrhea yesterday Has been drinking normally, has normal UOP No known sick contacts  Has presumed COPD, no PFTs No history of smoking Has been using robitussin DM at night which is helping her  Hypokalemia She was noted to be hypokalemic on 4/9-2.7 Mag was 1.7 She was repleted in ED Has been taking potassium pills  PERTINENT  PMH / PSH: Hypertension, orthostatic hypotension, right-sided heart failure, history of NSTEMI, hypothyroidism, anxiety state, history of CVA  OBJECTIVE:   BP (!) 145/72   Pulse (!) 59   Temp 98.6 F (37 C) (Oral)   Ht 5\' 4"  (1.626 m)   SpO2 97%   BMI 35.53 kg/m    Physical Exam:  General: 75 y.o. female in NAD HEENT: NCAT, throat clear, MMM Cardio: RRR no m/r/g Lungs: CTAB, no wheezing, no rhonchi, no crackles, no IWOB on RA Skin: warm and dry Extremities: No edema   ASSESSMENT/PLAN:   Cough Patient no longer appears to have any signs of infection.  She has had improvement in all of her symptoms and does not have shortness of breath other than when she has a coughing spell.  Cough continues to be her most bothersome symptom.  This is a dry cough.  She has been treated with 10 days of steroids in total, no need to repeat at this time.  Her lungs sound clear on examination and she had a normal chest x-ray on 4/9.  Given that overall she is improving, could  consider cough is related to uncontrolled COPD or post viral cough.  Discussed with patient that postviral cough could take months to improve, which she voiced understanding.  In the meantime, advised against Robitussin-DM use.  She was advised that she can use honey in a warm liquid.  We will go ahead and obtain PFTs for formal diagnosis of COPD.  If this does show COPD, would consider controller therapy for her in the future.  Message sent to pharmacy team to schedule for PFTs.  Hypokalemia Patient left before blood work could be completed for repeat BMP.  Would repeat at next visit.     Cleophas Dunker, [place]

## 2021-03-17 NOTE — Assessment & Plan Note (Signed)
Patient no longer appears to have any signs of infection.  She has had improvement in all of her symptoms and does not have shortness of breath other than when she has a coughing spell.  Cough continues to be her most bothersome symptom.  This is a dry cough.  She has been treated with 10 days of steroids in total, no need to repeat at this time.  Her lungs sound clear on examination and she had a normal chest x-ray on 4/9.  Given that overall she is improving, could consider cough is related to uncontrolled COPD or post viral cough.  Discussed with patient that postviral cough could take months to improve, which she voiced understanding.  In the meantime, advised against Robitussin-DM use.  She was advised that she can use honey in a warm liquid.  We will go ahead and obtain PFTs for formal diagnosis of COPD.  If this does show COPD, would consider controller therapy for her in the future.  Message sent to pharmacy team to schedule for PFTs.

## 2021-03-17 NOTE — Patient Instructions (Signed)
Thank you for coming to see me today. It was a pleasure. Today we talked about:   We will have our pharmacist call you to schedule pulmonary function testing.    You can use honey in a warm liquid for your cough.  The cough is likely from the infection you had and can linger for months afterwards.  Please follow-up with Dr. Andria Frames in 2 months or sooner if no improvement.  If you have any questions or concerns, please do not hesitate to call the office at [phone].  Best,   [place] Constable, DO

## 2021-03-17 NOTE — Assessment & Plan Note (Signed)
Patient left before blood work could be completed for repeat BMP.  Would repeat at next visit.

## 2021-04-21 ENCOUNTER — Ambulatory Visit (INDEPENDENT_AMBULATORY_CARE_PROVIDER_SITE_OTHER): Payer: Medicare Other | Admitting: Family Medicine

## 2021-04-21 ENCOUNTER — Other Ambulatory Visit: Payer: Self-pay

## 2021-04-21 VITALS — BP 160/82 | HR 52 | Wt 202.6 lb

## 2021-04-21 DIAGNOSIS — R059 Cough, unspecified: Secondary | ICD-10-CM

## 2021-04-21 DIAGNOSIS — J069 Acute upper respiratory infection, unspecified: Secondary | ICD-10-CM

## 2021-04-21 DIAGNOSIS — I872 Venous insufficiency (chronic) (peripheral): Secondary | ICD-10-CM | POA: Diagnosis not present

## 2021-04-21 NOTE — Patient Instructions (Addendum)
It was wonderful to see you today.  We will test you for COVID, however suspect that this will be negative.  I think right now you have a viral sinusitis with a mild flare of your breathing.  Recommend scheduling your albuterol inhaler every 4 hours for the next 24 hours, and then slowly tapering off from there.  Please continue to monitor your symptoms and if you are still having struggle with wheezing and frequent cough in the next few days, please call let us know.  Make sure that you are staying hydrated.  You can use nasal saline, Mucinex, over-the-counter cough medication/cough drops, and tea with honey.  Warm showers are also great to help with congestion.  Recommend sleeping slightly elevated just to help with secretions.  Elevate your legs and can always Ace bandage them.  If you have any significant difficulty breathing despite using your albuterol inhaler, please go to the ED.

## 2021-04-21 NOTE — Progress Notes (Signed)
    SUBJECTIVE:   CHIEF COMPLAINT / HPI: Cough  Erica Morris is a 75 year old female presenting to a same day visit with her daughter-in-law to discuss recent cough.  She reports an approximate 2-3-day history of having some frontal pressure, nasal congestion, dry cough, and hearing herself wheeze mainly at night.  She is not have any difficulty breathing, however is bothered by the wheezing.  She reports she had a similar but more severe episode of this about 1.5 months ago, received steroids and Augmentin at that time for presumed COPD exacerbation.  She has not had formal PFTs and is a non-smoker.  Denies any fever, chills, sore throat, rash, or chest pain.  She mainly stays at home, no known sick contacts.  Her daughter-in-law also notes when she picked her up today that she felt her hands looked a little more swollen than usual.  She also felt like her legs looked a little more swollen as well.  She has been taking her Lasix as prescribed.  Has not used compression stockings.   PERTINENT  PMH / PSH: Chronic venous insufficiency, CHF with right heart failure, NSTEMI, hypertension, hypothyroidism, possible COPD/asthma overlap  OBJECTIVE:   BP (!) 160/82   Pulse (!) 52   Wt 202 lb 9.6 oz (91.9 kg)   SpO2 99%   BMI 34.78 kg/m   General: Alert, NAD, older well-appearing female HEENT: NCAT, MMM, oropharynx non-erythematous, bilateral TM clear with appropriate light reflex, tenderness with palpation of frontal and maxillary sinuses, no anterior/posterior cervical lymphadenopathy Cardiac: RRR Lungs: Clear bilaterally, no increased WOB, speaking in full sentences without difficulty, no wheezing or crackles appreciated Abdomen: soft, non-tender Msk: Moves all extremities spontaneously Ext: Warm, dry, 2+ distal pulses, trace to 1+ pitting edema to lower mid shin bilaterally and equally.  No appreciable swelling to bilateral hands/arms.  ASSESSMENT/PLAN:   Viral URI with cough Overall  presentation most consistent with likely viral rhinosinusitis, which may have also elicited a mild bronchitis vs ?COPD/asthma exacerbation with her subjective wheezing at home.  Fortunately breathing comfortably without any wheezing or poor air exchange on exam, satting appropriately on room air.  Will COVID swab today, however low suspicion.  Recommended adequate hydration, Tylenol, honey, cough drops/OTC medications as needed.  Encourage scheduling albuterol every 4 hours for the next 24 hours and then taper as tolerated.  Will check on her in the next few days, if continued wheezing will add on a prednisone course.  Chronic venous insufficiency Likely etiology of mild lower extremity swelling.  Equal bilaterally without concern for DVT.  Weight around baseline at 202 pounds, low suspicion for generalized volume overload.  Doubt CHF exacerbation without additional s/sx concerning for this.  Continue Lasix at 20 mg BID, start elevating legs frequently and wearing compression stockings/Ace bandage.  Encouraged low-salt diet.    Discussed with PCP Dr. Andria Frames.  Follow-up if not improving or sooner if worsening, especially if any difficulty breathing.  Erica Morris, [place]

## 2021-04-22 ENCOUNTER — Encounter: Payer: Self-pay | Admitting: Family Medicine

## 2021-04-22 LAB — NOVEL CORONAVIRUS, NAA: SARS-CoV-2, NAA: NOT DETECTED

## 2021-04-22 LAB — SARS-COV-2, NAA 2 DAY TAT

## 2021-04-23 ENCOUNTER — Other Ambulatory Visit: Payer: Self-pay | Admitting: Family Medicine

## 2021-04-23 ENCOUNTER — Encounter: Payer: Self-pay | Admitting: Family Medicine

## 2021-04-23 ENCOUNTER — Telehealth: Payer: Self-pay | Admitting: Family Medicine

## 2021-04-23 DIAGNOSIS — E039 Hypothyroidism, unspecified: Secondary | ICD-10-CM

## 2021-04-23 DIAGNOSIS — J449 Chronic obstructive pulmonary disease, unspecified: Secondary | ICD-10-CM

## 2021-04-23 MED ORDER — PREDNISONE 20 MG PO TABS: 40.0000 mg | ORAL_TABLET | Freq: Every day | ORAL | 0 refills | Status: AC

## 2021-04-23 NOTE — Assessment & Plan Note (Signed)
Overall presentation most consistent with likely viral rhinosinusitis, which may have also elicited a mild bronchitis vs ?COPD/asthma exacerbation with her subjective wheezing at home.  Fortunately breathing comfortably without any wheezing or poor air exchange on exam, satting appropriately on room air.  Will COVID swab today, however low suspicion.  Recommended adequate hydration, Tylenol, honey, cough drops/OTC medications as needed.  Encourage scheduling albuterol every 4 hours for the next 24 hours and then taper as tolerated.  Will check on her in the next few days, if continued wheezing will add on a prednisone course.

## 2021-04-23 NOTE — Telephone Encounter (Signed)
Reach patient to check-in and let her know her COVID-negative results.  She states that she is still coughing and wheezing mostly at night, stated it kept her up all night last night.  Able to speak in full sentences without concern and did not sound in any respiratory distress when on the phone.  We will go ahead and send in a prednisone 5-day course to assist.

## 2021-04-23 NOTE — Telephone Encounter (Signed)
Noted and agree. 

## 2021-04-23 NOTE — Assessment & Plan Note (Signed)
Likely etiology of mild lower extremity swelling.  Equal bilaterally without concern for DVT.  Weight around baseline at 202 pounds, low suspicion for generalized volume overload.  Doubt CHF exacerbation without additional s/sx concerning for this.  Continue Lasix at 20 mg BID, start elevating legs frequently and wearing compression stockings/Ace bandage.  Encouraged low-salt diet.

## 2021-05-05 ENCOUNTER — Encounter (INDEPENDENT_AMBULATORY_CARE_PROVIDER_SITE_OTHER): Payer: Self-pay | Admitting: Ophthalmology

## 2021-05-05 ENCOUNTER — Ambulatory Visit (INDEPENDENT_AMBULATORY_CARE_PROVIDER_SITE_OTHER): Payer: Medicare Other | Admitting: Ophthalmology

## 2021-05-05 ENCOUNTER — Other Ambulatory Visit: Payer: Self-pay

## 2021-05-05 DIAGNOSIS — Z9889 Other specified postprocedural states: Secondary | ICD-10-CM

## 2021-05-05 DIAGNOSIS — H43812 Vitreous degeneration, left eye: Secondary | ICD-10-CM

## 2021-05-05 DIAGNOSIS — H35342 Macular cyst, hole, or pseudohole, left eye: Secondary | ICD-10-CM | POA: Diagnosis not present

## 2021-05-05 NOTE — Assessment & Plan Note (Addendum)
History of macular hole repair OD 2014, improved vision , no changes

## 2021-05-05 NOTE — Assessment & Plan Note (Signed)
Now 7 months post vitrectomy membrane peel repair of macular hole left eye.  Visual acuity is improved and count fingers now 20/50 left eye

## 2021-05-05 NOTE — Progress Notes (Signed)
05/05/2021     CHIEF COMPLAINT Patient presents for Retina Follow Up (6 month fu OU and OCT/Pt states, "My va is very blurry and I cannot see. I am having a hard time. I need some glasses I think."/)   HISTORY OF PRESENT ILLNESS: Erica Morris is a 75 y.o. female who presents to the clinic today for:   HPI    Retina Follow Up    Diagnosis: Other   Laterality: both eyes   Onset: 6 months ago   Severity: mild   Duration: 6 months   Course: gradually worsening   Comments: 6 month fu OU and OCT Pt states, "My va is very blurry and I cannot see. I am having a hard time. I need some glasses I think."        Last edited by Kendra Opitz, COA on 05/05/2021  9:00 AM. (History)      Referring physician: Zenia Resides, [place] [place],  [place] [address]  HISTORICAL INFORMATION:   Selected notes from the MEDICAL RECORD NUMBER    Lab Results  Component Value Date   HGBA1C 5.8 (A) 11/08/2019     CURRENT MEDICATIONS: Current Outpatient Medications (Ophthalmic Drugs)  Medication Sig  . ofloxacin (OCUFLOX) 0.3 % ophthalmic solution PLACE 1 DROP INTO THE LEFT EYE 4 TIMES DAILY FOR 10 DAYS.  Marland Kitchen prednisoLONE acetate (PRED FORTE) 1 % ophthalmic suspension Place 1 drop into the left eye 4 (four) times daily.   No current facility-administered medications for this visit. (Ophthalmic Drugs)   Current Outpatient Medications (Other)  Medication Sig  . acetaminophen (TYLENOL) 500 MG tablet Take 1,000 mg by mouth every 6 (six) hours as needed for moderate pain.  Marland Kitchen albuterol (VENTOLIN HFA) 108 (90 Base) MCG/ACT inhaler Inhale 2 puffs into the lungs every 6 (six) hours as needed for wheezing or shortness of breath.  Marland Kitchen amLODipine (NORVASC) 10 MG tablet TAKE 1 TABLET BY MOUTH EVERY DAY  . amoxicillin-clavulanate (AUGMENTIN) 875-125 MG tablet Take 1 tablet by mouth 2 (two) times daily.  Marland Kitchen aspirin EC 81 MG tablet Take 81 mg by mouth every morning.   Marland Kitchen atorvastatin (LIPITOR)  80 MG tablet Take 1 tablet (80 mg total) by mouth daily.  . carbamazepine (TEGRETOL) 200 MG tablet Take 0.5 tablets (100 mg total) by mouth 2 (two) times daily.  . diclofenac Sodium (VOLTAREN) 1 % GEL Apply 2 g topically 4 (four) times daily.  Marland Kitchen FLUoxetine (PROZAC) 40 MG capsule TAKE 1 CAPSULE BY MOUTH EVERY DAY  . fluticasone (FLONASE) 50 MCG/ACT nasal spray Place 2 sprays into both nostrils daily.  . furosemide (LASIX) 20 MG tablet TAKE 1 TABLET BY MOUTH TWICE A DAY  . levothyroxine (SYNTHROID) 50 MCG tablet TAKE 1 TABLET BY MOUTH EVERY DAY BEFORE BREAKFAST  . losartan (COZAAR) 50 MG tablet Take 1 tablet (50 mg total) by mouth at bedtime.  . metoprolol tartrate (LOPRESSOR) 25 MG tablet TAKE 1 TABLET BY MOUTH TWICE DAILY  . nystatin (MYCOSTATIN) 100000 UNIT/ML suspension Take 5 mLs (500,000 Units total) by mouth 4 (four) times daily.  . pantoprazole (PROTONIX) 40 MG tablet TAKE 1 TABLET BY MOUTH EVERY DAY  . potassium chloride SA (KLOR-CON) 20 MEQ tablet Take 1 tablet (20 mEq total) by mouth 2 (two) times daily.  . QUEtiapine (SEROQUEL) 50 MG tablet TAKE 1 TABLET BY MOUTH EVERY MORNING AND 2 TABLET EACH NIGHT   No current facility-administered medications for this visit. (Other)  REVIEW OF SYSTEMS:    ALLERGIES Allergies  Allergen Reactions  . Lisinopril Cough  . Codeine Phosphate Itching  . Morphine And Related Itching    Burning sensation and itchiness with IV morphine    PAST MEDICAL HISTORY Past Medical History:  Diagnosis Date  . ABDOMINAL PAIN, RECURRENT 03/29/2008  . ANXIETY 10/07/2007  . Candidiasis of mouth 05/30/2010  . GERD 07/25/2007  . Hernia, hiatal   . HYPERTENSION 07/25/2007  . NSTEMI (non-ST elevated myocardial infarction) ([place]) 10/2017  . Posterior vitreous detachment of left eye 09/05/2020  . Stroke [facility])    Past Surgical History:  Procedure Laterality Date  . ABDOMINAL HYSTERECTOMY    . BREAST SURGERY     bx  . KNEE ARTHROSCOPY    . LEFT HEART CATH  AND CORONARY ANGIOGRAPHY N/A 11/02/2017   Procedure: LEFT HEART CATH AND CORONARY ANGIOGRAPHY;  Surgeon: Burnell Blanks, MD;  Location: [facility];  Service: Cardiovascular;  Laterality: N/A;    FAMILY HISTORY Family History  Problem Relation Age of Onset  . Dementia Mother   . Alcoholism Mother   . COPD Father   . Diabetes Sister   . Hypertension Sister   . COPD Brother   . Lung cancer Brother   . Clotting disorder Brother   . Kidney disease Neg Hx   . Stroke Neg Hx     SOCIAL HISTORY Social History   Tobacco Use  . Smoking status: Never Smoker  . Smokeless tobacco: Never Used  Vaping Use  . Vaping Use: Never used  Substance Use Topics  . Alcohol use: Yes    Comment: occasional  . Drug use: No         OPHTHALMIC EXAM: Base Eye Exam    Visual Acuity (ETDRS)      Right Left   Dist cc 20/40 20/50 -2   Dist ph cc NI NI       Tonometry (Tonopen, 9:03 AM)      Right Left   Pressure 15 17       Pupils      Pupils Dark Light Shape React APD   Right PERRL 3 2 Round Brisk None   Left PERRL 3 2 Round Brisk None       Visual Fields (Counting fingers)      Left Right    Full Full       Extraocular Movement      Right Left    Full Full       Neuro/Psych    Oriented x3: Yes   Mood/Affect: Normal       Dilation    Both eyes: 1.0% Mydriacyl, 2.5% Phenylephrine @ 9:03 AM        Slit Lamp and Fundus Exam    External Exam      Right Left   External Normal Normal       Slit Lamp Exam      Right Left   Lids/Lashes Normal Normal   Conjunctiva/Sclera White and quiet White and quiet   Cornea Clear Clear   Anterior Chamber Deep and quiet Deep and quiet   Iris Round and reactive Round and reactive   Lens Centered posterior chamber intraocular lens    Anterior Vitreous Normal Normal       Fundus Exam      Right Left   Posterior Vitreous Clear vitrectomized Clear, vitrectomized,   Disc 1+ Pallor Normal   C/D Ratio 0.55 0.35   Macula  Normal Normal, negative Watzke   Vessels Normal Normal   Periphery Normal Normal          IMAGING AND PROCEDURES  Imaging and Procedures for 05/05/21  OCT, Retina - OU - Both Eyes       Right Eye Scan locations included subfoveal. Central Foveal Thickness: 331. Progression has been stable.   Left Eye Quality was good. Scan locations included subfoveal. Central Foveal Thickness: 254. Progression has improved. Findings include abnormal foveal contour, myopic contour.   Notes OD with a history of macular hole and successful macular hole repair. Stable, will observe  OS, macular hole now repaired, resolved CME, improved acuity at 51-month interval.  Very thin retinal attachment with outer retinal gap  I do see this in folks who have a early recurrence of macular hole but more importantly in folks who have untreated sleep apnea.  Patient denies having sleep apnea                ASSESSMENT/PLAN:  Macular hole of left eye Now 7 months post vitrectomy membrane peel repair of macular hole left eye.  Visual acuity is improved and count fingers now 20/50 left eye  History of vitrectomy History of macular hole repair OD 2014, improved vision , no changes      ICD-10-CM   1. Macular hole of left eye  H35.342   2. Posterior vitreous detachment of left eye  H43.812 OCT, Retina - OU - Both Eyes  3. History of vitrectomy  Z98.890     1.  OS, acuity is improved status post macular hole repair some 7 months previous.  2.  OD, visual acuity also improved and stable some 8 years post macular hole repair OD  3.  Ophthalmic Meds Ordered this visit:  No orders of the defined types were placed in this encounter.      Return in about 1 year (around 05/05/2022) for DILATE OU, OCT.  There are no Patient Instructions on file for this visit.   Explained the diagnoses, plan, and follow up with the patient and they expressed understanding.  Patient expressed understanding of the  importance of proper follow up care.   Clent Demark Dorene Bruni M.D. Diseases & Surgery of the Retina and Vitreous [facility] [place] 05/05/21     Abbreviations: M myopia (nearsighted); A astigmatism; H hyperopia (farsighted); P presbyopia; Mrx spectacle prescription;  CTL contact lenses; OD right eye; OS left eye; OU both eyes  XT exotropia; ET esotropia; PEK punctate epithelial keratitis; PEE punctate epithelial erosions; DES dry eye syndrome; MGD meibomian gland dysfunction; ATs artificial tears; PFAT's preservative free artificial tears; [place] nuclear sclerotic cataract; PSC posterior subcapsular cataract; ERM epi-retinal membrane; PVD posterior vitreous detachment; RD retinal detachment; DM diabetes mellitus; DR diabetic retinopathy; NPDR non-proliferative diabetic retinopathy; PDR proliferative diabetic retinopathy; CSME clinically significant macular edema; DME diabetic macular edema; dbh dot blot hemorrhages; CWS cotton wool spot; POAG primary open angle glaucoma; C/D cup-to-disc ratio; HVF humphrey visual field; GVF goldmann visual field; OCT optical coherence tomography; IOP intraocular pressure; BRVO Branch retinal vein occlusion; CRVO central retinal vein occlusion; CRAO central retinal artery occlusion; BRAO branch retinal artery occlusion; RT retinal tear; SB scleral buckle; PPV pars plana vitrectomy; VH Vitreous hemorrhage; PRP panretinal laser photocoagulation; IVK intravitreal kenalog; VMT vitreomacular traction; MH Macular hole;  NVD neovascularization of the disc; NVE neovascularization elsewhere; AREDS age related eye disease study; ARMD age related macular degeneration; POAG primary open angle glaucoma; EBMD epithelial/anterior basement membrane  dystrophy; ACIOL anterior chamber intraocular lens; IOL intraocular lens; PCIOL posterior chamber intraocular lens; Phaco/IOL phacoemulsification with intraocular lens placement; [place] photorefractive keratectomy; LASIK laser assisted in situ  keratomileusis; HTN hypertension; DM diabetes mellitus; COPD chronic obstructive pulmonary disease

## 2021-07-02 ENCOUNTER — Telehealth: Payer: Self-pay

## 2021-07-02 NOTE — Telephone Encounter (Signed)
Call to pt to schedule AWV. Unable to leave message at [phone]

## 2021-07-18 ENCOUNTER — Other Ambulatory Visit: Payer: Self-pay | Admitting: Family Medicine

## 2021-07-18 DIAGNOSIS — K219 Gastro-esophageal reflux disease without esophagitis: Secondary | ICD-10-CM

## 2021-07-21 ENCOUNTER — Encounter (HOSPITAL_COMMUNITY): Payer: Self-pay

## 2021-07-21 ENCOUNTER — Emergency Department (HOSPITAL_COMMUNITY)
Admission: EM | Admit: 2021-07-21 | Discharge: 2021-07-21 | Disposition: A | Discharge status: Home / Self Care | Payer: Medicare Other | Attending: Emergency Medicine | Admitting: Emergency Medicine

## 2021-07-21 ENCOUNTER — Other Ambulatory Visit: Payer: Self-pay

## 2021-07-21 ENCOUNTER — Emergency Department (HOSPITAL_COMMUNITY): Payer: Medicare Other

## 2021-07-21 DIAGNOSIS — Z79899 Other long term (current) drug therapy: Secondary | ICD-10-CM | POA: Diagnosis not present

## 2021-07-21 DIAGNOSIS — E039 Hypothyroidism, unspecified: Secondary | ICD-10-CM | POA: Diagnosis not present

## 2021-07-21 DIAGNOSIS — R221 Localized swelling, mass and lump, neck: Secondary | ICD-10-CM | POA: Diagnosis not present

## 2021-07-21 DIAGNOSIS — I110 Hypertensive heart disease with heart failure: Secondary | ICD-10-CM | POA: Insufficient documentation

## 2021-07-21 DIAGNOSIS — M542 Cervicalgia: Secondary | ICD-10-CM | POA: Diagnosis not present

## 2021-07-21 DIAGNOSIS — I509 Heart failure, unspecified: Secondary | ICD-10-CM | POA: Insufficient documentation

## 2021-07-21 DIAGNOSIS — M47812 Spondylosis without myelopathy or radiculopathy, cervical region: Secondary | ICD-10-CM | POA: Diagnosis not present

## 2021-07-21 DIAGNOSIS — I6529 Occlusion and stenosis of unspecified carotid artery: Secondary | ICD-10-CM | POA: Diagnosis not present

## 2021-07-21 DIAGNOSIS — J449 Chronic obstructive pulmonary disease, unspecified: Secondary | ICD-10-CM | POA: Diagnosis not present

## 2021-07-21 DIAGNOSIS — L0211 Cutaneous abscess of neck: Secondary | ICD-10-CM | POA: Diagnosis not present

## 2021-07-21 DIAGNOSIS — L0291 Cutaneous abscess, unspecified: Secondary | ICD-10-CM

## 2021-07-21 DIAGNOSIS — Z7982 Long term (current) use of aspirin: Secondary | ICD-10-CM | POA: Insufficient documentation

## 2021-07-21 DIAGNOSIS — L02811 Cutaneous abscess of head [any part, except face]: Secondary | ICD-10-CM | POA: Diagnosis not present

## 2021-07-21 DIAGNOSIS — R220 Localized swelling, mass and lump, head: Secondary | ICD-10-CM | POA: Diagnosis present

## 2021-07-21 LAB — I-STAT CHEM 8, ED
BUN: 9 mg/dL (ref 8–23)
Calcium, Ion: 1.14 mmol/L — ABNORMAL LOW (ref 1.15–1.40)
Chloride: 101 mmol/L (ref 98–111)
Creatinine, Ser: 0.8 mg/dL (ref 0.44–1.00)
Glucose, Bld: 109 mg/dL — ABNORMAL HIGH (ref 70–99)
HCT: 34 % — ABNORMAL LOW (ref 36.0–46.0)
Hemoglobin: 11.6 g/dL — ABNORMAL LOW (ref 12.0–15.0)
Potassium: 3.6 mmol/L (ref 3.5–5.1)
Sodium: 139 mmol/L (ref 135–145)
TCO2: 30 mmol/L (ref 22–32)

## 2021-07-21 MED ORDER — OXYCODONE HCL 5 MG PO TABS
5.0000 mg | ORAL_TABLET | Freq: Once | ORAL | Status: AC
  Administered 2021-07-21: 5 mg via ORAL
  Filled 2021-07-21: qty 1

## 2021-07-21 MED ORDER — DOXYCYCLINE HYCLATE 100 MG PO CAPS: 100.0000 mg | ORAL_CAPSULE | Freq: Two times a day (BID) | ORAL | 0 refills | Status: DC

## 2021-07-21 MED ORDER — IOHEXOL 300 MG/ML  SOLN
75.0000 mL | Freq: Once | INTRAMUSCULAR | Status: AC | PRN
  Administered 2021-07-21: 75 mL via INTRAVENOUS

## 2021-07-21 MED ORDER — LIDOCAINE-EPINEPHRINE (PF) 2 %-1:200000 IJ SOLN
20.0000 mL | Freq: Once | INTRAMUSCULAR | Status: AC
  Administered 2021-07-21: 20 mL via INTRADERMAL
  Filled 2021-07-21: qty 20

## 2021-07-21 NOTE — ED Triage Notes (Signed)
Started to feel a bump on the lower right back of head Friday and over the weekend noticed it has gotten worse and c/o of HA.  Draining pus small amount

## 2021-07-21 NOTE — ED Provider Notes (Signed)
[facility] EMERGENCY DEPARTMENT Provider Note   CSN: [id number] Arrival date & time: 07/21/21  [id number]   History Chief Complaint  Patient presents with   Abscess    GLYNNA MOHAMUD is a 75 y.o. female.  HPI  75 year old female with a past medical history of anxiety, hypertension, previous stroke, NSTEMI presenting to the emergency department with right-sided neck pain x4 days.  Patient reports that she noticed a bump to the back of her right-sided head around 4 days ago.  She states it is progressively worsened.  It has become more enlarged and more painful, sharp, radiating down her right shoulder and the right side of her neck.  She noticed a bump back there draining.  She denies any fevers, nausea, vomiting, diarrhea.  She denies any difficulty swallowing, but reports it is painful to turn her head.  She denies any difficulty hearing.  No history of similar symptoms.  Past Medical History:  Diagnosis Date   ABDOMINAL PAIN, RECURRENT 03/29/2008   ANXIETY 10/07/2007   Candidiasis of mouth 05/30/2010   GERD 07/25/2007   Hernia, hiatal    HYPERTENSION 07/25/2007   NSTEMI (non-ST elevated myocardial infarction) ([place]) 10/2017   Posterior vitreous detachment of left eye 09/05/2020   Stroke [facility])     Patient Active Problem List   Diagnosis Date Noted   Macular hole of left eye 10/17/2020   History of vitrectomy 09/05/2020   Jaw pain 05/14/2020   Greater trochanteric bursitis 04/04/2020   Muscle, jerky movements (uncontrolled) 02/11/2020   Hyperglycemia 11/08/2019   Migraine 09/15/2018   Breast cancer screening 09/15/2018   Polypharmacy 06/10/2018   COPD suggested by initial evaluation ([place]) 01/06/2018   Cough 10/28/2017   NSTEMI (non-ST elevated myocardial infarction) ([place]) 10/28/2017   Vertigo 08/10/2017   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   Osteoarthritis of spine with radiculopathy, cervical region 03/10/2017   Hypothyroidism 11/17/2016   Episode of  recurrent major depressive disorder ([place])    History of CVA (cerebrovascular accident)    Pure hypercholesterolemia 11/06/2016   Chronic venous insufficiency 11/05/2016   Peripheral neuropathy 11/05/2016   Late effects of CVA (cerebrovascular accident) 06/09/2016   Orthostatic hypotension 06/09/2016   Hypokalemia 02/20/2015   History of colonic polyps 06/04/2014   Viral URI with cough 08/29/2010   Thrush of mouth and esophagus ([place]) 05/30/2010   Anxiety state 10/07/2007   Essential hypertension 07/25/2007   GERD 07/25/2007    Past Surgical History:  Procedure Laterality Date   ABDOMINAL HYSTERECTOMY     BREAST SURGERY     bx   KNEE ARTHROSCOPY     LEFT HEART CATH AND CORONARY ANGIOGRAPHY N/A 11/02/2017   Procedure: LEFT HEART CATH AND CORONARY ANGIOGRAPHY;  Surgeon: Burnell Blanks, MD;  Location: [place] [facility];  Service: Cardiovascular;  Laterality: N/A;     OB History   No obstetric history on file.     Family History  Problem Relation Age of Onset   Dementia Mother    Alcoholism Mother    COPD Father    Diabetes Sister    Hypertension Sister    COPD Brother    Lung cancer Brother    Clotting disorder Brother    Kidney disease Neg Hx    Stroke Neg Hx     Social History   Tobacco Use   Smoking status: Never   Smokeless tobacco: Never  Vaping Use   Vaping Use: Never used  Substance Use Topics  Alcohol use: Yes    Comment: occasional   Drug use: No    Home Medications Prior to Admission medications   Medication Sig Start Date End Date Taking? Authorizing Provider  acetaminophen (TYLENOL) 500 MG tablet Take 1,000 mg by mouth every 6 (six) hours as needed for moderate pain.    [provider]  albuterol (VENTOLIN HFA) 108 (90 Base) MCG/ACT inhaler Inhale 2 puffs into the lungs every 6 (six) hours as needed for wheezing or shortness of breath. 03/04/21   Gifford Shave, MD  amLODipine (NORVASC) 10 MG tablet TAKE 1 TABLET BY MOUTH EVERY  DAY 08/19/20   Zenia Resides, MD  amoxicillin-clavulanate (AUGMENTIN) 875-125 MG tablet Take 1 tablet by mouth 2 (two) times daily. 03/04/21   Gifford Shave, MD  aspirin EC 81 MG tablet Take 81 mg by mouth every morning.     [provider]  atorvastatin (LIPITOR) 80 MG tablet Take 1 tablet (80 mg total) by mouth daily. 11/09/19   Zenia Resides, MD  carbamazepine (TEGRETOL) 200 MG tablet Take 0.5 tablets (100 mg total) by mouth 2 (two) times daily. 10/12/19   Zenia Resides, MD  diclofenac Sodium (VOLTAREN) 1 % GEL Apply 2 g topically 4 (four) times daily. 05/14/20   Bonnita Hollow, MD  FLUoxetine (PROZAC) 40 MG capsule TAKE 1 CAPSULE BY MOUTH EVERY DAY 05/13/20   Zenia Resides, MD  fluticasone (FLONASE) 50 MCG/ACT nasal spray Place 2 sprays into both nostrils daily. 02/06/20   Milus Banister C, DO  furosemide (LASIX) 20 MG tablet TAKE 1 TABLET BY MOUTH TWICE A DAY 12/30/20   Zenia Resides, MD  levothyroxine (SYNTHROID) 50 MCG tablet TAKE 1 TABLET BY MOUTH EVERY DAY BEFORE BREAKFAST 04/23/21   Zenia Resides, MD  losartan (COZAAR) 50 MG tablet Take 1 tablet (50 mg total) by mouth at bedtime. 11/08/19   Zenia Resides, MD  metoprolol tartrate (LOPRESSOR) 25 MG tablet TAKE 1 TABLET BY MOUTH TWICE DAILY 02/20/21   Zenia Resides, MD  nystatin (MYCOSTATIN) 100000 UNIT/ML suspension Take 5 mLs (500,000 Units total) by mouth 4 (four) times daily. 02/26/20   Zenia Resides, MD  ofloxacin (OCUFLOX) 0.3 % ophthalmic solution PLACE 1 DROP INTO THE LEFT EYE 4 TIMES DAILY FOR 10 DAYS. 11/06/20   Rankin, Clent Demark, MD  pantoprazole (PROTONIX) 40 MG tablet TAKE 1 TABLET BY MOUTH EVERY DAY 07/18/21   Zenia Resides, MD  potassium chloride SA (KLOR-CON) 20 MEQ tablet Take 1 tablet (20 mEq total) by mouth 2 (two) times daily. 03/08/21   Daleen Bo, MD  prednisoLONE acetate (PRED FORTE) 1 % ophthalmic suspension Place 1 drop into the left eye 4 (four) times daily. 10/09/20   Rankin,  Clent Demark, MD  QUEtiapine (SEROQUEL) 50 MG tablet TAKE 1 TABLET BY MOUTH EVERY MORNING AND 2 TABLET EACH NIGHT 06/18/20   Zenia Resides, MD    Allergies    Lisinopril, Codeine phosphate, and Morphine and related  Review of Systems   Review of Systems  Constitutional:  Negative for chills and fever.  HENT:  Negative for ear pain and sore throat.   Eyes:  Negative for pain and visual disturbance.  Respiratory:  Negative for cough and shortness of breath.   Cardiovascular:  Negative for chest pain and palpitations.  Gastrointestinal:  Negative for abdominal pain and vomiting.  Genitourinary:  Negative for dysuria and hematuria.  Musculoskeletal:  Positive for neck pain and neck stiffness.  Negative for arthralgias and back pain.  Skin:  Positive for wound. Negative for color change and rash.  Neurological:  Negative for seizures and syncope.  All other systems reviewed and are negative.  Physical Exam Updated Vital Signs BP (!) 182/89 (BP Location: Right Arm)   Pulse 77   Temp 99.3 F (37.4 C) (Oral)   Resp 18   Ht '5\' 4"'$  (1.626 m)   Wt 98.4 kg   SpO2 100%   BMI 37.25 kg/m   Physical Exam Vitals and nursing note reviewed.  Constitutional:      General: She is not in acute distress.    Appearance: Normal appearance. She is well-developed and normal weight.  HENT:     Head: Normocephalic and atraumatic.     Comments: Large posterior right inferior occipital abscess.  6 x 6 area of erythema, tenderness.  Central area of fluctuance that is actively draining purulence.  Tenderness and erythema tracking down to the patient's right mastoid and just inferior to this.  No point tenderness over the mastoid.  No ear proptosis.    Mouth/Throat:     Mouth: Mucous membranes are moist.     Pharynx: Oropharynx is clear. No oropharyngeal exudate or posterior oropharyngeal erythema.  Eyes:     Conjunctiva/sclera: Conjunctivae normal.  Neck:     Comments: Patient does not readily range her  neck, will turn her torso instead of her neck to follow the provider around the room Cardiovascular:     Rate and Rhythm: Normal rate and regular rhythm.     Heart sounds: No murmur heard. Pulmonary:     Effort: Pulmonary effort is normal. No respiratory distress.     Breath sounds: Normal breath sounds.  Abdominal:     Palpations: Abdomen is soft.     Tenderness: There is no abdominal tenderness.  Musculoskeletal:     Cervical back: Tenderness present.  Skin:    General: Skin is warm and dry.  Neurological:     Mental Status: She is alert.    ED Results / Procedures / Treatments   Labs (all labs ordered are listed, but only abnormal results are displayed) Labs Reviewed  I-STAT CHEM 8, ED    EKG None  Radiology No results found.  Procedures .Marland KitchenIncision and Drainage  Date/Time: 07/21/2021 3:21 PM Performed by: Claud Kelp, MD Authorized by: Deno Etienne, DO   Consent:    Consent obtained:  Verbal   Consent given by:  Patient   Risks discussed:  Bleeding, incomplete drainage, pain, damage to other organs and infection   Alternatives discussed:  Alternative treatment Universal protocol:    Procedure explained and questions answered to patient or proxy's satisfaction: yes     Patient identity confirmed:  Verbally with patient Location:    Type:  Abscess   Size:  4 x 4   Location:  Head   Head location:  Scalp Pre-procedure details:    Skin preparation:  Antiseptic wash Anesthesia:    Anesthesia method:  Local infiltration   Local anesthetic:  Lidocaine 2% WITH epi Procedure type:    Complexity:  Simple Procedure details:    Ultrasound guidance: no     Needle aspiration: no     Incision types:  Single straight   Incision depth:  Dermal   Wound management:  Probed and deloculated   Drainage:  Purulent and bloody   Drainage amount:  Moderate   Wound treatment:  Wound left open Post-procedure details:    Procedure  completion:  Procedure terminated at patient's  request Comments:     Incised and drained as above with significant return.  Patient did not tolerate this procedure very well.  On loculation disruption and irrigation, patient requested that the procedure be terminated.  I believe that a sufficient evacuation had been performed, so procedure terminated at patient request   Medications Ordered in ED Medications - No data to display  ED Course  I have reviewed the triage vital signs and the nursing notes.  Pertinent labs & imaging results that were available during my care of the patient were reviewed by me and considered in my medical decision making (see chart for details).    MDM Rules/Calculators/A&P                           75 year old female presenting with soft tissue swelling and redness to the right posterior occiput.  Vital signs reviewed, she is mildly hypertensive here.  Physical exam is notable for a large abscess to the right occiput.  She has some pain and erythema tracking down to over her right mastoid, but no significant bony tenderness over right mastoid.  Although the pretest probability of acute mastoiditis is low, will obtain a CT of the face to further stratify.  Also obtain a CT to rule out any deeper neck space abscesses such as RPA given her difficulty ranging her neck.  If these are negative, will drain the superficial abscess as it is certainly amenable to bedside drainage.  Should require antibiotics as well for surrounding cellulitis.  CT scan shows a superficial soft tissue abscess. Drained at bedside as above.  I believe that she is now stable for discharge from the emergency department.  Will prescribe doxycycline as an outpatient.  Patient is comfortable with this plan.  Strict return precautions discussed.  Final Clinical Impression(s) / ED Diagnoses Final diagnoses:  Abscess    Rx / DC Orders ED Discharge Orders     None        Claud Kelp, MD 07/21/21 [place], [place], DO 07/22/21  [phone]

## 2021-07-21 NOTE — ED Notes (Signed)
WITH PT permission I did call daughter and gave updated

## 2021-07-27 ENCOUNTER — Encounter: Payer: Self-pay | Admitting: Gastroenterology

## 2021-08-29 ENCOUNTER — Ambulatory Visit (INDEPENDENT_AMBULATORY_CARE_PROVIDER_SITE_OTHER): Payer: Medicare Other | Admitting: Family Medicine

## 2021-08-29 VITALS — BP 155/61 | HR 81

## 2021-08-29 DIAGNOSIS — Z1231 Encounter for screening mammogram for malignant neoplasm of breast: Secondary | ICD-10-CM

## 2021-08-29 DIAGNOSIS — I50810 Right heart failure, unspecified: Secondary | ICD-10-CM | POA: Diagnosis not present

## 2021-08-29 DIAGNOSIS — Z1211 Encounter for screening for malignant neoplasm of colon: Secondary | ICD-10-CM | POA: Diagnosis not present

## 2021-08-29 DIAGNOSIS — R609 Edema, unspecified: Secondary | ICD-10-CM

## 2021-08-29 DIAGNOSIS — D126 Benign neoplasm of colon, unspecified: Secondary | ICD-10-CM | POA: Diagnosis not present

## 2021-08-29 NOTE — Assessment & Plan Note (Addendum)
Believe her current dry cough, wheezing, and nocturnal orthopnea is due to CHF exacerbation. Weight today 217 lbs; patient unsure of her dry weight, but records indicate it is likely close to 205-210. She also has 1+ pitting edema to level of knee bilaterally. Last ECHO 10/28/2017 showed EF 60-65%, mild LVH. Erica Morris does not currently have a cardiologist to follow with; she reports she simply saw a cardiologist in the hospital at the time of diagnosis. Differential also includes COPD, asthma, and viral URI despite no formal respiratory diagnoses. COPD exacerbation less likely given lack of productive cough. Asthma possible although difficult to discern with patient's excessive use of albuterol inhaler. URI less likely because of lack of congestion, rhinorrhea, fever, sinus pressure.  - increase lasix from 20 mg to 40 mg daily - follow up in one week for weight and Cr check, scheduled for 10/7 - ECHO - refer to cardiology - refer to Dr. Valentina Lucks for PFT (COVID vaccinated x2), scheduled for 10/18

## 2021-08-29 NOTE — Patient Instructions (Addendum)
It was wonderful to meet you today. Thank you for allowing me to be a part of your care. Below is a short summary of what we discussed at your visit today:  Cough, wheezing - I think the couple weeks of worsened cough is from a heart failure exacerbation. This means an acute worsening of your heart failure - take two lasix pills (40 mg total) a day for one week - come back for appointment in one week to check your symptoms, weight, and kidney function labs   I have made you an appointment for Friday, October 7th at 10:50 am   Please call our office and reschedule if this day and time doesn't work for you [phone]) - we will aim for a weight of 210 pounds - I will send you for a heart scan called an ECHO. It is an ultrasound of your heart. We will get it approved by insurance and call you to schedule it.  - I will also send you to a heart doctor for close follow up. The cardiology office should be calling you directly to set up an appointment. Let us know if you don't hear from them in 2 weeks.   Lung function - go to your lung function testing on Tuesday Octber 18 at 11:15 am with Dr. Janeann Forehand - please call our office and reschedule if this day and time doesn't work for you [phone])  Health Maintenance We like to think about ways to keep you healthy for years to come. Below are some interventions and screenings we can offer to keep you healthy: - colonoscopy (GI office will call you directly to schedule) - mammogram (go to [facility], call them for appointment) - COVID  (available at clinic soon)  Please bring all of your medications to every appointment!  If you have any questions or concerns, please do not hesitate to contact us via phone or MyChart message.   Ezequiel Essex, MD

## 2021-08-29 NOTE — Assessment & Plan Note (Signed)
-   referred to GI for colonoscopy - ordered mammogram, gave instructions for scheduling

## 2021-08-29 NOTE — Progress Notes (Signed)
SUBJECTIVE:   CHIEF COMPLAINT / HPI:   Cough, wheezing - has not had pulmonary function testing done before - albuterol inhaler, patient reports taking this every 4 hours whether she has symptoms or not reportedly because she might have COPD - possibly have COPD or asthma, unsure - couple weeks duration - started after granddaughter came home with cough and didn't wear mask (not known to be diagnosed with COVID or flu) - previously had two COVID shots 02/29/20 and 03/25/20, no booster - started with sore throat then develop cough - coughs until she gags - dry cough, cannot get productive cough - cannot sleep at night, has to sit up in bed, needs two pillows to sleep - sleeping in recliner for cough, only when she's coughing so much that she is keeping her daughter awake - also having ears popping, itching - some muscle aches - denies fevers, chills, malaise - no personal history of smoking - daughter smokes in house where patient lives  History of HFpEF - unsure of her own dry weight  - last ECHO 10/28/2017 showed EF 60-65%, mild LVH - currently taking lasix 20 mg daily - cannot recall who the heart doctor is, does not follow up with one routinely - reports cardiologist only saw her while she was int he hospital  PERTINENT  PMH / PSH: HFpEF, HTN, orthostatic hypotension, NSTEMI, GERD, hypothyroidism, HLD  OBJECTIVE:   BP (!) 155/61   Pulse 81   SpO2 99%    PHQ-9:  Depression screen [facility] 2/9 03/17/2021 05/14/2020 04/04/2020  Decreased Interest 0 0 3  Down, Depressed, Hopeless 0 0 3  PHQ - 2 Score 0 0 6  Altered sleeping 1 - 0  Tired, decreased energy 1 - 1  Change in appetite 1 - 2  Feeling bad or failure about yourself  0 - (No Data)  Trouble concentrating 0 - 3  Moving slowly or fidgety/restless 0 - 3  Suicidal thoughts 0 - 3  PHQ-9 Score 3 - 18  Difficult doing work/chores - - -  Some recent data might be hidden     GAD-7:  GAD 7 : Generalized Anxiety Score  11/04/2017 10/07/2017  Nervous, Anxious, on Edge 3 3  Control/stop worrying 3 3  Worry too much - different things 3 2  Trouble relaxing 3 3  Restless 3 3  Easily annoyed or irritable 3 3  Afraid - awful might happen 3 3  Total GAD 7 Score 21 20  Anxiety Difficulty Extremely difficult -     Physical Exam General: Awake, alert, oriented, pressured speech, anxious affect Cardiovascular: Regular rate and rhythm, S1 and S2 present, no murmurs auscultated Respiratory: Lung fields clear to auscultation bilaterally Extremities: 1+ pitting edema to level of knee bilaterally  ASSESSMENT/PLAN:   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[place]) Believe her current dry cough, wheezing, and nocturnal orthopnea is due to CHF exacerbation. Weight today 217 lbs; patient unsure of her dry weight, but records indicate it is likely close to 205-210. She also has 1+ pitting edema to level of knee bilaterally. Last ECHO 10/28/2017 showed EF 60-65%, mild LVH. Erica Morris does not currently have a cardiologist to follow with; she reports she simply saw a cardiologist in the hospital at the time of diagnosis. Differential also includes COPD, asthma, and viral URI despite no formal respiratory diagnoses. COPD exacerbation less likely given lack of productive cough. Asthma possible although difficult to discern with patient's excessive use of albuterol inhaler. URI less  likely because of lack of congestion, rhinorrhea, fever, sinus pressure.  - increase lasix from 20 mg to 40 mg daily - follow up in one week for weight and Cr check, scheduled for 10/7 - ECHO - refer to cardiology - refer to Dr. Valentina Lucks for PFT (COVID vaccinated x2), scheduled for 10/18  Healthcare maintenance - referred to GI for colonoscopy - ordered mammogram, gave instructions for scheduling    Erica Essex, MD [place]

## 2021-09-04 NOTE — Progress Notes (Deleted)
    SUBJECTIVE:   CHIEF COMPLAINT / HPI: CHF follow-up  Most recently seen by Dr. Jeani Hawking 9/30.  Furosemide increased from 20 mg to 40 mg.  Referred to cardiology.  Here for weight check and creatinine check.  Dry weight is likely close to 205 to 210 pounds per records.  PERTINENT  PMH / PSH: HFpEF, HTN  OBJECTIVE:   There were no vitals taken for this visit.  General: ***, NAD CV: RRR, no murmurs*** Pulm: CTAB, no wheezes or rales  ASSESSMENT/PLAN:   No problem-specific Assessment & Plan notes found for this encounter.     Zola Button, MD [place]   {    This will disappear when note is signed, click to select method of visit    :1}

## 2021-09-05 ENCOUNTER — Ambulatory Visit: Payer: Medicare Other

## 2021-09-08 ENCOUNTER — Telehealth: Payer: Self-pay

## 2021-09-08 NOTE — Telephone Encounter (Signed)
Patient calls nurse line reporting continued cough. Patient reports she has been taking lasix 40mg  as prescribed, however can not sleep at night due to lingering cough. Patient missed followup apt due to transportation. Patient has an upcoming apt with Koval for PF on 10/18. Patient is requesting something else in the meantime to manage her cough. Will forward to provider who saw patient.

## 2021-09-10 NOTE — Telephone Encounter (Signed)
Attempted to call patient to schedule with PCP. However, no answer or option for VM. If patient calls back please schedule with PCP next week.

## 2021-09-16 ENCOUNTER — Ambulatory Visit (INDEPENDENT_AMBULATORY_CARE_PROVIDER_SITE_OTHER): Payer: Medicare Other | Admitting: Pharmacist

## 2021-09-16 ENCOUNTER — Encounter: Payer: Self-pay | Admitting: Pharmacist

## 2021-09-16 ENCOUNTER — Other Ambulatory Visit: Payer: Self-pay

## 2021-09-16 VITALS — BP 162/70 | HR 57 | Ht 64.0 in | Wt 207.0 lb

## 2021-09-16 DIAGNOSIS — E039 Hypothyroidism, unspecified: Secondary | ICD-10-CM

## 2021-09-16 DIAGNOSIS — J449 Chronic obstructive pulmonary disease, unspecified: Secondary | ICD-10-CM

## 2021-09-16 DIAGNOSIS — I6990 Unspecified sequelae of unspecified cerebrovascular disease: Secondary | ICD-10-CM | POA: Diagnosis not present

## 2021-09-16 DIAGNOSIS — F339 Major depressive disorder, recurrent, unspecified: Secondary | ICD-10-CM

## 2021-09-16 DIAGNOSIS — R053 Chronic cough: Secondary | ICD-10-CM

## 2021-09-16 DIAGNOSIS — I50810 Right heart failure, unspecified: Secondary | ICD-10-CM | POA: Diagnosis not present

## 2021-09-16 DIAGNOSIS — I5032 Chronic diastolic (congestive) heart failure: Secondary | ICD-10-CM | POA: Diagnosis not present

## 2021-09-16 DIAGNOSIS — F410 Panic disorder [episodic paroxysmal anxiety] without agoraphobia: Secondary | ICD-10-CM

## 2021-09-16 DIAGNOSIS — Z23 Encounter for immunization: Secondary | ICD-10-CM

## 2021-09-16 DIAGNOSIS — G43009 Migraine without aura, not intractable, without status migrainosus: Secondary | ICD-10-CM | POA: Diagnosis not present

## 2021-09-16 DIAGNOSIS — I10 Essential (primary) hypertension: Secondary | ICD-10-CM

## 2021-09-16 MED ORDER — CARBAMAZEPINE 200 MG PO TABS: 100.0000 mg | ORAL_TABLET | Freq: Two times a day (BID) | ORAL | 3 refills | Status: DC

## 2021-09-16 MED ORDER — FLUOXETINE HCL 40 MG PO CAPS: 40.0000 mg | ORAL_CAPSULE | Freq: Every day | ORAL | 3 refills | Status: DC

## 2021-09-16 MED ORDER — METOPROLOL TARTRATE 25 MG PO TABS: 25.0000 mg | ORAL_TABLET | Freq: Two times a day (BID) | ORAL | 11 refills | Status: DC

## 2021-09-16 MED ORDER — LOSARTAN POTASSIUM 50 MG PO TABS: 50.0000 mg | ORAL_TABLET | Freq: Every day | ORAL | 3 refills | Status: DC

## 2021-09-16 MED ORDER — FLUTICASONE PROPIONATE 50 MCG/ACT NA SUSP: 2.0000 | Freq: Every day | NASAL | 6 refills | Status: DC

## 2021-09-16 MED ORDER — ATORVASTATIN CALCIUM 80 MG PO TABS: 80.0000 mg | ORAL_TABLET | Freq: Every day | ORAL | 3 refills | Status: DC

## 2021-09-16 MED ORDER — TRELEGY ELLIPTA 100-62.5-25 MCG/INH IN AEPB: 1.0000 | INHALATION_SPRAY | Freq: Every day | RESPIRATORY_TRACT | 0 refills | Status: DC

## 2021-09-16 MED ORDER — AMLODIPINE BESYLATE 10 MG PO TABS: 10.0000 mg | ORAL_TABLET | Freq: Every day | ORAL | 3 refills | Status: DC

## 2021-09-16 MED ORDER — TRELEGY ELLIPTA 100-62.5-25 MCG/INH IN AEPB: 1.0000 | INHALATION_SPRAY | Freq: Every day | RESPIRATORY_TRACT | 11 refills | Status: DC

## 2021-09-16 MED ORDER — QUETIAPINE FUMARATE 50 MG PO TABS: 50.0000 mg | ORAL_TABLET | Freq: Two times a day (BID) | ORAL | 1 refills | Status: DC

## 2021-09-16 MED ORDER — LEVOTHYROXINE SODIUM 50 MCG PO TABS: 50.0000 ug | ORAL_TABLET | Freq: Every day | ORAL | 2 refills | Status: DC

## 2021-09-16 MED ORDER — POTASSIUM CHLORIDE CRYS ER 20 MEQ PO TBCR
20.0000 meq | EXTENDED_RELEASE_TABLET | Freq: Two times a day (BID) | ORAL | 0 refills | Status: DC
Start: 2021-09-16 — End: 2021-10-03

## 2021-09-16 NOTE — Progress Notes (Signed)
Reviewed: I agree with Dr. Koval's documentation and management. 

## 2021-09-16 NOTE — Assessment & Plan Note (Signed)
>>  ASSESSMENT AND PLAN FOR COPD SUGGESTED BY INITIAL EVALUATION (HCC) WRITTEN ON 09/16/2021  2:16 PM BY KOVAL, PETER G, RPH-CPP  Patient has been experiencing cough for several months and dypnea with exertion or years.  She reports taking albuterol multiple time per day. Spirometry with pre- and post-bronchodilator valuation reveals diminished FVC and FEV compared to expected with lung age estimated at 90 years. Post nebulized albuterol tx revealed reversibility of 18% (with subjective improvement in airflow. 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 Discussed plan with Dr. Leveda Anna.  -Initiated Trelegy Ellipta 1 inhalation daily.    -Educated patient on purpose, proper use, potential adverse effects including risk of esophageal candidiasis and need to rinse mouth after each use.  Sample provided.  -Reviewed results of pulmonary function tests.  Pt verbalized understanding of results and education.

## 2021-09-16 NOTE — Progress Notes (Signed)
S:    Patient arrives in a pleasant mood, ambulating without assistance. Accompanied by her daughter-in-law Joseph Art.  Presents for lung function evaluation.    Patient was referred by Dr. Jeani Hawking 08/29/2021 and last seen by Primary Care Provider, Dr. Andria Frames > 1 year ago (03/2020).   Patient reports Dyspnes and concern for Asthma or COPD  diagnosis in the past, but has never been evaluated with pulmonary function testing.  She reports taking albuterol three times daily due to shortness of breath.   Never smoker, but long-term second hand smoke exposure from family members and worked in [employer] without a mask for many years, she denies ever being required to wear a mask but reports exposure to lung irritants.   Patient also reports persistent and bothersome cough since June with follow-up in September.  She reports minimal improvement with OTC cough medications.  Deferred to Dr. Andria Frames for evaluation of lungs/cough as he was available as a Child psychotherapist today.  Lung evaluation per Dr. Andria Frames. Placed order for chest X-ray.   Reports using albuterol 3x/day. Denies any other inhalers.   Patient reports breathing has been problematic for years but worse recently with onset of cough in June. .   Medication adherence reported as poor due to running out of many maintenance medications "months ago".  Patient reports last dose of albuterol was approximately 3 hours prior.   O: Physical Exam Constitutional:      Appearance: She is obese.  Pulmonary:     Effort: Pulmonary effort is normal.  Neurological:     Mental Status: She is alert.    Review of Systems  Respiratory:  Positive for cough and shortness of breath.   Psychiatric/Behavioral:  The patient is nervous/anxious.   All other systems reviewed and are negative.  Vitals:   09/16/21 1208  BP: (!) 162/70  Pulse: (!) 57  SpO2: 97%    mMRC score= 2  See "scanned report" or Documentation Flowsheet (discrete results - PFTs) for Spirometry  results. Patient provided good effort while attempting spirometry.   Lung Age = 74 YO Albuterol Neb  Lot# [id number]     Exp. 02/27/23   A/P: Patient has been experiencing cough for several months and dypnea with exertion or years.  She reports taking albuterol multiple time per day. Spirometry with pre- and post-bronchodilator valuation reveals diminished FVC and FEV compared to expected with lung age estimated at 77 years. Post nebulized albuterol tx revealed reversibility of 18% (with subjective improvement in airflow. 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 Discussed plan with Dr. Andria Frames.  - Initiated Trelegy Ellipta 1 inhalation daily.     -Educated patient on purpose, proper use, potential adverse effects including risk of esophageal candidiasis and need to rinse mouth after each use.  Sample provided.  -Reviewed results of pulmonary function tests.  Pt verbalized understanding of results and education.    Elevated blood pressure in office today.  Patient reports being out of all maintenance medications for several months. She requested refills of all be sent to a new pharmacy as she is moving in with her daughter-in-law.  She has a PCP follow-up with Dr. Andria Frames in 6 days. I provided refills of chronic maintenance medications for her to re-start prior to PCP visit. Re-evaluate blood pressure at that time.   Patient received flu vaccine and covid booster vaccine in office today.    Written pt instructions provided.     Total time in face to face counseling 80 minutes.  Patient seen with Elyse Jarvis, PharmD Candidate. Marland Kitchen

## 2021-09-16 NOTE — Patient Instructions (Signed)
Nice to meet you today.   Your breathing test showed some increase in your lung age.  You also had reversibility after the medication treatment.   Please start taking the Trelegy Ellipta once daily as you demonstrated in office.  Follow-up  with Dr. Andria Frames within the next 3 months.

## 2021-09-16 NOTE — Assessment & Plan Note (Signed)
Patient has been experiencing cough for several months and dypnea with exertion or years.  She reports taking albuterol multiple time per day. Spirometry with pre- and post-bronchodilator valuation reveals diminished FVC and FEV compared to expected with lung age estimated at 77 years. Post nebulized albuterol tx revealed reversibility of 18% (with subjective improvement in airflow. 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 Discussed plan with Dr. Andria Frames.  -Initiated Trelegy Ellipta 1 inhalation daily.    -Educated patient on purpose, proper use, potential adverse effects including risk of esophageal candidiasis and need to rinse mouth after each use.  Sample provided.  -Reviewed results of pulmonary function tests.  Pt verbalized understanding of results and education.

## 2021-09-19 ENCOUNTER — Other Ambulatory Visit: Payer: Self-pay

## 2021-09-19 ENCOUNTER — Ambulatory Visit (INDEPENDENT_AMBULATORY_CARE_PROVIDER_SITE_OTHER): Payer: Medicare Other

## 2021-09-19 DIAGNOSIS — Z23 Encounter for immunization: Secondary | ICD-10-CM

## 2021-09-22 ENCOUNTER — Ambulatory Visit: Payer: Medicare Other | Admitting: Family Medicine

## 2021-10-03 ENCOUNTER — Ambulatory Visit (INDEPENDENT_AMBULATORY_CARE_PROVIDER_SITE_OTHER): Payer: Medicare Other | Admitting: Student

## 2021-10-03 ENCOUNTER — Other Ambulatory Visit: Payer: Self-pay

## 2021-10-03 ENCOUNTER — Encounter: Payer: Self-pay | Admitting: Student

## 2021-10-03 VITALS — BP 139/59 | HR 64 | Ht 64.0 in | Wt 204.1 lb

## 2021-10-03 DIAGNOSIS — K219 Gastro-esophageal reflux disease without esophagitis: Secondary | ICD-10-CM

## 2021-10-03 DIAGNOSIS — I6990 Unspecified sequelae of unspecified cerebrovascular disease: Secondary | ICD-10-CM | POA: Diagnosis not present

## 2021-10-03 DIAGNOSIS — J449 Chronic obstructive pulmonary disease, unspecified: Secondary | ICD-10-CM | POA: Diagnosis not present

## 2021-10-03 DIAGNOSIS — R6884 Jaw pain: Secondary | ICD-10-CM | POA: Diagnosis not present

## 2021-10-03 DIAGNOSIS — G43009 Migraine without aura, not intractable, without status migrainosus: Secondary | ICD-10-CM | POA: Diagnosis not present

## 2021-10-03 DIAGNOSIS — R609 Edema, unspecified: Secondary | ICD-10-CM | POA: Diagnosis not present

## 2021-10-03 DIAGNOSIS — B370 Candidal stomatitis: Secondary | ICD-10-CM

## 2021-10-03 DIAGNOSIS — E876 Hypokalemia: Secondary | ICD-10-CM | POA: Diagnosis not present

## 2021-10-03 DIAGNOSIS — F410 Panic disorder [episodic paroxysmal anxiety] without agoraphobia: Secondary | ICD-10-CM

## 2021-10-03 DIAGNOSIS — I50810 Right heart failure, unspecified: Secondary | ICD-10-CM

## 2021-10-03 DIAGNOSIS — I5032 Chronic diastolic (congestive) heart failure: Secondary | ICD-10-CM | POA: Diagnosis not present

## 2021-10-03 DIAGNOSIS — Z9114 Patient's other noncompliance with medication regimen: Secondary | ICD-10-CM

## 2021-10-03 DIAGNOSIS — I10 Essential (primary) hypertension: Secondary | ICD-10-CM

## 2021-10-03 DIAGNOSIS — E039 Hypothyroidism, unspecified: Secondary | ICD-10-CM

## 2021-10-03 DIAGNOSIS — Z91148 Patient's other noncompliance with medication regimen for other reason: Secondary | ICD-10-CM | POA: Insufficient documentation

## 2021-10-03 DIAGNOSIS — B3781 Candidal esophagitis: Secondary | ICD-10-CM

## 2021-10-03 DIAGNOSIS — F339 Major depressive disorder, recurrent, unspecified: Secondary | ICD-10-CM

## 2021-10-03 MED ORDER — ALBUTEROL SULFATE HFA 108 (90 BASE) MCG/ACT IN AERS: 2.0000 | INHALATION_SPRAY | Freq: Four times a day (QID) | RESPIRATORY_TRACT | 6 refills | Status: DC | PRN

## 2021-10-03 MED ORDER — POTASSIUM CHLORIDE CRYS ER 20 MEQ PO TBCR
20.0000 meq | EXTENDED_RELEASE_TABLET | Freq: Two times a day (BID) | ORAL | 0 refills | Status: DC
Start: 2021-10-03 — End: 2021-10-13

## 2021-10-03 MED ORDER — QUETIAPINE FUMARATE 50 MG PO TABS: 50.0000 mg | ORAL_TABLET | Freq: Two times a day (BID) | ORAL | 1 refills | Status: DC

## 2021-10-03 MED ORDER — AMLODIPINE BESYLATE 10 MG PO TABS: 10.0000 mg | ORAL_TABLET | Freq: Every day | ORAL | 3 refills | Status: DC

## 2021-10-03 MED ORDER — LOSARTAN POTASSIUM 50 MG PO TABS: 50.0000 mg | ORAL_TABLET | Freq: Every day | ORAL | 3 refills | Status: DC

## 2021-10-03 MED ORDER — METOPROLOL TARTRATE 25 MG PO TABS: 25.0000 mg | ORAL_TABLET | Freq: Two times a day (BID) | ORAL | 11 refills | Status: DC

## 2021-10-03 MED ORDER — FUROSEMIDE 20 MG PO TABS: 40.0000 mg | ORAL_TABLET | Freq: Two times a day (BID) | ORAL | 3 refills | Status: DC

## 2021-10-03 MED ORDER — NYSTATIN 100000 UNIT/ML MT SUSP
5.0000 mL | Freq: Four times a day (QID) | OROMUCOSAL | 0 refills | Status: DC
Start: 2021-10-03 — End: 2022-05-13

## 2021-10-03 MED ORDER — FLUTICASONE PROPIONATE 50 MCG/ACT NA SUSP: 2.0000 | Freq: Every day | NASAL | 6 refills | Status: DC

## 2021-10-03 MED ORDER — CARBAMAZEPINE 200 MG PO TABS: 100.0000 mg | ORAL_TABLET | Freq: Two times a day (BID) | ORAL | 3 refills | Status: DC

## 2021-10-03 MED ORDER — DICLOFENAC SODIUM 1 % EX GEL: 2.0000 g | Freq: Four times a day (QID) | CUTANEOUS | 1 refills | Status: DC

## 2021-10-03 MED ORDER — TRELEGY ELLIPTA 100-62.5-25 MCG/ACT IN AEPB: 1.0000 | INHALATION_SPRAY | Freq: Every day | RESPIRATORY_TRACT | 0 refills | Status: DC

## 2021-10-03 MED ORDER — ATORVASTATIN CALCIUM 80 MG PO TABS: 80.0000 mg | ORAL_TABLET | Freq: Every day | ORAL | 3 refills | Status: DC

## 2021-10-03 MED ORDER — LEVOTHYROXINE SODIUM 50 MCG PO TABS
50.0000 ug | ORAL_TABLET | Freq: Every day | ORAL | 2 refills | Status: DC
Start: 2021-10-03 — End: 2021-10-31

## 2021-10-03 MED ORDER — FLUOXETINE HCL 40 MG PO CAPS
40.0000 mg | ORAL_CAPSULE | Freq: Every day | ORAL | 3 refills | Status: DC
Start: 2021-10-03 — End: 2022-10-26

## 2021-10-03 MED ORDER — PANTOPRAZOLE SODIUM 40 MG PO TBEC: 40.0000 mg | DELAYED_RELEASE_TABLET | Freq: Every day | ORAL | 1 refills | Status: DC

## 2021-10-03 NOTE — Patient Instructions (Signed)
Erica Morris,  It is such a joy to take care you! Thank you for coming in today.   As a reminder, here is a recap of what we talked about today:  - I am refilling all of your medications [facility] [address] Please pick these up and start taking them all as prescribed. I want you to follow up with Dr. Andria Frames as soon as possible to make sure that you are doing okay on all of these medications.   We are checking some labs today. I will call you if they are abnormal. I will send you a MyChart message or a letter if they are normal.  If you do not hear about your labs in the next 2 weeks please let us know.  I recommend that you always bring your medications to each appointment as this makes it easy to ensure we are on the correct medications and helps [place] not miss when refills are needed.  Take care and seek immediate care sooner if you develop any concerns.   Marnee Guarneri, MD [place]

## 2021-10-03 NOTE — Progress Notes (Signed)
    SUBJECTIVE:   CHIEF COMPLAINT / HPI:   Medication Management: Ms. Wooden reports being out of all of her medications since October 14.  She says that she attempted to have them refilled but that they were sent to the wrong pharmacy.  She has called around to both the [facility] where she [facility] [address] to try to have them transferred but has had no luck.  She saw Dr. Valentina Lucks for PFTs last month and was started on Trelegy however she says that her inhaler only lasted her 2 days and then ran out so she has not taken this either.  She did not take it back to the pharmacy to discuss with the pharmacist.  She denies any chest pain, shortness of breath, palpitations but does endorse an increased resting tremor and leg swelling.  PERTINENT  PMH / PSH: HTN, CHF, COPD, hx CVA, hx of NSTEMI, Hypokalemia, Depression  OBJECTIVE:   BP (!) 139/59   Pulse 64   Ht 5\' 4"  (1.626 m)   Wt 204 lb 2 oz (92.6 kg)   SpO2 100%   BMI 35.04 kg/m   Physical Exam Vitals reviewed.  Constitutional:      General: She is not in acute distress.    Appearance: She is obese.  Cardiovascular:     Rate and Rhythm: Normal rate and regular rhythm.     Pulses:          Dorsalis pedis pulses are 2+ on the right side and 2+ on the left side.       Posterior tibial pulses are 2+ on the right side and 2+ on the left side.  Pulmonary:     Effort: Pulmonary effort is normal. No respiratory distress.     Breath sounds: Normal breath sounds and air entry. No wheezing, rhonchi or rales.  Musculoskeletal:     Right lower leg: No tenderness. 2+ Pitting Edema present.     Left lower leg: No tenderness. 2+ Pitting Edema present.  Skin:    General: Skin is warm and dry.  Neurological:     Mental Status: She is alert.     Comments: Fine resting tremor of bilateral hands     ASSESSMENT/PLAN:   Nonadherence to medication Patient has not taken any medication since 10/19 due to apparent  issues in transferring prescriptions from her past pharmacy [facility]) [facility] [address] No evidence of COPD exacerbation on physical exam, but patient is frankly volume overloaded with bilateral LE pitting edema. Note that her dry weight is listed as 210lbs but comes in today at 204lb. Consider that her listed dry weight may need to be revised. Patient does not know her dry weight. Hypertensive today, but not surprising given that she has been off all meds.  - Refilled all medications today - F/u with PCP in 2 weeks - Will check a BMP today     Pearla Dubonnet, MD [place]

## 2021-10-03 NOTE — Assessment & Plan Note (Addendum)
Patient has not taken any medication since 10/19 due to apparent issues in transferring prescriptions from her past pharmacy [facility]) [facility] [address] No evidence of COPD exacerbation on physical exam, but patient is frankly volume overloaded with bilateral LE pitting edema. Note that her dry weight is listed as 210lbs but comes in today at 204lb. Consider that her listed dry weight may need to be revised. Patient does not know her dry weight. Hypertensive today, but not surprising given that she has been off all meds.  - Refilled all medications today - F/u with PCP in 2 weeks - Will check a BMP today

## 2021-10-04 LAB — BASIC METABOLIC PANEL
BUN/Creatinine Ratio: 15 (ref 12–28)
BUN: 22 mg/dL (ref 8–27)
CO2: 24 mmol/L (ref 20–29)
Calcium: 9.9 mg/dL (ref 8.7–10.3)
Chloride: 102 mmol/L (ref 96–106)
Creatinine, Ser: 1.45 mg/dL — ABNORMAL HIGH (ref 0.57–1.00)
Glucose: 105 mg/dL — ABNORMAL HIGH (ref 70–99)
Potassium: 4.9 mmol/L (ref 3.5–5.2)
Sodium: 141 mmol/L (ref 134–144)
eGFR: 38 mL/min/{1.73_m2} — ABNORMAL LOW (ref >59)

## 2021-10-12 ENCOUNTER — Other Ambulatory Visit: Payer: Self-pay | Admitting: Student

## 2021-10-12 DIAGNOSIS — E876 Hypokalemia: Secondary | ICD-10-CM

## 2021-10-17 ENCOUNTER — Telehealth: Payer: Self-pay

## 2021-10-17 NOTE — Telephone Encounter (Signed)
Patient calls nurse line requesting to speak with Dr. Joelyn Oms regarding results from visit on 11/4.  Please return call to patient at [phone].  Talbot Grumbling, RN

## 2021-10-17 NOTE — Telephone Encounter (Signed)
Called the pt to discuss the results. Elevated Cr-pt has not experienced any vomiting but has had diarrhea recently. She is currently taking all of her medications, and her Lasix seemed to be working well. She does not drink often and notes that she could be dehydrated. Currently, she is not taking any NSAIDs. Mild injury, will follow up on creatinine. I have scheduled an appt with Dr. Adah Salvage on 10/31/21. At that time, we can check creatinine again with repeat BMP. If creatinine is still high, can consider obtaining renal U/S, UA and sediment, and alb to creatinine ratio.

## 2021-10-20 ENCOUNTER — Ambulatory Visit (HOSPITAL_COMMUNITY): Payer: Medicare Other

## 2021-10-31 ENCOUNTER — Ambulatory Visit (INDEPENDENT_AMBULATORY_CARE_PROVIDER_SITE_OTHER): Payer: Medicare Other | Admitting: Student

## 2021-10-31 ENCOUNTER — Other Ambulatory Visit: Payer: Self-pay

## 2021-10-31 VITALS — BP 121/83 | HR 55 | Wt 205.6 lb

## 2021-10-31 DIAGNOSIS — E039 Hypothyroidism, unspecified: Secondary | ICD-10-CM

## 2021-10-31 DIAGNOSIS — E876 Hypokalemia: Secondary | ICD-10-CM

## 2021-10-31 DIAGNOSIS — F339 Major depressive disorder, recurrent, unspecified: Secondary | ICD-10-CM

## 2021-10-31 DIAGNOSIS — N179 Acute kidney failure, unspecified: Secondary | ICD-10-CM

## 2021-10-31 DIAGNOSIS — I5032 Chronic diastolic (congestive) heart failure: Secondary | ICD-10-CM

## 2021-10-31 DIAGNOSIS — J449 Chronic obstructive pulmonary disease, unspecified: Secondary | ICD-10-CM | POA: Diagnosis not present

## 2021-10-31 MED ORDER — QUETIAPINE FUMARATE 50 MG PO TABS
50.0000 mg | ORAL_TABLET | Freq: Two times a day (BID) | ORAL | 1 refills | Status: DC
Start: 2021-10-31 — End: 2022-04-20

## 2021-10-31 MED ORDER — LEVOTHYROXINE SODIUM 50 MCG PO TABS: 50.0000 ug | ORAL_TABLET | Freq: Every day | ORAL | 2 refills | Status: DC

## 2021-10-31 MED ORDER — TRELEGY ELLIPTA 100-62.5-25 MCG/ACT IN AEPB: 1.0000 | INHALATION_SPRAY | Freq: Every day | RESPIRATORY_TRACT | 0 refills | Status: DC

## 2021-10-31 MED ORDER — FLUTICASONE PROPIONATE 50 MCG/ACT NA SUSP: 2.0000 | Freq: Every day | NASAL | 6 refills | Status: DC

## 2021-10-31 MED ORDER — POTASSIUM CHLORIDE CRYS ER 20 MEQ PO TBCR
20.0000 meq | EXTENDED_RELEASE_TABLET | Freq: Two times a day (BID) | ORAL | 3 refills | Status: DC
Start: 2021-10-31 — End: 2022-10-19

## 2021-10-31 MED ORDER — METOPROLOL TARTRATE 25 MG PO TABS: 25.0000 mg | ORAL_TABLET | Freq: Two times a day (BID) | ORAL | 5 refills | Status: DC

## 2021-10-31 NOTE — Progress Notes (Addendum)
    SUBJECTIVE:   CHIEF COMPLAINT / HPI:   [name] is a 75 year old female who presents today for follow-up for elevated creatinine.  Patient said prior to her last visit she had 2 to 3 days of diarrhea which she attributes to honeydew.  She reports normal bowel movements since discontinuing honeydew doses drinking mostly soda and rarely water.  Last visit patient says she had bilateral lower extremity edema.  She denies any recent changes in medications.  She is unaware of having any history of elevated creatinine.  Today she says she feels fine and has no complaint and would like to get refills on her medications.  PERTINENT  PMH / PSH: HTN, NSTEMI, CHF  OBJECTIVE:   BP 121/83   Pulse (!) 55   Wt 93.3 kg   SpO2 98%   BMI 35.29 kg/m     Physical Exam General: Alert, well appearing, NAD,  Cardiovascular: RRR, No Murmurs, Normal S2/S2 Respiratory: CTAB, No wheezing or Rales or Crackles Abdomen: No distension or tenderness Extremities: No edema on extremities   Skin: Warm and dry  ASSESSMENT/PLAN:  Patient presents for recheck of creatinine.  Last creatinine was 1.45 last visit, baseline creatinine is at 1.0.  Currently on aspirin, lasix, and losartan. No new changes with her medication. Recent history of diarrhea and bilateral lowe extremity edema could have attributed to her recent elevated creatinine. On exam today no crackles on auscultation or  BLE lower extremity edema. Will obtain BMP today to reassess kidney function.   Refilled patient's medications.      Erica Bleacher, MD [place]

## 2021-10-31 NOTE — Patient Instructions (Signed)
It was wonderful to MEET you today. Thank you for allowing me to be a part of your care. Below is a short summary of what we discussed at your visit today:  Rechecking your creatinine today   Refill your medication today   If you have any questions or concerns, please do not hesitate to contact us via phone or MyChart message.   Alen Bleacher, MD [facility]

## 2021-11-01 LAB — BASIC METABOLIC PANEL
BUN/Creatinine Ratio: 12 (ref 12–28)
BUN: 17 mg/dL (ref 8–27)
CO2: 26 mmol/L (ref 20–29)
Calcium: 9.2 mg/dL (ref 8.7–10.3)
Chloride: 102 mmol/L (ref 96–106)
Creatinine, Ser: 1.37 mg/dL — ABNORMAL HIGH (ref 0.57–1.00)
Glucose: 115 mg/dL — ABNORMAL HIGH (ref 70–99)
Potassium: 5.7 mmol/L — ABNORMAL HIGH (ref 3.5–5.2)
Sodium: 143 mmol/L (ref 134–144)
eGFR: 41 mL/min/{1.73_m2} — ABNORMAL LOW (ref >59)

## 2021-11-05 ENCOUNTER — Telehealth: Payer: Self-pay | Admitting: Student

## 2021-11-05 ENCOUNTER — Encounter: Payer: Self-pay | Admitting: Student

## 2021-11-05 DIAGNOSIS — E875 Hyperkalemia: Secondary | ICD-10-CM

## 2021-11-05 NOTE — Telephone Encounter (Signed)
Left patient a second voicemail to call back regarding her labs.

## 2021-11-05 NOTE — Addendum Note (Signed)
Addended by: Benita Gutter on: 11/05/2021 11:40 AM   Modules accepted: Orders

## 2021-11-05 NOTE — Telephone Encounter (Signed)
Left generic callback voicemail for patient to call concerning her potassium level.  If patient calls back please inform her to stop her potassium chloride and come back to the clinic to check her potassium level.

## 2021-11-13 NOTE — Telephone Encounter (Signed)
Pt returned call to RN VM.  Called back no answer, lmovm Erica Morris, CMA

## 2022-02-04 ENCOUNTER — Other Ambulatory Visit: Payer: Self-pay | Admitting: Student

## 2022-02-04 DIAGNOSIS — J449 Chronic obstructive pulmonary disease, unspecified: Secondary | ICD-10-CM

## 2022-04-05 ENCOUNTER — Other Ambulatory Visit: Payer: Self-pay | Admitting: Student

## 2022-04-05 DIAGNOSIS — I50810 Right heart failure, unspecified: Secondary | ICD-10-CM

## 2022-04-05 DIAGNOSIS — I5032 Chronic diastolic (congestive) heart failure: Secondary | ICD-10-CM

## 2022-04-19 ENCOUNTER — Other Ambulatory Visit: Payer: Self-pay | Admitting: Family Medicine

## 2022-04-19 DIAGNOSIS — F339 Major depressive disorder, recurrent, unspecified: Secondary | ICD-10-CM

## 2022-05-06 ENCOUNTER — Ambulatory Visit (INDEPENDENT_AMBULATORY_CARE_PROVIDER_SITE_OTHER): Payer: Medicare Other | Admitting: Family Medicine

## 2022-05-06 VITALS — BP 112/53 | HR 66 | Temp 99.0°F | Ht 64.0 in | Wt 201.0 lb

## 2022-05-06 DIAGNOSIS — R202 Paresthesia of skin: Secondary | ICD-10-CM | POA: Insufficient documentation

## 2022-05-06 DIAGNOSIS — E875 Hyperkalemia: Secondary | ICD-10-CM | POA: Diagnosis not present

## 2022-05-06 DIAGNOSIS — R5383 Other fatigue: Secondary | ICD-10-CM

## 2022-05-06 DIAGNOSIS — I10 Essential (primary) hypertension: Secondary | ICD-10-CM

## 2022-05-06 DIAGNOSIS — J069 Acute upper respiratory infection, unspecified: Secondary | ICD-10-CM

## 2022-05-06 DIAGNOSIS — J449 Chronic obstructive pulmonary disease, unspecified: Secondary | ICD-10-CM | POA: Diagnosis not present

## 2022-05-06 DIAGNOSIS — I6990 Unspecified sequelae of unspecified cerebrovascular disease: Secondary | ICD-10-CM

## 2022-05-06 MED ORDER — TRELEGY ELLIPTA 100-62.5-25 MCG/ACT IN AEPB: 1.0000 | INHALATION_SPRAY | Freq: Every day | RESPIRATORY_TRACT | 6 refills | Status: DC

## 2022-05-06 MED ORDER — ATORVASTATIN CALCIUM 80 MG PO TABS: 80.0000 mg | ORAL_TABLET | Freq: Every day | ORAL | 3 refills | Status: DC

## 2022-05-06 NOTE — Patient Instructions (Signed)
It was a pleasure seeing you today.  I believe your symptoms are related to a viral upper respiratory infection.  We have tested you for COVID, flu, RSV.  The treatment for this is symptomatic management so make sure you are taking Tylenol or ibuprofen as needed for pain or fevers.  He can also drink hot tea with honey for your sore throat.  If you have any shortness of breath or chest pain when she go to the emergency department.  Regarding your other complaints I want to check a number of labs.  The tingling may be coming from you not being on your Synthroid medication for some time.  I sent prescriptions for your atorvastatin as well as your Trelegy for you to use as prescribed.  Please be sure to come to the visit with Dr. Andria Frames on 6/19 at 8:30 AM.  If you have any questions or concerns call the clinic.  Hope you have a wonderful day!

## 2022-05-06 NOTE — Assessment & Plan Note (Signed)
Signs and symptoms consistent with likely viral upper respiratory infection.  Patient has not been having any shortness of breath so feel like a COPD/asthma exacerbation is less likely.  We will test for COVID today and recommended supportive care including Tylenol for fever discomfort, hot tea with honey, adequate hydration, OTC medications.  Strict return and ED precautions given.

## 2022-05-06 NOTE — Assessment & Plan Note (Addendum)
Numbness and tingling in hands feet, face with no clear etiology.  May be related to abnormal thyroid levels given patient reports she has not been taking her Synthroid because she has been out of this medication.  She has not been taking any of her medications in some time.  We will check TSH today and once those results come back plan on restarting Synthroid.  Also checking CMP, CBC.  Patient may need adjustments in her medications.  She is scheduled to follow-up with her PCP in 12 days.

## 2022-05-06 NOTE — Progress Notes (Signed)
    SUBJECTIVE:   CHIEF COMPLAINT / HPI:   Cough  Patient reports she has had a cough as well as body aches for approximately 7 days.  Reports that other people living in her house are also sick.  She reports that the symptoms started right when she moved back in with her daughter and everyone in the house are smokers except for her.  She has secondhand exposure to the smoke constantly.  She does report that she has had worsening body aches as well as redness in her right eye.  No changes in vision.  She also notes tingling in her hands, feet, face.  This tingling comes and goes and is not present at this time.  Is also having issues with fatigue.  Denies any other symptoms.   OBJECTIVE:   BP (!) 112/53   Pulse 66   Temp 99 F (37.2 C) (Oral)   Ht '5\' 4"'$  (1.626 m)   Wt 201 lb (91.2 kg)   SpO2 99%   BMI 34.50 kg/m   General: Pleasant 76 year old female HEENT: Moist mucous membranes, rhinorrhea, posterior pharynx erythema Cardiac: Regular rate and rhythm, no murmurs appreciated Respiratory: Normal work of breathing, lungs clear to auscultation bilaterally Abdomen: Soft, nontender Neuro: No cranial nerve deficits, no sensory deficits, no slurred speech  ASSESSMENT/PLAN:   Viral URI Signs and symptoms consistent with likely viral upper respiratory infection.  Patient has not been having any shortness of breath so feel like a COPD/asthma exacerbation is less likely.  We will test for COVID today and recommended supportive care including Tylenol for fever discomfort, hot tea with honey, adequate hydration, OTC medications.  Strict return and ED precautions given.  Paresthesia Numbness and tingling in hands feet, face with no clear etiology.  May be related to abnormal thyroid levels given patient reports she has not been taking her Synthroid because she has been out of this medication.  She has not been taking any of her medications in some time.  We will check TSH today and once those  results come back plan on restarting Synthroid.  Also checking CMP, CBC.  Patient may need adjustments in her medications.  She is scheduled to follow-up with her PCP in 12 days.     Gifford Shave, MD [place]

## 2022-05-07 ENCOUNTER — Telehealth: Payer: Self-pay | Admitting: Family Medicine

## 2022-05-07 ENCOUNTER — Other Ambulatory Visit: Payer: Self-pay | Admitting: Family Medicine

## 2022-05-07 DIAGNOSIS — K219 Gastro-esophageal reflux disease without esophagitis: Secondary | ICD-10-CM

## 2022-05-07 NOTE — Telephone Encounter (Signed)
Spoke with patient and her daughter in law Erica Morris today regarding patient's lab results.  Erica Morris reports that the patient is confused about which medication she is taking and she has been taking all of her blood pressure medications as prescribed.  She is not taking the potassium like she should be and is taking maybe 1 tablet every other day.  Discussed that her potassium was low and she needed to start taking her potassium as prescribed which was 40 mg daily and to take this until she sees her PCP in 2 weeks and repeat lab work can be collected.  Patient is agreeable to this.

## 2022-05-08 LAB — CBC
Hematocrit: 30.8 % — ABNORMAL LOW (ref 34.0–46.6)
Hemoglobin: 10.5 g/dL — ABNORMAL LOW (ref 11.1–15.9)
MCH: 27.1 pg (ref 26.6–33.0)
MCHC: 34.1 g/dL (ref 31.5–35.7)
MCV: 79 fL (ref 79–97)
Platelets: 225 10*3/uL (ref 150–450)
RBC: 3.88 x10E6/uL (ref 3.77–5.28)
RDW: 13 % (ref 11.7–15.4)
WBC: 13.5 10*3/uL — ABNORMAL HIGH (ref 3.4–10.8)

## 2022-05-08 LAB — TSH: TSH: 1.44 u[IU]/mL (ref 0.450–4.500)

## 2022-05-08 LAB — COMPREHENSIVE METABOLIC PANEL
ALT: 7 IU/L (ref 0–32)
AST: 9 IU/L (ref 0–40)
Albumin/Globulin Ratio: 1.5 (ref 1.2–2.2)
Albumin: 3.4 g/dL — ABNORMAL LOW (ref 3.7–4.7)
Alkaline Phosphatase: 130 IU/L — ABNORMAL HIGH (ref 44–121)
BUN/Creatinine Ratio: 9 — ABNORMAL LOW (ref 12–28)
BUN: 10 mg/dL (ref 8–27)
Bilirubin Total: 0.4 mg/dL (ref 0.0–1.2)
CO2: 24 mmol/L (ref 20–29)
Calcium: 7.4 mg/dL — ABNORMAL LOW (ref 8.7–10.3)
Chloride: 100 mmol/L (ref 96–106)
Creatinine, Ser: 1.13 mg/dL — ABNORMAL HIGH (ref 0.57–1.00)
Globulin, Total: 2.3 g/dL (ref 1.5–4.5)
Glucose: 113 mg/dL — ABNORMAL HIGH (ref 70–99)
Potassium: 2.6 mmol/L — ABNORMAL LOW (ref 3.5–5.2)
Sodium: 140 mmol/L (ref 134–144)
Total Protein: 5.7 g/dL — ABNORMAL LOW (ref 6.0–8.5)
eGFR: 51 mL/min/{1.73_m2} — ABNORMAL LOW (ref >59)

## 2022-05-08 LAB — COVID-19, FLU A+B AND RSV
Influenza A, NAA: NOT DETECTED
Influenza B, NAA: NOT DETECTED
RSV, NAA: NOT DETECTED
SARS-CoV-2, NAA: NOT DETECTED

## 2022-05-11 ENCOUNTER — Encounter (INDEPENDENT_AMBULATORY_CARE_PROVIDER_SITE_OTHER): Payer: Medicare Other | Admitting: Ophthalmology

## 2022-05-13 ENCOUNTER — Other Ambulatory Visit: Payer: Self-pay

## 2022-05-13 ENCOUNTER — Encounter (HOSPITAL_COMMUNITY): Payer: Self-pay | Admitting: *Deleted

## 2022-05-13 ENCOUNTER — Observation Stay (HOSPITAL_COMMUNITY)
Admission: EM | Admit: 2022-05-13 | Discharge: 2022-05-14 | Disposition: A | Discharge status: Home / Self Care | Payer: Medicare Other | Attending: Family Medicine | Admitting: Family Medicine

## 2022-05-13 ENCOUNTER — Emergency Department (HOSPITAL_COMMUNITY): Payer: Medicare Other

## 2022-05-13 DIAGNOSIS — Z79899 Other long term (current) drug therapy: Secondary | ICD-10-CM | POA: Diagnosis not present

## 2022-05-13 DIAGNOSIS — I50810 Right heart failure, unspecified: Secondary | ICD-10-CM | POA: Diagnosis not present

## 2022-05-13 DIAGNOSIS — E039 Hypothyroidism, unspecified: Secondary | ICD-10-CM | POA: Insufficient documentation

## 2022-05-13 DIAGNOSIS — J189 Pneumonia, unspecified organism: Principal | ICD-10-CM | POA: Insufficient documentation

## 2022-05-13 DIAGNOSIS — J90 Pleural effusion, not elsewhere classified: Secondary | ICD-10-CM | POA: Diagnosis not present

## 2022-05-13 DIAGNOSIS — J441 Chronic obstructive pulmonary disease with (acute) exacerbation: Secondary | ICD-10-CM | POA: Diagnosis not present

## 2022-05-13 DIAGNOSIS — R062 Wheezing: Secondary | ICD-10-CM | POA: Diagnosis not present

## 2022-05-13 DIAGNOSIS — Z743 Need for continuous supervision: Secondary | ICD-10-CM | POA: Diagnosis not present

## 2022-05-13 DIAGNOSIS — Z8673 Personal history of transient ischemic attack (TIA), and cerebral infarction without residual deficits: Secondary | ICD-10-CM | POA: Insufficient documentation

## 2022-05-13 DIAGNOSIS — R0600 Dyspnea, unspecified: Secondary | ICD-10-CM | POA: Diagnosis not present

## 2022-05-13 DIAGNOSIS — R6889 Other general symptoms and signs: Secondary | ICD-10-CM | POA: Diagnosis not present

## 2022-05-13 DIAGNOSIS — R0689 Other abnormalities of breathing: Secondary | ICD-10-CM | POA: Diagnosis not present

## 2022-05-13 DIAGNOSIS — M549 Dorsalgia, unspecified: Secondary | ICD-10-CM | POA: Diagnosis not present

## 2022-05-13 DIAGNOSIS — R059 Cough, unspecified: Secondary | ICD-10-CM | POA: Diagnosis not present

## 2022-05-13 DIAGNOSIS — R0902 Hypoxemia: Secondary | ICD-10-CM | POA: Diagnosis not present

## 2022-05-13 DIAGNOSIS — J449 Chronic obstructive pulmonary disease, unspecified: Secondary | ICD-10-CM | POA: Diagnosis present

## 2022-05-13 DIAGNOSIS — K449 Diaphragmatic hernia without obstruction or gangrene: Secondary | ICD-10-CM | POA: Diagnosis not present

## 2022-05-13 DIAGNOSIS — I110 Hypertensive heart disease with heart failure: Secondary | ICD-10-CM | POA: Diagnosis not present

## 2022-05-13 DIAGNOSIS — Z7982 Long term (current) use of aspirin: Secondary | ICD-10-CM | POA: Diagnosis not present

## 2022-05-13 DIAGNOSIS — R0602 Shortness of breath: Secondary | ICD-10-CM | POA: Diagnosis not present

## 2022-05-13 HISTORY — DX: Pneumonia, unspecified organism: J18.9

## 2022-05-13 HISTORY — DX: Personal history of transient ischemic attack (TIA), and cerebral infarction without residual deficits: Z86.73

## 2022-05-13 LAB — CBC
HCT: 32.6 % — ABNORMAL LOW (ref 36.0–46.0)
Hemoglobin: 10.2 g/dL — ABNORMAL LOW (ref 12.0–15.0)
MCH: 26.6 pg (ref 26.0–34.0)
MCHC: 31.3 g/dL (ref 30.0–36.0)
MCV: 85.1 fL (ref 80.0–100.0)
Platelets: 337 10*3/uL (ref 150–400)
RBC: 3.83 MIL/uL — ABNORMAL LOW (ref 3.87–5.11)
RDW: 13.3 % (ref 11.5–15.5)
WBC: 9.6 10*3/uL (ref 4.0–10.5)
nRBC: 0 % (ref 0.0–0.2)

## 2022-05-13 LAB — BASIC METABOLIC PANEL
Anion gap: 12 (ref 5–15)
BUN: 7 mg/dL — ABNORMAL LOW (ref 8–23)
CO2: 19 mmol/L — ABNORMAL LOW (ref 22–32)
Calcium: 7.4 mg/dL — ABNORMAL LOW (ref 8.9–10.3)
Chloride: 107 mmol/L (ref 98–111)
Creatinine, Ser: 1.2 mg/dL — ABNORMAL HIGH (ref 0.44–1.00)
GFR, Estimated: 48 mL/min — ABNORMAL LOW (ref >60)
Glucose, Bld: 135 mg/dL — ABNORMAL HIGH (ref 70–99)
Potassium: 3.7 mmol/L (ref 3.5–5.1)
Sodium: 138 mmol/L (ref 135–145)

## 2022-05-13 LAB — TROPONIN I (HIGH SENSITIVITY)
Troponin I (High Sensitivity): 5 ng/L (ref <18)
Troponin I (High Sensitivity): 4 ng/L (ref <18)

## 2022-05-13 LAB — BRAIN NATRIURETIC PEPTIDE: B Natriuretic Peptide: 175.1 pg/mL — ABNORMAL HIGH (ref 0.0–100.0)

## 2022-05-13 MED ORDER — ACETAMINOPHEN 325 MG PO TABS
650.0000 mg | ORAL_TABLET | Freq: Four times a day (QID) | ORAL | Status: DC | PRN
  Administered 2022-05-13: 650 mg via ORAL
  Filled 2022-05-13: qty 2

## 2022-05-13 MED ORDER — FLUTICASONE PROPIONATE 50 MCG/ACT NA SUSP
2.0000 | Freq: Every day | NASAL | Status: DC
Start: 2022-05-13 — End: 2022-05-14
  Administered 2022-05-14: 2 via NASAL
  Filled 2022-05-13: qty 16

## 2022-05-13 MED ORDER — REVEFENACIN 175 MCG/3ML IN SOLN
175.0000 ug | Freq: Every day | RESPIRATORY_TRACT | Status: DC
  Administered 2022-05-13 – 2022-05-14 (×2): 175 ug via RESPIRATORY_TRACT
  Filled 2022-05-13 (×2): qty 3

## 2022-05-13 MED ORDER — ALBUTEROL SULFATE (2.5 MG/3ML) 0.083% IN NEBU
2.0000 mL | INHALATION_SOLUTION | Freq: Four times a day (QID) | RESPIRATORY_TRACT | Status: DC | PRN
  Administered 2022-05-14: 2 mL via RESPIRATORY_TRACT
  Filled 2022-05-13: qty 3

## 2022-05-13 MED ORDER — FUROSEMIDE 40 MG PO TABS
40.0000 mg | ORAL_TABLET | Freq: Two times a day (BID) | ORAL | Status: DC
  Administered 2022-05-13 – 2022-05-14 (×3): 40 mg via ORAL
  Filled 2022-05-13 (×3): qty 2

## 2022-05-13 MED ORDER — METOPROLOL TARTRATE 25 MG PO TABS
25.0000 mg | ORAL_TABLET | Freq: Two times a day (BID) | ORAL | Status: DC
  Administered 2022-05-13 – 2022-05-14 (×3): 25 mg via ORAL
  Filled 2022-05-13 (×3): qty 1

## 2022-05-13 MED ORDER — SODIUM CHLORIDE 0.9 % IV SOLN
1.0000 g | Freq: Once | INTRAVENOUS | Status: AC
  Administered 2022-05-13: 1 g via INTRAVENOUS
  Filled 2022-05-13: qty 10

## 2022-05-13 MED ORDER — AZITHROMYCIN 250 MG PO TABS
500.0000 mg | ORAL_TABLET | Freq: Once | ORAL | Status: AC
Start: 2022-05-13 — End: 2022-05-13
  Administered 2022-05-13: 500 mg via ORAL
  Filled 2022-05-13: qty 2

## 2022-05-13 MED ORDER — LOSARTAN POTASSIUM 50 MG PO TABS
50.0000 mg | ORAL_TABLET | Freq: Every day | ORAL | Status: DC
  Administered 2022-05-13: 50 mg via ORAL
  Filled 2022-05-13: qty 1

## 2022-05-13 MED ORDER — POTASSIUM CHLORIDE CRYS ER 20 MEQ PO TBCR
20.0000 meq | EXTENDED_RELEASE_TABLET | Freq: Two times a day (BID) | ORAL | Status: DC
  Administered 2022-05-13 – 2022-05-14 (×3): 20 meq via ORAL
  Filled 2022-05-13 (×3): qty 1

## 2022-05-13 MED ORDER — CARBAMAZEPINE 200 MG PO TABS
100.0000 mg | ORAL_TABLET | Freq: Two times a day (BID) | ORAL | Status: DC
  Administered 2022-05-13 – 2022-05-14 (×3): 100 mg via ORAL
  Filled 2022-05-13 (×4): qty 0.5

## 2022-05-13 MED ORDER — LEVOTHYROXINE SODIUM 50 MCG PO TABS
50.0000 ug | ORAL_TABLET | Freq: Every day | ORAL | Status: DC
  Administered 2022-05-14: 50 ug via ORAL
  Filled 2022-05-13: qty 1

## 2022-05-13 MED ORDER — ATORVASTATIN CALCIUM 80 MG PO TABS
80.0000 mg | ORAL_TABLET | Freq: Every day | ORAL | Status: DC
  Administered 2022-05-13 – 2022-05-14 (×2): 80 mg via ORAL
  Filled 2022-05-13: qty 1
  Filled 2022-05-13: qty 2

## 2022-05-13 MED ORDER — AZITHROMYCIN 250 MG PO TABS
500.0000 mg | ORAL_TABLET | Freq: Every day | ORAL | Status: DC
  Administered 2022-05-14: 500 mg via ORAL
  Filled 2022-05-13: qty 2

## 2022-05-13 MED ORDER — UMECLIDINIUM BROMIDE 62.5 MCG/ACT IN AEPB: 1.0000 | INHALATION_SPRAY | Freq: Every day | RESPIRATORY_TRACT | Status: DC

## 2022-05-13 MED ORDER — ENOXAPARIN SODIUM 40 MG/0.4ML IJ SOSY
40.0000 mg | PREFILLED_SYRINGE | Freq: Every day | INTRAMUSCULAR | Status: DC
  Administered 2022-05-13 – 2022-05-14 (×2): 40 mg via SUBCUTANEOUS
  Filled 2022-05-13 (×3): qty 0.4

## 2022-05-13 MED ORDER — FLUTICASONE FUROATE-VILANTEROL 100-25 MCG/ACT IN AEPB
1.0000 | INHALATION_SPRAY | Freq: Every day | RESPIRATORY_TRACT | Status: DC
  Filled 2022-05-13 (×2): qty 28

## 2022-05-13 MED ORDER — FLUOXETINE HCL 20 MG PO CAPS
40.0000 mg | ORAL_CAPSULE | Freq: Every day | ORAL | Status: DC
  Administered 2022-05-13 – 2022-05-14 (×2): 40 mg via ORAL
  Filled 2022-05-13 (×2): qty 2

## 2022-05-13 MED ORDER — PREDNISONE 20 MG PO TABS
40.0000 mg | ORAL_TABLET | Freq: Every day | ORAL | Status: DC
  Administered 2022-05-14: 40 mg via ORAL
  Filled 2022-05-13: qty 2

## 2022-05-13 MED ORDER — ASPIRIN 81 MG PO TBEC
81.0000 mg | DELAYED_RELEASE_TABLET | Freq: Every day | ORAL | Status: DC
  Administered 2022-05-13 – 2022-05-14 (×2): 81 mg via ORAL
  Filled 2022-05-13 (×2): qty 1

## 2022-05-13 MED ORDER — ACETAMINOPHEN 650 MG RE SUPP: 650.0000 mg | Freq: Four times a day (QID) | RECTAL | Status: DC | PRN

## 2022-05-13 MED ORDER — IOHEXOL 350 MG/ML SOLN
100.0000 mL | Freq: Once | INTRAVENOUS | Status: AC | PRN
  Administered 2022-05-13: 75 mL via INTRAVENOUS

## 2022-05-13 MED ORDER — AMLODIPINE BESYLATE 10 MG PO TABS
10.0000 mg | ORAL_TABLET | Freq: Every day | ORAL | Status: DC
  Administered 2022-05-13 – 2022-05-14 (×2): 10 mg via ORAL
  Filled 2022-05-13 (×2): qty 2

## 2022-05-13 MED ORDER — ALBUTEROL SULFATE (2.5 MG/3ML) 0.083% IN NEBU
5.0000 mg | INHALATION_SOLUTION | Freq: Once | RESPIRATORY_TRACT | Status: AC
  Administered 2022-05-13: 5 mg via RESPIRATORY_TRACT
  Filled 2022-05-13: qty 6

## 2022-05-13 MED ORDER — PANTOPRAZOLE SODIUM 40 MG PO TBEC
40.0000 mg | DELAYED_RELEASE_TABLET | Freq: Every day | ORAL | Status: DC
Start: 2022-05-13 — End: 2022-05-14
  Administered 2022-05-13 – 2022-05-14 (×2): 40 mg via ORAL
  Filled 2022-05-13 (×2): qty 1

## 2022-05-13 NOTE — ED Notes (Signed)
Pulse oximetry while ambulating 99-100% on room air.

## 2022-05-13 NOTE — Discharge Instructions (Addendum)
Dear Erica Morris,   Thank you for letting us participate in your care! In this section, you will find a brief summary of why you were admitted to the hospital, what happened during your admission, your diagnosis/diagnoses, and recommended follow up.  You were admitted because you were experiencing cough and shortness of breath.  Your testing revealed a pneumonia.  You were also diagnosed with a COPD exacerbation. You were treated with antibiotics and steroids.  Your breathing remained stable and you were discharged from the hospital for meeting this goal.    POST-HOSPITAL & CARE INSTRUCTIONS Please see medications section of this packet for any medication changes. You will need to complete a few more days of antibiotics and steroids.  DOCTOR'S APPOINTMENTS & FOLLOW UP Future Appointments  Date Time Provider Department Center  05/18/2022  8:50 AM Zenia Resides, MD [facility]  06/03/2022  9:00 AM [place], Clent Demark, MD [facility] None     Thank you for choosing [facility]! Take care and be well!  [facility]  [place], [place] [address] [phone]

## 2022-05-13 NOTE — ED Notes (Signed)
While walking, Pt CO of dizziness and weakness in her legs. Unable to walk without assistance and was winded/coughing after returning to bed.

## 2022-05-13 NOTE — ED Provider Notes (Signed)
[facility] Provider Note   CSN: [id number] Arrival date & time: 05/13/22  0141     History  Chief Complaint  Patient presents with   Shortness of Breath    Erica Morris is a 76 y.o. female.  The history is provided by the patient.  Shortness of Breath Severity:  Moderate Onset quality:  Gradual Duration:  1 week Timing:  Intermittent Chronicity:  New Worsened by:  Activity Associated symptoms: cough and sore throat   Associated symptoms: no abdominal pain, no fever and no hemoptysis   Associated symptoms comment:  Chest pain with cough Risk factors: no tobacco use    Patient reports she has had a dry cough for over 2 weeks.  She reports wheezing and shortness of breath.  She reports chest and back pain due to coughing.  EMS was called and she was given albuterol, Atrovent Solu-Medrol with some improvement   Past Medical History:  Diagnosis Date   ABDOMINAL PAIN, RECURRENT 03/29/2008   ANXIETY 10/07/2007   Candidiasis of mouth 05/30/2010   GERD 07/25/2007   Hernia, hiatal    HYPERTENSION 07/25/2007   NSTEMI (non-ST elevated myocardial infarction) ([place]) 10/2017   Posterior vitreous detachment of left eye 09/05/2020   Stroke [facility])     Home Medications Prior to Admission medications   Medication Sig Start Date End Date Taking? Authorizing Provider  acetaminophen (TYLENOL) 500 MG tablet Take 1,000 mg by mouth every 6 (six) hours as needed for moderate pain.   Yes [provider]  albuterol (VENTOLIN HFA) 108 (90 Base) MCG/ACT inhaler Inhale 2 puffs into the lungs every 6 (six) hours as needed for wheezing or shortness of breath. 10/03/21  Yes Eppie Gibson, MD  amLODipine (NORVASC) 10 MG tablet Take 1 tablet (10 mg total) by mouth daily. 10/03/21  Yes Eppie Gibson, MD  aspirin EC 81 MG tablet Take 81 mg by mouth every morning.    Yes [provider]  atorvastatin (LIPITOR) 80 MG tablet Take 1 tablet (80 mg  total) by mouth daily. 05/06/22  Yes Cresenzo, Angelyn Punt, MD  carbamazepine (TEGRETOL) 200 MG tablet Take 0.5 tablets (100 mg total) by mouth 2 (two) times daily. 10/03/21  Yes Eppie Gibson, MD  FLUoxetine (PROZAC) 40 MG capsule Take 1 capsule (40 mg total) by mouth daily. 10/03/21  Yes Eppie Gibson, MD  fluticasone (FLONASE) 50 MCG/ACT nasal spray Place 2 sprays into both nostrils daily. 10/31/21  Yes Alen Bleacher, MD  Fluticasone-Umeclidin-Vilant (TRELEGY ELLIPTA) 100-62.5-25 MCG/ACT AEPB Inhale 1 puff into the lungs daily. 05/06/22  Yes Cresenzo, Angelyn Punt, MD  furosemide (LASIX) 20 MG tablet TAKE 2 TABLETS BY MOUTH 2 TIMES DAILY. Patient taking differently: Take 40 mg by mouth 2 (two) times daily. 04/06/22  Yes Hensel, Jamal Collin, MD  guaiFENesin-dextromethorphan (ROBITUSSIN DM) 100-10 MG/5ML syrup Take 10 mLs by mouth every 4 (four) hours as needed for cough.   Yes [provider]  levothyroxine (SYNTHROID) 50 MCG tablet Take 1 tablet (50 mcg total) by mouth daily before breakfast. 10/31/21  Yes Alen Bleacher, MD  losartan (COZAAR) 50 MG tablet Take 1 tablet (50 mg total) by mouth at bedtime. 10/03/21  Yes Eppie Gibson, MD  metoprolol tartrate (LOPRESSOR) 25 MG tablet Take 1 tablet (25 mg total) by mouth 2 (two) times daily. 10/31/21 05/13/22 Yes Alen Bleacher, MD  pantoprazole (PROTONIX) 40 MG tablet TAKE 1 TABLET BY MOUTH EVERY DAY Patient taking differently: Take  40 mg by mouth daily. 05/07/22  Yes Hensel, Jamal Collin, MD  potassium chloride SA (KLOR-CON M) 20 MEQ tablet Take 1 tablet (20 mEq total) by mouth 2 (two) times daily. 10/31/21  Yes Alen Bleacher, MD  Pseudoeph-Doxylamine-DM-APAP (NYQUIL PO) Take 5 mLs by mouth 2 (two) times daily as needed (congestion).   Yes [provider]  QUEtiapine (SEROQUEL) 50 MG tablet TAKE 1 TABLET BY MOUTH EVERY MORNING AND 2 TABLETS BY MOUTH EVERY EVENING Patient taking differently: Take 50-100 mg by mouth See admin instructions. 50 mg in the  morning  100 mg  in the evening 04/20/22  Yes Hensel, Jamal Collin, MD  diclofenac Sodium (VOLTAREN) 1 % GEL Apply 2 g topically 4 (four) times daily. Patient not taking: Reported on 05/13/2022 10/03/21   Eppie Gibson, MD  nystatin (MYCOSTATIN) 100000 UNIT/ML suspension Take 5 mLs (500,000 Units total) by mouth 4 (four) times daily. Patient not taking: Reported on 05/13/2022 10/03/21   Eppie Gibson, MD      Allergies    Lisinopril, Codeine phosphate, and Morphine and related    Review of Systems   Review of Systems  Constitutional:  Negative for fever.  HENT:  Positive for sore throat.   Respiratory:  Positive for cough and shortness of breath. Negative for hemoptysis.   Gastrointestinal:  Negative for abdominal pain.    Physical Exam Updated Vital Signs BP 135/77   Pulse 75   Temp 97.9 F (36.6 C) (Oral)   Resp 16   SpO2 98%  Physical Exam CONSTITUTIONAL: Elderly, anxious appearing HEAD: Normocephalic/atraumatic EYES: EOMI/PERRL ENMT: Mucous membranes moist, uvula midline, no erythema or exudate NECK: supple no meningeal signs SPINE/BACK:entire spine nontender CV: S1/S2 noted, no murmurs/rubs/gallops noted LUNGS: Lungs are clear to auscultation bilaterally, no apparent distress, mild tachypnea noted, no wheezing or crackle ABDOMEN: soft, nontender, no rebound or guarding, bowel sounds noted throughout abdomen GU:no cva tenderness NEURO: Pt is awake/alert/appropriate, moves all extremitiesx4.  No facial droop.   EXTREMITIES: pulses normal/equal, full ROM, minimal edema noted bilateral lower extremities SKIN: warm, color normal PSYCH: Anxious ED Results / Procedures / Treatments   Labs (all labs ordered are listed, but only abnormal results are displayed) Labs Reviewed  CBC - Abnormal; Notable for the following components:      Result Value   RBC 3.83 (*)    Hemoglobin 10.2 (*)    HCT 32.6 (*)    All other components within normal limits  BASIC METABOLIC PANEL -  Abnormal; Notable for the following components:   CO2 19 (*)    Glucose, Bld 135 (*)    BUN 7 (*)    Creatinine, Ser 1.20 (*)    Calcium 7.4 (*)    GFR, Estimated 48 (*)    All other components within normal limits  BRAIN NATRIURETIC PEPTIDE - Abnormal; Notable for the following components:   B Natriuretic Peptide 175.1 (*)    All other components within normal limits  TROPONIN I (HIGH SENSITIVITY)  TROPONIN I (HIGH SENSITIVITY)    EKG EKG Interpretation  Date/Time:  Wednesday May 13 2022 01:57:15 EDT Ventricular Rate:  68 PR Interval:  136 QRS Duration: 92 QT Interval:  438 QTC Calculation: 466 R Axis:   38 Text Interpretation: Sinus rhythm Atrial premature complex Confirmed by Ripley Fraise [id number]) on 05/13/2022 1:58:39 AM  Radiology CT Angio Chest PE W and/or Wo Contrast  Result Date: 05/13/2022 CLINICAL DATA:  Cough since June 1st wheezing and rhonchi reported. EXAM:  CT ANGIOGRAPHY CHEST WITH CONTRAST TECHNIQUE: Multidetector CT imaging of the chest was performed using the standard protocol during bolus administration of intravenous contrast. Multiplanar CT image reconstructions and MIPs were obtained to evaluate the vascular anatomy. RADIATION DOSE REDUCTION: This exam was performed according to the departmental dose-optimization program which includes automated exposure control, adjustment of the mA and/or kV according to patient size and/or use of iterative reconstruction technique. CONTRAST:  37m OMNIPAQUE IOHEXOL 350 MG/ML SOLN COMPARISON:  CTA chest 11/26/2017, portable chest earlier today FINDINGS: Cardiovascular: There is mild panchamber cardiomegaly with a left chamber predominance. No pericardial effusion is seen. There are trace three-vessel coronary artery calcifications. The pulmonary arteries are normal in caliber without evidence of thromboemboli. There is mild-to-moderate patchy calcific plaque in the arch and descending aorta without aneurysm or dissection with  normal opacification in the great vessels. There are no penetrating ulcers. There is mild tortuosity in the descending aortic segment. The pulmonary veins are mildly distended. Mediastinum/Nodes: There are mildly prominent mediastinal and bilateral hilar lymph nodes up to 1.2 cm in short axis. The lower poles of the thyroid, axillary spaces are unremarkable. There is a small hiatal hernia. The esophagus is unremarkable. The trachea is clear. Lungs/Pleura: There are bilateral minimal layering symmetric pleural effusions. There is no pneumothorax no pleural thickening. Bronchial thickening is noted in the upper and lower lobes. There are small patchy areas of consolidation posteriorly in the right lower lobe and in the lingular base not seen previously. Laterally in the base of the right upper lobe there is additional scattered ground-glass opacity peripherally. Findings are most likely due to multifocal pneumonia. No discrete nodule is seen. The central airways and remaining lungs are clear apart from scattered linear scar-like opacities in the bases. Upper Abdomen: There are stones in the proximal gallbladder but no gallbladder thickening or biliary dilatation. There are parapelvic cysts in both kidneys largest 3.2 cm on the right. Musculoskeletal: No chest wall abnormality. No acute or significant osseous findings. Review of the MIP images confirms the above findings. IMPRESSION: 1. No evidence of pulmonary arterial emboli. 2. Cardiomegaly with prominent central veins and minimal pleural effusions but no subpleural edema. 3. Multifocal lung opacities most likely due to multifocal pneumonia, with mildly prominent mediastinal and hilar lymph nodes possibly reactive. Follow-up study recommended to ensure clearing after treatment. 4. Cholelithiasis. 5. Aortic and coronary artery atherosclerosis. Electronically Signed   By: KTelford NabM.D.   On: 05/13/2022 05:15   DG Chest Port 1 View  Result Date:  05/13/2022 CLINICAL DATA:  Dyspnea EXAM: PORTABLE CHEST 1 VIEW COMPARISON:  03/08/2021 FINDINGS: Nodular opacities have developed within the right perihilar region and lung bases bilaterally which may be inflammatory in nature, as can be seen with septic embolization, or reflect pulmonary metastatic disease. Pulmonary insufflation is normal and symmetric. No pneumothorax or pleural effusion. Cardiac size is within normal limits. No acute bone abnormality. IMPRESSION: Nodular opacities have developed within the right perihilar region and lung bases bilaterally, as can be seen with septic embolization, or reflect pulmonary metastatic disease. This would be better assessed with dedicated CT imaging. Electronically Signed   By: AFidela SalisburyM.D.   On: 05/13/2022 02:13    Procedures Procedures    Medications Ordered in ED Medications  cefTRIAXone (ROCEPHIN) 1 g in sodium chloride 0.9 % 100 mL IVPB (has no administration in time range)  azithromycin (ZITHROMAX) tablet 500 mg (has no administration in time range)  albuterol (PROVENTIL) (2.5 MG/3ML) 0.083% nebulizer  solution 5 mg (has no administration in time range)  albuterol (PROVENTIL) (2.5 MG/3ML) 0.083% nebulizer solution 5 mg (5 mg Nebulization Given 05/13/22 0326)  iohexol (OMNIPAQUE) 350 MG/ML injection 100 mL (75 mLs Intravenous Contrast Given 05/13/22 0445)    ED Course/ Medical Decision Making/ A&P Clinical Course as of 05/13/22 0648  Wed May 13, 2022  0313 Patient reporting she is feeling more short of breath, will give another round of albuterol, will obtain CT chest due to abnormal chest x-ray findings [DW]  0313 Hemoglobin(!): 10.2 Anemia noted [DW]  0541 CT chest negative for acute PE, probable multifocal pneumonia.  We will ambulate patient [DW]  [phone] Patient attempted ambulation but began feeling short of breath and lightheaded.  Patient began to cough again.  Given findings of multifocal pneumonia on CT chest, patient be admitted to  the hospital.  She currently does not have an oxygen requirement but given her symptoms and age she would benefit from admission [DW]  [phone] Discussed with family medicine resident Dr. Chauncey Reading, patient will be admitted to their service for treatment for pneumonia [DW]    Clinical Course User Index [DW] Ripley Fraise, MD                           Medical Decision Making Amount and/or Complexity of Data Reviewed Labs: ordered. Decision-making details documented in ED Course. Radiology: ordered.  Risk Prescription drug management. Decision regarding hospitalization.   This patient presents to the ED for concern of shortness of breath, this involves an extensive number of treatment options, and is a complaint that carries with it a high risk of complications and morbidity.  The differential diagnosis includes but is not limited to CHF, pulmonary embolism, pneumonia, pneumothorax, acute coronary syndrome, bronchospasm  Comorbidities that complicate the patient evaluation: Patient's presentation is complicated by their history of CAD   Additional history obtained: Records reviewed Primary Care Documents  Lab Tests: I Ordered, and personally interpreted labs.  The pertinent results include: Hyperglycemia, mild anemia  Imaging Studies ordered: I ordered imaging studies including X-ray chest   I independently visualized and interpreted imaging which showed emboli versus metastatic lesions I agree with the radiologist interpretation  CT chest reveals pneumonia  Cardiac Monitoring: The patient was maintained on a cardiac monitor.  I personally viewed and interpreted the cardiac monitor which showed an underlying rhythm of:  sinus rhythm  Medicines ordered and prescription drug management: I ordered medication including albuterol, Atrovent, for pneumonia Reevaluation of the patient after these medicines showed that the patient    improved   Critical Interventions:  Nebulized  therapies and IV antibiotics  Consultations Obtained: I requested consultation with the admitting physician family medicine resident , and discussed  findings as well as pertinent plan - they recommend: admission  Reevaluation: After the interventions noted above, I reevaluated the patient and found that they have improved at rest, but worsens with ambulation  Complexity of problems addressed: Patient's presentation is most consistent with  acute presentation with potential threat to life or bodily function  Disposition: After consideration of the diagnostic results and the patient's response to treatment,  I feel that the patent would benefit from admission   .          Final Clinical Impression(s) / ED Diagnoses Final diagnoses:  Community acquired pneumonia of right upper lobe of lung  Multifocal pneumonia    Rx / DC Orders ED Discharge Orders  None         Ripley Fraise, MD 05/13/22 [phone]

## 2022-05-13 NOTE — ED Notes (Signed)
Pt given turkey sandwich and gingerale. 

## 2022-05-13 NOTE — Evaluation (Signed)
Physical Therapy Evaluation Patient Details Name: Erica Morris MRN: [id number] DOB: 10-Sep-1946 Today's Date: 05/13/2022  History of Present Illness  76 y.o. female presenting to ED 6/14 with SOB and cough. CTA completed and showed multifocal lung opacities most likely due to multifocal pneumonia as well as mediastinal LN. Pt found to have COPD exacerbation secondary to pneumonia.  PMH is significant for history of CVA, NSTEMI, COPD, hypertension, hypothyroidism, anxiety, GERD.  Clinical Impression  PTA pt living with son and daughter in-law in single story mobile home with ramped entrance. Pt reports independence with limited community ambulation, needing to push cart or ride in scooter in larger stores. Pt reports independence in ADL and iADLs. Pt is currently limited by 4/4 DoE with longer distance ambulation, and by generalized weakness. Pt is currently mod I for bed mobility and transfers and supervision for ambulation without AD. Pt able to ambulate on RA and maintain SpO2 99-100%O2, however experiences 4/4 DoE requiring standing rest break. Pt does not have any follow up PT needs however PT will follow acutely to work on energy conservation activities.        Recommendations for follow up therapy are one component of a multi-disciplinary discharge planning process, led by the attending physician.  Recommendations may be updated based on patient status, additional functional criteria and insurance authorization.  Follow Up Recommendations No PT follow up    Assistance Recommended at Discharge PRN     Equipment Recommendations None recommended by PT     Functional Status Assessment Patient has had a recent decline in their functional status and demonstrates the ability to make significant improvements in function in a reasonable and predictable amount of time.     Precautions / Restrictions Precautions Precautions: None Restrictions Weight Bearing Restrictions: No      Mobility   Bed Mobility Overal bed mobility: Modified Independent                  Transfers Overall transfer level: Modified independent                      Ambulation/Gait Ambulation/Gait assistance: Supervision Gait Distance (Feet): 120 Feet Assistive device: None Gait Pattern/deviations: WFL(Within Functional Limits), Step-through pattern, Shuffle Gait velocity: slowed Gait velocity interpretation: <1.8 ft/sec, indicate of risk for recurrent falls   General Gait Details: slowed, shuffling gait, no overt LoB, requires 1 x standing rest break due to 4/4 DoE        Balance Overall balance assessment: Mild deficits observed, not formally tested                                           Pertinent Vitals/Pain Pain Assessment Pain Assessment: Faces Faces Pain Scale: Hurts little more Pain Location: headache Pain Descriptors / Indicators: Headache Pain Intervention(s): Limited activity within patient's tolerance, Monitored during session, Other (comment) (pt thinks it is from not eating, provided sandwich and ginger ale)    Home Living Family/patient expects to be discharged to:: Private residence Living Arrangements: Children Available Help at Discharge: Family;Available 24 hours/day (majority of the time) Type of Home: Mobile home Home Access: Ramped entrance       Home Layout: One level Home Equipment: Hand held shower head      Prior Function Prior Level of Function : Independent/Modified Independent  Mobility Comments: ambulates limited community distances without AD, however       Hand Dominance   Dominant Hand: Right    Extremity/Trunk Assessment   Upper Extremity Assessment Upper Extremity Assessment: Generalized weakness    Lower Extremity Assessment Lower Extremity Assessment: Generalized weakness       Communication   Communication: No difficulties  Cognition Arousal/Alertness: Awake/alert Behavior  During Therapy: WFL for tasks assessed/performed Overall Cognitive Status: Within Functional Limits for tasks assessed                                          General Comments General comments (skin integrity, edema, etc.): Pt able to ambulate on RA maintaining SpO2 98-100%O2        Assessment/Plan    PT Assessment Patient needs continued PT services  PT Problem List Cardiopulmonary status limiting activity;Decreased strength;Decreased balance       PT Treatment Interventions Gait training;Functional mobility training;Balance training;Therapeutic activities;Therapeutic exercise;Patient/family education    PT Goals (Current goals can be found in the Care Plan section)  Acute Rehab PT Goals Patient Stated Goal: have less coughing PT Goal Formulation: With patient Time For Goal Achievement: 05/27/22 Potential to Achieve Goals: Good    Frequency Min 3X/week        AM-PAC PT "6 Clicks" Mobility  Outcome Measure Help needed turning from your back to your side while in a flat bed without using bedrails?: None Help needed moving from lying on your back to sitting on the side of a flat bed without using bedrails?: None Help needed moving to and from a bed to a chair (including a wheelchair)?: A Little Help needed standing up from a chair using your arms (e.g., wheelchair or bedside chair)?: A Little Help needed to walk in hospital room?: A Little Help needed climbing 3-5 steps with a railing? : A Little 6 Click Score: 20    End of Session Equipment Utilized During Treatment: Gait belt Activity Tolerance: Patient tolerated treatment well Patient left: in chair;with call bell/phone within reach Nurse Communication: Mobility status;Other (comment) (no need for supplemental O2) PT Visit Diagnosis: Muscle weakness (generalized) (M62.81);Difficulty in walking, not elsewhere classified (R26.2)    Time: 8916-9450 PT Time Calculation (min) (ACUTE ONLY): 20  min   Charges:   PT Evaluation $PT Eval Moderate Complexity: 1 Mod          Female Minish B. Migdalia Dk PT, DPT Acute Rehabilitation Services Please use secure chat or  Call Office [phone]   [place] 05/13/2022, 3:43 PM

## 2022-05-13 NOTE — H&P (Signed)
[facility] Admission History and Physical Service Pager: [phone]  Patient name: Erica Morris Medical record number: [id number] Date of birth: [birth year] Age: 76 y.o. Gender: female  Primary Care Provider: Zenia Resides, MD Consultants: None Code Status: Full code  Preferred Emergency Contact: Janora Norlander, sister, [phone]  Chief Complaint: Shortness of breath  Assessment and Plan: Erica Morris is a 76 y.o. female presenting with SOB and cough . PMH is significant for history of CVA, NSTEMI, COPD, hypertension, hypothyroidism, anxiety, GERD.   COPD exacerbation secondary to pneumonia patient noted to have desaturation with ambulation.  Does not require home oxygen however is symptomatic with lightheadedness and SOB with ambulation, saturating 98-100% at rest on room air at rest during my exam. CTA completed and showed multifocal lung opacities most likely due to multifocal pneumonia as well as mediastinal LN. Will recommend for outpatient follow up imaging to confirm resolution after recovery from acute illness. Report of back pain with coughing.;  -Admit to MedSurg, attending Dr. McDiarmid -albuterol every 4 hours PRN  -Continue Zithromax '500mg'$  for 4 additional days -s/p 1 dose CTX -Continue to monitor respiratory status with vital signs per shift -Continuous pulse oximetry -Physical therapy evaluation including ambulation with pulse oximetry - Tylenol PRN  - lidocaine patch and K pad for back pain with coughing    Hypertension Patient normotensive.  Home medications include amlodipine 10 mg and losartan 50 mg -Continue losartan -Continue amlodipine  Hypocalcemia Calcium low at 7.2 on admission likely 2/2 to lasix adherence.  -Will recheck with CMP in AM  -check magnesium AM  History of NSTEMI Patient home medications include aspirin 81 mg.  Reports some chest discomfort associated with coughing. No signs of ischemia on EKG today.   -Continue home aspirin  Hypothyroidism We will continue patient's home Synthroid 50 mcg daily  Anxiety Continue home Prozac 40 mg daily   FEN/GI: Heart healthy diet  Prophylaxis: Lovenox  Disposition: med surg  History of Present Illness:  Erica Morris is a 75 y.o. female presenting with cough, sore throat and wheezing.   Patient presented to the ED for 2 weeks of dry cough associated with wheezing and shortness of breath.  Chest imaging shows that patient has multifocal pneumonia.  Patient was also noted to be showing signs of respiratory distress with ambulation.  She reports that she lives with her son and daughter-in-law.  She states that she has also been experiencing a sore throat.  She denies any fevers.  Patient states that she had her medications 1 day ago.  Patient feels that she is having issues with balance at home and reports falls recently.   Review Of Systems: Per HPI with the following additions:   Review of Systems  Constitutional:  Positive for appetite change and chills. Negative for fever.  HENT:  Positive for sore throat.   Respiratory:  Positive for cough, shortness of breath and wheezing.   Gastrointestinal:  Negative for abdominal pain and nausea.  Genitourinary:  Negative for dysuria.  Musculoskeletal:  Positive for back pain.  Neurological:  Positive for headaches.     Patient Active Problem List   Diagnosis Date Noted   Paresthesia 05/06/2022   Nonadherence to medication 10/03/2021   Macular hole of left eye 10/17/2020   History of vitrectomy 09/05/2020   Jaw pain 05/14/2020   Greater trochanteric bursitis 04/04/2020   Muscle, jerky movements (uncontrolled) 02/11/2020   Hyperglycemia 11/08/2019   Migraine 09/15/2018   Healthcare maintenance  09/15/2018   Polypharmacy 06/10/2018   COPD suggested by initial evaluation ([place]) 01/06/2018   Cough 10/28/2017   NSTEMI (non-ST elevated myocardial infarction) ([place]) 10/28/2017   Vertigo 08/10/2017    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[place]) 03/10/2017   Osteoarthritis of spine with radiculopathy, cervical region 03/10/2017   Hypothyroidism 11/17/2016   Episode of recurrent major depressive disorder ([place])    History of CVA (cerebrovascular accident)    Pure hypercholesterolemia 11/06/2016   Chronic venous insufficiency 11/05/2016   Peripheral neuropathy 11/05/2016   Late effects of CVA (cerebrovascular accident) 06/09/2016   Orthostatic hypotension 06/09/2016   Hypokalemia 02/20/2015   History of colonic polyps 06/04/2014   Viral URI 08/29/2010   Thrush of mouth and esophagus ([place]) 05/30/2010   Anxiety state 10/07/2007   Essential hypertension 07/25/2007   GERD 07/25/2007    Past Medical History: Past Medical History:  Diagnosis Date   ABDOMINAL PAIN, RECURRENT 03/29/2008   ANXIETY 10/07/2007   Candidiasis of mouth 05/30/2010   GERD 07/25/2007   Hernia, hiatal    HYPERTENSION 07/25/2007   NSTEMI (non-ST elevated myocardial infarction) ([place]) 10/2017   Posterior vitreous detachment of left eye 09/05/2020   Stroke [facility])     Past Surgical History: Past Surgical History:  Procedure Laterality Date   ABDOMINAL HYSTERECTOMY     BREAST SURGERY     bx   KNEE ARTHROSCOPY     LEFT HEART CATH AND CORONARY ANGIOGRAPHY N/A 11/02/2017   Procedure: LEFT HEART CATH AND CORONARY ANGIOGRAPHY;  Surgeon: Burnell Blanks, MD;  Location: [facility];  Service: Cardiovascular;  Laterality: N/A;    Social History: Social History   Tobacco Use   Smoking status: Never   Smokeless tobacco: Never  Vaping Use   Vaping Use: Never used  Substance Use Topics   Alcohol use: Yes    Comment: occasional   Drug use: No   Additional social history: lives with her daughter in law and son   Please also refer to relevant sections of EMR.  Family History: Family History  Problem Relation Age of Onset   Dementia Mother    Alcoholism Mother    COPD Father    Diabetes Sister     Hypertension Sister    COPD Brother    Lung cancer Brother    Clotting disorder Brother    Kidney disease Neg Hx    Stroke Neg Hx    Allergies and Medications: Allergies  Allergen Reactions   Lisinopril Cough   Codeine Phosphate Itching   Morphine And Related Itching    Burning sensation and itchiness with IV morphine   No current facility-administered medications on file prior to encounter.   Current Outpatient Medications on File Prior to Encounter  Medication Sig Dispense Refill   acetaminophen (TYLENOL) 500 MG tablet Take 1,000 mg by mouth every 6 (six) hours as needed for moderate pain.     albuterol (VENTOLIN HFA) 108 (90 Base) MCG/ACT inhaler Inhale 2 puffs into the lungs every 6 (six) hours as needed for wheezing or shortness of breath. 18 g 6   amLODipine (NORVASC) 10 MG tablet Take 1 tablet (10 mg total) by mouth daily. 90 tablet 3   aspirin EC 81 MG tablet Take 81 mg by mouth every morning.      atorvastatin (LIPITOR) 80 MG tablet Take 1 tablet (80 mg total) by mouth daily. 90 tablet 3   carbamazepine (TEGRETOL) 200 MG tablet Take 0.5 tablets (100 mg total) by mouth  2 (two) times daily. 90 tablet 3   FLUoxetine (PROZAC) 40 MG capsule Take 1 capsule (40 mg total) by mouth daily. 90 capsule 3   fluticasone (FLONASE) 50 MCG/ACT nasal spray Place 2 sprays into both nostrils daily. 16 g 6   Fluticasone-Umeclidin-Vilant (TRELEGY ELLIPTA) 100-62.5-25 MCG/ACT AEPB Inhale 1 puff into the lungs daily. 60 each 6   furosemide (LASIX) 20 MG tablet TAKE 2 TABLETS BY MOUTH 2 TIMES DAILY. (Patient taking differently: Take 40 mg by mouth 2 (two) times daily.) 360 tablet 0   guaiFENesin-dextromethorphan (ROBITUSSIN DM) 100-10 MG/5ML syrup Take 10 mLs by mouth every 4 (four) hours as needed for cough.     levothyroxine (SYNTHROID) 50 MCG tablet Take 1 tablet (50 mcg total) by mouth daily before breakfast. 90 tablet 2   losartan (COZAAR) 50 MG tablet Take 1 tablet (50 mg total) by mouth at  bedtime. 90 tablet 3   metoprolol tartrate (LOPRESSOR) 25 MG tablet Take 1 tablet (25 mg total) by mouth 2 (two) times daily. 60 tablet 5   pantoprazole (PROTONIX) 40 MG tablet TAKE 1 TABLET BY MOUTH EVERY DAY (Patient taking differently: Take 40 mg by mouth daily.) 90 tablet 3   potassium chloride SA (KLOR-CON M) 20 MEQ tablet Take 1 tablet (20 mEq total) by mouth 2 (two) times daily. 180 tablet 3   Pseudoeph-Doxylamine-DM-APAP (NYQUIL PO) Take 5 mLs by mouth 2 (two) times daily as needed (congestion).     QUEtiapine (SEROQUEL) 50 MG tablet TAKE 1 TABLET BY MOUTH EVERY MORNING AND 2 TABLETS BY MOUTH EVERY EVENING (Patient taking differently: Take 50-100 mg by mouth See admin instructions. 50 mg in the morning  100 mg  in the evening) 180 tablet 0   diclofenac Sodium (VOLTAREN) 1 % GEL Apply 2 g topically 4 (four) times daily. (Patient not taking: Reported on 05/13/2022) 100 g 1   nystatin (MYCOSTATIN) 100000 UNIT/ML suspension Take 5 mLs (500,000 Units total) by mouth 4 (four) times daily. (Patient not taking: Reported on 05/13/2022) 120 mL 0    Objective: BP 116/68   Pulse 74   Temp 97.9 F (36.6 C) (Oral)   Resp (!) 22   SpO2 100%   Exam: General: Elderly female in no acute distress sitting upright in bed Eyes: No scleral icterus, extraocular muscles intact bilaterally ENTM: Dry mucous membranes, no oropharyngeal erythema or petechiae, no exudate visualized Neck: Normal range of motion, supple Cardiovascular: Regular rate and rhythm Respiratory: Patient is clear to auscultation bilaterally, did not auscultate wheezing no crackles at lung bases, patient with normal respiratory effort on room air breathing comfortably, she has no accessory muscle use, she is acyanotic Gastrointestinal: Soft, nontender and nondistended with normal bowel sounds MSK: Patient moves all extremities spontaneously with normal range of motion Derm: Patient has annular area of brown pigment surrounded by marks of  excoriation on the right thigh, there is no bleeding or purulent drainage Neuro: Alert and oriented x4 Psych: Cooperative, normal attention  Labs and Imaging: CBC BMET  Recent Labs  Lab 05/13/22 0232  WBC 9.6  HGB 10.2*  HCT 32.6*  PLT 337   Recent Labs  Lab 05/13/22 0232  NA 138  K 3.7  CL 107  CO2 19*  BUN 7*  CREATININE 1.20*  GLUCOSE 135*  CALCIUM 7.4*     EKG: Sinus rhythm similar to previous EKGs  Nodular opacities in the right perihilar region on chest x-ray  CT angio: Negative for PE, multifocal pneumonia, mildly prominent  perihilar lymph nodes    Eulis Foster, MD 05/13/2022, 7:17 AM PGY-3, [place] Intern pager: [phone], text pages welcome

## 2022-05-13 NOTE — ED Notes (Signed)
States thinks headache is from not eating

## 2022-05-13 NOTE — ED Notes (Signed)
Lannette Donath daughter [phone] requesting an update on the patient

## 2022-05-13 NOTE — Hospital Course (Addendum)
Erica Morris is a 76 y.o. female who was admitted to [facility] for COPD exacerbation secondary to CAP.  Community Acquired Pneumonia Patient presented with cough and shortness of breath. CT chest revealed multifocal lung opacities, likely secondary to multifocal pneumonia. She was treated with Azithromycin '500mg'$  x3 days and Ceftriaxone x1 dose followed by amoxicillin 1 g twice daily for 4 days. She was observed overnight due to significant dyspnea with ambulation. The following morning (day of discharge), patient maintained adequate oxygen saturation on room air both at rest and with ambulation.   COPD Exacerbation Secondary to above pneumonia. Treated with 1 dose of solumedrol followed by prednisone '40mg'$  daily x4 days. Maintained on home inhalers.  Hypomagnesemia Hypocalcemia Patient with calcium of 7.2 on admission, 7.7 on recheck; corrected calcium 8.3.  Magnesium 1.1, and repleted with 8 g IV.  Remainder of medical problems were chronic and stable.  Follow-up items: Recommend repeat CT chest in 4 to 6 weeks to ensure resolution, as prominent mediastinal and hilar lymph nodes were seen. Likely reactive but will need to ensure clearing after treatment. Recheck Mg, BMP Follow-up on right thigh lesion.  Given triamcinolone cream x1 inpatient. Follow-up on loose stools.  Patient given Metamucil for stool bulking.  Assess for improvements.

## 2022-05-13 NOTE — ED Notes (Signed)
Pt ambulatory to restroom with steady gait. Assisted back into bed, placed back on cardiac monitor and call bell within reach. Denies any further needs at this time.

## 2022-05-13 NOTE — ED Triage Notes (Signed)
Pt arrived by [facility] from home for cough since 6/1; was seen by her PCP and told most likely a viral infection. Wheezing and rhonchi reported. Given '125mg'$  Solumedrol, '10mg'$  Albuterol, 0.5 mg atrovent

## 2022-05-14 ENCOUNTER — Encounter (HOSPITAL_COMMUNITY): Payer: Self-pay | Admitting: Family Medicine

## 2022-05-14 ENCOUNTER — Ambulatory Visit: Payer: Medicare Other | Admitting: Family Medicine

## 2022-05-14 ENCOUNTER — Other Ambulatory Visit (HOSPITAL_COMMUNITY): Payer: Self-pay

## 2022-05-14 DIAGNOSIS — J189 Pneumonia, unspecified organism: Secondary | ICD-10-CM | POA: Diagnosis not present

## 2022-05-14 DIAGNOSIS — J441 Chronic obstructive pulmonary disease with (acute) exacerbation: Secondary | ICD-10-CM | POA: Diagnosis not present

## 2022-05-14 HISTORY — DX: Hypomagnesemia: E83.42

## 2022-05-14 LAB — MAGNESIUM: Magnesium: 1.1 mg/dL — ABNORMAL LOW (ref 1.7–2.4)

## 2022-05-14 LAB — COMPREHENSIVE METABOLIC PANEL
ALT: 10 U/L (ref 0–44)
AST: 11 U/L — ABNORMAL LOW (ref 15–41)
Albumin: 2.9 g/dL — ABNORMAL LOW (ref 3.5–5.0)
Alkaline Phosphatase: 100 U/L (ref 38–126)
Anion gap: 13 (ref 5–15)
BUN: 10 mg/dL (ref 8–23)
CO2: 23 mmol/L (ref 22–32)
Calcium: 7.7 mg/dL — ABNORMAL LOW (ref 8.9–10.3)
Chloride: 105 mmol/L (ref 98–111)
Creatinine, Ser: 1.33 mg/dL — ABNORMAL HIGH (ref 0.44–1.00)
GFR, Estimated: 42 mL/min — ABNORMAL LOW (ref >60)
Glucose, Bld: 99 mg/dL (ref 70–99)
Potassium: 3.5 mmol/L (ref 3.5–5.1)
Sodium: 141 mmol/L (ref 135–145)
Total Bilirubin: 0.6 mg/dL (ref 0.3–1.2)
Total Protein: 5.9 g/dL — ABNORMAL LOW (ref 6.5–8.1)

## 2022-05-14 MED ORDER — GUAIFENESIN ER 600 MG PO TB12: 600.0000 mg | ORAL_TABLET | Freq: Two times a day (BID) | ORAL | 0 refills | Status: AC

## 2022-05-14 MED ORDER — AMOXICILLIN 500 MG PO CAPS
1000.0000 mg | ORAL_CAPSULE | Freq: Two times a day (BID) | ORAL | 0 refills | Status: AC
  Filled 2022-05-14: qty 16, 4d supply, fill #0

## 2022-05-14 MED ORDER — QUETIAPINE FUMARATE 50 MG PO TABS: 100.0000 mg | ORAL_TABLET | Freq: Every day | ORAL | Status: DC

## 2022-05-14 MED ORDER — AZITHROMYCIN 500 MG PO TABS
500.0000 mg | ORAL_TABLET | Freq: Every day | ORAL | 0 refills | Status: AC
  Filled 2022-05-14: qty 1, 1d supply, fill #0

## 2022-05-14 MED ORDER — AMOXICILLIN 500 MG PO CAPS
1000.0000 mg | ORAL_CAPSULE | Freq: Two times a day (BID) | ORAL | Status: DC
Start: 2022-05-14 — End: 2022-05-14
  Administered 2022-05-14: 1000 mg via ORAL
  Filled 2022-05-14 (×2): qty 2

## 2022-05-14 MED ORDER — PSYLLIUM 95 % PO PACK
1.0000 | PACK | Freq: Every day | ORAL | 0 refills | Status: AC
  Filled 2022-05-14: qty 240, 240d supply, fill #0

## 2022-05-14 MED ORDER — TRIAMCINOLONE ACETONIDE 0.025 % EX CREA
TOPICAL_CREAM | Freq: Once | CUTANEOUS | Status: AC
  Filled 2022-05-14: qty 15

## 2022-05-14 MED ORDER — DEXTROSE 5 % IV SOLN
8.0000 g | Freq: Once | INTRAVENOUS | Status: AC
  Administered 2022-05-14: 8 g via INTRAVENOUS

## 2022-05-14 MED ORDER — PREDNISONE 20 MG PO TABS
40.0000 mg | ORAL_TABLET | Freq: Every day | ORAL | 0 refills | Status: AC
  Filled 2022-05-14: qty 6, 3d supply, fill #0

## 2022-05-14 MED ORDER — QUETIAPINE FUMARATE 50 MG PO TABS
50.0000 mg | ORAL_TABLET | Freq: Every morning | ORAL | Status: DC
  Administered 2022-05-14: 50 mg via ORAL
  Filled 2022-05-14: qty 2

## 2022-05-14 MED ORDER — MAGNESIUM SULFATE 50 % IJ SOLN
8.0000 g | Freq: Once | INTRAMUSCULAR | Status: DC
Start: 2022-05-14 — End: 2022-05-14
  Filled 2022-05-14: qty 16

## 2022-05-14 MED ORDER — GUAIFENESIN ER 600 MG PO TB12
600.0000 mg | ORAL_TABLET | Freq: Two times a day (BID) | ORAL | Status: DC
Start: 2022-05-14 — End: 2022-05-14
  Administered 2022-05-14: 600 mg via ORAL
  Filled 2022-05-14: qty 1

## 2022-05-14 MED ORDER — PSYLLIUM 95 % PO PACK
1.0000 | PACK | Freq: Every day | ORAL | Status: DC
  Administered 2022-05-14: 1 via ORAL
  Filled 2022-05-14: qty 1

## 2022-05-14 MED ORDER — MAGNESIUM SULFATE 4 GM/100ML IV SOLN
4.0000 g | Freq: Once | INTRAVENOUS | Status: DC
  Filled 2022-05-14: qty 100

## 2022-05-14 NOTE — Discharge Summary (Signed)
[facility] Discharge Summary  Patient name: Erica Morris Medical record number: [id number] Date of birth: 1946-04-02 Age: 76 y.o. Gender: female Date of Admission: 05/13/2022  Date of Discharge: 05/14/2022 Admitting Physician: Blane Ohara McDiarmid, MD  Primary Care Provider: Zenia Resides, MD Consultants: None  Indication for Hospitalization: 2 weeks of dry cough associated with wheezing and shortness of breath, found to have multifocal pneumonia on imaging which is likely multifocal with concurrent COPD exacerbation.  Discharge Diagnoses/Problem List:  Multifocal pneumonia COPD exacerbation Hypomagnesemia  Disposition: Home  Discharge Condition: Stable  Discharge Exam: Patient reports feeling better today issues breathing.  She continues to report a cough and some exertional dyspnea.  She also reports some loose and watery stools, but is unclear as to how long this has been ongoing.  The stools are nonbloody, and patient denies N/V, and abdominal pain.  She has not had any recent antibiotic use per chart, nor history of immunocompromise or taking any immunosuppressive medications that would make her more susceptible to C. difficile infection.  She initially reports that these episodes started after eating meat, but later changes her story.  Brief Hospital Course:  Erica Morris is a 76 y.o. female who was admitted to [facility] for COPD exacerbation secondary to CAP.  Community Acquired Pneumonia Patient presented with cough and shortness of breath. CT chest revealed multifocal lung opacities, likely secondary to multifocal pneumonia. She was treated with Azithromycin '500mg'$  x3 days and Ceftriaxone x1 dose followed by amoxicillin 1 g twice daily for 4 days. She was observed overnight due to significant dyspnea with ambulation. The following morning (day of discharge), patient maintained adequate oxygen saturation on room air both at rest and with  ambulation.   COPD Exacerbation Secondary to above pneumonia. Treated with 1 dose of solumedrol followed by prednisone '40mg'$  daily x4 days. Maintained on home inhalers.  Hypomagnesemia Hypocalcemia Patient with calcium of 7.2 on admission, 7.7 on recheck; corrected calcium 8.3.  Magnesium 1.1, and repleted with 8 g IV.  Remainder of medical problems were chronic and stable.  Follow-up items: Recommend repeat CT chest in 4 to 6 weeks to ensure resolution, as prominent mediastinal and hilar lymph nodes were seen. Likely reactive but will need to ensure clearing after treatment. Recheck Mg, BMP Follow-up on right thigh lesion.  Given triamcinolone cream x1 inpatient. Follow-up on loose stools.  Patient given Metamucil for stool bulking.  Assess for improvements.    Significant Procedures: N/A  Significant Labs and Imaging:  Recent Labs  Lab 05/13/22 0232  WBC 9.6  HGB 10.2*  HCT 32.6*  PLT 337   Recent Labs  Lab 05/13/22 0232 05/14/22 0440  NA 138 141  K 3.7 3.5  CL 107 105  CO2 19* 23  GLUCOSE 135* 99  BUN 7* 10  CREATININE 1.20* 1.33*  CALCIUM 7.4* 7.7*  MG  --  1.1*  ALKPHOS  --  100  AST  --  11*  ALT  --  10  ALBUMIN  --  2.9*      Results/Tests Pending at Time of Discharge: N/A  Discharge Medications:  Allergies as of 05/14/2022       Reactions   Lisinopril Cough   Codeine Phosphate Itching   Morphine And Related Itching   Burning sensation and itchiness with IV morphine        Medication List     STOP taking these medications    NYQUIL PO  TAKE these medications    acetaminophen 500 MG tablet Commonly known as: TYLENOL Take 1,000 mg by mouth every 6 (six) hours as needed for moderate pain.   albuterol 108 (90 Base) MCG/ACT inhaler Commonly known as: VENTOLIN HFA Inhale 2 puffs into the lungs every 6 (six) hours as needed for wheezing or shortness of breath.   amLODipine 10 MG tablet Commonly known as: NORVASC Take 1 tablet  (10 mg total) by mouth daily.   amoxicillin 500 MG capsule Commonly known as: AMOXIL Take 2 capsules (1,000 mg total) by mouth every 12 (twelve) hours for 4 days.   aspirin EC 81 MG tablet Take 81 mg by mouth every morning.   atorvastatin 80 MG tablet Commonly known as: LIPITOR Take 1 tablet (80 mg total) by mouth daily.   azithromycin 500 MG tablet Commonly known as: ZITHROMAX Take 1 tablet (500 mg total) by mouth daily for 1 day. Start taking on: May 15, 2022   carbamazepine 200 MG tablet Commonly known as: TEGretol Take 0.5 tablets (100 mg total) by mouth 2 (two) times daily.   FLUoxetine 40 MG capsule Commonly known as: PROZAC Take 1 capsule (40 mg total) by mouth daily.   fluticasone 50 MCG/ACT nasal spray Commonly known as: FLONASE Place 2 sprays into both nostrils daily.   furosemide 20 MG tablet Commonly known as: LASIX TAKE 2 TABLETS BY MOUTH 2 TIMES DAILY. What changed: when to take this   guaiFENesin 600 MG 12 hr tablet Commonly known as: MUCINEX Take 1 tablet (600 mg total) by mouth 2 (two) times daily for 7 days.   guaiFENesin-dextromethorphan 100-10 MG/5ML syrup Commonly known as: ROBITUSSIN DM Take 10 mLs by mouth every 4 (four) hours as needed for cough.   levothyroxine 50 MCG tablet Commonly known as: SYNTHROID Take 1 tablet (50 mcg total) by mouth daily before breakfast.   losartan 50 MG tablet Commonly known as: COZAAR Take 1 tablet (50 mg total) by mouth at bedtime.   metoprolol tartrate 25 MG tablet Commonly known as: LOPRESSOR Take 1 tablet (25 mg total) by mouth 2 (two) times daily.   pantoprazole 40 MG tablet Commonly known as: PROTONIX TAKE 1 TABLET BY MOUTH EVERY DAY   potassium chloride SA 20 MEQ tablet Commonly known as: KLOR-CON M Take 1 tablet (20 mEq total) by mouth 2 (two) times daily.   predniSONE 20 MG tablet Commonly known as: DELTASONE Take 2 tablets (40 mg total) by mouth daily with breakfast for 3 days. Start  taking on: May 15, 2022   psyllium 95 % Pack Commonly known as: HYDROCIL/METAMUCIL Take 1 packet by mouth daily.   QUEtiapine 50 MG tablet Commonly known as: SEROQUEL TAKE 1 TABLET BY MOUTH EVERY MORNING AND 2 TABLETS BY MOUTH EVERY EVENING What changed: See the new instructions.   Trelegy Ellipta 100-62.5-25 MCG/ACT Aepb Generic drug: Fluticasone-Umeclidin-Vilant Inhale 1 puff into the lungs daily.        Discharge Instructions: Please refer to Patient Instructions section of EMR for full details.  Patient was counseled important signs and symptoms that should prompt return to medical care, changes in medications, dietary instructions, activity restrictions, and follow up appointments.   Follow-Up Appointments:   Rosezetta Schlatter, MD 05/14/2022, 1:43 PM PGY-1, [place]

## 2022-05-14 NOTE — Assessment & Plan Note (Addendum)
>>  ASSESSMENT AND PLAN FOR COPD (CHRONIC OBSTRUCTIVE PULMONARY DISEASE) (HCC) WRITTEN ON 05/14/2022  1:45 PM BY Lagina Reader, MD  Secondary to above pneumonia. Treated with 1 dose of solumedrol followed by prednisone 40mg  daily x4 days. Maintained on home inhalers.  >>ASSESSMENT AND PLAN FOR HYPOXIA, EXERTIONAL WRITTEN ON 05/14/2022  8:22 AM BY Nely Dedmon, MD  Subjective hypoxia. When ambulating with PT, O2 sats maintained between 99 to 100% on room air.

## 2022-05-14 NOTE — Assessment & Plan Note (Signed)
Subjective hypoxia. When ambulating with PT, O2 sats maintained between 99 to 100% on room air.

## 2022-05-14 NOTE — Progress Notes (Signed)
Patient arrived to [place] room 5 alert and oriented x4. Pain level is 0/10. Bed in lowest position. Call light in reach. Will continue to monitor pt.

## 2022-05-14 NOTE — Assessment & Plan Note (Signed)
Mg 1.1, given 8g IV to replete.

## 2022-05-14 NOTE — Progress Notes (Signed)
FPTS Brief Progress Note  S:Went bedside to see patient. Patient complaining of cough, but notes she's been getting breathing treatments to help.    O: BP 132/68 (BP Location: Right Arm)   Pulse (!) 57   Temp 97.9 F (36.6 C) (Oral)   Resp (!) 24   SpO2 92%   General: NAD, conversant, good mood Resp: Good work of breathing  A/P: CAP Patient appears stable, afebrile. Will continue to monitor. - Plans per day team - Orders reviewed. Labs for AM ordered, which was adjusted as needed.   Holley Bouche, MD 05/14/2022, 1:47 AM PGY-1, [facility] Night Resident  Please page [phone] with questions.

## 2022-05-14 NOTE — Assessment & Plan Note (Addendum)
Patient presented with cough and shortness of breath. CT chest revealed multifocal lung opacities, likely secondary to multifocal pneumonia. She was treated with Azithromycin '500mg'$  x3 days and Ceftriaxone x1 dose followed by amoxicillin 1 g twice daily for 4 days. She was observed overnight due to significant dyspnea with ambulation. The following morning (day of discharge), patient maintained adequate oxygen saturation on room air both at rest and with ambulation.

## 2022-05-18 ENCOUNTER — Ambulatory Visit (INDEPENDENT_AMBULATORY_CARE_PROVIDER_SITE_OTHER): Payer: Medicare Other | Admitting: Family Medicine

## 2022-05-18 ENCOUNTER — Encounter: Payer: Self-pay | Admitting: Family Medicine

## 2022-05-18 DIAGNOSIS — N1831 Chronic kidney disease, stage 3a: Secondary | ICD-10-CM

## 2022-05-18 DIAGNOSIS — K921 Melena: Secondary | ICD-10-CM | POA: Diagnosis not present

## 2022-05-18 DIAGNOSIS — J189 Pneumonia, unspecified organism: Secondary | ICD-10-CM

## 2022-05-18 DIAGNOSIS — B3731 Acute candidiasis of vulva and vagina: Secondary | ICD-10-CM

## 2022-05-18 DIAGNOSIS — Z79899 Other long term (current) drug therapy: Secondary | ICD-10-CM | POA: Diagnosis not present

## 2022-05-18 DIAGNOSIS — Z86010 Personal history of colonic polyps: Secondary | ICD-10-CM

## 2022-05-18 DIAGNOSIS — Z860101 Personal history of adenomatous and serrated colon polyps: Secondary | ICD-10-CM

## 2022-05-18 DIAGNOSIS — D649 Anemia, unspecified: Secondary | ICD-10-CM | POA: Insufficient documentation

## 2022-05-18 MED ORDER — FLUCONAZOLE 150 MG PO TABS: 150.0000 mg | ORAL_TABLET | Freq: Once | ORAL | 0 refills | Status: AC

## 2022-05-18 MED ORDER — BENZONATATE 200 MG PO CAPS: 200.0000 mg | ORAL_CAPSULE | Freq: Two times a day (BID) | ORAL | 0 refills | Status: DC | PRN

## 2022-05-18 NOTE — Assessment & Plan Note (Signed)
Reviewed vital meds on AVS.  She will go home and check meds.  Also bring meds next visit.

## 2022-05-18 NOTE — Assessment & Plan Note (Signed)
Cough med per patient request.  Seems to be recovering well.  Needs FU CT end of July.

## 2022-05-18 NOTE — Assessment & Plan Note (Signed)
Check CBC 

## 2022-05-18 NOTE — Assessment & Plan Note (Signed)
>>  ASSESSMENT AND PLAN FOR BLACK STOOLS WRITTEN ON 05/18/2022 10:44 AM BY HENSEL, Santiago Bumpers, MD  Check CBC

## 2022-05-18 NOTE — Assessment & Plan Note (Signed)
GI referral for repeat colonoscopy

## 2022-05-18 NOTE — Assessment & Plan Note (Signed)
Recheck BMP.  Consider adding SGLT2

## 2022-05-18 NOTE — Progress Notes (Signed)
    SUBJECTIVE:   CHIEF COMPLAINT / HPI:   FU hospitalization for pneumonia. Pneumonia.  No fever.  Mild SOB.  Still cough.  Now back living with son and daughter-in-law, both of whom smoke in the house.  Worried about second hand smoke causing worsening of lung issues.  Will need CT FU in 6 weeks per DC summary. Other issues: CKD 3a.  Needs BMP Hypomag - treated, needs recheck.  Polypharm.  Unclear of which meds she is taking.  Unclear what refills she needs.   C/O Black stools.  No pain.  Some loose stools.  HGb slightly low in hospital.  Supposedly on PPI.  Due for colonoscopy with hx of colon polyps.  No lightheadedness, CP or SOB 5. Reports vag itching post antibiotics   OBJECTIVE:   BP 114/63   Pulse (!) 53   Ht '5\' 4"'$  (1.626 m)   Wt 197 lb 3.2 oz (89.4 kg)   SpO2 100%   BMI 33.85 kg/m   No resp distress.  Normal work of breathing. Lungs clear. Cardiac RRR without m or g Abd benigh.  ASSESSMENT/PLAN:   Polypharmacy Reviewed vital meds on AVS.  She will go home and check meds.  Also bring meds next visit.  Yeast vaginitis Empiric treatment of yeast vaginitis.  Stage 3a chronic kidney disease (CKD) (HCC) Recheck BMP.  Consider adding SGLT2  Hx of adenomatous colonic polyps GI referral for repeat colonoscopy  Black stools Check CBC  Multifocal pneumonia Cough med per patient request.  Seems to be recovering well.  Needs FU CT end of July.     Zenia Resides, MD [place]

## 2022-05-18 NOTE — Patient Instructions (Signed)
See me in three weeks.  I will order the follow up CT scan at that visit. Someone should call about the GI referral.  You are due for a colonoscopy.  With the black bowel movements, it is very important. I will call with the blood test results. Check your medicine list at home and have the pharmacy send me requests for refills of anything you are out of.

## 2022-05-18 NOTE — Assessment & Plan Note (Signed)
Empiric treatment of yeast vaginitis.

## 2022-05-19 ENCOUNTER — Telehealth: Payer: Self-pay | Admitting: Family Medicine

## 2022-05-19 LAB — BASIC METABOLIC PANEL
BUN/Creatinine Ratio: 13 (ref 12–28)
BUN: 15 mg/dL (ref 8–27)
CO2: 19 mmol/L — ABNORMAL LOW (ref 20–29)
Calcium: 8.4 mg/dL — ABNORMAL LOW (ref 8.7–10.3)
Chloride: 107 mmol/L — ABNORMAL HIGH (ref 96–106)
Creatinine, Ser: 1.18 mg/dL — ABNORMAL HIGH (ref 0.57–1.00)
Glucose: 98 mg/dL (ref 70–99)
Potassium: 4.4 mmol/L (ref 3.5–5.2)
Sodium: 140 mmol/L (ref 134–144)
eGFR: 48 mL/min/{1.73_m2} — ABNORMAL LOW (ref >59)

## 2022-05-19 LAB — CBC
Hematocrit: 35.3 % (ref 34.0–46.6)
Hemoglobin: 11.3 g/dL (ref 11.1–15.9)
MCH: 26.5 pg — ABNORMAL LOW (ref 26.6–33.0)
MCHC: 32 g/dL (ref 31.5–35.7)
MCV: 83 fL (ref 79–97)
Platelets: 325 10*3/uL (ref 150–450)
RBC: 4.26 x10E6/uL (ref 3.77–5.28)
RDW: 13.1 % (ref 11.7–15.4)
WBC: 10.6 10*3/uL (ref 3.4–10.8)

## 2022-05-19 LAB — MAGNESIUM: Magnesium: 1.4 mg/dL — ABNORMAL LOW (ref 1.6–2.3)

## 2022-05-19 MED ORDER — MAGNESIUM OXIDE 400 MG PO CAPS: 400.0000 mg | ORAL_CAPSULE | Freq: Every day | ORAL | 3 refills | Status: DC

## 2022-05-19 NOTE — Telephone Encounter (Signed)
Mag still low.  Informed patient and prescribed daily oral Mag.

## 2022-05-21 ENCOUNTER — Telehealth: Payer: Self-pay

## 2022-05-23 ENCOUNTER — Other Ambulatory Visit: Payer: Self-pay | Admitting: Student

## 2022-05-23 DIAGNOSIS — F339 Major depressive disorder, recurrent, unspecified: Secondary | ICD-10-CM

## 2022-06-03 ENCOUNTER — Encounter (INDEPENDENT_AMBULATORY_CARE_PROVIDER_SITE_OTHER): Payer: Medicare Other | Admitting: Ophthalmology

## 2022-06-08 ENCOUNTER — Ambulatory Visit (INDEPENDENT_AMBULATORY_CARE_PROVIDER_SITE_OTHER): Payer: Medicare Other | Admitting: Family Medicine

## 2022-06-08 ENCOUNTER — Other Ambulatory Visit: Payer: Self-pay | Admitting: Family Medicine

## 2022-06-08 ENCOUNTER — Encounter: Payer: Self-pay | Admitting: Family Medicine

## 2022-06-08 ENCOUNTER — Other Ambulatory Visit: Payer: Self-pay

## 2022-06-08 DIAGNOSIS — G43009 Migraine without aura, not intractable, without status migrainosus: Secondary | ICD-10-CM | POA: Diagnosis not present

## 2022-06-08 DIAGNOSIS — F339 Major depressive disorder, recurrent, unspecified: Secondary | ICD-10-CM

## 2022-06-08 DIAGNOSIS — K219 Gastro-esophageal reflux disease without esophagitis: Secondary | ICD-10-CM

## 2022-06-08 DIAGNOSIS — J449 Chronic obstructive pulmonary disease, unspecified: Secondary | ICD-10-CM | POA: Diagnosis not present

## 2022-06-08 DIAGNOSIS — F411 Generalized anxiety disorder: Secondary | ICD-10-CM

## 2022-06-08 DIAGNOSIS — I10 Essential (primary) hypertension: Secondary | ICD-10-CM

## 2022-06-08 DIAGNOSIS — E039 Hypothyroidism, unspecified: Secondary | ICD-10-CM

## 2022-06-08 DIAGNOSIS — R052 Subacute cough: Secondary | ICD-10-CM

## 2022-06-08 MED ORDER — PANTOPRAZOLE SODIUM 40 MG PO TBEC: 40.0000 mg | DELAYED_RELEASE_TABLET | Freq: Every day | ORAL | 3 refills | Status: DC

## 2022-06-08 MED ORDER — ESCITALOPRAM OXALATE 20 MG PO TABS: 20.0000 mg | ORAL_TABLET | Freq: Every day | ORAL | 3 refills | Status: DC

## 2022-06-08 MED ORDER — ALBUTEROL SULFATE HFA 108 (90 BASE) MCG/ACT IN AERS: 2.0000 | INHALATION_SPRAY | Freq: Four times a day (QID) | RESPIRATORY_TRACT | 6 refills | Status: DC | PRN

## 2022-06-08 MED ORDER — LEVOTHYROXINE SODIUM 50 MCG PO TABS: 50.0000 ug | ORAL_TABLET | Freq: Every day | ORAL | 3 refills | Status: DC

## 2022-06-08 MED ORDER — CARBAMAZEPINE 200 MG PO TABS: 100.0000 mg | ORAL_TABLET | Freq: Two times a day (BID) | ORAL | 3 refills | Status: DC

## 2022-06-08 MED ORDER — TRELEGY ELLIPTA 100-62.5-25 MCG/ACT IN AEPB: 1.0000 | INHALATION_SPRAY | Freq: Every day | RESPIRATORY_TRACT | 6 refills | Status: DC

## 2022-06-08 MED ORDER — QUETIAPINE FUMARATE 50 MG PO TABS: ORAL_TABLET | ORAL | 3 refills | Status: DC

## 2022-06-08 NOTE — Telephone Encounter (Signed)
Patient's daughter in law, Lenna Sciara, calls nurse line regarding issues with tegretol prescription.   [facility] [facility]. They never received prescription. Resent prescription as originally written to [facility].   Talbot Grumbling, RN

## 2022-06-08 NOTE — Patient Instructions (Addendum)
As soon as you are over the cough, please make an appointment with Dr. Valentina Lucks for lung function tests.  Stop the prozac one day and start taking the lexapro the next day.  I will call with the results of the CT scan.

## 2022-06-09 ENCOUNTER — Encounter: Payer: Self-pay | Admitting: Family Medicine

## 2022-06-09 NOTE — Assessment & Plan Note (Signed)
Will switch from prozac to lexapro.  Given both SSRI, no wean needed.

## 2022-06-09 NOTE — Progress Notes (Signed)
    SUBJECTIVE:   CHIEF COMPLAINT / HPI:   Continued non productive cough.  Per patient, not improving, maybe worse.  Hospitalized mid June for multifocal pneumonia.  Had (reactive?) mediastinal lymphadenopathy noted.  FU CT was recommended in 4-6 weeks: it has been four weeks. I am also considering likely COPD.  She never smoked but has been exposed to passive smoke all her life.  Currently on trelegy. Intend to get PFTs after she fully recovers from the pneumonia.   Denies fever.  Some SOB.   Anxiety.  States prozac no longer helping.  Asks for change.      OBJECTIVE:   BP (!) 139/59   Pulse (!) 51   Ht '5\' 4"'$  (1.626 m)   Wt 207 lb 3.2 oz (94 kg)   SpO2 99%   BMI 35.57 kg/m   Neck, no adenopathy.   Lungs clear Cardiac RRR without m or g Ext no edema.  ASSESSMENT/PLAN:   Cough Will go ahead with follow up CT now.  Mixed picture:  COPD?  Post infectious cough?  Hopefully, the repeat CT will help clarify.    Anxiety state Will switch from prozac to lexapro.  Given both SSRI, no wean needed.     Zenia Resides, MD [place]

## 2022-06-09 NOTE — Assessment & Plan Note (Signed)
Will go ahead with follow up CT now.  Mixed picture:  COPD?  Post infectious cough?  Hopefully, the repeat CT will help clarify.

## 2022-07-02 ENCOUNTER — Other Ambulatory Visit: Payer: Medicare Other

## 2022-07-02 ENCOUNTER — Ambulatory Visit
Admission: RE | Admit: 2022-07-02 | Discharge: 2022-07-02 | Disposition: A | Discharge status: Home / Self Care | Payer: Medicare Other | Source: Ambulatory Visit | Attending: Family Medicine | Admitting: Family Medicine

## 2022-07-02 DIAGNOSIS — I2510 Atherosclerotic heart disease of native coronary artery without angina pectoris: Secondary | ICD-10-CM | POA: Diagnosis not present

## 2022-07-02 DIAGNOSIS — K449 Diaphragmatic hernia without obstruction or gangrene: Secondary | ICD-10-CM | POA: Diagnosis not present

## 2022-07-02 DIAGNOSIS — R590 Localized enlarged lymph nodes: Secondary | ICD-10-CM | POA: Diagnosis not present

## 2022-07-02 DIAGNOSIS — R052 Subacute cough: Secondary | ICD-10-CM

## 2022-07-04 ENCOUNTER — Other Ambulatory Visit: Payer: Self-pay | Admitting: Family Medicine

## 2022-07-04 DIAGNOSIS — I5032 Chronic diastolic (congestive) heart failure: Secondary | ICD-10-CM

## 2022-07-04 DIAGNOSIS — I50810 Right heart failure, unspecified: Secondary | ICD-10-CM

## 2022-07-14 ENCOUNTER — Other Ambulatory Visit: Payer: Self-pay

## 2022-07-14 DIAGNOSIS — I5032 Chronic diastolic (congestive) heart failure: Secondary | ICD-10-CM

## 2022-07-14 MED ORDER — METOPROLOL TARTRATE 25 MG PO TABS: 25.0000 mg | ORAL_TABLET | Freq: Two times a day (BID) | ORAL | 3 refills | Status: DC

## 2022-08-28 ENCOUNTER — Encounter: Payer: Self-pay | Admitting: Family Medicine

## 2022-08-28 ENCOUNTER — Ambulatory Visit (INDEPENDENT_AMBULATORY_CARE_PROVIDER_SITE_OTHER): Payer: Medicare Other | Admitting: Family Medicine

## 2022-08-28 VITALS — BP 145/72 | HR 53 | Ht 64.0 in | Wt 204.4 lb

## 2022-08-28 DIAGNOSIS — R21 Rash and other nonspecific skin eruption: Secondary | ICD-10-CM | POA: Diagnosis not present

## 2022-08-28 DIAGNOSIS — Z23 Encounter for immunization: Secondary | ICD-10-CM | POA: Diagnosis not present

## 2022-08-28 MED ORDER — HALOBETASOL PROPIONATE 0.05 % EX OINT: TOPICAL_OINTMENT | Freq: Two times a day (BID) | CUTANEOUS | 0 refills | Status: DC

## 2022-08-28 NOTE — Patient Instructions (Addendum)
It was nice seeing you today!  Try halobetasol ointment twice a day, which is a stronger topical steroid.  Schedule an appointment with the [facility] at the front desk in about 2-3 weeks. If not getting better, we may need to get a biopsy.  Stay well, Zola Button, MD [place] [phone]  --  Make sure to check out at the front desk before you leave today.  Please arrive at least 15 minutes prior to your scheduled appointments.  If you had blood work today, I will send you a MyChart message or a letter if results are normal. Otherwise, I will give you a call.  If you had a referral placed, they will call you to set up an appointment. Please give [place] a call if you don't hear back in the next 2 weeks.  If you need additional refills before your next appointment, please call your pharmacy first.

## 2022-08-28 NOTE — Progress Notes (Signed)
    SUBJECTIVE:   CHIEF COMPLAINT / HPI:  Chief Complaint  Patient presents with   Rash    Patient presents today with 2 separate rashes.  One rash that she has had on the right thigh has been present for over 3 months, first noticed when she was at the hospital in June.  Another rash she is noted more recently about 3 weeks ago in the right posterior lower leg.  While she was hospitalized, she was given triamcinolone which she had been using but did not really help.  She is out of triamcinolone now.  She is also tried various things such as peroxide, alcohol, over-the-counter anti-itch medications without significant relief.  She is having significant itching and has been scratching at the areas.  Also feeling some stinging.  Denies any new soaps, detergents, etc.  No new medications.  States that she does not go outside much.  PERTINENT  PMH / PSH: Venous insufficiency, peripheral neuropathy, CHF, CKD 3A  Patient Care Team: Zenia Resides, MD as PCP - General (Family Medicine) Clent Jacks, MD as Consulting Physician (Ophthalmology)   OBJECTIVE:   BP (!) 145/72   Pulse (!) 53   Ht '5\' 4"'$  (1.626 m)   Wt 204 lb 6.4 oz (92.7 kg)   SpO2 99%   BMI 35.09 kg/m   Physical Exam Constitutional:      General: She is not in acute distress. HENT:     Head: Normocephalic and atraumatic.     Mouth/Throat:     Mouth: Mucous membranes are moist.     Pharynx: Oropharynx is clear.     Comments: No oral lesions Cardiovascular:     Rate and Rhythm: Bradycardia present.  Pulmonary:     Effort: Pulmonary effort is normal. No respiratory distress.  Musculoskeletal:     Cervical back: Neck supple.  Skin:    General: Skin is warm and dry.     Comments: Right anterior thigh peripheral large erythematous rough patch with areas of excoriation as well as mild hyperpigmentation.  Right posterior lower leg with large area of rough erythematous patch with coalescing papules.  Neurological:      Mental Status: She is alert.            08/28/2022    1:28 PM  Depression screen PHQ 2/9  Decreased Interest 3  Down, Depressed, Hopeless 2  PHQ - 2 Score 5  Altered sleeping 1  Tired, decreased energy 1  Change in appetite 3  Feeling bad or failure about yourself  0  Trouble concentrating 0  Moving slowly or fidgety/restless 0  Suicidal thoughts 0  PHQ-9 Score 10     {Show previous vital signs (optional):23777}    ASSESSMENT/PLAN:   1. Rash and nonspecific skin eruption Etiology of rash is unclear, appearance is somewhat like contact dermatitis though no known triggers or clear exposures.  Not improving with triamcinolone.  Will trial stronger topical corticosteroid, consider biopsy if not improving. - halobetasol (ULTRAVATE) 0.05 % ointment; Apply topically 2 (two) times daily.  Dispense: 50 g; Refill: 0  2. Need for immunization against influenza - Flu Vaccine QUAD High Dose(Fluad)   Return in about 2 weeks (around 09/11/2022) for f/u rash [facility] dermatology clinic - may need biopsy.   Zola Button, MD [place]

## 2022-09-17 ENCOUNTER — Ambulatory Visit: Payer: Medicare Other

## 2022-09-23 ENCOUNTER — Telehealth: Payer: Self-pay | Admitting: Family Medicine

## 2022-09-23 NOTE — Telephone Encounter (Signed)
Call to schedule AWV, please let me know if patient calls back to schedule. You can send me a secure chat or teams with the following:  Morning or Afternoon appointment  In person visit or Over the phone.   If I am not available please send message with what they would prefer and I can send you date time back.   Thanks!  

## 2022-09-24 ENCOUNTER — Ambulatory Visit: Payer: Medicare Other

## 2022-10-01 ENCOUNTER — Ambulatory Visit: Payer: Medicare Other

## 2022-10-15 ENCOUNTER — Ambulatory Visit (INDEPENDENT_AMBULATORY_CARE_PROVIDER_SITE_OTHER): Payer: Medicare Other | Admitting: Family Medicine

## 2022-10-15 VITALS — BP 119/65 | HR 59 | Wt 207.2 lb

## 2022-10-15 DIAGNOSIS — R21 Rash and other nonspecific skin eruption: Secondary | ICD-10-CM | POA: Diagnosis not present

## 2022-10-15 MED ORDER — HALOBETASOL PROPIONATE 0.05 % EX OINT: TOPICAL_OINTMENT | CUTANEOUS | 1 refills | Status: DC

## 2022-10-15 NOTE — Patient Instructions (Addendum)
It was wonderful to see you today.  Please bring ALL of your medications with you to every visit.   Updates from today's visit:  Please use the halobetasol ointment as prescribed. Once a day for 2 weeks, then every other day for 2 weeks, then use it as needed.   Please follow up if your rash becomes much worse or you start to feel severe pain or fevers  Thank you for choosing [facility].   Please call [phone] with any questions about today's appointment.  Please be sure to schedule follow up at the front  desk before you leave today.   August Albino, MD  Family Medicine

## 2022-10-15 NOTE — Assessment & Plan Note (Addendum)
Posterior R leg rash, pruritic, improved after about 1.10motreatment with BID halobetasol ointment. Previously not responsive to triamcinolone. Suspect likely eczema, no systemic symptoms and no tenderness/warmth to the area. Given improvement with halobetasol, recommend continued use of this with a taper as below. Will not biopsy today as the rash has improved and there would be a low likelihood of positive results - may consider biopsy if rash worsens after treatment

## 2022-10-15 NOTE — Progress Notes (Signed)
  SUBJECTIVE:   CHIEF COMPLAINT / HPI:   Rash on back of right lower leg -Ongoing x1 year -Red, itchy, not painful, not bleeding, no drainage.  Denies fevers, joint pain, rash in other areas. -Has not worsened or increase in size or appearance -Did not improve initially with triamcinolone -At last visit with Dr. Nancy Fetter, was prescribed halobetasol which she has been using twice a day with improvement.  Her rash nearly resolved with the use of halobetasol, but she ran out of this ointment yesterday resulting in the return of her rash. -Despite recurrence, appearance has improved greatly since last visit in September    OBJECTIVE:  BP 119/65   Pulse (!) 59   Wt 207 lb 3.2 oz (94 kg)   SpO2 97%   BMI 35.57 kg/m   General: NAD, pleasant, able to participate in exam Skin: Poorly demarcated erythematous blanching macular rash over posterior right leg below calf.  Nontender to palpation. No significant warmth to the area. No excoriations noted.  Appears greatly improved from prior visit on 08/28/2022 (additional images in media tab)      ASSESSMENT/PLAN:   Rash and nonspecific skin eruption Assessment & Plan: Posterior R leg rash, pruritic, improved after about 1.24motreatment with BID halobetasol ointment. Previously not responsive to triamcinolone. Suspect likely eczema, no systemic symptoms and no tenderness/warmth to the area. Given improvement with halobetasol, recommend continued use of this with a taper as below. Will not biopsy today as the rash has improved and there would be a low likelihood of positive results - may consider biopsy if rash worsens after treatment   Orders: -     Halobetasol Propionate; Apply 1 Application topically daily for 14 days, THEN 1 Application every other day for 14 days, THEN 1 Application as needed.  Dispense: 50 g; Refill: 1   Meds ordered this encounter  Medications   halobetasol (ULTRAVATE) 0.05 % ointment    Sig: Apply 1 Application topically  daily for 14 days, THEN 1 Application every other day for 14 days, THEN 1 Application as needed.    Dispense:  50 g    Refill:  1   Return if symptoms worsen or fail to improve.  AAugust Albino MD [facility]

## 2022-10-18 ENCOUNTER — Other Ambulatory Visit: Payer: Self-pay | Admitting: Student

## 2022-10-18 DIAGNOSIS — E876 Hypokalemia: Secondary | ICD-10-CM

## 2022-10-25 ENCOUNTER — Other Ambulatory Visit: Payer: Self-pay | Admitting: Student

## 2022-10-25 DIAGNOSIS — I10 Essential (primary) hypertension: Secondary | ICD-10-CM

## 2022-10-25 DIAGNOSIS — J449 Chronic obstructive pulmonary disease, unspecified: Secondary | ICD-10-CM

## 2022-10-25 DIAGNOSIS — F410 Panic disorder [episodic paroxysmal anxiety] without agoraphobia: Secondary | ICD-10-CM

## 2023-01-05 ENCOUNTER — Encounter: Payer: Self-pay | Admitting: Family Medicine

## 2023-01-08 ENCOUNTER — Ambulatory Visit (INDEPENDENT_AMBULATORY_CARE_PROVIDER_SITE_OTHER): Payer: 59 | Admitting: Family Medicine

## 2023-01-08 VITALS — BP 136/66 | HR 60 | Wt 194.0 lb

## 2023-01-08 DIAGNOSIS — B372 Candidiasis of skin and nail: Secondary | ICD-10-CM

## 2023-01-08 DIAGNOSIS — Z23 Encounter for immunization: Secondary | ICD-10-CM

## 2023-01-08 DIAGNOSIS — Z0000 Encounter for general adult medical examination without abnormal findings: Secondary | ICD-10-CM | POA: Diagnosis not present

## 2023-01-08 DIAGNOSIS — B370 Candidal stomatitis: Secondary | ICD-10-CM

## 2023-01-08 MED ORDER — ZOSTER VAC RECOMB ADJUVANTED 50 MCG/0.5ML IM SUSR: 0.5000 mL | Freq: Once | INTRAMUSCULAR | 1 refills | Status: AC

## 2023-01-08 MED ORDER — NYSTATIN 100000 UNIT/ML MT SUSP: 5.0000 mL | Freq: Three times a day (TID) | OROMUCOSAL | 0 refills | Status: AC

## 2023-01-08 MED ORDER — NYSTATIN 100000 UNIT/GM EX POWD: 1.0000 | Freq: Two times a day (BID) | CUTANEOUS | 3 refills | Status: DC

## 2023-01-08 NOTE — Progress Notes (Unsigned)
SUBJECTIVE:   CHIEF COMPLAINT / HPI:   Rash [name] is a pleasant 77 year old woman who presents today with complaint of rash for 2 weeks.  She reports itchy uncomfortable rash in bilateral inguinal areas and also her gluteal cleft.  She has tried medicated over-the-counter powders without much success.  Denies swelling or discharge.  Has not felt any enlarged lymph nodes in her axilla or inguinal areas.  No fevers, chills, weight loss, or night sweats.  Mouth complaint, lesions Patient also reports painful mouth lesions.  Cannot pinpoint an exact time, may have been present for the 2 weeks like her inguinal and gluteal cleft rashes.  She reports lesions over her gums, cheeks, and tongue.  She has no teeth left, but does use dentures.  Has not been able to use her dentures since these painful lesions have popped up given the discomfort.  She reports her dentures do fit quite well.  No changes in denture material or related products including cleaners or paste.  Still able to eat and drink normally.  Normal UOP.  Again, no fever or chills.  Health maintenance -Due for colon cancer screening - Needs shingles vaccine - Due for Medicare annual wellness exam  PERTINENT  PMH / PSH:  Patient Active Problem List   Diagnosis Date Noted   Need for shingles vaccine 01/10/2023   Stage 3a chronic kidney disease (CKD) ([place]) 05/18/2022   Black stools 05/18/2022   Hypomagnesemia 05/14/2022   Multifocal pneumonia 05/13/2022   COPD exacerbation ([place]) 05/13/2022   Hypoxia, exertional 05/13/2022   Macular hole of left eye 10/17/2020   Change in vision 07/31/2020   Muscle, jerky movements (uncontrolled) 02/11/2020   Migraine 09/15/2018   Healthcare maintenance 09/15/2018   Polypharmacy 06/10/2018   COPD suggested by initial evaluation ([place]) 01/06/2018   Cough 10/28/2017   Rash and nonspecific skin eruption 10/28/2017   Candidiasis of skin 04/02/2017   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[place])  03/10/2017   Osteoarthritis of spine with radiculopathy, cervical region 03/10/2017   Hypothyroidism 11/17/2016   Episode of recurrent major depressive disorder ([place])    Pure hypercholesterolemia 11/06/2016   Chronic venous insufficiency 11/05/2016   Peripheral neuropathy 11/05/2016   Late effects of CVA (cerebrovascular accident) 06/09/2016   Hx of adenomatous colonic polyps 06/04/2014   Oral thrush 05/30/2010   Anxiety state 10/07/2007   Essential hypertension 07/25/2007   GERD 07/25/2007    OBJECTIVE:   BP 136/66   Pulse 60   Wt 194 lb (88 kg)   SpO2 98%   BMI 33.30 kg/m    PHQ-9:     01/08/2023   10:01 AM 08/28/2022    1:28 PM 06/08/2022    8:27 AM  Depression screen PHQ 2/9  Decreased Interest 0 3 0  Down, Depressed, Hopeless 0 2 0  PHQ - 2 Score 0 5 0  Altered sleeping 0 1 0  Tired, decreased energy 0 1 0  Change in appetite 0 3 0  Feeling bad or failure about yourself  0 0 0  Trouble concentrating 0 0 0  Moving slowly or fidgety/restless 0 0 0  Suicidal thoughts 0 0 0  PHQ-9 Score 0 10 0  Difficult doing work/chores   Not difficult at all    Physical Exam General: Awake, alert, oriented, no acute distress HEENT: Absent dentition, gums pink, moist oral mucosa, pale whitish-pinkish macules and papules over inner cheeks, gums, and tongue with slightly erythematous bases Respiratory: Unlabored respirations, speaking  in full sentences, no respiratory distress Extremities: Moving all extremities spontaneously Skin: Flat hyperpigmented purpleish erythematous plaque extending over bilateral inguinal areas, no surrounding erythematous streaking or discharge  ASSESSMENT/PLAN:   Candidiasis of skin Visualized inguinal rash consistent with candidiasis.  Discussed keeping the area clean and dry.  Rx nystatin powder.  Return precautions given, see AVS for more.  Oral thrush Physical exam consistent with oral thrush.  Reviewed medication list, could be due to to her  Trelegy inhaler.  Discussed to rinse and spit, Rx Magic mouthwash with nystatin and lidocaine.  Return precautions given, see AVS for more.  No red flags or B symptoms.  Need for shingles vaccine Printed Rx for Shingrix vaccine, provided the patient.  [facility]  Healthcare maintenance Due for Medicare annual wellness visit.  Discussed with patient, front desk to call and schedule patient for appointment.     Ezequiel Essex, MD [place]

## 2023-01-08 NOTE — Patient Instructions (Addendum)
It was wonderful to see you today. Thank you for allowing me to be a part of your care. Below is a short summary of what we discussed at your visit today:  Rash, mouth complaint Using nystatin powder in the groin and butt crack, anywhere you are having the rash. Apply this 2 to 3 times a day after thoroughly drying the area. Keeping the area dry will make the rash go away quicker.  For the mouth, use the prescribed mouthwash 3 times daily.  Swish and spit.  Make sure to get the corners of your mouth as well.  Please call us or come back if these do not improve with the above prescriptions.  Colon cancer screening You look like you are due for colon cancer screening.  Please let us know if you would like [place] to refer you over to GI for this.  You can also call on your own schedule an appointment, your insurance should be amenable to this.  Shingles vaccine I have printed a script for you to take to your pharmacy and get the shingles vaccine. This is to help protect you from Shingles.   Medicare Annual Wellness Exam Your chart indicates that you are due for your Medicare annual wellness exam.  Your insurance likes [place] to do one of these every year.  This is a nurse only visit that takes about 30 minutes to an hour.  It can be done either in person or virtually.  This is an in-depth visit that focuses on preventative care and keeping you healthy.  Somebody from our clinic will be calling you soon to get this scheduled.   Please bring all of your medications to every appointment!  If you have any questions or concerns, please do not hesitate to contact us via phone or MyChart message.   Ezequiel Essex, MD

## 2023-01-10 DIAGNOSIS — Z23 Encounter for immunization: Secondary | ICD-10-CM | POA: Insufficient documentation

## 2023-01-10 NOTE — Assessment & Plan Note (Signed)
Physical exam consistent with oral thrush.  Reviewed medication list, could be due to to her Trelegy inhaler.  Discussed to rinse and spit, Rx Magic mouthwash with nystatin and lidocaine.  Return precautions given, see AVS for more.  No red flags or B symptoms.

## 2023-01-10 NOTE — Assessment & Plan Note (Signed)
Due for Medicare annual wellness visit.  Discussed with patient, front desk to call and schedule patient for appointment.

## 2023-01-10 NOTE — Assessment & Plan Note (Signed)
Visualized inguinal rash consistent with candidiasis.  Discussed keeping the area clean and dry.  Rx nystatin powder.  Return precautions given, see AVS for more.

## 2023-01-10 NOTE — Assessment & Plan Note (Signed)
Printed Rx for Shingrix vaccine, provided the patient.  [facility].

## 2023-01-15 ENCOUNTER — Other Ambulatory Visit: Payer: Self-pay | Admitting: Family Medicine

## 2023-01-15 DIAGNOSIS — R21 Rash and other nonspecific skin eruption: Secondary | ICD-10-CM

## 2023-02-01 ENCOUNTER — Other Ambulatory Visit: Payer: Self-pay | Admitting: Family Medicine

## 2023-02-01 ENCOUNTER — Other Ambulatory Visit: Payer: Self-pay | Admitting: Student

## 2023-02-01 DIAGNOSIS — G43009 Migraine without aura, not intractable, without status migrainosus: Secondary | ICD-10-CM

## 2023-02-01 DIAGNOSIS — B372 Candidiasis of skin and nail: Secondary | ICD-10-CM

## 2023-02-01 DIAGNOSIS — R21 Rash and other nonspecific skin eruption: Secondary | ICD-10-CM

## 2023-02-01 DIAGNOSIS — I10 Essential (primary) hypertension: Secondary | ICD-10-CM

## 2023-02-01 DIAGNOSIS — E039 Hypothyroidism, unspecified: Secondary | ICD-10-CM

## 2023-02-01 DIAGNOSIS — J449 Chronic obstructive pulmonary disease, unspecified: Secondary | ICD-10-CM

## 2023-02-15 ENCOUNTER — Telehealth: Payer: Self-pay | Admitting: Family Medicine

## 2023-02-15 NOTE — Telephone Encounter (Signed)
Called patient to schedule Medicare Annual Wellness Visit (AWV). Unable to reach patient. Wrong phone number.  Last date of AWV: 08/18/2017  Please schedule an appointment at any time with [place] .  If any questions, please contact me at [phone].   Thank you,  [place] Direct dial  [phone]

## 2023-02-21 ENCOUNTER — Other Ambulatory Visit: Payer: Self-pay | Admitting: Family Medicine

## 2023-02-21 DIAGNOSIS — F411 Generalized anxiety disorder: Secondary | ICD-10-CM

## 2023-02-21 DIAGNOSIS — F339 Major depressive disorder, recurrent, unspecified: Secondary | ICD-10-CM

## 2023-02-23 ENCOUNTER — Ambulatory Visit
Admission: RE | Admit: 2023-02-23 | Discharge: 2023-02-23 | Disposition: A | Discharge status: Home / Self Care | Payer: 59 | Source: Ambulatory Visit | Attending: Family Medicine | Admitting: Family Medicine

## 2023-02-23 ENCOUNTER — Ambulatory Visit (INDEPENDENT_AMBULATORY_CARE_PROVIDER_SITE_OTHER): Payer: 59 | Admitting: Student

## 2023-02-23 VITALS — BP 132/74 | HR 51 | Wt 194.0 lb

## 2023-02-23 DIAGNOSIS — R051 Acute cough: Secondary | ICD-10-CM | POA: Diagnosis not present

## 2023-02-23 DIAGNOSIS — F339 Major depressive disorder, recurrent, unspecified: Secondary | ICD-10-CM

## 2023-02-23 MED ORDER — DOXYCYCLINE HYCLATE 100 MG PO TABS: 100.0000 mg | ORAL_TABLET | Freq: Two times a day (BID) | ORAL | 0 refills | Status: AC

## 2023-02-23 MED ORDER — QUETIAPINE FUMARATE 50 MG PO TABS: ORAL_TABLET | ORAL | 0 refills | Status: DC

## 2023-02-23 MED ORDER — CEFDINIR 300 MG PO CAPS: 300.0000 mg | ORAL_CAPSULE | Freq: Two times a day (BID) | ORAL | 0 refills | Status: AC

## 2023-02-23 NOTE — Progress Notes (Signed)
    SUBJECTIVE:   CHIEF COMPLAINT / HPI:   Erica Morris is a 77 year-old with a history notable for HTN, GERD, COPD here with cough.  Her cough started 5 days ago.  She also had runny nose and congestion. No nausea, vomiting or diarrhea.  No fever.  She is having chills, says she feels like she "cannot warm up." Denies any phlegm or sputum production.  Has been using her albuterol inhaler although it does not provide leaf also makes her heart race makes her "feel nervous."  Last time she used it was this morning.  She says she feels as bad as she did was when she was hospitalized in June 2023 when she had CAP and hypoxia, thought to be secondary to COPD exacerbation with multifocal pneumonia.  She says she feels a cough "deep down in my chest."  Has been taking her Trelegy daily, but feels like it does not help.  Does not use tobacco.  PERTINENT  PMH / PSH: COPD, GERD, CHF, CKD  OBJECTIVE:   BP 132/74   Pulse (!) 51   Wt 194 lb (88 kg)   SpO2 98%   BMI 33.30 kg/m   General: Pleasant, well-appearing, ambulates independently elderly female CV: Regular rate and rhythm Respiratory: Normal work of breathing on room air.  Scant coarse breath sounds in right field appreciated.  She has good air movement in all lung fields. Speaks in full sentences Abdomen: Soft and non-tender Extremities: Warm and well-perfused.  Trace peripheral edema.   ASSESSMENT/PLAN:   Cough Generally well-appearing 77 year old female here for cough for 5 days. She is afebrile in clinic.  No sputum production. SpO2 98% on room air.  I did an ambulatory walk test with the patient around the clinic, and she had maintained her SpO2 at 96% appropriately on room air and no dyspnea on exertion.   Clinical picture is appropriate for outpatient management at this time.  Differential includes community-acquired pneumonia, GERD, COPD exacerbation, CHF, COVID, with CAP being most likely. -Given recent history of  hospitalization for multifocal pneumonia with COPD, will go ahead and treat for CAP with cefdinir 300 mg twice daily x 10 days and doxycycline 100 mg twice daily for 7 days. -Ordered chest x-ray -COVID test today -Return precautions discussed    Orvis Brill, [place]

## 2023-02-23 NOTE — Assessment & Plan Note (Addendum)
Generally well-appearing 77 year old female here for cough for 5 days. She is afebrile in clinic.  No sputum production. SpO2 98% on room air.  I did an ambulatory walk test with the patient around the clinic, and she had maintained her SpO2 at 96% appropriately on room air and no dyspnea on exertion.   Clinical picture is appropriate for outpatient management at this time.  Differential includes community-acquired pneumonia, GERD, COPD exacerbation, CHF, COVID, with CAP being most likely. -Given recent history of hospitalization for multifocal pneumonia with COPD, will go ahead and treat for CAP with cefdinir 300 mg twice daily x 10 days and doxycycline 100 mg twice daily for 7 days. -Ordered chest x-ray -COVID test today -Return precautions discussed

## 2023-02-23 NOTE — Patient Instructions (Addendum)
Great seeing you today, Erica Morris.  We will go ahead and treat you with antibiotics. -Take doxycycline twice a day for 7 days -Take cefdinir twice a day for 10 days.  Please go to [address] to get your chest x-ray completed today. I will follow-up on these results and give you a call.  I would like  you to do a telehealth visit in the next couple days with your PCP so we can see how you are doing.  Please schedule this at the front desk before leaving.  Best wishes, Dr. Owens Shark

## 2023-02-25 LAB — NOVEL CORONAVIRUS, NAA: SARS-CoV-2, NAA: NOT DETECTED

## 2023-03-02 ENCOUNTER — Other Ambulatory Visit: Payer: Self-pay | Admitting: Student

## 2023-03-09 ENCOUNTER — Other Ambulatory Visit: Payer: Self-pay | Admitting: *Deleted

## 2023-03-09 DIAGNOSIS — I10 Essential (primary) hypertension: Secondary | ICD-10-CM

## 2023-03-09 MED ORDER — LOSARTAN POTASSIUM 50 MG PO TABS: 50.0000 mg | ORAL_TABLET | Freq: Every day | ORAL | 1 refills | Status: DC

## 2023-03-18 ENCOUNTER — Other Ambulatory Visit: Payer: Self-pay | Admitting: Family Medicine

## 2023-03-18 DIAGNOSIS — F411 Generalized anxiety disorder: Secondary | ICD-10-CM

## 2023-03-18 DIAGNOSIS — R21 Rash and other nonspecific skin eruption: Secondary | ICD-10-CM

## 2023-03-19 ENCOUNTER — Other Ambulatory Visit: Payer: Self-pay

## 2023-03-19 DIAGNOSIS — I6990 Unspecified sequelae of unspecified cerebrovascular disease: Secondary | ICD-10-CM

## 2023-03-19 DIAGNOSIS — I50810 Right heart failure, unspecified: Secondary | ICD-10-CM

## 2023-03-19 DIAGNOSIS — K219 Gastro-esophageal reflux disease without esophagitis: Secondary | ICD-10-CM

## 2023-03-19 DIAGNOSIS — I5032 Chronic diastolic (congestive) heart failure: Secondary | ICD-10-CM

## 2023-03-19 NOTE — Telephone Encounter (Signed)
Please call pharmacy and cancel (discontinue) prozac prescription.  Terisa Starr, MD  Family Medicine Teaching Service

## 2023-03-19 NOTE — Telephone Encounter (Signed)
Called pharmacy. They report that Prozac prescription had already been discontinued.   Veronda Prude, RN

## 2023-03-22 MED ORDER — PANTOPRAZOLE SODIUM 40 MG PO TBEC: 40.0000 mg | DELAYED_RELEASE_TABLET | Freq: Every day | ORAL | 0 refills | Status: DC

## 2023-03-22 MED ORDER — ATORVASTATIN CALCIUM 80 MG PO TABS: 80.0000 mg | ORAL_TABLET | Freq: Every day | ORAL | 0 refills | Status: DC

## 2023-03-22 MED ORDER — FUROSEMIDE 20 MG PO TABS: 40.0000 mg | ORAL_TABLET | Freq: Two times a day (BID) | ORAL | 0 refills | Status: DC

## 2023-03-22 MED ORDER — METOPROLOL TARTRATE 25 MG PO TABS: 25.0000 mg | ORAL_TABLET | Freq: Two times a day (BID) | ORAL | 0 refills | Status: DC

## 2023-04-06 ENCOUNTER — Ambulatory Visit: Payer: 59

## 2023-04-06 ENCOUNTER — Telehealth: Payer: Self-pay | Admitting: Family Medicine

## 2023-04-06 NOTE — Telephone Encounter (Signed)
Patient no showed today's appointment.  Previous no-shows noted in the chart, detailed below: - Today 04/06/2023 - 10/01/2022 - 09/24/2022 - 09/18/2021  Will send to [facility] no-show pool for tracking.   Fayette Pho, MD

## 2023-04-08 ENCOUNTER — Ambulatory Visit: Payer: 59 | Admitting: Family Medicine

## 2023-04-29 ENCOUNTER — Telehealth: Payer: Self-pay

## 2023-04-29 NOTE — Telephone Encounter (Signed)
Patient calls nurse line with daughter in law, Efraim Kaufmann, regarding her anxiety.   She reports that son is hospitalized and is being discharged home with hospice.   She states that her Lexapro is not helping with her anxiety. Feels that her "nerves are tore up."   She and her daughter in law will be assisting in caring for her son.   Advised that appointment would likely be needed to make any changes to medications or starting any new medications.   Patient states that she does not have transportation to come into the office at this time.   Will forward to PCP for further advisement.   Veronda Prude, RN

## 2023-04-30 NOTE — Telephone Encounter (Signed)
Please notify patient she will need to  come in for an office visit to discuss treating her anxiety  It would not be safe to prescribe over the phone.  We need to review all her medications - she should bring them - and examine her  Thanks  LC

## 2023-04-30 NOTE — Telephone Encounter (Signed)
Tried to call patient's daughter Gershon Cull), I was unable to get a hold of anyone and could not leave VM. Will try again later. Penni Bombard CMA

## 2023-05-05 ENCOUNTER — Other Ambulatory Visit: Payer: Self-pay | Admitting: Family Medicine

## 2023-05-05 DIAGNOSIS — R21 Rash and other nonspecific skin eruption: Secondary | ICD-10-CM

## 2023-05-11 ENCOUNTER — Ambulatory Visit: Payer: 59 | Admitting: Family Medicine

## 2023-05-11 NOTE — Progress Notes (Deleted)
    SUBJECTIVE:   CHIEF COMPLAINT / HPI:   ***  PERTINENT  PMH / PSH: *** Patient Active Problem List   Diagnosis Date Noted   Need for shingles vaccine 01/10/2023   Stage 3a chronic kidney disease (CKD) (HCC) 05/18/2022   Black stools 05/18/2022   Hypomagnesemia 05/14/2022   Multifocal pneumonia 05/13/2022   COPD exacerbation (HCC) 05/13/2022   Hypoxia, exertional 05/13/2022   Macular hole of left eye 10/17/2020   Change in vision 07/31/2020   Muscle, jerky movements (uncontrolled) 02/11/2020   Migraine 09/15/2018   Healthcare maintenance 09/15/2018   Polypharmacy 06/10/2018   COPD suggested by initial evaluation (HCC) 01/06/2018   Cough 10/28/2017   Rash and nonspecific skin eruption 10/28/2017   Candidiasis of skin 04/02/2017   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HCC) 03/10/2017   Osteoarthritis of spine with radiculopathy, cervical region 03/10/2017   Hypothyroidism 11/17/2016   Episode of recurrent major depressive disorder (HCC)    Pure hypercholesterolemia 11/06/2016   Chronic venous insufficiency 11/05/2016   Peripheral neuropathy 11/05/2016   Late effects of CVA (cerebrovascular accident) 06/09/2016   Hx of adenomatous colonic polyps 06/04/2014   Oral thrush 05/30/2010   Anxiety state 10/07/2007   Essential hypertension 07/25/2007   GERD 07/25/2007    Current Outpatient Medications  Medication Instructions   acetaminophen (TYLENOL) 1,000 mg, Oral, Every 6 hours PRN   albuterol (VENTOLIN HFA) 108 (90 Base) MCG/ACT inhaler INHALE 2 PUFFS EVERY 6HRS AS NEEDED FOR WHEEZING/SHORTNESS OF BREATH   amLODipine (NORVASC) 10 mg, Oral, Daily   aspirin EC 81 mg, Oral, Every morning   atorvastatin (LIPITOR) 80 mg, Oral, Daily   benzonatate (TESSALON) 200 mg, Oral, 2 times daily PRN   carbamazepine (TEGRETOL) 100 mg, Oral, 2 times daily   escitalopram (LEXAPRO) 20 mg, Oral, Daily   fluticasone (FLONASE) 50 MCG/ACT nasal spray SPRAY 2 SPRAYS INTO EACH NOSTRIL EVERY DAY    Fluticasone-Umeclidin-Vilant (TRELEGY ELLIPTA) 100-62.5-25 MCG/ACT AEPB 1 puff, Inhalation, Daily   furosemide (LASIX) 40 mg, Oral, 2 times daily   guaiFENesin-dextromethorphan (ROBITUSSIN DM) 100-10 MG/5ML syrup 10 mLs, Oral, Every 4 hours PRN   halobetasol (ULTRAVATE) 0.05 % ointment APPLY TO AFFECTED AREA TWICE A DAY   KLOR-CON M20 20 MEQ tablet 20 mEq, Oral, 2 times daily   levothyroxine (SYNTHROID) 50 mcg, Oral, Daily before breakfast   losartan (COZAAR) 50 mg, Oral, Daily at bedtime   Magnesium Oxide 400 mg, Oral, Daily   metoprolol tartrate (LOPRESSOR) 25 mg, Oral, 2 times daily   nystatin (MYCOSTATIN/NYSTOP) powder 1 Application, Topical, 2 times daily, Apply to groin and butt - anywhere affected by rash.   pantoprazole (PROTONIX) 40 mg, Oral, Daily   psyllium (HYDROCIL/METAMUCIL) 95 % PACK 1 packet, Oral, Daily   QUEtiapine (SEROQUEL) 50 MG tablet TAKE 1 TABLET EVERY MORNING AND 2 AT NIGHT       02/23/2023    1:20 PM 01/08/2023   10:01 AM 10/15/2022    3:07 PM  Vitals with BMI  Weight 194 lbs 194 lbs 207 lbs 3 oz  Systolic 132 136 027  Diastolic 74 66 65  Pulse 51 60 59      OBJECTIVE:   There were no vitals taken for this visit.  ***  ASSESSMENT/PLAN:   There are no diagnoses linked to this encounter.   There are no Patient Instructions on file for this visit.   Carney Living, MD [facility]

## 2023-05-13 ENCOUNTER — Other Ambulatory Visit: Payer: Self-pay | Admitting: Family Medicine

## 2023-05-13 ENCOUNTER — Ambulatory Visit (INDEPENDENT_AMBULATORY_CARE_PROVIDER_SITE_OTHER): Payer: 59 | Admitting: Family Medicine

## 2023-05-13 ENCOUNTER — Encounter: Payer: Self-pay | Admitting: Family Medicine

## 2023-05-13 ENCOUNTER — Other Ambulatory Visit: Payer: Self-pay

## 2023-05-13 VITALS — BP 128/69 | HR 56 | Ht 63.0 in | Wt 190.2 lb

## 2023-05-13 DIAGNOSIS — J449 Chronic obstructive pulmonary disease, unspecified: Secondary | ICD-10-CM | POA: Diagnosis not present

## 2023-05-13 DIAGNOSIS — R300 Dysuria: Secondary | ICD-10-CM | POA: Diagnosis not present

## 2023-05-13 DIAGNOSIS — B370 Candidal stomatitis: Secondary | ICD-10-CM

## 2023-05-13 DIAGNOSIS — K1370 Unspecified lesions of oral mucosa: Secondary | ICD-10-CM | POA: Diagnosis not present

## 2023-05-13 DIAGNOSIS — J439 Emphysema, unspecified: Secondary | ICD-10-CM

## 2023-05-13 DIAGNOSIS — Z23 Encounter for immunization: Secondary | ICD-10-CM

## 2023-05-13 DIAGNOSIS — K5900 Constipation, unspecified: Secondary | ICD-10-CM

## 2023-05-13 DIAGNOSIS — Z00129 Encounter for routine child health examination without abnormal findings: Secondary | ICD-10-CM

## 2023-05-13 DIAGNOSIS — R634 Abnormal weight loss: Secondary | ICD-10-CM | POA: Diagnosis not present

## 2023-05-13 DIAGNOSIS — B372 Candidiasis of skin and nail: Secondary | ICD-10-CM

## 2023-05-13 DIAGNOSIS — I50810 Right heart failure, unspecified: Secondary | ICD-10-CM

## 2023-05-13 DIAGNOSIS — E876 Hypokalemia: Secondary | ICD-10-CM

## 2023-05-13 DIAGNOSIS — K130 Diseases of lips: Secondary | ICD-10-CM

## 2023-05-13 DIAGNOSIS — E7800 Pure hypercholesterolemia, unspecified: Secondary | ICD-10-CM

## 2023-05-13 LAB — POCT URINALYSIS DIP (MANUAL ENTRY)
Bilirubin, UA: NEGATIVE
Blood, UA: NEGATIVE
Glucose, UA: NEGATIVE mg/dL
Nitrite, UA: NEGATIVE
Spec Grav, UA: 1.02 (ref 1.010–1.025)
Urobilinogen, UA: 0.2 E.U./dL
pH, UA: 5.5 (ref 5.0–8.0)

## 2023-05-13 MED ORDER — MUPIROCIN 2 % EX OINT
1.0000 | TOPICAL_OINTMENT | Freq: Two times a day (BID) | CUTANEOUS | 0 refills | Status: DC
Start: 2023-05-13 — End: 2023-05-25

## 2023-05-13 MED ORDER — BISACODYL 10 MG RE SUPP
10.0000 mg | Freq: Every day | RECTAL | 0 refills | Status: AC
Start: 2023-05-13 — End: ?

## 2023-05-13 MED ORDER — SENNA 8.6 MG PO TABS
1.0000 | ORAL_TABLET | Freq: Two times a day (BID) | ORAL | 0 refills | Status: AC
Start: 2023-05-13 — End: ?

## 2023-05-13 MED ORDER — POLYETHYLENE GLYCOL 3350 17 GM/SCOOP PO POWD
17.0000 g | Freq: Two times a day (BID) | ORAL | 1 refills | Status: DC
Start: 2023-05-13 — End: 2023-05-13

## 2023-05-13 MED ORDER — SHINGRIX 50 MCG/0.5ML IM SUSR
0.5000 mL | INTRAMUSCULAR | 0 refills | Status: DC
Start: 2023-05-13 — End: 2023-05-25

## 2023-05-13 MED ORDER — POTASSIUM CHLORIDE CRYS ER 20 MEQ PO TBCR
20.0000 meq | EXTENDED_RELEASE_TABLET | Freq: Two times a day (BID) | ORAL | 3 refills | Status: DC
Start: 2023-05-13 — End: 2023-09-28

## 2023-05-13 MED ORDER — NYSTATIN 100000 UNIT/GM EX POWD: 1.0000 | Freq: Two times a day (BID) | CUTANEOUS | 3 refills | Status: DC

## 2023-05-13 MED ORDER — TRELEGY ELLIPTA 100-62.5-25 MCG/ACT IN AEPB: 1.0000 | INHALATION_SPRAY | Freq: Every day | RESPIRATORY_TRACT | 6 refills | Status: DC

## 2023-05-13 MED ORDER — DEXAMETHASONE 0.5 MG/5ML PO SOLN
5.0000 mL | Freq: Two times a day (BID) | ORAL | 0 refills | Status: DC
Start: 2023-05-13 — End: 2023-05-13

## 2023-05-13 MED ORDER — LACTULOSE ENEMA
1.0000 | Freq: Every day | RECTAL | 3 refills | Status: AC | PRN
Start: 2023-05-13 — End: ?

## 2023-05-13 NOTE — Progress Notes (Signed)
SUBJECTIVE:   CHIEF COMPLAINT / HPI:   Constipation, back pain Erica Morris is a pleasant 77 yo woman here with complaints of constipation, back pain, and mouth sores.   Constipation  One week duration Has tried stool softeners and an OTC enema without relief Does not normally suffer from constipation Has never had an episode of constipation like this Never before had colonoscopy No blood in urine or stool, or when wiping Some unintentional weight loss Epigastric pain - only for two days Wraps around back No aggravating or relieving    Low back pain 2 weeks duration Located in lumbar and sacral areas, "wraps around" "Nagging pain" every day Felt like she was having trouble walking - staggering intermittently No saddle anesthesia No urine incontinence Some chills - but thinks because of air conditioning in house  Mouth sores, angular celitis Previously evaluated for this 01/08/23 by myself, was diagnosed as oral thrush. At that time, was given magic mouthwash and instructed to rinse out her mouth after her Trelegy inhaler use.  Today, she reports the mouth sores are ongoing and did not respond to the magic mouthwash. Also notes the sides of her mouth are sore now , too.   PERTINENT  PMH / PSH:  Patient Active Problem List   Diagnosis Date Noted   Stage 3a chronic kidney disease (CKD) (HCC) 05/18/2022   Black stools 05/18/2022   COPD (chronic obstructive pulmonary disease) (HCC) 05/13/2022   Hypoxia, exertional 05/13/2022   Macular hole of left eye 10/17/2020   Change in vision 07/31/2020   Muscle, jerky movements (uncontrolled) 02/11/2020   Migraine 09/15/2018   Polypharmacy 06/10/2018   COPD suggested by initial evaluation (HCC) 01/06/2018   Rash and nonspecific skin eruption 10/28/2017   Candidiasis of skin 04/02/2017   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HCC) 03/10/2017   Osteoarthritis of spine with radiculopathy, cervical region 03/10/2017   Hypothyroidism  11/17/2016   Episode of recurrent major depressive disorder (HCC)    Pure hypercholesterolemia 11/06/2016   Chronic venous insufficiency 11/05/2016   Peripheral neuropathy 11/05/2016   Late effects of CVA (cerebrovascular accident) 06/09/2016   Hx of adenomatous colonic polyps 06/04/2014   Constipation 03/29/2014   Angular cheilitis 05/30/2010   Anxiety state 10/07/2007   Essential hypertension 07/25/2007   GERD 07/25/2007    OBJECTIVE:   BP 128/69   Pulse (!) 56   Ht 5\' 3"  (1.6 m)   Wt 190 lb 3.2 oz (86.3 kg)   SpO2 100%   BMI 33.69 kg/m    PHQ-9:     05/13/2023   10:52 AM 02/23/2023    1:20 PM 01/08/2023   10:01 AM  Depression screen PHQ 2/9  Decreased Interest 3 0 0  Down, Depressed, Hopeless 3 0 0  PHQ - 2 Score 6 0 0  Altered sleeping 3 0 0  Tired, decreased energy 3 0 0  Change in appetite 3 0 0  Feeling bad or failure about yourself  3 0 0  Trouble concentrating 3 0 0  Moving slowly or fidgety/restless 3 0 0  Suicidal thoughts 3 0 0  PHQ-9 Score 27 0 0    Physical Exam General: Awake, alert, oriented HEENT: moist oral mucosa, largely absent dentition, scattered skin-colored macules across roof of mouth and buccal surfaces without surrounding erythema, posterior oropharynx unremarkable and without erythema or tonsillar swelling, angular celitis present bilaterally without obvious fissure, discharge, or bleeding  Lymph: No palpable lymphedema of head or neck Cardiovascular: Regular rate  and rhythm, S1 and S2 present, no murmurs auscultated Respiratory: Lung fields clear to auscultation bilaterally Abdomen: Soft, nondistended, no TTP in any quadrant, no rebound tenderness or guarding  ASSESSMENT/PLAN:   Constipation One week of severe constipation resistant to her OTC remedy attempts. Physical exam re-assuring against surgical abdomen at this time. At this time, I am most concerned about colon or other intestinal cancer causing an outlet obstruction. Concerning  factors include no prior history of constipation this severe, never before had a colonoscopy, unintentional weight loss of 17 pounds since November, feeling of fever and chills, and new nagging low back pain that makes it difficult for her to walk.   Back pain was originally reported to nurse as dysuria and so UA was collected. On exam, patient denies dysuria and reported instead her back pain. UA with leuks, protein, and ketones. Will no treat for UTI given no symptoms.   Plan: - constipation relief with miralax, senna, ducolax suppository, and lactulose enema  - CBC with diff, blood smear, BMP - urgent referral to GI for colonoscopy and further work up  Angular cheilitis Angular cheilitis and oral thrush previously unresponsive to magic mouthwash.  - trial mupirocin for angular cheilitis - trial nystatin swish and spit for oral lesions - HIV, RPR, B12, B2, TSH, folate, zinc, and iron studies  COPD (chronic obstructive pulmonary disease) (HCC) Refilled trelegy inhaler as requested. Tolerating well without adverse side effects.   Candidiasis of skin Refilled nystatin powder as requested. Tolerating well w/o adverse s/e.   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HCC) Refilled potassium as requested. Tolerating well. Takes to keep normal potassium while on diuretics. Will obtain potassium level today with blood work.   Pure hypercholesterolemia Secondary prevention, hx CVA. Last lipid panel 3 years ago, none on file since increasing Lipitor to 80 mg. Will collect today with blood draw.      Fayette Pho, MD [facility]

## 2023-05-13 NOTE — Patient Instructions (Addendum)
It was wonderful to see you today. Thank you for allowing me to be a part of your care. Below is a short summary of what we discussed at your visit today:  Constipation Today, do the following:  Get a 32 ounce bottle of gatorade or Pedialyte (any kind will do).  Pour into a pitcher or large bottle.  Mix in 8 (EIGHT) scoops of Miralax. Dissolve fully.  Drink slowly over a couple of hours and stay close to a bathroom.   Use the following regimen until you begin to have normal bowel movements: START MiraLAX 17 g powder dissolved in water twice daily. START senna 1 tablet twice daily. START Dulcolax suppository daily at bedtime. You may use an enema such as salt water or lactulose enema daily as needed.  You must drink a lot of fluids with this regimen.  You must drink at least 6-8 bottles of water in a day.  I have referred you to GI for discussion of colonoscopy.  Someone from their office should be calling you in 1 to 2 weeks to schedule an appointment.  If you do not hear from them, let us know. We may need to nudge along the referral.    Please come back Monday or Tuesday of next week to make sure this regimen has worked.  Medicare Annual Wellness Exam Your chart indicates that you are due for your Medicare annual wellness exam.  Your insurance likes [place] to do one of these every year.  This is a nurse only visit that takes about 30 minutes to an hour.  It can be done either in person or virtually.  This is an in-depth visit that focuses on preventative care and keeping you healthy.  Somebody from our clinic will be calling you soon to get this scheduled.  Shingles Vaccine I have written a prescription for the shingles vaccine.  Please take this to your pharmacy to have this administered. Your insurance prefers you get this specific vaccine at the pharmacy instead of in clinic.   Please bring all of your medications to every appointment! If you have any questions or concerns, please do not  hesitate to contact us via phone or MyChart message.   Fayette Pho, MD

## 2023-05-14 ENCOUNTER — Encounter: Payer: Self-pay | Admitting: Family Medicine

## 2023-05-14 ENCOUNTER — Telehealth: Payer: Self-pay | Admitting: Family Medicine

## 2023-05-14 DIAGNOSIS — E611 Iron deficiency: Secondary | ICD-10-CM

## 2023-05-14 DIAGNOSIS — D649 Anemia, unspecified: Secondary | ICD-10-CM

## 2023-05-14 DIAGNOSIS — E538 Deficiency of other specified B group vitamins: Secondary | ICD-10-CM

## 2023-05-14 LAB — BASIC METABOLIC PANEL: BUN: 12 mg/dL (ref 8–27)

## 2023-05-14 LAB — IRON,TIBC AND FERRITIN PANEL: Ferritin: 126 ng/mL (ref 15–150)

## 2023-05-14 LAB — PATHOLOGIST SMEAR REVIEW
Basos: 0 %
EOS (ABSOLUTE): 0 10*3/uL (ref 0.0–0.4)
Hematocrit: 29.6 % — ABNORMAL LOW (ref 34.0–46.6)
Immature Granulocytes: 0 %
Lymphocytes Absolute: 1.4 10*3/uL (ref 0.7–3.1)
MCH: 27.5 pg (ref 26.6–33.0)

## 2023-05-14 LAB — TSH: TSH: 1.74 u[IU]/mL (ref 0.450–4.500)

## 2023-05-14 LAB — CBC WITH DIFFERENTIAL
Hemoglobin: 9.7 g/dL — ABNORMAL LOW (ref 11.1–15.9)
Lymphocytes Absolute: 1.4 10*3/uL (ref 0.7–3.1)

## 2023-05-14 LAB — FOLATE: Folate: 2.7 ng/mL — ABNORMAL LOW (ref >3.0)

## 2023-05-14 LAB — LIPID PANEL: LDL Chol Calc (NIH): 104 mg/dL — ABNORMAL HIGH (ref 0–99)

## 2023-05-14 MED ORDER — FERROUS GLUCONATE 324 (38 FE) MG PO TABS
324.0000 mg | ORAL_TABLET | Freq: Every day | ORAL | 3 refills | Status: AC
Start: 2023-05-14 — End: ?

## 2023-05-14 MED ORDER — FOLIC ACID 1 MG PO TABS
1.0000 mg | ORAL_TABLET | Freq: Every day | ORAL | 3 refills | Status: AC
Start: 2023-05-14 — End: ?

## 2023-05-14 NOTE — Assessment & Plan Note (Signed)
Refilled trelegy inhaler as requested. Tolerating well without adverse side effects.

## 2023-05-14 NOTE — Assessment & Plan Note (Signed)
Angular cheilitis and oral thrush previously unresponsive to magic mouthwash.  - trial mupirocin for angular cheilitis - trial nystatin swish and spit for oral lesions - HIV, RPR, B12, B2, TSH, folate, zinc, and iron studies

## 2023-05-14 NOTE — Assessment & Plan Note (Addendum)
One week of severe constipation resistant to her OTC remedy attempts. Physical exam re-assuring against surgical abdomen at this time. At this time, I am most concerned about colon or other intestinal cancer causing an outlet obstruction. Concerning factors include no prior history of constipation this severe, never before had a colonoscopy, unintentional weight loss of 17 pounds since November, feeling of fever and chills, and new nagging low back pain that makes it difficult for her to walk.   Back pain was originally reported to nurse as dysuria and so UA was collected. On exam, patient denies dysuria and reported instead her back pain. UA with leuks, protein, and ketones. Will no treat for UTI given no symptoms.   Plan: - constipation relief with miralax, senna, ducolax suppository, and lactulose enema  - CBC with diff, blood smear, BMP - urgent referral to GI for colonoscopy and further work up

## 2023-05-14 NOTE — Assessment & Plan Note (Signed)
Secondary prevention, hx CVA. Last lipid panel 3 years ago, none on file since increasing Lipitor to 80 mg. Will collect today with blood draw.

## 2023-05-14 NOTE — Telephone Encounter (Signed)
Called patient to discuss labs. No answer, left VM.   Constipation, B symptoms, concern for intraabdominal malignancy: - CBC with worsened IDA slightly below previous levels - awaiting smear  Lipids:  - elevated, although would neglect TRIG as this was non-fasting - secondary prevention, goal <70  Angular cheilitis: - low folate and iron - normal TSH and B12 - negative RPR and HIV - awaiting zinc, B2  Plan:  - start iron PO with ferrous gluconate 325 mg daily - start folate daily - see GI asap - can consider zetia with PCP  Fayette Pho, MD

## 2023-05-14 NOTE — Addendum Note (Signed)
Addended by: Valetta Close on: 05/14/2023 06:21 PM   Modules accepted: Orders

## 2023-05-14 NOTE — Assessment & Plan Note (Signed)
Refilled potassium as requested. Tolerating well. Takes to keep normal potassium while on diuretics. Will obtain potassium level today with blood work.

## 2023-05-14 NOTE — Assessment & Plan Note (Signed)
Refilled nystatin powder as requested. Tolerating well w/o adverse s/e.

## 2023-05-15 LAB — CBC WITH DIFFERENTIAL
Immature Granulocytes: 0 %
Lymphs: 18 %
MCH: 26.8 pg (ref 26.6–33.0)
MCHC: 32.6 g/dL (ref 31.5–35.7)
MCV: 82 fL (ref 79–97)
RBC: 3.62 x10E6/uL — ABNORMAL LOW (ref 3.77–5.28)

## 2023-05-15 LAB — IRON,TIBC AND FERRITIN PANEL
Iron Saturation: 16 % (ref 15–55)
Iron: 32 ug/dL (ref 27–139)
Total Iron Binding Capacity: 194 ug/dL — ABNORMAL LOW (ref 250–450)

## 2023-05-15 LAB — BASIC METABOLIC PANEL
CO2: 24 mmol/L (ref 20–29)
Calcium: 8 mg/dL — ABNORMAL LOW (ref 8.7–10.3)
Chloride: 101 mmol/L (ref 96–106)
eGFR: 50 mL/min/{1.73_m2} — ABNORMAL LOW (ref >59)

## 2023-05-15 LAB — LIPID PANEL
Chol/HDL Ratio: 5.1 ratio — ABNORMAL HIGH (ref 0.0–4.4)
VLDL Cholesterol Cal: 28 mg/dL (ref 5–40)

## 2023-05-15 LAB — VITAMIN B2, WHOLE BLOOD

## 2023-05-17 ENCOUNTER — Other Ambulatory Visit: Payer: Self-pay | Admitting: Family Medicine

## 2023-05-18 ENCOUNTER — Ambulatory Visit: Payer: 59 | Admitting: Family Medicine

## 2023-05-18 LAB — PATHOLOGIST SMEAR REVIEW
Basophils Absolute: 0 10*3/uL (ref 0.0–0.2)
Eos: 1 %
Hemoglobin: 9.8 g/dL — ABNORMAL LOW (ref 11.1–15.9)
Immature Grans (Abs): 0 10*3/uL (ref 0.0–0.1)
Lymphs: 18 %
MCHC: 33.1 g/dL (ref 31.5–35.7)
MCV: 83 fL (ref 79–97)
Monocytes Absolute: 0.4 10*3/uL (ref 0.1–0.9)
Monocytes: 5 %
Neutrophils Absolute: 5.8 10*3/uL (ref 1.4–7.0)
Neutrophils: 76 %
Platelets: 275 10*3/uL (ref 150–450)
RBC: 3.57 x10E6/uL — ABNORMAL LOW (ref 3.77–5.28)
RDW: 13 % (ref 11.7–15.4)
WBC: 7.7 10*3/uL (ref 3.4–10.8)

## 2023-05-18 NOTE — Progress Notes (Deleted)
SUBJECTIVE:   CHIEF COMPLAINT / HPI:   From Dr Larita Fife visit 6/13 Constipation One week of severe constipation resistant to her OTC remedy attempts. Physical exam re-assuring against surgical abdomen at this time. At this time, I am most concerned about colon or other intestinal cancer causing an outlet obstruction. Concerning factors include no prior history of constipation this severe, never before had a colonoscopy, unintentional weight loss of 17 pounds since November, feeling of fever and chills, and new nagging low back pain that makes it difficult for her to walk.    Back pain was originally reported to nurse as dysuria and so UA was collected. On exam, patient denies dysuria and reported instead her back pain. UA with leuks, protein, and ketones. Will no treat for UTI given no symptoms.    Plan: - constipation relief with miralax, senna, ducolax suppository, and lactulose enema  - CBC with diff, blood smear, BMP - urgent referral to GI for colonoscopy and further work up   Angular cheilitis Angular cheilitis and oral thrush previously unresponsive to magic mouthwash.  - trial mupirocin for angular cheilitis - trial nystatin swish and spit for oral lesions - HIV, RPR, B12, B2, TSH, folate, zinc, and iron studies   COPD (chronic obstructive pulmonary disease) (HCC) Refilled trelegy inhaler as requested. Tolerating well without adverse side effects.    Candidiasis of skin Refilled nystatin powder as requested. Tolerating well w/o adverse s/e.    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HCC) Refilled potassium as requested. Tolerating well. Takes to keep normal potassium while on diuretics. Will obtain potassium level today with blood work.    Pure hypercholesterolemia Secondary prevention, hx CVA. Last lipid panel 3 years ago, none on file since increasing Lipitor to 80 mg. Will collect today with blood draw.     PERTINENT  PMH / PSH: *** Patient Active Problem List   Diagnosis Date  Noted   Stage 3a chronic kidney disease (CKD) (HCC) 05/18/2022   Black stools 05/18/2022   COPD (chronic obstructive pulmonary disease) (HCC) 05/13/2022   Hypoxia, exertional 05/13/2022   Macular hole of left eye 10/17/2020   Change in vision 07/31/2020   Muscle, jerky movements (uncontrolled) 02/11/2020   Migraine 09/15/2018   Polypharmacy 06/10/2018   COPD suggested by initial evaluation (HCC) 01/06/2018   Rash and nonspecific skin eruption 10/28/2017   Candidiasis of skin 04/02/2017   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HCC) 03/10/2017   Osteoarthritis of spine with radiculopathy, cervical region 03/10/2017   Hypothyroidism 11/17/2016   Episode of recurrent major depressive disorder (HCC)    Pure hypercholesterolemia 11/06/2016   Chronic venous insufficiency 11/05/2016   Peripheral neuropathy 11/05/2016   Late effects of CVA (cerebrovascular accident) 06/09/2016   Hx of adenomatous colonic polyps 06/04/2014   Constipation 03/29/2014   Angular cheilitis 05/30/2010   Anxiety state 10/07/2007   Essential hypertension 07/25/2007   GERD 07/25/2007    Current Outpatient Medications  Medication Instructions   acetaminophen (TYLENOL) 1,000 mg, Oral, Every 6 hours PRN   albuterol (VENTOLIN HFA) 108 (90 Base) MCG/ACT inhaler INHALE 2 PUFFS EVERY 6HRS AS NEEDED FOR WHEEZING/SHORTNESS OF BREATH   amLODipine (NORVASC) 10 mg, Oral, Daily   aspirin EC 81 mg, Oral, Every morning   atorvastatin (LIPITOR) 80 mg, Oral, Daily   benzonatate (TESSALON) 200 mg, Oral, 2 times daily PRN   bisacodyl (DULCOLAX) 10 mg, Rectal, Daily at bedtime, Until having normal bowel movements.   carbamazepine (TEGRETOL) 100 mg, Oral, 2 times  daily   escitalopram (LEXAPRO) 20 mg, Oral, Daily   ferrous gluconate (FERGON) 324 mg, Oral, Daily with breakfast, If makes you constipated, reduce to every other day.   fluticasone (FLONASE) 50 MCG/ACT nasal spray SPRAY 2 SPRAYS INTO EACH NOSTRIL EVERY DAY    Fluticasone-Umeclidin-Vilant (TRELEGY ELLIPTA) 100-62.5-25 MCG/ACT AEPB 1 puff, Inhalation, Daily   folic acid (FOLVITE) 1 mg, Oral, Daily   furosemide (LASIX) 40 mg, Oral, 2 times daily   guaiFENesin-dextromethorphan (ROBITUSSIN DM) 100-10 MG/5ML syrup 10 mLs, Oral, Every 4 hours PRN   halobetasol (ULTRAVATE) 0.05 % ointment APPLY TO AFFECTED AREA TWICE A DAY   lactulose (CHRONULAC) 10 GM/15ML SOLN enema 300 mLs, Rectal, Daily PRN, Until having normal bowel movements.   levothyroxine (SYNTHROID) 50 mcg, Oral, Daily before breakfast   losartan (COZAAR) 50 mg, Oral, Daily at bedtime   Magnesium Oxide 400 mg, Oral, Daily   metoprolol tartrate (LOPRESSOR) 25 mg, Oral, 2 times daily   MIRALAX 17 GM/SCOOP powder TAKE 17 G BY MOUTH 2 (TWO) TIMES DAILY. UNTIL HAVING NORMAL BOWEL MOVEMENTS.   mupirocin ointment (BACTROBAN) 2 % 1 Application, Topical, 2 times daily, Apply to the corners of the mouth.   nystatin (MYCOSTATIN) 100000 UNIT/ML suspension TAKE 5 MLS BY MOUTH 2 (TWO) TIMES DAILY. SWISH AND SPIT.   nystatin (MYCOSTATIN/NYSTOP) powder 1 Application, Topical, 2 times daily, Apply to groin and butt - anywhere affected by rash.   pantoprazole (PROTONIX) 40 mg, Oral, Daily   potassium chloride SA (KLOR-CON M20) 20 MEQ tablet 20 mEq, Oral, 2 times daily   psyllium (HYDROCIL/METAMUCIL) 95 % PACK 1 packet, Oral, Daily   QUEtiapine (SEROQUEL) 50 MG tablet TAKE 1 TABLET EVERY MORNING AND 2 AT NIGHT   senna (SENOKOT) 8.6 mg, Oral, 2 times daily   Zoster Vaccine Adjuvanted [facility]) injection 0.5 mLs, Intramuscular, Every 30 days       05/13/2023   11:12 AM 05/13/2023   10:51 AM 02/23/2023    1:20 PM  Vitals with BMI  Height  5\' 3"    Weight  190 lbs 3 oz 194 lbs  BMI  33.7   Systolic 128 134 161  Diastolic 69 58 74  Pulse 56 59 51      OBJECTIVE:   There were no vitals taken for this visit.  ***  ASSESSMENT/PLAN:   There are no diagnoses linked to this encounter.   There are no  Patient Instructions on file for this visit.   Carney Living, MD [facility]

## 2023-05-19 ENCOUNTER — Telehealth: Payer: Self-pay | Admitting: Family Medicine

## 2023-05-19 DIAGNOSIS — I10 Essential (primary) hypertension: Secondary | ICD-10-CM

## 2023-05-19 NOTE — Telephone Encounter (Signed)
Called patient to discuss labs.  She reports continued constipation with little relief.  Daughter joins the phone call and provides some details.  Daughter reports Ms. Terpening is not taking the miralax and senna as prescribed.  There was a prescription at the pharmacy to help with constipation but it cost $25 so they did not get it. They cannot recall the name. Wonder if enema?   Constipation, B symptoms, concern for intraabdominal malignancy: CBC with worsened anemia slightly below previous levels smear non-concerning, simply shows normocytic anemia urgently referred to GI 6/14 referral note indicates they called 6/17 and left VM  I sent her a letter with the above information on 6/17 Today daughter reports she got the VM but has not called them back because she was unsure why her mom needed the referral in the first place Daughter is confident the unintentional weight loss is because "she isn't eating right"    Angular cheilitis: low folate and iron normal TSH and B12 negative RPR and HIV zinc normal awaiting vit. B2   Plan:  No PO iron while severely constipated Offered iron infusion, but daughter says Ms. Ring has no way to get there, she won't ride in a car with a stranger because of her anxiety (I.e. insurance-paid transportation) Start folate 1 g daily (script previously sent 6/14) See GI asap Daughter reports she will give her mom the phone number to call back Can consider zetia with PCP  Social notes:  Daughter on the phone reports Ms. Hefner "doesn't eat right" and "doesn't do what the doctors tell her to". For example, one day Ms. Kingdon ate a hot dog for breakfast and ice cream for dinner. Daughter reports patient simply doesn't want to get up and cook for herself. There is a 76 yo girl at home, Ms. Millhouse's granddaughter. Daughter tells me she lets the 76 yo and Ms. Nemitz "fend for themselves" because she is working. Daughter says that while her mom lives with her, she  cannot be her caregiver because she "has her own things and [her] 58 yo daughter to care for". Daughter asks what else can be done, if someone can come into the home to care for her and prepare her food. At this point, Ms. Glymph is heard in the background verbally upset, saying multiple times "You're trying to put me in a nursing home!"  I recommended Ensure drinks BID-TID and addressing possible home health aide or personal care aide at her upcoming appointment on 6/25.   Fayette Pho, MD

## 2023-05-20 ENCOUNTER — Encounter: Payer: Self-pay | Admitting: Gastroenterology

## 2023-05-20 LAB — CBC WITH DIFFERENTIAL
Basophils Absolute: 0 10*3/uL (ref 0.0–0.2)
Basos: 0 %
EOS (ABSOLUTE): 0.1 10*3/uL (ref 0.0–0.4)
Eos: 1 %
Hematocrit: 29.8 % — ABNORMAL LOW (ref 34.0–46.6)
Immature Grans (Abs): 0 10*3/uL (ref 0.0–0.1)
Monocytes Absolute: 0.4 10*3/uL (ref 0.1–0.9)
Monocytes: 5 %
Neutrophils Absolute: 5.8 10*3/uL (ref 1.4–7.0)
Neutrophils: 76 %
RDW: 13 % (ref 11.7–15.4)
WBC: 7.6 10*3/uL (ref 3.4–10.8)

## 2023-05-20 LAB — BASIC METABOLIC PANEL
BUN/Creatinine Ratio: 11 — ABNORMAL LOW (ref 12–28)
Creatinine, Ser: 1.14 mg/dL — ABNORMAL HIGH (ref 0.57–1.00)
Glucose: 90 mg/dL (ref 70–99)
Potassium: 3.2 mmol/L — ABNORMAL LOW (ref 3.5–5.2)
Sodium: 140 mmol/L (ref 134–144)

## 2023-05-20 LAB — ZINC: Zinc: 55 ug/dL (ref 44–115)

## 2023-05-20 LAB — RPR: RPR Ser Ql: NONREACTIVE

## 2023-05-20 LAB — VITAMIN B12: Vitamin B-12: 270 pg/mL (ref 232–1245)

## 2023-05-20 LAB — LIPID PANEL
Cholesterol, Total: 164 mg/dL (ref 100–199)
HDL: 32 mg/dL — ABNORMAL LOW (ref >39)
Triglycerides: 156 mg/dL — ABNORMAL HIGH (ref 0–149)

## 2023-05-20 LAB — IRON,TIBC AND FERRITIN PANEL: UIBC: 162 ug/dL (ref 118–369)

## 2023-05-20 LAB — HIV ANTIBODY (ROUTINE TESTING W REFLEX): HIV Screen 4th Generation wRfx: NONREACTIVE

## 2023-05-24 ENCOUNTER — Ambulatory Visit (INDEPENDENT_AMBULATORY_CARE_PROVIDER_SITE_OTHER): Payer: 59

## 2023-05-24 VITALS — Ht 63.0 in | Wt 190.0 lb

## 2023-05-24 DIAGNOSIS — Z0000 Encounter for general adult medical examination without abnormal findings: Secondary | ICD-10-CM

## 2023-05-24 NOTE — Patient Instructions (Signed)
Erica Morris , Thank you for taking time to come for your Medicare Wellness Visit. I appreciate your ongoing commitment to your health goals. Please review the following plan we discussed and let me know if I can assist you in the future.   These are the goals we discussed:  Goals      Blood Pressure < 150/90     Increase physical activity        This is a list of the screening recommended for you and due dates:  Health Maintenance  Topic Date Due   DEXA scan (bone density measurement)  Never done   Colon Cancer Screening  04/06/2019   Zoster (Shingles) Vaccine (2 of 2) 12/15/2019   COVID-19 Vaccine (4 - 2023-24 season) 07/31/2022   Flu Shot  07/01/2023   Medicare Annual Wellness Visit  05/23/2024   DTaP/Tdap/Td vaccine (3 - Td or Tdap) 11/05/2027   Pneumonia Vaccine  Completed   Hepatitis C Screening  Completed   HPV Vaccine  Aged Out    Advanced directives: Information on Advanced Care Planning can be found at [facility] [place] Advance Health Care Directives Advance Health Care Directives ([email or web address]/) Aim for 30 minutes of exercise or brisk walking, 6-8 glasses of water, and 5 servings of fruits and vegetables each day.  Conditions/risks identified: Aim for 30 minutes of exercise or brisk walking, 6-8 glasses of water, and 5 servings of fruits and vegetables each day.  Next appointment: Follow up in one year for your annual wellness visit    Preventive Care 65 Years and Older, Female Preventive care refers to lifestyle choices and visits with your health care provider that can promote health and wellness. What does preventive care include? A yearly physical exam. This is also called an annual well check. Dental exams once or twice a year. Routine eye exams. Ask your health care provider how often you should have your eyes checked. Personal lifestyle choices, including: Daily care of your teeth and gums. Regular physical activity. Eating a healthy  diet. Avoiding tobacco and drug use. Limiting alcohol use. Practicing safe sex. Taking low-dose aspirin every day. Taking vitamin and mineral supplements as recommended by your health care provider. What happens during an annual well check? The services and screenings done by your health care provider during your annual well check will depend on your age, overall health, lifestyle risk factors, and family history of disease. Counseling  Your health care provider may ask you questions about your: Alcohol use. Tobacco use. Drug use. Emotional well-being. Home and relationship well-being. Sexual activity. Eating habits. History of falls. Memory and ability to understand (cognition). Work and work Astronomer. Reproductive health. Screening  You may have the following tests or measurements: Height, weight, and BMI. Blood pressure. Lipid and cholesterol levels. These may be checked every 5 years, or more frequently if you are over 60 years old. Skin check. Lung cancer screening. You may have this screening every year starting at age 48 if you have a 30-pack-year history of smoking and currently smoke or have quit within the past 15 years. Fecal occult blood test (FOBT) of the stool. You may have this test every year starting at age 4. Flexible sigmoidoscopy or colonoscopy. You may have a sigmoidoscopy every 5 years or a colonoscopy every 10 years starting at age 56. Hepatitis C blood test. Hepatitis B blood test. Sexually transmitted disease (STD) testing. Diabetes screening. This is done by checking your blood sugar (glucose) after you have not  eaten for a while (fasting). You may have this done every 1-3 years. Bone density scan. This is done to screen for osteoporosis. You may have this done starting at age 24. Mammogram. This may be done every 1-2 years. Talk to your health care provider about how often you should have regular mammograms. Talk with your health care provider about  your test results, treatment options, and if necessary, the need for more tests. Vaccines  Your health care provider may recommend certain vaccines, such as: Influenza vaccine. This is recommended every year. Tetanus, diphtheria, and acellular pertussis (Tdap, Td) vaccine. You may need a Td booster every 10 years. Zoster vaccine. You may need this after age 69. Pneumococcal 13-valent conjugate (PCV13) vaccine. One dose is recommended after age 46. Pneumococcal polysaccharide (PPSV23) vaccine. One dose is recommended after age 5. Talk to your health care provider about which screenings and vaccines you need and how often you need them. This information is not intended to replace advice given to you by your health care provider. Make sure you discuss any questions you have with your health care provider. Document Released: 12/13/2015 Document Revised: 08/05/2016 Document Reviewed: 09/17/2015 Elsevier Interactive Patient Education  2017 [employer].  Fall Prevention in the Home Falls can cause injuries. They can happen to people of all ages. There are many things you can do to make your home safe and to help prevent falls. What can I do on the outside of my home? Regularly fix the edges of walkways and driveways and fix any cracks. Remove anything that might make you trip as you walk through a door, such as a raised step or threshold. Trim any bushes or trees on the path to your home. Use bright outdoor lighting. Clear any walking paths of anything that might make someone trip, such as rocks or tools. Regularly check to see if handrails are loose or broken. Make sure that both sides of any steps have handrails. Any raised decks and porches should have guardrails on the edges. Have any leaves, snow, or ice cleared regularly. Use sand or salt on walking paths during winter. Clean up any spills in your garage right away. This includes oil or grease spills. What can I do in the bathroom? Use  night lights. Install grab bars by the toilet and in the tub and shower. Do not use towel bars as grab bars. Use non-skid mats or decals in the tub or shower. If you need to sit down in the shower, use a plastic, non-slip stool. Keep the floor dry. Clean up any water that spills on the floor as soon as it happens. Remove soap buildup in the tub or shower regularly. Attach bath mats securely with double-sided non-slip rug tape. Do not have throw rugs and other things on the floor that can make you trip. What can I do in the bedroom? Use night lights. Make sure that you have a light by your bed that is easy to reach. Do not use any sheets or blankets that are too big for your bed. They should not hang down onto the floor. Have a firm chair that has side arms. You can use this for support while you get dressed. Do not have throw rugs and other things on the floor that can make you trip. What can I do in the kitchen? Clean up any spills right away. Avoid walking on wet floors. Keep items that you use a lot in easy-to-reach places. If you need to reach  something above you, use a strong step stool that has a grab bar. Keep electrical cords out of the way. Do not use floor polish or wax that makes floors slippery. If you must use wax, use non-skid floor wax. Do not have throw rugs and other things on the floor that can make you trip. What can I do with my stairs? Do not leave any items on the stairs. Make sure that there are handrails on both sides of the stairs and use them. Fix handrails that are broken or loose. Make sure that handrails are as long as the stairways. Check any carpeting to make sure that it is firmly attached to the stairs. Fix any carpet that is loose or worn. Avoid having throw rugs at the top or bottom of the stairs. If you do have throw rugs, attach them to the floor with carpet tape. Make sure that you have a light switch at the top of the stairs and the bottom of the  stairs. If you do not have them, ask someone to add them for you. What else can I do to help prevent falls? Wear shoes that: Do not have high heels. Have rubber bottoms. Are comfortable and fit you well. Are closed at the toe. Do not wear sandals. If you use a stepladder: Make sure that it is fully opened. Do not climb a closed stepladder. Make sure that both sides of the stepladder are locked into place. Ask someone to hold it for you, if possible. Clearly mark and make sure that you can see: Any grab bars or handrails. First and last steps. Where the edge of each step is. Use tools that help you move around (mobility aids) if they are needed. These include: Canes. Walkers. Scooters. Crutches. Turn on the lights when you go into a dark area. Replace any light bulbs as soon as they burn out. Set up your furniture so you have a clear path. Avoid moving your furniture around. If any of your floors are uneven, fix them. If there are any pets around you, be aware of where they are. Review your medicines with your doctor. Some medicines can make you feel dizzy. This can increase your chance of falling. Ask your doctor what other things that you can do to help prevent falls. This information is not intended to replace advice given to you by your health care provider. Make sure you discuss any questions you have with your health care provider. Document Released: 09/12/2009 Document Revised: 04/23/2016 Document Reviewed: 12/21/2014 Elsevier Interactive Patient Education  2017 [employer].

## 2023-05-24 NOTE — Progress Notes (Addendum)
Subjective:   Erica Morris is a 77 y.o. female who presents for Medicare Annual (Subsequent) preventive examination.  Visit Complete: Virtual  I connected with  Erica Morris on 05/24/23 by a audio enabled telemedicine application and verified that I am speaking with the correct person using two identifiers.  Patient Location: Home  Provider Location: Home Office  I discussed the limitations of evaluation and management by telemedicine. The patient expressed understanding and agreed to proceed.  Review of Systems     Cardiac Risk Factors include: advanced age (>66men, >2 women);dyslipidemia;hypertension;sedentary lifestyle     Objective:    Today's Vitals   05/24/23 0830  Weight: 190 lb (86.2 kg)  Height: 5\' 3"  (1.6 m)   Body mass index is 33.66 kg/m.     05/24/2023    9:07 AM 02/23/2023    1:20 PM 01/08/2023   10:01 AM 10/15/2022    3:07 PM 08/28/2022    1:29 PM 06/08/2022    8:27 AM 05/13/2022    1:49 AM  Advanced Directives  Does Patient Have a Medical Advance Directive? No No No No No No No  Would patient like information on creating a medical advance directive? Yes (MAU/Ambulatory/Procedural Areas - Information given) No - Patient declined No - Patient declined  No - Patient declined      Current Medications (verified) Outpatient Encounter Medications as of 05/24/2023  Medication Sig   acetaminophen (TYLENOL) 500 MG tablet Take 1,000 mg by mouth every 6 (six) hours as needed for moderate pain.   albuterol (VENTOLIN HFA) 108 (90 Base) MCG/ACT inhaler INHALE 2 PUFFS EVERY 6HRS AS NEEDED FOR WHEEZING/SHORTNESS OF BREATH   amLODipine (NORVASC) 10 MG tablet TAKE 1 TABLET BY MOUTH EVERY DAY   aspirin EC 81 MG tablet Take 81 mg by mouth every morning.    atorvastatin (LIPITOR) 80 MG tablet Take 1 tablet (80 mg total) by mouth daily.   benzonatate (TESSALON) 200 MG capsule Take 1 capsule (200 mg total) by mouth 2 (two) times daily as needed for cough.   bisacodyl  (DULCOLAX) 10 MG suppository Place 1 suppository (10 mg total) rectally at bedtime. Until having normal bowel movements.   carbamazepine (TEGRETOL) 200 MG tablet TAKE 1/2 TABLET TWICE DAILY   escitalopram (LEXAPRO) 20 MG tablet TAKE 1 TABLET BY MOUTH EVERY DAY   ferrous gluconate (FERGON) 324 MG tablet Take 1 tablet (324 mg total) by mouth daily with breakfast. If makes you constipated, reduce to every other day.   fluticasone (FLONASE) 50 MCG/ACT nasal spray SPRAY 2 SPRAYS INTO EACH NOSTRIL EVERY DAY   Fluticasone-Umeclidin-Vilant (TRELEGY ELLIPTA) 100-62.5-25 MCG/ACT AEPB Inhale 1 puff into the lungs daily.   folic acid (FOLVITE) 1 MG tablet Take 1 tablet (1 mg total) by mouth daily.   furosemide (LASIX) 20 MG tablet Take 2 tablets (40 mg total) by mouth 2 (two) times daily.   guaiFENesin-dextromethorphan (ROBITUSSIN DM) 100-10 MG/5ML syrup Take 10 mLs by mouth every 4 (four) hours as needed for cough.   halobetasol (ULTRAVATE) 0.05 % ointment APPLY TO AFFECTED AREA TWICE A DAY   lactulose (CHRONULAC) 10 GM/15ML SOLN enema Place 300 mLs rectally daily as needed for severe constipation. Until having normal bowel movements.   levothyroxine (SYNTHROID) 50 MCG tablet TAKE 1 TABLET BY MOUTH DAILY BEFORE BREAKFAST   losartan (COZAAR) 50 MG tablet Take 1 tablet (50 mg total) by mouth at bedtime.   Magnesium Oxide 400 MG CAPS Take 1 capsule (400 mg total) by  mouth daily.   metoprolol tartrate (LOPRESSOR) 25 MG tablet Take 1 tablet (25 mg total) by mouth 2 (two) times daily.   MIRALAX 17 GM/SCOOP powder TAKE 17 G BY MOUTH 2 (TWO) TIMES DAILY. UNTIL HAVING NORMAL BOWEL MOVEMENTS.   mupirocin ointment (BACTROBAN) 2 % Apply 1 Application topically 2 (two) times daily. Apply to the corners of the mouth.   nystatin (MYCOSTATIN) 100000 UNIT/ML suspension TAKE 5 MLS BY MOUTH 2 (TWO) TIMES DAILY. SWISH AND SPIT.   nystatin (MYCOSTATIN/NYSTOP) powder Apply 1 Application topically 2 (two) times daily. Apply to  groin and butt - anywhere affected by rash.   pantoprazole (PROTONIX) 40 MG tablet Take 1 tablet (40 mg total) by mouth daily.   potassium chloride SA (KLOR-CON M20) 20 MEQ tablet Take 1 tablet (20 mEq total) by mouth 2 (two) times daily.   psyllium (HYDROCIL/METAMUCIL) 95 % PACK Take 1 packet by mouth daily.   QUEtiapine (SEROQUEL) 50 MG tablet TAKE 1 TABLET EVERY MORNING AND 2 AT NIGHT   senna (SENOKOT) 8.6 MG TABS tablet Take 1 tablet (8.6 mg total) by mouth 2 (two) times daily.   Zoster Vaccine Adjuvanted [facility]) injection Inject 0.5 mLs into the muscle every 30 (thirty) days for 2 doses. (Patient not taking: Reported on 05/24/2023)   No facility-administered encounter medications on file as of 05/24/2023.    Allergies (verified) Lisinopril, Codeine phosphate, and Morphine and codeine   History: Past Medical History:  Diagnosis Date   ABDOMINAL PAIN, RECURRENT 03/29/2008   ANXIETY 10/07/2007   Candidiasis of mouth 05/30/2010   GERD 07/25/2007   Greater trochanteric bursitis 04/04/2020   R hip   Hernia, hiatal    History of colonic polyps 06/04/2014   History of CVA (cerebrovascular accident)    History of vitrectomy 09/05/2020   Macular hole surgical repair 2014 OD, improved acuity from 20/400 to now 20/50   HYPERTENSION 07/25/2007   Hypomagnesemia 05/14/2022   Multifocal pneumonia 05/13/2022   NSTEMI (non-ST elevated myocardial infarction) (HCC) 10/2017   Posterior vitreous detachment of left eye 09/05/2020   Stroke (HCC)    Thrush of mouth and esophagus (HCC) 05/30/2010   Qualifier: Diagnosis of  By: Abner Greenspan MD, Stacey     Vertigo 08/10/2017   Unclear if vertigo or other dizziness.   CT angio neck 05/18/16 neg MRI of brain 02/2016 neg    Past Surgical History:  Procedure Laterality Date   ABDOMINAL HYSTERECTOMY     BREAST SURGERY     bx   KNEE ARTHROSCOPY     LEFT HEART CATH AND CORONARY ANGIOGRAPHY N/A 11/02/2017   Procedure: LEFT HEART CATH AND CORONARY ANGIOGRAPHY;   Surgeon: Kathleene Hazel, MD;  Location: [facility];  Service: Cardiovascular;  Laterality: N/A;   Family History  Problem Relation Age of Onset   Dementia Mother    Alcoholism Mother    COPD Father    Diabetes Sister    Hypertension Sister    COPD Brother    Lung cancer Brother    Clotting disorder Brother    Kidney disease Neg Hx    Stroke Neg Hx    Social History   Socioeconomic History   Marital status: Widowed    Spouse name: Not on file   Number of children: Not on file   Years of education: Not on file   Highest education level: Not on file  Occupational History   Not on file  Tobacco Use   Smoking status: Never  Passive exposure: Past   Smokeless tobacco: Never  Vaping Use   Vaping Use: Never used  Substance and Sexual Activity   Alcohol use: Yes    Comment: occasional   Drug use: No   Sexual activity: Never    Birth control/protection: None  Other Topics Concern   Not on file  Social History Narrative   Current Social History 08/18/2017            Patient lives with daughter and 37 yo granddaughter in one level home 08/18/2017   Transportation: Patient relies on [employer] 08/18/2017   Important Relationships: "Nobody" 08/18/2017    Pets: Dog (Pebbles) cat Loree Fee) and two hamsters 08/18/2017   Education / Work:  8th grade/ Works around house 08/18/2017   Interests / Fun: TV and crosswords 08/18/2017   Current Stressors: Living situation and food concerns 08/18/2017   L. Leward Quan, RN, BSN                                                                                                 Social Determinants of Health   Financial Resource Strain: Low Risk  (05/24/2023)   Overall Financial Resource Strain (CARDIA)    Difficulty of Paying Living Expenses: Not hard at all  Food Insecurity: Food Insecurity Present (05/24/2023)   Hunger Vital Sign    Worried About Running Out of Food in the Last Year: Sometimes true    Ran Out of Food in the Last Year:  Sometimes true  Transportation Needs: No Transportation Needs (05/24/2023)   PRAPARE - Administrator, Civil Service (Medical): No    Lack of Transportation (Non-Medical): No  Physical Activity: Inactive (05/24/2023)   Exercise Vital Sign    Days of Exercise per Week: 0 days    Minutes of Exercise per Session: 0 min  Stress: No Stress Concern Present (05/24/2023)   [employer] of Occupational Health - Occupational Stress Questionnaire    Feeling of Stress : Only a little  Social Connections: Socially Isolated (05/24/2023)   Social Connection and Isolation Panel [NHANES]    Frequency of Communication with Friends and Family: Never    Frequency of Social Gatherings with Friends and Family: Three times a week    Attends Religious Services: Never    Active Member of Clubs or Organizations: No    Attends Banker Meetings: Never    Marital Status: Widowed    Tobacco Counseling Counseling given: Not Answered   Clinical Intake:  Pre-visit preparation completed: Yes  Pain : No/denies pain     Diabetes: No  How often do you need to have someone help you when you read instructions, pamphlets, or other written materials from your doctor or pharmacy?: 1 - Never  Interpreter Needed?: No  Information entered by :: Kandis Fantasia LPN   Activities of Daily Living    05/24/2023    9:07 AM  In your present state of health, do you have any difficulty performing the following activities:  Hearing? 0  Vision? 0  Difficulty concentrating or making decisions? 0  Walking or climbing stairs? 0  Dressing or bathing? 0  Doing errands, shopping? 1  Preparing Food and eating ? N  Using the Toilet? N  In the past six months, have you accidently leaked urine? N  Do you have problems with loss of bowel control? N  Managing your Medications? N  Managing your Finances? N  Housekeeping or managing your Housekeeping? Y    Patient Care Team: Carney Living,  MD as PCP - General (Family Medicine) Ernesto Rutherford, MD as Consulting Physician (Ophthalmology)  Indicate any recent Medical Services you may have received from other than [facility] providers in the past year (date may be approximate).     Assessment:   This is a routine wellness examination for Erica Morris.  Hearing/Vision screen Hearing Screening - Comments:: Hard of hearing   Vision Screening - Comments:: up to date with routine eye exams with [facility]   Dietary issues and exercise activities discussed:     Goals Addressed             This Visit's Progress    Increase physical activity        Depression Screen    05/24/2023    9:04 AM 05/13/2023   10:52 AM 02/23/2023    1:20 PM 01/08/2023   10:01 AM 08/28/2022    1:28 PM 06/08/2022    8:27 AM 05/18/2022    8:53 AM  PHQ 2/9 Scores  PHQ - 2 Score 6 6 0 0 5 0 2  PHQ- 9 Score 27 27 0 0 10 0 10    Fall Risk    05/24/2023    9:06 AM 05/13/2023   10:52 AM 02/23/2023    1:20 PM 01/08/2023   10:01 AM 08/28/2022    1:28 PM  Fall Risk   Falls in the past year? 1 0 0 0 0  Number falls in past yr: 0 0 0 0 0  Injury with Fall? 0 0 0 0 0  Risk for fall due to : History of fall(s);Impaired balance/gait;Impaired mobility  No Fall Risks No Fall Risks   Follow up Education provided;Falls prevention discussed;Falls evaluation completed  Falls prevention discussed Falls prevention discussed     MEDICARE RISK AT HOME:  Medicare Risk at Home - 05/24/23 0907     Any stairs in or around the home? No    If so, are there any without handrails? No    Home free of loose throw rugs in walkways, pet beds, electrical cords, etc? Yes    Adequate lighting in your home to reduce risk of falls? Yes    Life alert? No    Use of a cane, walker or w/c? No    Grab bars in the bathroom? Yes    Shower chair or bench in shower? No    Elevated toilet seat or a handicapped toilet? Yes             TIMED UP AND GO:  Was the test performed?  No     Cognitive Function:        05/24/2023    9:07 AM  6CIT Screen  What Year? 0 points  What month? 0 points  What time? 0 points  Count back from 20 0 points  Months in reverse 2 points  Repeat phrase 2 points  Total Score 4 points    Immunizations Immunization History  Administered Date(s) Administered   Fluad Quad(high Dose 65+) 09/16/2021, 08/28/2022   Influenza Whole 10/07/2007   Influenza, High Dose Seasonal  PF 08/07/2016   Influenza, Quadrivalent, Recombinant, Inj, Pf 10/14/2019   Influenza,inj,Quad PF,6+ Mos 07/29/2017, 10/14/2020   Influenza-Unspecified 08/30/2014, 07/29/2018   PFIZER Comirnaty(Gray Top)Covid-19 Tri-Sucrose Vaccine 09/16/2021   PFIZER(Purple Top)SARS-COV-2 Vaccination 02/29/2020, 03/25/2020   Pneumococcal Conjugate-13 11/05/2016   Pneumococcal Polysaccharide-23 08/30/1998, 10/07/2007, 08/02/2018   Td 04/30/2001   Tdap 11/04/2017   Zoster Recombinat (Shingrix) 10/20/2019    TDAP status: Up to date  Pneumococcal vaccine status: Up to date  Covid-19 vaccine status: Information provided on how to obtain vaccines.   Qualifies for Shingles Vaccine? Yes   Zostavax completed No   Shingrix Completed?: No.    Education has been provided regarding the importance of this vaccine. Patient has been advised to call insurance company to determine out of pocket expense if they have not yet received this vaccine. Advised may also receive vaccine at local pharmacy or Health Dept. Verbalized acceptance and understanding.  Screening Tests Health Maintenance  Topic Date Due   DEXA SCAN  Never done   Colonoscopy  04/06/2019   Zoster Vaccines- Shingrix (2 of 2) 12/15/2019   COVID-19 Vaccine (4 - 2023-24 season) 07/31/2022   INFLUENZA VACCINE  07/01/2023   Medicare Annual Wellness (AWV)  05/23/2024   DTaP/Tdap/Td (3 - Td or Tdap) 11/05/2027   Pneumonia Vaccine 9+ Years old  Completed   Hepatitis C Screening  Completed   HPV VACCINES  Aged Out    Health  Maintenance  Health Maintenance Due  Topic Date Due   DEXA SCAN  Never done   Colonoscopy  04/06/2019   Zoster Vaccines- Shingrix (2 of 2) 12/15/2019   COVID-19 Vaccine (4 - 2023-24 season) 07/31/2022    Colorectal cancer screening: Referral to GI placed and pt has upcoming appointment. Pt aware the office will call re: appt.  Mammogram status: No longer required due to age and preference.  Bone Density status:  Patient declines at this time   Lung Cancer Screening: (Low Dose CT Chest recommended if Age 2-80 years, 20 pack-year currently smoking OR have quit w/in 15years.) does not qualify.   Lung Cancer Screening Referral: n/a  Additional Screening:  Hepatitis C Screening: does qualify; Completed 11/05/16  Vision Screening: Recommended annual ophthalmology exams for early detection of glaucoma and other disorders of the eye. Is the patient up to date with their annual eye exam?  Yes  Who is the provider or what is the name of the office in which the patient attends annual eye exams? [facility] If pt is not established with a provider, would they like to be referred to a provider to establish care? No .   Dental Screening: Recommended annual dental exams for proper oral hygiene  Community Resource Referral / Chronic Care Management: CRR required this visit?  No   CCM required this visit?  No     Plan:     I have personally reviewed and noted the following in the patient's chart:   Medical and social history Use of alcohol, tobacco or illicit drugs  Current medications and supplements including opioid prescriptions. Patient is not currently taking opioid prescriptions. Functional ability and status Nutritional status Physical activity Advanced directives List of other physicians Hospitalizations, surgeries, and ER visits in previous 12 months Vitals Screenings to include cognitive, depression, and falls Referrals and appointments  In addition, I have reviewed and  discussed with patient certain preventive protocols, quality metrics, and best practice recommendations. A written personalized care plan for preventive services as well as general preventive health recommendations  were provided to patient.     Kandis Fantasia [place], [place]   1/61/0960   After Visit Summary: (Mail) Due to this being a telephonic visit, the after visit summary with patients personalized plan was offered to patient via mail   Nurse Notes: Patient has appointment tomorrow and will discuss abdominal problems that she has been experiencing.      I have reviewed this visit and agree with the documentation.  Marshall L Chambliss

## 2023-05-25 ENCOUNTER — Ambulatory Visit (INDEPENDENT_AMBULATORY_CARE_PROVIDER_SITE_OTHER): Payer: 59 | Admitting: Family Medicine

## 2023-05-25 ENCOUNTER — Encounter: Payer: Self-pay | Admitting: Family Medicine

## 2023-05-25 ENCOUNTER — Other Ambulatory Visit: Payer: Self-pay

## 2023-05-25 VITALS — BP 144/61 | HR 75 | Ht 63.0 in | Wt 188.8 lb

## 2023-05-25 DIAGNOSIS — F411 Generalized anxiety disorder: Secondary | ICD-10-CM

## 2023-05-25 DIAGNOSIS — K5900 Constipation, unspecified: Secondary | ICD-10-CM

## 2023-05-25 DIAGNOSIS — D649 Anemia, unspecified: Secondary | ICD-10-CM

## 2023-05-25 DIAGNOSIS — J439 Emphysema, unspecified: Secondary | ICD-10-CM

## 2023-05-25 NOTE — Progress Notes (Unsigned)
SUBJECTIVE:   CHIEF COMPLAINT / HPI:   Here for follow up of abdominal issues Accompanied by her daughter and granddaughter.  Everyone has a bit of a different perspective on problems and approaches.  They did bring most of her medication bottles except for her recently prescribed constipation medications   GI complaints/Back Pain Saw Dr Larita Fife 6/13 with complaints of no bowel movement for aweek.   She was treated for constipation and referred to GI since this was a new complaint.  Follow up phone call indicated she was not taking her GI medications regularly and was still having constipation Today Ms Hedden relates she is having bowel movements but are yellow and watery. Unclear if she is taking GI medications or when.  No bleeding or vomiting.  Mild diffuse abdomen pain without fever or dysuria  Anxiety Nerves Daughter feels Ms Haden is very anxious.  Granddtr feels this is due to dtrs interaction with her.  Ms Calamia feels she is anxious sometime but sleeps well when take seroquel.  Used to see a psychiatrist but has not recently Did bring in her medication bottles today and is taking most of her medications    Cough Saw Dr Melissa Noon in March was treated for CAP since had history of multifocal pneumonia. Cxr then was normal.  Patient finished antibiotics and still has mild cough but not bothersome.  Uses Trelegy most days  OBJECTIVE:   BP (!) 144/61   Pulse 75   Ht 5\' 3"  (1.6 m)   Wt 188 lb 12.8 oz (85.6 kg)   SpO2 100%   BMI 33.44 kg/m   Alert seems to know most of her medications and what they are for. Able to get up and down from exam table without problems Has small infrequent jerking of her oral muscles Heart - Regular rate and rhythm.  No murmurs, gallops or rubs.    Lungs:  Normal respiratory effort, chest expands symmetrically. Lungs are distant but clear to auscultation, no crackles or wheezes. Abdomen - soft very mildly tender diffusely without organomegaly or masses  or distension Extrem - no edema   ASSESSMENT/PLAN:   Constipation, unspecified constipation type Assessment & Plan: This seems perhaps better although hard to get accurate assesment of bowel movements.  Does not appear significantly constipated on exam.  Urged to continue fiber and miralax daily.   Agree she definitely needs GI evaluation but not emergently.  Patient has appointment in August    Pulmonary emphysema, unspecified emphysema type [facility]) Assessment & Plan: Seems to be stable.  Encouraged to take her inhalers regularly    Anxiety state Assessment & Plan: Family reports this is a definite problem.  Mrs Grima feels, at least during the office visit, that she is ok.  Formerly saw a psychiatrist but stopped for unclear reasons.  Continue her current seroquel, tegretol, celexa.  Suggested she try to reestablish with psychiatry.  Also to consider a Geriatric consult after her GI workup    Anemia, unspecified type Assessment & Plan: With history of new constipation and former history of black stools definitely needs GI evaluation.  Is asymptomatic at todays visit.  Her ferritin is > 100 so unliklely to be purely iron deficiency.  Regardless she is holding her iron due to constipation.    Would recheck next visit.  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 [place] [address] [place], [place] [address] [phone] . For the bowels Take metamucil one scoop and Miralax one scoop each day  Consider seeing a Geriatric or psychiatrist   Please always bring your medication bottles  Make an appointment to See Dr Linwood Dibbles in one month    Carney Living, MD [facility]

## 2023-05-25 NOTE — Assessment & Plan Note (Signed)
This seems perhaps better although hard to get accurate assesment of bowel movements.  Does not appear significantly constipated on exam.  Urged to continue fiber and miralax daily.   Agree she definitely needs GI evaluation but not emergently.  Patient has appointment in August

## 2023-05-25 NOTE — Patient Instructions (Signed)
Good to see you today - Thank you for coming in  Things we discussed today:  Call and see if you can get on a waiting list [facility] [address] [place], [place] [address] [phone] . For the bowels Take metamucil one scoop and Miralax one scoop each day  Consider seeing a Geriatric or psychiatrist   Please always bring your medication bottles  Make an appointment to See Dr Linwood Dibbles in one month

## 2023-05-27 NOTE — Assessment & Plan Note (Signed)
With history of new constipation and former history of black stools definitely needs GI evaluation.  Is asymptomatic at todays visit.  Her ferritin is > 100 so unliklely to be purely iron deficiency.  Regardless she is holding her iron due to constipation.    Would recheck next visit.  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

## 2023-05-27 NOTE — Assessment & Plan Note (Signed)
Family reports this is a definite problem.  Erica Morris feels, at least during the office visit, that she is ok.  Formerly saw a psychiatrist but stopped for unclear reasons.  Continue her current seroquel, tegretol, celexa.  Suggested she try to reestablish with psychiatry.  Also to consider a Geriatric consult after her GI workup

## 2023-05-27 NOTE — Assessment & Plan Note (Signed)
Seems to be stable.  Encouraged to take her inhalers regularly

## 2023-06-06 ENCOUNTER — Other Ambulatory Visit: Payer: Self-pay | Admitting: Family Medicine

## 2023-06-06 DIAGNOSIS — R21 Rash and other nonspecific skin eruption: Secondary | ICD-10-CM

## 2023-06-15 ENCOUNTER — Ambulatory Visit (INDEPENDENT_AMBULATORY_CARE_PROVIDER_SITE_OTHER): Payer: 59 | Admitting: Student

## 2023-06-15 ENCOUNTER — Encounter: Payer: Self-pay | Admitting: Student

## 2023-06-15 VITALS — BP 127/62 | HR 56 | Temp 98.1°F | Ht 63.0 in | Wt 189.1 lb

## 2023-06-15 DIAGNOSIS — J449 Chronic obstructive pulmonary disease, unspecified: Secondary | ICD-10-CM

## 2023-06-15 DIAGNOSIS — J069 Acute upper respiratory infection, unspecified: Secondary | ICD-10-CM | POA: Diagnosis not present

## 2023-06-15 MED ORDER — ALBUTEROL SULFATE HFA 108 (90 BASE) MCG/ACT IN AERS
1.0000 | INHALATION_SPRAY | Freq: Four times a day (QID) | RESPIRATORY_TRACT | 6 refills | Status: DC | PRN
Start: 2023-06-15 — End: 2023-09-28

## 2023-06-15 MED ORDER — BENZONATATE 100 MG PO CAPS
100.0000 mg | ORAL_CAPSULE | Freq: Two times a day (BID) | ORAL | 0 refills | Status: DC | PRN
Start: 2023-06-15 — End: 2023-12-17

## 2023-06-15 MED ORDER — TRELEGY ELLIPTA 100-62.5-25 MCG/ACT IN AEPB: 1.0000 | INHALATION_SPRAY | Freq: Every day | RESPIRATORY_TRACT | 6 refills | Status: DC

## 2023-06-15 NOTE — Patient Instructions (Addendum)
It was wonderful to see you today. Thank you for allowing me to be a part of your care. Below is a short summary of what we discussed at your visit today:  Suspect your symptoms are more consistent with upper respiratory viral illness.  I have sent in prescription for [facility] for your cough.  You can also do warm water, honey and lemon to help with the cough.  For pain or fever you can do Tylenol.  Also I refilled your inhalers for her COPD.  Please make sure you stay well-hydrated.  Please bring all of your medications to every appointment!  If you have any questions or concerns, please do not hesitate to contact us via phone or MyChart message.   Jerre Simon, MD [facility]

## 2023-06-15 NOTE — Progress Notes (Signed)
    SUBJECTIVE:   CHIEF COMPLAINT / HPI:   Patient is a 77 year old female presenting today for sore throat and cough. Per patient symptom started with 3 days ago.  Reports no fever but other associated symptoms include sore throat, cough chills, and body ache  She has had decreased appetite but drinking adequately. Known recent sick contact from daughter who has had sore throat. No ear pain, vomiting or diarrhea.   PERTINENT  PMH / PSH: Reviewed  OBJECTIVE:   BP 127/62   Pulse (!) 56   Temp 98.1 F (36.7 C) (Oral)   Ht 5\' 3"  (1.6 m)   Wt 189 lb 2 oz (85.8 kg)   SpO2 99%   BMI 33.50 kg/m    Physical Exam General: Alert, well appearing, NAD HEENT: Normal tympanic membrane, oropharyngeal erythema, no tonsillar exudate. Cardiovascular: RRR, No Murmurs, Normal S2/S2 Respiratory: CTAB, No wheezing or Rales Abdomen: No distension or tenderness Extremities: No edema on extremities     ASSESSMENT/PLAN:   Viral illness 77 y.o. year old presents with cough, myalgia and sore throat. She is afebrile today and on exam has oropharyngeal erythema,  good work of breathing on RA and clear breath sounds bilaterally. Overall presentation and exam is consistent with viral URI.   - Discussed conservative management with warm water, honey and lemon. -Rx Tessalon Perles - Recommended tylenol for fever/ pain.  - Encouraged adequate hydration for patient.  -If no improvement in 5-7 days, low threshold to obtain checks x-ray. - Outline signs and symptoms that will warrant ED visit or return for further assessment.    Jerre Simon, MD [facility]

## 2023-06-20 ENCOUNTER — Other Ambulatory Visit: Payer: Self-pay | Admitting: Family Medicine

## 2023-06-20 DIAGNOSIS — I5032 Chronic diastolic (congestive) heart failure: Secondary | ICD-10-CM

## 2023-06-29 ENCOUNTER — Ambulatory Visit: Payer: 59 | Admitting: Family Medicine

## 2023-07-01 ENCOUNTER — Other Ambulatory Visit: Payer: Self-pay | Admitting: Family Medicine

## 2023-07-01 ENCOUNTER — Other Ambulatory Visit: Payer: Self-pay | Admitting: Student

## 2023-07-01 ENCOUNTER — Other Ambulatory Visit: Payer: Self-pay

## 2023-07-01 DIAGNOSIS — K219 Gastro-esophageal reflux disease without esophagitis: Secondary | ICD-10-CM

## 2023-07-01 DIAGNOSIS — F339 Major depressive disorder, recurrent, unspecified: Secondary | ICD-10-CM

## 2023-07-01 DIAGNOSIS — I50810 Right heart failure, unspecified: Secondary | ICD-10-CM

## 2023-07-01 DIAGNOSIS — I5032 Chronic diastolic (congestive) heart failure: Secondary | ICD-10-CM

## 2023-07-01 DIAGNOSIS — I6990 Unspecified sequelae of unspecified cerebrovascular disease: Secondary | ICD-10-CM

## 2023-07-01 DIAGNOSIS — I10 Essential (primary) hypertension: Secondary | ICD-10-CM

## 2023-07-03 MED ORDER — MAGNESIUM OXIDE 400 MG PO CAPS
400.0000 mg | ORAL_CAPSULE | Freq: Every day | ORAL | 3 refills | Status: DC
Start: 2023-07-03 — End: 2023-09-28

## 2023-07-05 ENCOUNTER — Other Ambulatory Visit: Payer: Self-pay

## 2023-07-05 DIAGNOSIS — F411 Generalized anxiety disorder: Secondary | ICD-10-CM

## 2023-07-05 MED ORDER — ESCITALOPRAM OXALATE 20 MG PO TABS
20.0000 mg | ORAL_TABLET | Freq: Every day | ORAL | 0 refills | Status: DC
Start: 2023-07-05 — End: 2023-12-17

## 2023-07-11 ENCOUNTER — Other Ambulatory Visit: Payer: Self-pay | Admitting: Family Medicine

## 2023-07-11 DIAGNOSIS — K219 Gastro-esophageal reflux disease without esophagitis: Secondary | ICD-10-CM

## 2023-07-16 ENCOUNTER — Encounter: Payer: Self-pay | Admitting: Gastroenterology

## 2023-07-16 ENCOUNTER — Ambulatory Visit (INDEPENDENT_AMBULATORY_CARE_PROVIDER_SITE_OTHER): Payer: 59 | Admitting: Gastroenterology

## 2023-07-16 VITALS — BP 136/80 | HR 68 | Ht 63.0 in | Wt 192.0 lb

## 2023-07-16 DIAGNOSIS — R1310 Dysphagia, unspecified: Secondary | ICD-10-CM

## 2023-07-16 DIAGNOSIS — Z86010 Personal history of colonic polyps: Secondary | ICD-10-CM | POA: Diagnosis not present

## 2023-07-16 DIAGNOSIS — R194 Change in bowel habit: Secondary | ICD-10-CM | POA: Diagnosis not present

## 2023-07-16 MED ORDER — NA SULFATE-K SULFATE-MG SULF 17.5-3.13-1.6 GM/177ML PO SOLN: 1.0000 | Freq: Once | ORAL | 0 refills | Status: AC

## 2023-07-16 NOTE — Progress Notes (Signed)
Chief Complaint: Change in bowel habits Primary GI MD: (Dr. Arlyce Dice)  HPI: 77 year old female with medical history of CHF, COPD, CVA (on aspirin), presents for evaluation of change in bowel habits.  Patient states over the last month she has been experiencing constipation.  She states she will go 1 to 2 days without a bowel movement sometimes.  Often has to strain.  She has been taking MiraLAX 1 capful daily which helps her produce a bowel movement in the evening.  She feels sometimes it does not work as effectively as it should since it takes all day for her to have to have a bowel movement.  Patient also reports lower abdominal pain that began with her constipation.  Sometimes make it better with a bowel movement.  Denies melena/hematochezia.  Patient also is having dysphagia to solids and liquids.  States she can get choked on a piece of popcorn or even water.  Previous history of strictures with EGD with dilation in 2015.  Denies associated GERD.   PREVIOUS GI WORKUP   Colonoscopy 03/2014 for screening with Dr. Arlyce Dice - Sessile polyp 2 mm in cecum - Sessile polyp 3 mm in sigmoid - Moderate diverticulosis - Internal hemorrhoids EGD for GERD and dysphagia  Due for repeat 2022  EGD 03/2014 for GERD and dysphagia - Esophageal stricture s/p Maloney dilation -Biopsies  Past Medical History:  Diagnosis Date   ABDOMINAL PAIN, RECURRENT 03/29/2008   ANXIETY 10/07/2007   Candidiasis of mouth 05/30/2010   GERD 07/25/2007   Greater trochanteric bursitis 04/04/2020   R hip   Hernia, hiatal    History of colonic polyps 06/04/2014   History of CVA (cerebrovascular accident)    History of vitrectomy 09/05/2020   Macular hole surgical repair 2014 OD, improved acuity from 20/400 to now 20/50   HYPERTENSION 07/25/2007   Hypomagnesemia 05/14/2022   Multifocal pneumonia 05/13/2022   NSTEMI (non-ST elevated myocardial infarction) (HCC) 10/2017   Posterior vitreous detachment of left eye  09/05/2020   Stroke (HCC)    Thrush of mouth and esophagus (HCC) 05/30/2010   Qualifier: Diagnosis of  By: Abner Greenspan MD, Stacey     Vertigo 08/10/2017   Unclear if vertigo or other dizziness.   CT angio neck 05/18/16 neg MRI of brain 02/2016 neg     Past Surgical History:  Procedure Laterality Date   ABDOMINAL HYSTERECTOMY     BREAST SURGERY     bx   KNEE ARTHROSCOPY     LEFT HEART CATH AND CORONARY ANGIOGRAPHY N/A 11/02/2017   Procedure: LEFT HEART CATH AND CORONARY ANGIOGRAPHY;  Surgeon: Kathleene Hazel, MD;  Location: [facility];  Service: Cardiovascular;  Laterality: N/A;    Current Outpatient Medications  Medication Sig Dispense Refill   acetaminophen (TYLENOL) 500 MG tablet Take 1,000 mg by mouth every 6 (six) hours as needed for moderate pain.     albuterol (VENTOLIN HFA) 108 (90 Base) MCG/ACT inhaler Inhale 1-2 puffs into the lungs every 6 (six) hours as needed for wheezing or shortness of breath. 8.5 each 6   amLODipine (NORVASC) 10 MG tablet TAKE 1 TABLET BY MOUTH EVERY DAY 90 tablet 3   aspirin EC 81 MG tablet Take 81 mg by mouth every morning.      atorvastatin (LIPITOR) 80 MG tablet TAKE 1 TABLET BY MOUTH EVERY DAY 90 tablet 3   benzonatate (TESSALON) 100 MG capsule Take 1 capsule (100 mg total) by mouth 2 (two) times daily as needed for cough.  20 capsule 0   bisacodyl (DULCOLAX) 10 MG suppository Place 1 suppository (10 mg total) rectally at bedtime. Until having normal bowel movements. (Patient not taking: Reported on 05/25/2023) 12 suppository 0   carbamazepine (TEGRETOL) 200 MG tablet TAKE 1/2 TABLET TWICE DAILY 90 tablet 3   escitalopram (LEXAPRO) 20 MG tablet Take 1 tablet (20 mg total) by mouth daily. 90 tablet 0   ferrous gluconate (FERGON) 324 MG tablet Take 1 tablet (324 mg total) by mouth daily with breakfast. If makes you constipated, reduce to every other day. 90 tablet 3   fluticasone (FLONASE) 50 MCG/ACT nasal spray SPRAY 2 SPRAYS INTO EACH NOSTRIL  EVERY DAY 48 mL 2   Fluticasone-Umeclidin-Vilant (TRELEGY ELLIPTA) 100-62.5-25 MCG/ACT AEPB Inhale 1 puff into the lungs daily. 60 each 6   folic acid (FOLVITE) 1 MG tablet Take 1 tablet (1 mg total) by mouth daily. (Patient not taking: Reported on 05/25/2023) 90 tablet 3   furosemide (LASIX) 20 MG tablet TAKE 2 TABLETS BY MOUTH 2 TIMES DAILY. 360 tablet 1   halobetasol (ULTRAVATE) 0.05 % ointment APPLY TO AFFECTED AREA TWICE A DAY 45 g 0   lactulose (CHRONULAC) 10 GM/15ML SOLN enema Place 300 mLs rectally daily as needed for severe constipation. Until having normal bowel movements. 1200 mL 3   levothyroxine (SYNTHROID) 50 MCG tablet TAKE 1 TABLET BY MOUTH DAILY BEFORE BREAKFAST 90 tablet 2   losartan (COZAAR) 50 MG tablet TAKE 1 TABLET BY MOUTH EVERYDAY AT BEDTIME 90 tablet 1   Magnesium Oxide 400 MG CAPS Take 1 capsule (400 mg total) by mouth daily. 90 capsule 3   metoprolol tartrate (LOPRESSOR) 25 MG tablet TAKE 1 TABLET BY MOUTH TWICE A DAY 180 tablet 3   MIRALAX 17 GM/SCOOP powder TAKE 17 G BY MOUTH 2 (TWO) TIMES DAILY. UNTIL HAVING NORMAL BOWEL MOVEMENTS. 510 g 1   nystatin (MYCOSTATIN) 100000 UNIT/ML suspension TAKE 5 MLS BY MOUTH 2 (TWO) TIMES DAILY. SWISH AND SPIT. 300 mL 0   nystatin (MYCOSTATIN/NYSTOP) powder Apply 1 Application topically 2 (two) times daily. Apply to groin and butt - anywhere affected by rash. 60 g 3   pantoprazole (PROTONIX) 40 MG tablet TAKE 1 TABLET BY MOUTH EVERY DAY 90 tablet 0   potassium chloride SA (KLOR-CON M20) 20 MEQ tablet Take 1 tablet (20 mEq total) by mouth 2 (two) times daily. 180 tablet 3   psyllium (HYDROCIL/METAMUCIL) 95 % PACK Take 1 packet by mouth daily. (Patient not taking: Reported on 05/25/2023) 240 each 0   QUEtiapine (SEROQUEL) 50 MG tablet TAKE 1 TABLET EVERY MORNING AND 2 AT NIGHT 270 tablet 3   senna (SENOKOT) 8.6 MG TABS tablet Take 1 tablet (8.6 mg total) by mouth 2 (two) times daily. (Patient not taking: Reported on 05/25/2023) 120 tablet 0    No current facility-administered medications for this visit.    Allergies as of 07/16/2023 - Review Complete 07/16/2023  Allergen Reaction Noted   Lisinopril Cough 02/26/2015   Codeine phosphate Itching 01/24/2007   Morphine and codeine Itching 01/12/2014    Family History  Problem Relation Age of Onset   Dementia Mother    Alcoholism Mother    COPD Father    Diabetes Sister    Hypertension Sister    COPD Brother    Lung cancer Brother    Clotting disorder Brother    Kidney disease Neg Hx    Stroke Neg Hx     Social History   Socioeconomic History  Marital status: Widowed    Spouse name: Not on file   Number of children: Not on file   Years of education: Not on file   Highest education level: Not on file  Occupational History   Not on file  Tobacco Use   Smoking status: Never    Passive exposure: Past   Smokeless tobacco: Never  Vaping Use   Vaping status: Never Used  Substance and Sexual Activity   Alcohol use: Yes    Comment: occasional   Drug use: No   Sexual activity: Never    Birth control/protection: None  Other Topics Concern   Not on file  Social History Narrative   Current Social History 08/18/2017            Patient lives with daughter and 14 yo granddaughter in one level home 08/18/2017   Transportation: Patient relies on [employer] 08/18/2017   Important Relationships: "Nobody" 08/18/2017    Pets: Dog (Pebbles) cat Loree Fee) and two hamsters 08/18/2017   Education / Work:  8th grade/ Works around house 08/18/2017   Interests / Fun: TV and crosswords 08/18/2017   Current Stressors: Living situation and food concerns 08/18/2017   L. Leward Quan, RN, BSN                                                                                                 Social Determinants of Health   Financial Resource Strain: Low Risk  (05/24/2023)   Overall Financial Resource Strain (CARDIA)    Difficulty of Paying Living Expenses: Not hard at all  Food Insecurity: Food  Insecurity Present (05/24/2023)   Hunger Vital Sign    Worried About Running Out of Food in the Last Year: Sometimes true    Ran Out of Food in the Last Year: Sometimes true  Transportation Needs: No Transportation Needs (05/24/2023)   PRAPARE - Administrator, Civil Service (Medical): No    Lack of Transportation (Non-Medical): No  Physical Activity: Inactive (05/24/2023)   Exercise Vital Sign    Days of Exercise per Week: 0 days    Minutes of Exercise per Session: 0 min  Stress: No Stress Concern Present (05/24/2023)   [employer] of Occupational Health - Occupational Stress Questionnaire    Feeling of Stress : Only a little  Social Connections: Socially Isolated (05/24/2023)   Social Connection and Isolation Panel [NHANES]    Frequency of Communication with Friends and Family: Never    Frequency of Social Gatherings with Friends and Family: Three times a week    Attends Religious Services: Never    Active Member of Clubs or Organizations: No    Attends Banker Meetings: Never    Marital Status: Widowed  Intimate Partner Violence: Not At Risk (05/24/2023)   Humiliation, Afraid, Rape, and Kick questionnaire    Fear of Current or Ex-Partner: No    Emotionally Abused: No    Physically Abused: No    Sexually Abused: No    Review of Systems:    Constitutional: No weight loss, fever, chills, weakness or fatigue HEENT: Eyes: No change  in vision               Ears, Nose, Throat:  No change in hearing or congestion Skin: No rash or itching Cardiovascular: No chest pain, chest pressure or palpitations   Respiratory: No SOB or cough Gastrointestinal: See HPI and otherwise negative Genitourinary: No dysuria or change in urinary frequency Neurological: No headache, dizziness or syncope Musculoskeletal: No new muscle or joint pain Hematologic: No bleeding or bruising Psychiatric: No history of depression or anxiety    Physical Exam:  Vital signs: There were  no vitals taken for this visit.  Constitutional: NAD, Well developed, Well nourished, alert and cooperative Head:  Normocephalic and atraumatic. Eyes:   PEERL, EOMI. No icterus. Conjunctiva pink. Respiratory: Respirations even and unlabored. Lungs clear to auscultation bilaterally.   No wheezes, crackles, or rhonchi.  Cardiovascular:  Regular rate and rhythm. No peripheral edema, cyanosis or pallor.  Gastrointestinal:  Soft, nondistended, nontender. No rebound or guarding. Normal bowel sounds. No appreciable masses or hepatomegaly. Rectal:  Not performed.  Msk:  Symmetrical without gross deformities. Without edema, no deformity or joint abnormality.  Neurologic:  Alert and  oriented x4;  grossly normal neurologically.  Skin:   Dry and intact without significant lesions or rashes. Psychiatric: Oriented to person, place and time. Demonstrates good judgement and reason without abnormal affect or behaviors.   RELEVANT LABS AND IMAGING: CBC    Component Value Date/Time   WBC 7.7 05/13/2023 1441   WBC 9.6 05/13/2022 0232   RBC 3.57 (L) 05/13/2023 1441   RBC 3.83 (L) 05/13/2022 0232   HGB 9.8 (L) 05/13/2023 1441   HCT 29.6 (L) 05/13/2023 1441   PLT 275 05/13/2023 1441   MCV 83 05/13/2023 1441   MCH 27.5 05/13/2023 1441   MCH 26.6 05/13/2022 0232   MCHC 33.1 05/13/2023 1441   MCHC 31.3 05/13/2022 0232   RDW 13.0 05/13/2023 1441   LYMPHSABS 1.4 05/13/2023 1441   MONOABS 0.3 03/08/2021 1507   EOSABS 0.0 05/13/2023 1441   BASOSABS 0.0 05/13/2023 1441    CMP     Component Value Date/Time   NA 140 05/13/2023 1137   K 3.2 (L) 05/13/2023 1137   CL 101 05/13/2023 1137   CO2 24 05/13/2023 1137   GLUCOSE 90 05/13/2023 1137   GLUCOSE 99 05/14/2022 0440   BUN 12 05/13/2023 1137   CREATININE 1.14 (H) 05/13/2023 1137   CALCIUM 8.0 (L) 05/13/2023 1137   PROT 5.9 (L) 05/14/2022 0440   PROT 5.7 (L) 05/06/2022 1154   ALBUMIN 2.9 (L) 05/14/2022 0440   ALBUMIN 3.4 (L) 05/06/2022 1154   AST  11 (L) 05/14/2022 0440   ALT 10 05/14/2022 0440   ALKPHOS 100 05/14/2022 0440   BILITOT 0.6 05/14/2022 0440   BILITOT 0.4 05/06/2022 1154   GFRNONAA 42 (L) 05/14/2022 0440   GFRAA 57 (L) 11/08/2019 1638     Assessment/Plan:   77 year old female history of COPD (not on supplemental oxygen), CHF, CVA (not on anticoagulation) presents for evaluation of change in bowel habits, lower abdominal pain, and dysphagia.  Known history of esophageal strictures with previous dilation in 2015  Hx of colonic polyps Bowel habit changes Acute change in bowel habits 1 month ago and developed constipation.  Some control with MiraLAX daily.  Associated lower abdominal pain.  History of colon polyps.  Recommend colonoscopy for evaluation of obstructive polyp. - Start taking Miralax 1 capful (17 grams) 1x / day for 1 week.   If this  is not effective, increase to 1 dose 2x / day for 1 week.   If this is still not effective, increase to two capfuls (34 grams) 2x / day.   Can adjust dose as needed based on response. Can take 1/2 cap daily, skip days, or increase per day.   --- benefiber/metamucil daily --- increase water intake to 60-100oz water per day --- IB gard prn for pain (samples provied) --- colonoscopy for further evaluation --- I thoroughly discussed the procedure with the patient (at bedside) to include nature of the procedure, alternatives, benefits, and risks (including but not limited to bleeding, infection, perforation, anesthesia/cardiac pulmonary complications).  Patient verbalized understanding and gave verbal consent to proceed with procedure.  --- follow up based on procedure  Dysphagia, unspecified type Dysphagia to solids and liquids, history of stricture. --- EGD with dilation for further evaluation --- I thoroughly discussed the procedure with the patient (at bedside) to include nature of the procedure, alternatives, benefits, and risks (including but not limited to bleeding, infection,  perforation, anesthesia/cardiac pulmonary complications).  Patient verbalized understanding and gave verbal consent to proceed with procedure.   --- If continued symptoms with liquids after EGD, would recommend modified barium swallow to assess aspiration risk given age.  Lara Mulch [facility] 07/16/2023, 10:35 AM  Cc: Caro Laroche, DO

## 2023-07-16 NOTE — Patient Instructions (Addendum)
  Start taking Miralax 1 capful (17 grams) 1x / day for 1 week.   If this is not effective, increase to 1 dose 2x / day for 1 week.   If this is still not effective, increase to two capfuls (34 grams) 2x / day.   Can adjust dose as needed based on response. Can take 1/2 cap daily, skip days, or increase per day.    Benefiber/metamucil daily  60-100oz water daily  IB gard as needed for pain  _______________________________________________________  If your blood pressure at your visit was 140/90 or greater, please contact your primary care physician to follow up on this.  _______________________________________________________  If you are age 49 or older, your body mass index should be between 23-30. Your Body mass index is 34.01 kg/m. If this is out of the aforementioned range listed, please consider follow up with your Primary Care Provider.  If you are age 54 or younger, your body mass index should be between 19-25. Your Body mass index is 34.01 kg/m. If this is out of the aformentioned range listed, please consider follow up with your Primary Care Provider.   ________________________________________________________  The [facility] providers would like to encourage you to use [facility] to communicate with providers for non-urgent requests or questions.  Due to long hold times on the telephone, sending your provider a message by [facility] may be a faster and more efficient way to get a response.  Please allow 48 business hours for a response.  Please remember that this is for non-urgent requests.  __________________________________________________  Erica Morris have been scheduled for an endoscopy and colonoscopy. Please follow the written instructions given to you at your visit today.  Please pick up your prep supplies at the pharmacy within the next 1-3 days.  If you use inhalers (even only as needed), please bring them with you on the day of your procedure.  DO NOT TAKE 7 DAYS PRIOR TO  TEST- Trulicity (dulaglutide) Ozempic, Wegovy (semaglutide) Mounjaro (tirzepatide) Bydureon Bcise (exanatide extended release)  DO NOT TAKE 1 DAY PRIOR TO YOUR TEST Rybelsus (semaglutide) Adlyxin (lixisenatide) Victoza (liraglutide) Byetta (exanatide)  Due to recent changes in healthcare laws, you may see the results of your imaging and laboratory studies on MyChart before your provider has had a chance to review them.  We understand that in some cases there may be results that are confusing or concerning to you. Not all laboratory results come back in the same time frame and the provider may be waiting for multiple results in order to interpret others.  Please give [place] 48 hours in order for your provider to thoroughly review all the results before contacting the office for clarification of your results.   It was a pleasure to see you today!  Thank you for trusting me with your gastrointestinal care!     ___________________________________________________________________________

## 2023-07-20 ENCOUNTER — Other Ambulatory Visit: Payer: Self-pay | Admitting: Family Medicine

## 2023-07-20 DIAGNOSIS — K219 Gastro-esophageal reflux disease without esophagitis: Secondary | ICD-10-CM

## 2023-07-21 NOTE — Progress Notes (Signed)
Agree with assessment/plan.  Raj Gupta, MD [facility] [phone]  

## 2023-09-13 ENCOUNTER — Other Ambulatory Visit (HOSPITAL_COMMUNITY): Payer: Self-pay

## 2023-09-14 ENCOUNTER — Other Ambulatory Visit: Payer: Self-pay

## 2023-09-14 ENCOUNTER — Encounter: Payer: Self-pay | Admitting: Gastroenterology

## 2023-09-14 ENCOUNTER — Other Ambulatory Visit (HOSPITAL_COMMUNITY): Payer: Self-pay

## 2023-09-14 MED ORDER — ATORVASTATIN CALCIUM 80 MG PO TABS
80.0000 mg | ORAL_TABLET | Freq: Every day | ORAL | 3 refills | Status: DC
  Filled 2023-09-14: qty 90, 90d supply, fill #0

## 2023-09-14 MED ORDER — MAGNESIUM OXIDE -MG SUPPLEMENT 400 (240 MG) MG PO TABS
400.0000 mg | ORAL_TABLET | Freq: Every day | ORAL | 3 refills | Status: AC
  Filled 2023-09-14 – 2023-11-03 (×3): qty 90, 90d supply, fill #0
  Filled 2024-05-09 – 2024-05-11 (×2): qty 90, 90d supply, fill #1

## 2023-09-14 MED ORDER — ALBUTEROL SULFATE HFA 108 (90 BASE) MCG/ACT IN AERS
2.0000 | INHALATION_SPRAY | Freq: Four times a day (QID) | RESPIRATORY_TRACT | 6 refills | Status: DC | PRN
  Filled 2023-09-14: qty 6.7, 25d supply, fill #0

## 2023-09-14 MED ORDER — QUETIAPINE FUMARATE 50 MG PO TABS
ORAL_TABLET | ORAL | 3 refills | Status: DC
  Filled 2023-09-14: qty 270, 90d supply, fill #0

## 2023-09-14 MED ORDER — FUROSEMIDE 20 MG PO TABS
40.0000 mg | ORAL_TABLET | Freq: Two times a day (BID) | ORAL | 1 refills | Status: DC
  Filled 2023-09-14: qty 360, 90d supply, fill #0

## 2023-09-14 MED ORDER — LOSARTAN POTASSIUM 50 MG PO TABS
50.0000 mg | ORAL_TABLET | Freq: Every day | ORAL | 1 refills | Status: DC
  Filled 2023-09-14: qty 90, 90d supply, fill #0
  Filled 2023-12-06: qty 90, 90d supply, fill #1

## 2023-09-14 MED ORDER — AMLODIPINE BESYLATE 10 MG PO TABS
10.0000 mg | ORAL_TABLET | Freq: Every day | ORAL | 3 refills | Status: DC
  Filled 2023-09-14: qty 90, 90d supply, fill #0

## 2023-09-14 MED ORDER — NYSTATIN 100000 UNIT/ML MT SUSP
5.0000 mL | Freq: Two times a day (BID) | OROMUCOSAL | 0 refills | Status: DC
  Filled 2023-09-14: qty 180, 18d supply, fill #0

## 2023-09-14 MED ORDER — TRELEGY ELLIPTA 100-62.5-25 MCG/ACT IN AEPB
1.0000 | INHALATION_SPRAY | Freq: Every day | RESPIRATORY_TRACT | 6 refills | Status: DC
  Filled 2023-09-14: qty 60, 30d supply, fill #0
  Filled 2023-10-18: qty 60, 30d supply, fill #1
  Filled 2023-11-15: qty 60, 30d supply, fill #2

## 2023-09-14 MED ORDER — NYSTATIN 100000 UNIT/GM EX POWD
CUTANEOUS | 3 refills | Status: DC
Start: 2023-05-13 — End: 2024-07-24
  Filled 2023-09-14: qty 60, 30d supply, fill #0
  Filled 2023-10-25: qty 60, 15d supply, fill #1
  Filled 2023-11-08: qty 60, 15d supply, fill #2
  Filled 2023-11-08: qty 60, 30d supply, fill #2

## 2023-09-14 MED ORDER — METOPROLOL TARTRATE 25 MG PO TABS
25.0000 mg | ORAL_TABLET | Freq: Two times a day (BID) | ORAL | 3 refills | Status: DC
  Filled 2023-09-14: qty 180, 90d supply, fill #0

## 2023-09-14 MED ORDER — POTASSIUM CHLORIDE CRYS ER 20 MEQ PO TBCR
20.0000 meq | EXTENDED_RELEASE_TABLET | Freq: Two times a day (BID) | ORAL | 3 refills | Status: DC
  Filled 2023-09-14 – 2024-05-11 (×3): qty 180, 90d supply, fill #0

## 2023-09-14 MED ORDER — FLUTICASONE PROPIONATE 50 MCG/ACT NA SUSP
2.0000 | Freq: Every day | NASAL | 2 refills | Status: AC
  Filled 2023-09-14 – 2023-11-03 (×2): qty 48, 90d supply, fill #0

## 2023-09-14 MED ORDER — CARBAMAZEPINE 200 MG PO TABS
100.0000 mg | ORAL_TABLET | Freq: Two times a day (BID) | ORAL | 3 refills | Status: DC
Start: 2023-02-05 — End: 2023-10-21
  Filled 2023-09-14: qty 90, 90d supply, fill #0

## 2023-09-15 ENCOUNTER — Other Ambulatory Visit (HOSPITAL_COMMUNITY): Payer: Self-pay

## 2023-09-15 ENCOUNTER — Other Ambulatory Visit: Payer: Self-pay

## 2023-09-16 ENCOUNTER — Other Ambulatory Visit: Payer: Self-pay

## 2023-09-21 ENCOUNTER — Other Ambulatory Visit: Payer: Self-pay

## 2023-09-21 ENCOUNTER — Other Ambulatory Visit (HOSPITAL_COMMUNITY): Payer: Self-pay

## 2023-09-21 DIAGNOSIS — K219 Gastro-esophageal reflux disease without esophagitis: Secondary | ICD-10-CM

## 2023-09-21 MED ORDER — PANTOPRAZOLE SODIUM 40 MG PO TBEC
40.0000 mg | DELAYED_RELEASE_TABLET | Freq: Every day | ORAL | 1 refills | Status: DC
  Filled 2023-09-21: qty 90, 90d supply, fill #0

## 2023-09-22 ENCOUNTER — Other Ambulatory Visit (HOSPITAL_COMMUNITY): Payer: Self-pay

## 2023-09-28 ENCOUNTER — Encounter: Payer: Self-pay | Admitting: Gastroenterology

## 2023-09-28 ENCOUNTER — Ambulatory Visit: Payer: 59 | Admitting: Gastroenterology

## 2023-09-28 VITALS — BP 120/56 | HR 55 | Temp 98.1°F | Resp 13 | Ht 63.0 in | Wt 192.0 lb

## 2023-09-28 DIAGNOSIS — D124 Benign neoplasm of descending colon: Secondary | ICD-10-CM

## 2023-09-28 DIAGNOSIS — K222 Esophageal obstruction: Secondary | ICD-10-CM

## 2023-09-28 DIAGNOSIS — Z860100 Personal history of colon polyps, unspecified: Secondary | ICD-10-CM | POA: Diagnosis not present

## 2023-09-28 DIAGNOSIS — K209 Esophagitis, unspecified without bleeding: Secondary | ICD-10-CM

## 2023-09-28 DIAGNOSIS — Z09 Encounter for follow-up examination after completed treatment for conditions other than malignant neoplasm: Secondary | ICD-10-CM

## 2023-09-28 DIAGNOSIS — R1310 Dysphagia, unspecified: Secondary | ICD-10-CM

## 2023-09-28 DIAGNOSIS — R194 Change in bowel habit: Secondary | ICD-10-CM

## 2023-09-28 MED ORDER — SODIUM CHLORIDE 0.9 % IV SOLN: 500.0000 mL | INTRAVENOUS | Status: DC

## 2023-09-28 NOTE — Op Note (Signed)
[facility] Patient Name: Erica Morris Procedure Date: 09/28/2023 2:06 PM MRN: [id number] Endoscopist: Lynann Bologna , MD, [id number] Age: 77 Referring MD:  Date of Birth: 1946-02-28 Gender: Female Account #: [id number] Procedure:                Colonoscopy Indications:              High risk colon cancer surveillance: Personal                            history of colonic polyps Medicines:                Monitored Anesthesia Care Procedure:                Pre-Anesthesia Assessment:                           - Prior to the procedure, a History and Physical                            was performed, and patient medications and                            allergies were reviewed. The patient's tolerance of                            previous anesthesia was also reviewed. The risks                            and benefits of the procedure and the sedation                            options and risks were discussed with the patient.                            All questions were answered, and informed consent                            was obtained. Prior Anticoagulants: The patient has                            taken no anticoagulant or antiplatelet agents. ASA                            Grade Assessment: III - A patient with severe                            systemic disease. After reviewing the risks and                            benefits, the patient was deemed in satisfactory                            condition to undergo the procedure.  After obtaining informed consent, the colonoscope                            was passed under direct vision. Throughout the                            procedure, the patient's blood pressure, pulse, and                            oxygen saturations were monitored continuously. The                            Olympus Scope [id number] was introduced through the                            anus and advanced to the the  cecum, identified by                            appendiceal orifice and ileocecal valve. The                            colonoscopy was performed without difficulty. The                            patient tolerated the procedure well. The quality                            of the bowel preparation was adequate to identify                            polyps. There was adherent stool specially in the                            right side of the colon specially in the cecum                            which could not be fully washed despite aggressive                            suctioning and aspiration. The ileocecal valve,                            appendiceal orifice, and rectum were photographed. Scope In: 2:30:16 PM Scope Out: 2:46:44 PM Scope Withdrawal Time: 0 hours 9 minutes 15 seconds  Total Procedure Duration: 0 hours 16 minutes 28 seconds  Findings:                 Two sessile polyps were found in the proximal                            descending colon and mid descending colon. The                            polyps  were 4 to 6 mm in size. These polyps were                            removed with a cold snare. Resection and retrieval                            were complete.                           Multiple medium-mouthed diverticula were found in                            the sigmoid colon.                           Non-bleeding internal hemorrhoids were found during                            retroflexion. The hemorrhoids were small and Grade                            I (internal hemorrhoids that do not prolapse).                           The exam was otherwise without abnormality on                            direct and retroflexion views. Complications:            No immediate complications. Estimated Blood Loss:     Estimated blood loss: none. Impression:               - Two 4 to 6 mm polyps in the proximal descending                            colon and in the mid  descending colon, removed with                            a cold snare. Resected and retrieved.                           - Moderate sigmoid diverticulosis.                           - Non-bleeding internal hemorrhoids.                           - The examination was otherwise normal on direct                            and retroflexion views. Recommendation:           - Patient has a contact number available for                            emergencies. The signs and symptoms of potential  delayed complications were discussed with the                            patient. Return to normal activities tomorrow.                            Written discharge instructions were provided to the                            patient.                           - High fiber diet.                           - Continue present medications.                           - Await pathology results.                           - Repeat colonoscopy for surveillance based on                            pathology results. Likely not needed due to age.                           - The findings and recommendations were discussed                            with the patient's family. Lynann Bologna, MD 09/28/2023 2:53:08 PM This report has been signed electronically.

## 2023-09-28 NOTE — Progress Notes (Signed)
Called to room to assist during endoscopic procedure.  Patient ID and intended procedure confirmed with present staff. Received instructions for my participation in the procedure from the performing physician.  

## 2023-09-28 NOTE — Patient Instructions (Signed)
Discharge instructions given. Handouts on polyps,Diverticulosis,Hemorrhoids and Dilatation diet. Resume previous medications. See recommendations. YOU HAD AN ENDOSCOPIC PROCEDURE TODAY AT THE  ENDOSCOPY CENTER:   Refer to the procedure report that was given to you for any specific questions about what was found during the examination.  If the procedure report does not answer your questions, please call your gastroenterologist to clarify.  If you requested that your care partner not be given the details of your procedure findings, then the procedure report has been included in a sealed envelope for you to review at your convenience later.  YOU SHOULD EXPECT: Some feelings of bloating in the abdomen. Passage of more gas than usual.  Walking can help get rid of the air that was put into your GI tract during the procedure and reduce the bloating. If you had a lower endoscopy (such as a colonoscopy or flexible sigmoidoscopy) you may notice spotting of blood in your stool or on the toilet paper. If you underwent a bowel prep for your procedure, you may not have a normal bowel movement for a few days.  Please Note:  You might notice some irritation and congestion in your nose or some drainage.  This is from the oxygen used during your procedure.  There is no need for concern and it should clear up in a day or so.  SYMPTOMS TO REPORT IMMEDIATELY:  Following lower endoscopy (colonoscopy or flexible sigmoidoscopy):  Excessive amounts of blood in the stool  Significant tenderness or worsening of abdominal pains  Swelling of the abdomen that is new, acute  Fever of 100F or higher  Following upper endoscopy (EGD)  Vomiting of blood or coffee ground material  New chest pain or pain under the shoulder blades  Painful or persistently difficult swallowing  New shortness of breath  Fever of 100F or higher  Black, tarry-looking stools  For urgent or emergent issues, a gastroenterologist can be  reached at any hour by calling [phone]. Do not use MyChart messaging for urgent concerns.    DIET:  We do recommend a small meal at first, but then you may proceed to your regular diet.  Drink plenty of fluids but you should avoid alcoholic beverages for 24 hours.  ACTIVITY:  You should plan to take it easy for the rest of today and you should NOT DRIVE or use heavy machinery until tomorrow (because of the sedation medicines used during the test).    FOLLOW UP: Our staff will call the number listed on your records the next business day following your procedure.  We will call around 7:15- 8:00 am to check on you and address any questions or concerns that you may have regarding the information given to you following your procedure. If we do not reach you, we will leave a message.     If any biopsies were taken you will be contacted by phone or by letter within the next 1-3 weeks.  Please call us at [phone] if you have not heard about the biopsies in 3 weeks.    SIGNATURES/CONFIDENTIALITY: You and/or your care partner have signed paperwork which will be entered into your electronic medical record.  These signatures attest to the fact that that the information above on your After Visit Summary has been reviewed and is understood.  Full responsibility of the confidentiality of this discharge information lies with you and/or your care-partner.

## 2023-09-28 NOTE — Progress Notes (Signed)
To pacu, VSS. Report to [email or web address] 

## 2023-09-28 NOTE — Progress Notes (Signed)
Chief Complaint: Change in bowel habits Primary GI MD: (Dr. Arlyce Dice)   HPI: 77 year old female with medical history of CHF, COPD, CVA (on aspirin), presents for evaluation of change in bowel habits.   Patient states over the last month she has been experiencing constipation.  She states she will go 1 to 2 days without a bowel movement sometimes.  Often has to strain.  She has been taking MiraLAX 1 capful daily which helps her produce a bowel movement in the evening.  She feels sometimes it does not work as effectively as it should since it takes all day for her to have to have a bowel movement.  Patient also reports lower abdominal pain that began with her constipation.  Sometimes make it better with a bowel movement.  Denies melena/hematochezia.   Patient also is having dysphagia to solids and liquids.  States she can get choked on a piece of popcorn or even water.  Previous history of strictures with EGD with dilation in 2015.  Denies associated GERD.     PREVIOUS GI WORKUP    Colonoscopy 03/2014 for screening with Dr. Arlyce Dice - Sessile polyp 2 mm in cecum - Sessile polyp 3 mm in sigmoid - Moderate diverticulosis - Internal hemorrhoids EGD for GERD and dysphagia  Due for repeat 2022   EGD 03/2014 for GERD and dysphagia - Esophageal stricture s/p Maloney dilation -Biopsies       Past Medical History:  Diagnosis Date   ABDOMINAL PAIN, RECURRENT 03/29/2008   ANXIETY 10/07/2007   Candidiasis of mouth 05/30/2010   GERD 07/25/2007   Greater trochanteric bursitis 04/04/2020    R hip   Hernia, hiatal     History of colonic polyps 06/04/2014   History of CVA (cerebrovascular accident)     History of vitrectomy 09/05/2020    Macular hole surgical repair 2014 OD, improved acuity from 20/400 to now 20/50   HYPERTENSION 07/25/2007   Hypomagnesemia 05/14/2022   Multifocal pneumonia 05/13/2022   NSTEMI (non-ST elevated myocardial infarction) (HCC) 10/2017   Posterior vitreous detachment  of left eye 09/05/2020   Stroke (HCC)     Thrush of mouth and esophagus (HCC) 05/30/2010    Qualifier: Diagnosis of  By: Abner Greenspan MD, Stacey     Vertigo 08/10/2017    Unclear if vertigo or other dizziness.   CT angio neck 05/18/16 neg MRI of brain 02/2016 neg                Past Surgical History:  Procedure Laterality Date   ABDOMINAL HYSTERECTOMY       BREAST SURGERY        bx   KNEE ARTHROSCOPY       LEFT HEART CATH AND CORONARY ANGIOGRAPHY N/A 11/02/2017    Procedure: LEFT HEART CATH AND CORONARY ANGIOGRAPHY;  Surgeon: Kathleene Hazel, MD;  Location: [facility];  Service: Cardiovascular;  Laterality: N/A;                Current Outpatient Medications  Medication Sig Dispense Refill   acetaminophen (TYLENOL) 500 MG tablet Take 1,000 mg by mouth every 6 (six) hours as needed for moderate pain.       albuterol (VENTOLIN HFA) 108 (90 Base) MCG/ACT inhaler Inhale 1-2 puffs into the lungs every 6 (six) hours as needed for wheezing or shortness of breath. 8.5 each 6   amLODipine (NORVASC) 10 MG tablet TAKE 1 TABLET BY MOUTH EVERY DAY 90 tablet 3   aspirin EC  81 MG tablet Take 81 mg by mouth every morning.        atorvastatin (LIPITOR) 80 MG tablet TAKE 1 TABLET BY MOUTH EVERY DAY 90 tablet 3   benzonatate (TESSALON) 100 MG capsule Take 1 capsule (100 mg total) by mouth 2 (two) times daily as needed for cough. 20 capsule 0   bisacodyl (DULCOLAX) 10 MG suppository Place 1 suppository (10 mg total) rectally at bedtime. Until having normal bowel movements. (Patient not taking: Reported on 05/25/2023) 12 suppository 0   carbamazepine (TEGRETOL) 200 MG tablet TAKE 1/2 TABLET TWICE DAILY 90 tablet 3   escitalopram (LEXAPRO) 20 MG tablet Take 1 tablet (20 mg total) by mouth daily. 90 tablet 0   ferrous gluconate (FERGON) 324 MG tablet Take 1 tablet (324 mg total) by mouth daily with breakfast. If makes you constipated, reduce to every other day. 90 tablet 3   fluticasone (FLONASE) 50  MCG/ACT nasal spray SPRAY 2 SPRAYS INTO EACH NOSTRIL EVERY DAY 48 mL 2   Fluticasone-Umeclidin-Vilant (TRELEGY ELLIPTA) 100-62.5-25 MCG/ACT AEPB Inhale 1 puff into the lungs daily. 60 each 6   folic acid (FOLVITE) 1 MG tablet Take 1 tablet (1 mg total) by mouth daily. (Patient not taking: Reported on 05/25/2023) 90 tablet 3   furosemide (LASIX) 20 MG tablet TAKE 2 TABLETS BY MOUTH 2 TIMES DAILY. 360 tablet 1   halobetasol (ULTRAVATE) 0.05 % ointment APPLY TO AFFECTED AREA TWICE A DAY 45 g 0   lactulose (CHRONULAC) 10 GM/15ML SOLN enema Place 300 mLs rectally daily as needed for severe constipation. Until having normal bowel movements. 1200 mL 3   levothyroxine (SYNTHROID) 50 MCG tablet TAKE 1 TABLET BY MOUTH DAILY BEFORE BREAKFAST 90 tablet 2   losartan (COZAAR) 50 MG tablet TAKE 1 TABLET BY MOUTH EVERYDAY AT BEDTIME 90 tablet 1   Magnesium Oxide 400 MG CAPS Take 1 capsule (400 mg total) by mouth daily. 90 capsule 3   metoprolol tartrate (LOPRESSOR) 25 MG tablet TAKE 1 TABLET BY MOUTH TWICE A DAY 180 tablet 3   MIRALAX 17 GM/SCOOP powder TAKE 17 G BY MOUTH 2 (TWO) TIMES DAILY. UNTIL HAVING NORMAL BOWEL MOVEMENTS. 510 g 1   nystatin (MYCOSTATIN) 100000 UNIT/ML suspension TAKE 5 MLS BY MOUTH 2 (TWO) TIMES DAILY. SWISH AND SPIT. 300 mL 0   nystatin (MYCOSTATIN/NYSTOP) powder Apply 1 Application topically 2 (two) times daily. Apply to groin and butt - anywhere affected by rash. 60 g 3   pantoprazole (PROTONIX) 40 MG tablet TAKE 1 TABLET BY MOUTH EVERY DAY 90 tablet 0   potassium chloride SA (KLOR-CON M20) 20 MEQ tablet Take 1 tablet (20 mEq total) by mouth 2 (two) times daily. 180 tablet 3   psyllium (HYDROCIL/METAMUCIL) 95 % PACK Take 1 packet by mouth daily. (Patient not taking: Reported on 05/25/2023) 240 each 0   QUEtiapine (SEROQUEL) 50 MG tablet TAKE 1 TABLET EVERY MORNING AND 2 AT NIGHT 270 tablet 3   senna (SENOKOT) 8.6 MG TABS tablet Take 1 tablet (8.6 mg total) by mouth 2 (two) times daily.  (Patient not taking: Reported on 05/25/2023) 120 tablet 0      No current facility-administered medications for this visit.             Allergies as of 07/16/2023 - Review Complete 07/16/2023  Allergen Reaction Noted   Lisinopril Cough 02/26/2015   Codeine phosphate Itching 01/24/2007   Morphine and codeine Itching 01/12/2014  Family History  Problem Relation Age of Onset   Dementia Mother     Alcoholism Mother     COPD Father     Diabetes Sister     Hypertension Sister     COPD Brother     Lung cancer Brother     Clotting disorder Brother     Kidney disease Neg Hx     Stroke Neg Hx            Social History         Socioeconomic History   Marital status: Widowed      Spouse name: Not on file   Number of children: Not on file   Years of education: Not on file   Highest education level: Not on file  Occupational History   Not on file  Tobacco Use   Smoking status: Never      Passive exposure: Past   Smokeless tobacco: Never  Vaping Use   Vaping status: Never Used  Substance and Sexual Activity   Alcohol use: Yes      Comment: occasional   Drug use: No   Sexual activity: Never      Birth control/protection: None  Other Topics Concern   Not on file  Social History Narrative    Current Social History 08/18/2017                Patient lives with daughter and 68 yo granddaughter in one level home 08/18/2017    Transportation: Patient relies on [employer] 08/18/2017    Important Relationships: "Nobody" 08/18/2017     Pets: Dog (Pebbles) cat Loree Fee) and two hamsters 08/18/2017    Education / Work:  8th grade/ Works around house 08/18/2017    Interests / Fun: TV and crosswords 08/18/2017    Current Stressors: Living situation and food concerns 08/18/2017    L. Leward Quan, RN, BSN                                                                                                   Social Determinants of Health        Financial Resource Strain: Low Risk  (05/24/2023)     Overall Financial Resource Strain (CARDIA)     Difficulty of Paying Living Expenses: Not hard at all  Food Insecurity: Food Insecurity Present (05/24/2023)    Hunger Vital Sign     Worried About Running Out of Food in the Last Year: Sometimes true     Ran Out of Food in the Last Year: Sometimes true  Transportation Needs: No Transportation Needs (05/24/2023)    PRAPARE - Therapist, art (Medical): No     Lack of Transportation (Non-Medical): No  Physical Activity: Inactive (05/24/2023)    Exercise Vital Sign     Days of Exercise per Week: 0 days     Minutes of Exercise per Session: 0 min  Stress: No Stress Concern Present (05/24/2023)    [employer] of Occupational Health - Occupational Stress Questionnaire     Feeling of Stress : Only a little  Social Connections: Socially Isolated (05/24/2023)    Social Connection and Isolation Panel [NHANES]     Frequency of Communication with Friends and Family: Never     Frequency of Social Gatherings with Friends and Family: Three times a week     Attends Religious Services: Never     Active Member of Clubs or Organizations: No     Attends Banker Meetings: Never     Marital Status: Widowed  Intimate Partner Violence: Not At Risk (05/24/2023)    Humiliation, Afraid, Rape, and Kick questionnaire     Fear of Current or Ex-Partner: No     Emotionally Abused: No     Physically Abused: No     Sexually Abused: No      Review of Systems:    Constitutional: No weight loss, fever, chills, weakness or fatigue HEENT: Eyes: No change in vision               Ears, Nose, Throat:  No change in hearing or congestion Skin: No rash or itching Cardiovascular: No chest pain, chest pressure or palpitations   Respiratory: No SOB or cough Gastrointestinal: See HPI and otherwise negative Genitourinary: No dysuria or change in urinary frequency Neurological: No headache, dizziness or syncope Musculoskeletal: No new  muscle or joint pain Hematologic: No bleeding or bruising Psychiatric: No history of depression or anxiety      Physical Exam:  Vital signs: There were no vitals taken for this visit.   Constitutional: NAD, Well developed, Well nourished, alert and cooperative Head:  Normocephalic and atraumatic. Eyes:   PEERL, EOMI. No icterus. Conjunctiva pink. Respiratory: Respirations even and unlabored. Lungs clear to auscultation bilaterally.   No wheezes, crackles, or rhonchi.  Cardiovascular:  Regular rate and rhythm. No peripheral edema, cyanosis or pallor.  Gastrointestinal:  Soft, nondistended, nontender. No rebound or guarding. Normal bowel sounds. No appreciable masses or hepatomegaly. Rectal:  Not performed.  Msk:  Symmetrical without gross deformities. Without edema, no deformity or joint abnormality.  Neurologic:  Alert and  oriented x4;  grossly normal neurologically.  Skin:   Dry and intact without significant lesions or rashes. Psychiatric: Oriented to person, place and time. Demonstrates good judgement and reason without abnormal affect or behaviors.     RELEVANT LABS AND IMAGING: CBC Labs (Brief)          Component Value Date/Time    WBC 7.7 05/13/2023 1441    WBC 9.6 05/13/2022 0232    RBC 3.57 (L) 05/13/2023 1441    RBC 3.83 (L) 05/13/2022 0232    HGB 9.8 (L) 05/13/2023 1441    HCT 29.6 (L) 05/13/2023 1441    PLT 275 05/13/2023 1441    MCV 83 05/13/2023 1441    MCH 27.5 05/13/2023 1441    MCH 26.6 05/13/2022 0232    MCHC 33.1 05/13/2023 1441    MCHC 31.3 05/13/2022 0232    RDW 13.0 05/13/2023 1441    LYMPHSABS 1.4 05/13/2023 1441    MONOABS 0.3 03/08/2021 1507    EOSABS 0.0 05/13/2023 1441    BASOSABS 0.0 05/13/2023 1441        CMP     Labs (Brief)          Component Value Date/Time    NA 140 05/13/2023 1137    K 3.2 (L) 05/13/2023 1137    CL 101 05/13/2023 1137    CO2 24 05/13/2023 1137    GLUCOSE 90 05/13/2023 1137    GLUCOSE 99 05/14/2022  0440     BUN 12 05/13/2023 1137    CREATININE 1.14 (H) 05/13/2023 1137    CALCIUM 8.0 (L) 05/13/2023 1137    PROT 5.9 (L) 05/14/2022 0440    PROT 5.7 (L) 05/06/2022 1154    ALBUMIN 2.9 (L) 05/14/2022 0440    ALBUMIN 3.4 (L) 05/06/2022 1154    AST 11 (L) 05/14/2022 0440    ALT 10 05/14/2022 0440    ALKPHOS 100 05/14/2022 0440    BILITOT 0.6 05/14/2022 0440    BILITOT 0.4 05/06/2022 1154    GFRNONAA 42 (L) 05/14/2022 0440    GFRAA 57 (L) 11/08/2019 1638          Assessment/Plan:    77 year old female history of COPD (not on supplemental oxygen), CHF, CVA (not on anticoagulation) presents for evaluation of change in bowel habits, lower abdominal pain, and dysphagia.  Known history of esophageal strictures with previous dilation in 2015   Hx of colonic polyps Bowel habit changes Acute change in bowel habits 1 month ago and developed constipation.  Some control with MiraLAX daily.  Associated lower abdominal pain.  History of colon polyps.  Recommend colonoscopy for evaluation of obstructive polyp. - Start taking Miralax 1 capful (17 grams) 1x / day for 1 week.   If this is not effective, increase to 1 dose 2x / day for 1 week.   If this is still not effective, increase to two capfuls (34 grams) 2x / day.   Can adjust dose as needed based on response. Can take 1/2 cap daily, skip days, or increase per day.   --- benefiber/metamucil daily --- increase water intake to 60-100oz water per day --- IB gard prn for pain (samples provied) --- colonoscopy for further evaluation --- I thoroughly discussed the procedure with the patient (at bedside) to include nature of the procedure, alternatives, benefits, and risks (including but not limited to bleeding, infection, perforation, anesthesia/cardiac pulmonary complications).  Patient verbalized understanding and gave verbal consent to proceed with procedure.  --- follow up based on procedure   Dysphagia, unspecified type Dysphagia to solids and liquids,  history of stricture. --- EGD with dilation for further evaluation --- I thoroughly discussed the procedure with the patient (at bedside) to include nature of the procedure, alternatives, benefits, and risks (including but not limited to bleeding, infection, perforation, anesthesia/cardiac pulmonary complications).  Patient verbalized understanding and gave verbal consent to proceed with procedure.   --- If continued symptoms with liquids after EGD, would recommend modified barium swallow to assess aspiration risk given age.   [facility]   Attending physician's note   I have taken history, reviewed the chart and examined the patient. I performed a substantive portion of this encounter, including complete performance of at least one of the key components, in conjunction with the APP. I agree with the Advanced Practitioner's note, impression and recommendations.    Edman Circle, MD [facility] [phone]

## 2023-09-28 NOTE — Op Note (Signed)
[facility] Patient Name: Erica Morris Procedure Date: 09/28/2023 2:11 PM MRN: [id number] Endoscopist: Lynann Bologna , MD, [id number] Age: 77 Referring MD:  Date of Birth: December 21, 1945 Gender: Female Account #: [id number] Procedure:                Upper GI endoscopy Indications:              Dysphagia Medicines:                Monitored Anesthesia Care Procedure:                Pre-Anesthesia Assessment:                           - Prior to the procedure, a History and Physical                            was performed, and patient medications and                            allergies were reviewed. The patient's tolerance of                            previous anesthesia was also reviewed. The risks                            and benefits of the procedure and the sedation                            options and risks were discussed with the patient.                            All questions were answered, and informed consent                            was obtained. Prior Anticoagulants: The patient has                            taken no anticoagulant or antiplatelet agents. ASA                            Grade Assessment: III - A patient with severe                            systemic disease. After reviewing the risks and                            benefits, the patient was deemed in satisfactory                            condition to undergo the procedure.                           After obtaining informed consent, the endoscope was  passed under direct vision. Throughout the                            procedure, the patient's blood pressure, pulse, and                            oxygen saturations were monitored continuously. The                            Olympus Scope [id number] was introduced through the                            mouth, and advanced to the second part of duodenum.                            The upper GI endoscopy was  accomplished without                            difficulty. The patient tolerated the procedure                            well. Scope In: Scope Out: Findings:                 The examined esophagus was mildly tortuous with few                            circular furrows. Biopsies were obtained from the                            proximal and distal esophagus with cold forceps for                            histology of suspected eosinophilic esophagitis.                           A non-obstructing and moderate Schatzki ring was                            found at the gastroesophageal junction with luminal                            diameter of approximately 12 mm. The scope was                            withdrawn. Dilation was performed with a Maloney                            dilator with mild resistance at 50 Fr.                           A 4 cm hiatal hernia was present.                           The entire  examined stomach was normal. Biopsies                            were taken with a cold forceps for histology.                           The examined duodenum was normal. Complications:            No immediate complications. Estimated Blood Loss:     Estimated blood loss: none. Impression:               - Schatzki ring. Dilated.                           - 4 cm hiatal hernia. Recommendation:           - Patient has a contact number available for                            emergencies. The signs and symptoms of potential                            delayed complications were discussed with the                            patient. Return to normal activities tomorrow.                            Written discharge instructions were provided to the                            patient.                           - Post dilatation diet.                           - Continue present medications including Protonix                            40 mg p.o. daily.                           -  Await pathology results.                           - The findings and recommendations were discussed                            with the patient's family. Lynann Bologna, MD 09/28/2023 2:50:09 PM This report has been signed electronically.

## 2023-09-29 ENCOUNTER — Telehealth: Payer: Self-pay

## 2023-09-29 NOTE — Telephone Encounter (Signed)
  Follow up Call-     09/28/2023    1:38 PM  Call back number  Post procedure Call Back phone  # [phone]  Permission to leave phone message Yes     Patient questions:  Do you have a fever, pain , or abdominal swelling? No. Pain Score  0 *  Have you tolerated food without any problems? Yes.    Have you been able to return to your normal activities? Yes.    Do you have any questions about your discharge instructions: Diet   No. Medications  No. Follow up visit  No.  Do you have questions or concerns about your Care? No.  Actions: * If pain score is 4 or above: No action needed, pain <4.

## 2023-10-01 LAB — SURGICAL PATHOLOGY

## 2023-10-04 ENCOUNTER — Ambulatory Visit: Payer: 59 | Admitting: Family Medicine

## 2023-10-11 ENCOUNTER — Encounter: Payer: Self-pay | Admitting: Gastroenterology

## 2023-10-18 ENCOUNTER — Other Ambulatory Visit (HOSPITAL_COMMUNITY): Payer: Self-pay

## 2023-10-19 ENCOUNTER — Other Ambulatory Visit: Payer: Self-pay

## 2023-10-19 ENCOUNTER — Other Ambulatory Visit (HOSPITAL_COMMUNITY): Payer: Self-pay

## 2023-10-19 MED ORDER — VRAYLAR 1.5 MG PO CAPS
1.5000 mg | ORAL_CAPSULE | Freq: Every day | ORAL | 2 refills | Status: AC
  Filled 2023-10-19: qty 30, 30d supply, fill #0
  Filled 2024-01-13: qty 30, 30d supply, fill #1
  Filled 2024-02-17: qty 30, 30d supply, fill #2

## 2023-10-19 MED ORDER — ESCITALOPRAM OXALATE 20 MG PO TABS
20.0000 mg | ORAL_TABLET | Freq: Every day | ORAL | 2 refills | Status: AC
  Filled 2023-10-19: qty 30, 30d supply, fill #0
  Filled 2024-05-09 – 2024-05-11 (×2): qty 30, 30d supply, fill #1
  Filled 2024-06-19 (×2): qty 30, 30d supply, fill #2

## 2023-10-19 MED ORDER — OXCARBAZEPINE 150 MG PO TABS
150.0000 mg | ORAL_TABLET | Freq: Two times a day (BID) | ORAL | 2 refills | Status: AC
  Filled 2023-10-19: qty 60, 30d supply, fill #0
  Filled 2023-11-15: qty 60, 30d supply, fill #1
  Filled 2024-03-25: qty 60, 30d supply, fill #2

## 2023-10-21 ENCOUNTER — Encounter: Payer: Self-pay | Admitting: Family Medicine

## 2023-10-21 ENCOUNTER — Other Ambulatory Visit (HOSPITAL_COMMUNITY): Payer: Self-pay

## 2023-10-21 ENCOUNTER — Other Ambulatory Visit: Payer: Self-pay

## 2023-10-21 ENCOUNTER — Ambulatory Visit: Payer: 59 | Admitting: Family Medicine

## 2023-10-21 VITALS — BP 118/53 | HR 45 | Ht 63.0 in | Wt 202.4 lb

## 2023-10-21 DIAGNOSIS — K219 Gastro-esophageal reflux disease without esophagitis: Secondary | ICD-10-CM | POA: Diagnosis not present

## 2023-10-21 DIAGNOSIS — E039 Hypothyroidism, unspecified: Secondary | ICD-10-CM

## 2023-10-21 DIAGNOSIS — E2839 Other primary ovarian failure: Secondary | ICD-10-CM | POA: Diagnosis not present

## 2023-10-21 DIAGNOSIS — Z23 Encounter for immunization: Secondary | ICD-10-CM | POA: Diagnosis not present

## 2023-10-21 DIAGNOSIS — F411 Generalized anxiety disorder: Secondary | ICD-10-CM

## 2023-10-21 DIAGNOSIS — I50810 Right heart failure, unspecified: Secondary | ICD-10-CM | POA: Diagnosis not present

## 2023-10-21 DIAGNOSIS — I10 Essential (primary) hypertension: Secondary | ICD-10-CM

## 2023-10-21 DIAGNOSIS — J439 Emphysema, unspecified: Secondary | ICD-10-CM

## 2023-10-21 DIAGNOSIS — D649 Anemia, unspecified: Secondary | ICD-10-CM

## 2023-10-21 DIAGNOSIS — N1831 Chronic kidney disease, stage 3a: Secondary | ICD-10-CM

## 2023-10-21 MED ORDER — OMEPRAZOLE 40 MG PO CPDR
40.0000 mg | DELAYED_RELEASE_CAPSULE | Freq: Two times a day (BID) | ORAL | 3 refills | Status: DC
  Filled 2023-10-21: qty 180, 90d supply, fill #0
  Filled 2023-12-10 – 2023-12-28 (×2): qty 180, 90d supply, fill #1
  Filled 2024-04-14: qty 180, 90d supply, fill #2
  Filled 2024-07-20 – 2024-07-21 (×2): qty 180, 90d supply, fill #3

## 2023-10-21 MED ORDER — AMLODIPINE BESYLATE 5 MG PO TABS: 5.0000 mg | ORAL_TABLET | Freq: Every day | ORAL | Status: DC

## 2023-10-21 NOTE — Patient Instructions (Addendum)
It was great to see you!  Our plans for today:  - Take omeprazole instead of pantoprazole. Take this twice daily. Call GI for follow up appointment.  - Cut your amlodipine in half (5mg ). Keep an eye on your blood pressure. - come back in 1 month.   We are checking some labs today, we will release these results to your MyChart.  Take care and seek immediate care sooner if you develop any concerns.   Dr. Linwood Dibbles

## 2023-10-21 NOTE — Assessment & Plan Note (Signed)
Recheck labs 

## 2023-10-21 NOTE — Assessment & Plan Note (Signed)
Low normal today with orthostatic symptoms. Reduce amlodipine to 5mg . Check BP at home. F/u 1 month. Getting labs today.

## 2023-10-21 NOTE — Assessment & Plan Note (Signed)
Continue to follow with Psych. Med rec performed and updated in chart today.

## 2023-10-21 NOTE — Assessment & Plan Note (Signed)
Doing well on trelegy, continue.

## 2023-10-21 NOTE — Assessment & Plan Note (Addendum)
Recheck labs. Consider SGLT2 given known CHF.

## 2023-10-21 NOTE — Assessment & Plan Note (Signed)
Switch protonix to omeprazole. F/u 1 month.

## 2023-10-21 NOTE — Assessment & Plan Note (Addendum)
Euvolemic on exam today. On appropriate GDMT, consider addition of SGLT2 at follow up. Responding well to lasix, continue.

## 2023-10-21 NOTE — Progress Notes (Signed)
   SUBJECTIVE:   CHIEF COMPLAINT / HPI:   Hypertension, CHF, h/o CVA: - Medications: lipitor, amlodipine, lasix, losartan, metoprolol, potassium, ASA - Compliance: good - ECHO 2018 LVEF 60-65%. - Checking BP at home: occasionally, 110s SBP recently - Denies any SOB, CP, vision changes, medication SEs - some symptoms of hypotension, LE edema  GERD - s/p EGD with recent dilation. On protonix daily. Still with heartburn so taking OTC prilosec 2-3 times daily with relief.  Emphysema/COPD - Medications: trelegy - Compliance: good - only notices worsened breathing when anxiety is flaring up  Hypothyroidism - Medications: Synthroid . Reports compliance.  Prediabetes - recent A1c 5.7 on [facility] visit.  Anxiety - follows with Psych. Recently taken off seroquel, tegretol. On lexapro, recently started on trileptal. Starting on vraylar but hasn't gotten from pharmacy yet. Next psych appt in 1 month.    Also noting vaginal bump - will perform vaginal exam at next visit.  OBJECTIVE:   BP (!) 118/53   Pulse (!) 45   Ht 5\' 3"  (1.6 m)   Wt 202 lb 6.4 oz (91.8 kg)   SpO2 99%   BMI 35.85 kg/m   Gen: well appearing, in NAD Card: RRR Lungs: CTAB Ext: WWP, no edema   ASSESSMENT/PLAN:   Essential hypertension Low normal today with orthostatic symptoms. Reduce amlodipine to 5mg . Check BP at home. F/u 1 month. Getting labs today.  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HCC) Euvolemic on exam today. On appropriate GDMT, consider addition of SGLT2 at follow up. Responding well to lasix, continue.   COPD (chronic obstructive pulmonary disease) (HCC) Doing well on trelegy, continue.  GERD Switch protonix to omeprazole. F/u 1 month.   Hypothyroidism Recheck labs.  Stage 3a chronic kidney disease (CKD) (HCC) Recheck labs. Consider SGLT2 given known CHF.  Anxiety state Continue to follow with Psych. Med rec performed and updated in chart today.  Anemia Recheck labs.    1 month f/u for BP  recheck, GERD f/u and vaginal exam.   Caro Laroche, DO

## 2023-10-22 ENCOUNTER — Encounter: Payer: Self-pay | Admitting: Family Medicine

## 2023-10-22 LAB — CBC
Hematocrit: 36.4 % (ref 34.0–46.6)
Hemoglobin: 11.6 g/dL (ref 11.1–15.9)
MCH: 27.1 pg (ref 26.6–33.0)
MCHC: 31.9 g/dL (ref 31.5–35.7)
MCV: 85 fL (ref 79–97)
Platelets: 218 10*3/uL (ref 150–450)
RBC: 4.28 x10E6/uL (ref 3.77–5.28)
RDW: 12.7 % (ref 11.7–15.4)
WBC: 9.4 10*3/uL (ref 3.4–10.8)

## 2023-10-22 LAB — IRON,TIBC AND FERRITIN PANEL
Ferritin: 41 ng/mL (ref 15–150)
Iron Saturation: 31 % (ref 15–55)
Iron: 89 ug/dL (ref 27–139)
Total Iron Binding Capacity: 286 ug/dL (ref 250–450)
UIBC: 197 ug/dL (ref 118–369)

## 2023-10-22 LAB — BASIC METABOLIC PANEL
BUN/Creatinine Ratio: 15 (ref 12–28)
BUN: 18 mg/dL (ref 8–27)
CO2: 24 mmol/L (ref 20–29)
Calcium: 9 mg/dL (ref 8.7–10.3)
Chloride: 101 mmol/L (ref 96–106)
Creatinine, Ser: 1.2 mg/dL — ABNORMAL HIGH (ref 0.57–1.00)
Glucose: 101 mg/dL — ABNORMAL HIGH (ref 70–99)
Potassium: 4.5 mmol/L (ref 3.5–5.2)
Sodium: 139 mmol/L (ref 134–144)
eGFR: 47 mL/min/{1.73_m2} — ABNORMAL LOW (ref >59)

## 2023-10-22 LAB — TSH: TSH: 5.25 u[IU]/mL — ABNORMAL HIGH (ref 0.450–4.500)

## 2023-10-25 ENCOUNTER — Other Ambulatory Visit (HOSPITAL_COMMUNITY): Payer: Self-pay

## 2023-10-25 ENCOUNTER — Telehealth: Payer: Self-pay

## 2023-10-25 NOTE — Telephone Encounter (Signed)
Received call from patient's daughter in law, Melissa regarding results from last week's office visit.   DPR on file for Melissa.   Provided with results per note from Dr. Linwood Dibbles.   No additional questions at this time.   Veronda Prude, RN

## 2023-10-27 ENCOUNTER — Other Ambulatory Visit: Payer: Self-pay

## 2023-10-27 ENCOUNTER — Other Ambulatory Visit (HOSPITAL_COMMUNITY): Payer: Self-pay

## 2023-11-01 ENCOUNTER — Other Ambulatory Visit (HOSPITAL_COMMUNITY): Payer: Self-pay

## 2023-11-01 ENCOUNTER — Other Ambulatory Visit: Payer: Self-pay

## 2023-11-03 ENCOUNTER — Other Ambulatory Visit (HOSPITAL_COMMUNITY): Payer: Self-pay

## 2023-11-03 ENCOUNTER — Other Ambulatory Visit: Payer: Self-pay

## 2023-11-07 ENCOUNTER — Other Ambulatory Visit: Payer: Self-pay | Admitting: Family Medicine

## 2023-11-07 DIAGNOSIS — E039 Hypothyroidism, unspecified: Secondary | ICD-10-CM

## 2023-11-08 ENCOUNTER — Other Ambulatory Visit: Payer: Self-pay

## 2023-11-08 ENCOUNTER — Other Ambulatory Visit (HOSPITAL_COMMUNITY): Payer: Self-pay

## 2023-11-15 ENCOUNTER — Other Ambulatory Visit (HOSPITAL_COMMUNITY): Payer: Self-pay

## 2023-11-18 ENCOUNTER — Other Ambulatory Visit (HOSPITAL_COMMUNITY): Payer: Self-pay

## 2023-11-18 ENCOUNTER — Other Ambulatory Visit: Payer: Self-pay

## 2023-11-18 MED ORDER — ESCITALOPRAM OXALATE 20 MG PO TABS
20.0000 mg | ORAL_TABLET | Freq: Every day | ORAL | 0 refills | Status: DC
  Filled 2023-11-18: qty 30, 30d supply, fill #0

## 2023-11-18 MED ORDER — VRAYLAR 1.5 MG PO CAPS
1.5000 mg | ORAL_CAPSULE | Freq: Every day | ORAL | 0 refills | Status: DC
  Filled 2023-11-18: qty 30, 30d supply, fill #0

## 2023-11-18 MED ORDER — OXCARBAZEPINE 150 MG PO TABS
150.0000 mg | ORAL_TABLET | Freq: Two times a day (BID) | ORAL | 0 refills | Status: DC
  Filled 2023-11-18: qty 60, 30d supply, fill #0

## 2023-11-18 MED ORDER — TRAZODONE HCL 50 MG PO TABS
50.0000 mg | ORAL_TABLET | Freq: Every day | ORAL | 0 refills | Status: DC
  Filled 2023-11-18: qty 30, 30d supply, fill #0

## 2023-11-19 ENCOUNTER — Ambulatory Visit (INDEPENDENT_AMBULATORY_CARE_PROVIDER_SITE_OTHER): Payer: 59 | Admitting: Family Medicine

## 2023-11-19 ENCOUNTER — Other Ambulatory Visit: Payer: Self-pay

## 2023-11-19 ENCOUNTER — Other Ambulatory Visit (HOSPITAL_COMMUNITY): Payer: Self-pay

## 2023-11-19 ENCOUNTER — Encounter: Payer: Self-pay | Admitting: Family Medicine

## 2023-11-19 VITALS — BP 114/66 | HR 45 | Ht 63.0 in | Wt 202.2 lb

## 2023-11-19 DIAGNOSIS — I10 Essential (primary) hypertension: Secondary | ICD-10-CM | POA: Diagnosis not present

## 2023-11-19 DIAGNOSIS — K219 Gastro-esophageal reflux disease without esophagitis: Secondary | ICD-10-CM

## 2023-11-19 DIAGNOSIS — N898 Other specified noninflammatory disorders of vagina: Secondary | ICD-10-CM | POA: Insufficient documentation

## 2023-11-19 DIAGNOSIS — I50810 Right heart failure, unspecified: Secondary | ICD-10-CM

## 2023-11-19 DIAGNOSIS — K5900 Constipation, unspecified: Secondary | ICD-10-CM

## 2023-11-19 DIAGNOSIS — E039 Hypothyroidism, unspecified: Secondary | ICD-10-CM | POA: Diagnosis not present

## 2023-11-19 MED ORDER — METOPROLOL SUCCINATE ER 25 MG PO TB24
25.0000 mg | ORAL_TABLET | Freq: Every day | ORAL | 3 refills | Status: DC
  Filled 2023-11-19 (×2): qty 90, 90d supply, fill #0

## 2023-11-19 MED ORDER — POLYETHYLENE GLYCOL 3350 17 GM/SCOOP PO POWD
ORAL | 1 refills | Status: AC
  Filled 2023-11-19: qty 476, 14d supply, fill #0

## 2023-11-19 NOTE — Assessment & Plan Note (Signed)
Doing well s/p dilation, remains on PPI. Continue to follow with GI

## 2023-11-19 NOTE — Assessment & Plan Note (Signed)
TSH elevated last check, recheck today.

## 2023-11-19 NOTE — Progress Notes (Signed)
   SUBJECTIVE:   CHIEF COMPLAINT / HPI:   Hypertension, HFpEF: - last ECHO 2018 LVEF 60-65%, mild LVH, mild mitral regurgitation - Medications: metoprolol, lasix, amlodipine, losartan, atorvastatin, ASA - Compliance: good - has noticed decreased heart rate recently at home. No palpitations or abnormal heart rhythms.  GERD - with h/o esophagitis and gastritis, s/p dilation 2015. - EGD 07/2023 with 4cm hiatal hernia and Schatzki ring, dilated. Doing well since. Still on PPI.  Vaginal bump - noticing painful vaginal bump recently. Hurts with sitting or pushing on it. No discharge from bump. No vaginal discharge or bleeding. Has tried to poke it with needle but nothing comes out.   Hypothyroidism - last TSH above goal, no changes in meds given previously stable. Planned to recheck today.   OBJECTIVE:   BP 114/66   Pulse (!) 45   Ht 5\' 3"  (1.6 m)   Wt 202 lb 3.2 oz (91.7 kg)   SpO2 99%   BMI 35.82 kg/m   Gen: well appearing, in NAD Card: reg rhythm, bradycardic. Lungs: CTAB GYN:  External genitalia within normal limits. Few small firm, nonmobile flesh-colored nodules to edge of R labia majora, no fluctuance, erythema. Ext: WWP, no edema   ASSESSMENT/PLAN:   Essential hypertension Remains low normal. Reducing metoprolol given bradycardia. F/u 1 month.  GERD Doing well s/p dilation, remains on PPI. Continue to follow with GI  Hypothyroidism TSH elevated last check, recheck today.  Vaginal lesion Appears most consistent with dermatofibroma though not classic location. No features consistent with malignancy, cyst, abscess. Given painful, recommend excision. Offered GYN referral given location but prefers to be taken out in house if possible. Will message GYN/Derm faculty to see if feasible.      Caro Laroche, DO

## 2023-11-19 NOTE — Assessment & Plan Note (Signed)
Remains low normal. Reducing metoprolol given bradycardia. F/u 1 month.

## 2023-11-19 NOTE — Assessment & Plan Note (Signed)
Appears most consistent with dermatofibroma though not classic location. No features consistent with malignancy, cyst, abscess. Given painful, recommend excision. Offered GYN referral given location but prefers to be taken out in house if possible. Will message GYN/Derm faculty to see if feasible.

## 2023-11-19 NOTE — Patient Instructions (Signed)
It was great to see you!  Our plans for today:  - We decreased your metoprolol today. Take the new prescription (daily dosing instead of twice daily. Do not take the tartrate.) - I will check with our gynecology faculty to see if you vaginal bump is something we can remove here or if we need to refer you for this.   We are checking some labs today, we will release these results to your MyChart.  Take care and seek immediate care sooner if you develop any concerns.   Dr. Linwood Dibbles

## 2023-11-20 LAB — TSH: TSH: 3.78 u[IU]/mL (ref 0.450–4.500)

## 2023-11-21 ENCOUNTER — Other Ambulatory Visit: Payer: Self-pay | Admitting: Family Medicine

## 2023-11-21 DIAGNOSIS — F411 Generalized anxiety disorder: Secondary | ICD-10-CM

## 2023-11-22 ENCOUNTER — Encounter: Payer: Self-pay | Admitting: Family Medicine

## 2023-11-22 ENCOUNTER — Other Ambulatory Visit (HOSPITAL_COMMUNITY): Payer: Self-pay

## 2023-11-30 ENCOUNTER — Telehealth: Payer: Self-pay | Admitting: Student

## 2023-11-30 NOTE — Telephone Encounter (Signed)
**  After Hours/ Emergency Line Call**  Received a call to report from Melissa Hodgman that her mother Erica Morris has had issues with blood pressure.  Endorsing blood pressures of diastolic of 40s. Took BP on phone and states it is 154/88 (reassuring) and pulse of 43. Some dizziness and tiredness and staying in bed. States that she has changed metoprolol  per Dr. Roark last note and taking succinate 25 mg nightly. She wants clarification on whether amlodipine  should still be given. States HR has been in the 30s yesterday -- was 45 in clinic 12/20. I discussed amlodipine  should not impact heart rate.  I did discuss I will send a message to PCP Dr. Rumball and whether she would like to adjust her metoprolol  dosing down again given significant bradycardia.  Red flags discussed.  Will forward to PCP.  Wendel Lesch, MD PGY-3, [facility]

## 2023-12-02 ENCOUNTER — Telehealth: Payer: Self-pay | Admitting: Student

## 2023-12-02 NOTE — Telephone Encounter (Signed)
**  After Hours/ Emergency Line Call**  Received a call to report that Erminio KANDICE Rakers continues to experience HR in the 40s with symptoms of dizziness and fatigue. Had Toprol  XL decreased to 25 mg at last visit with PCP.  Denying chest pain. Patient did call after hours line 11/30/23 for similar complaint. Patient has already takne metop and norvasc  tonight. Daughter is inquiring about next steps as she has yet to be contacted by PCP regarding this. Recommended that they monitor HR and pressure intermittently throughout the night.  Red flags discussed, be seen if HR drops below 40 or if symptoms worsen, rest and remain hydrated over course of the night.  Anticipate that she needs another decrease in bb given inability to tolerate. Instructed to not take Metoprolol  dose tomorrow (takes at bedtime) until contacted by provider to give guidance on dosage change. Will forward again to PCP for assistance.  Laurier Hugger, MD  PGY-3, [facility] 12/02/2023 8:13 PM

## 2023-12-03 NOTE — Telephone Encounter (Signed)
 Covering for Dr. Madelon  RN team, can you contact daughter and ask her to decrease patient's metoprolol  to 12.5mg  daily? She can cut the 25mg  tablets in half.  I would also recommend she be seen in person, likely needs EKG to confirm this is sinus bradycardia and not a type of heart block.  Please let me know if there are further questions  Thanks Laymon JINNY Legions, MD

## 2023-12-03 NOTE — Telephone Encounter (Signed)
 Returned call to patient's daughter in law, Holt. She did not answer, LVM asking that she return call to office to discuss next steps.   Veronda Prude, RN

## 2023-12-06 ENCOUNTER — Other Ambulatory Visit (HOSPITAL_COMMUNITY): Payer: Self-pay

## 2023-12-09 ENCOUNTER — Ambulatory Visit: Payer: 59

## 2023-12-10 ENCOUNTER — Other Ambulatory Visit (HOSPITAL_COMMUNITY): Payer: Self-pay

## 2023-12-14 ENCOUNTER — Other Ambulatory Visit (HOSPITAL_COMMUNITY): Payer: Self-pay

## 2023-12-14 ENCOUNTER — Other Ambulatory Visit: Payer: Self-pay

## 2023-12-16 ENCOUNTER — Other Ambulatory Visit (HOSPITAL_COMMUNITY): Payer: Self-pay

## 2023-12-16 ENCOUNTER — Other Ambulatory Visit: Payer: Self-pay

## 2023-12-16 MED ORDER — ESCITALOPRAM OXALATE 20 MG PO TABS
20.0000 mg | ORAL_TABLET | Freq: Every day | ORAL | 0 refills | Status: DC
  Filled 2023-12-16: qty 60, 60d supply, fill #0

## 2023-12-16 MED ORDER — TRAZODONE HCL 50 MG PO TABS
50.0000 mg | ORAL_TABLET | Freq: Every day | ORAL | 0 refills | Status: AC
  Filled 2023-12-16: qty 60, 60d supply, fill #0

## 2023-12-16 MED ORDER — VRAYLAR 1.5 MG PO CAPS
1.5000 mg | ORAL_CAPSULE | Freq: Every day | ORAL | 0 refills | Status: AC
  Filled 2023-12-16: qty 30, 30d supply, fill #0
  Filled 2024-03-13: qty 30, 30d supply, fill #1

## 2023-12-16 MED ORDER — OXCARBAZEPINE 150 MG PO TABS
150.0000 mg | ORAL_TABLET | Freq: Two times a day (BID) | ORAL | 0 refills | Status: AC
  Filled 2023-12-16: qty 120, 60d supply, fill #0

## 2023-12-17 ENCOUNTER — Other Ambulatory Visit: Payer: Self-pay | Admitting: Family Medicine

## 2023-12-17 ENCOUNTER — Encounter: Payer: Self-pay | Admitting: Family Medicine

## 2023-12-17 ENCOUNTER — Ambulatory Visit (INDEPENDENT_AMBULATORY_CARE_PROVIDER_SITE_OTHER): Payer: 59 | Admitting: Family Medicine

## 2023-12-17 MED ORDER — METOPROLOL SUCCINATE ER 25 MG PO TB24: 12.5000 mg | ORAL_TABLET | Freq: Every day | ORAL | Status: DC

## 2023-12-17 NOTE — Progress Notes (Signed)
Visit for HTN and HR follow up after decreasing metoprolol at last visit and after hours call.  Visit not conducted. Patient left prior to being seen by provider. Vitals reassuring. Called patient but no answer, left voicemail. Will route to admin team to reschedule.  BP 127/81   Pulse 89   Ht 5\' 3"  (1.6 m)   Wt 200 lb 9.6 oz (91 kg)   SpO2 99%   BMI 35.53 kg/m

## 2023-12-18 ENCOUNTER — Other Ambulatory Visit: Payer: Self-pay | Admitting: Family Medicine

## 2023-12-18 ENCOUNTER — Other Ambulatory Visit (HOSPITAL_COMMUNITY): Payer: Self-pay

## 2023-12-22 ENCOUNTER — Other Ambulatory Visit (HOSPITAL_COMMUNITY): Payer: Self-pay

## 2023-12-30 ENCOUNTER — Other Ambulatory Visit: Payer: Self-pay

## 2023-12-30 ENCOUNTER — Other Ambulatory Visit (HOSPITAL_COMMUNITY): Payer: Self-pay

## 2023-12-30 ENCOUNTER — Other Ambulatory Visit: Payer: Self-pay | Admitting: Family Medicine

## 2023-12-30 ENCOUNTER — Ambulatory Visit: Payer: 59 | Admitting: Family Medicine

## 2023-12-30 VITALS — BP 133/43 | HR 51 | Wt 203.6 lb

## 2023-12-30 DIAGNOSIS — R001 Bradycardia, unspecified: Secondary | ICD-10-CM

## 2023-12-30 DIAGNOSIS — N907 Vulvar cyst: Secondary | ICD-10-CM

## 2023-12-30 DIAGNOSIS — E039 Hypothyroidism, unspecified: Secondary | ICD-10-CM

## 2023-12-30 MED ORDER — TRELEGY ELLIPTA 100-62.5-25 MCG/ACT IN AEPB
1.0000 | INHALATION_SPRAY | Freq: Every day | RESPIRATORY_TRACT | 6 refills | Status: DC
  Filled 2023-12-30: qty 60, 30d supply, fill #0
  Filled 2024-02-29: qty 60, 30d supply, fill #1
  Filled 2024-04-25: qty 60, 30d supply, fill #2
  Filled 2024-06-05: qty 60, 30d supply, fill #3

## 2023-12-30 MED ORDER — LEVOTHYROXINE SODIUM 50 MCG PO TABS
50.0000 ug | ORAL_TABLET | Freq: Every day | ORAL | 0 refills | Status: DC
  Filled 2023-12-30: qty 90, 90d supply, fill #0

## 2023-12-30 NOTE — Progress Notes (Signed)
     SUBJECTIVE:   CHIEF COMPLAINT / HPI:   Vulvar bump Seen by PCP on 11/19/2023 and thought to have lesion concerning for dermatofibroma.  Referred to GYN clinic for possible excision.  Today patient reports bump has been there for "months" but unsure of exact time.  Does not think it is increasing in size and may actually be getting smaller.  Occasionally painful as it rubs on the labial folds.  No drainage from the affected area.  Denies vaginal discharge or bleeding.  No similar spots on her body.  PERTINENT  PMH / PSH: HTN, HFpEF, GERD, hypothyroidism  OBJECTIVE:   BP (!) 133/43   Pulse (!) 51   Wt 203 lb 9.6 oz (92.4 kg)   SpO2 99%   BMI 36.07 kg/m    General: NAD, pleasant, able to participate in exam Skin: ~9mm firm, mobile nodule on the right superior, anterior labial fold.  No surrounding erythema or drainage.  No other discernible nodules palpated.  ASSESSMENT/PLAN:   Assessment & Plan Vulvar cyst Difficult to find upon exam, most consistent with small cyst.  Mobile enough to bring to the labial surface but given location would likely be more painful to excise given friction.  No concerning features for malignancy or infection therefore recommended continued surveillance.  Follow-up with PCP in 2 to 4 weeks for monitoring. Can be considered for excision if significantly increasing in size in Derm clinic.   Dr. Elberta Fortis, DO [place] [facility]

## 2023-12-30 NOTE — Progress Notes (Deleted)
     SUBJECTIVE:   CHIEF COMPLAINT / HPI:   Vulvar bump Seen by PCP on 11/19/2023 and thought to have lesion concerning for dermatofibroma.  Referred to GYN clinic for possible excision.  Today patient reports bump has been there for "months" but unsure of exact time.  Does not think it is increasing in size and may actually be getting smaller.  Occasionally painful as it rubs on the labial folds.  No drainage from the affected area.  Denies vaginal discharge or bleeding.  No similar spots on her body.  PERTINENT  PMH / PSH: HTN, HFpEF, GERD, hypothyroidism  OBJECTIVE:   BP (!) 133/43   Pulse (!) 51   Wt 203 lb 9.6 oz (92.4 kg)   SpO2 99%   BMI 36.07 kg/m    General: NAD, pleasant, able to participate in exam Skin: ~41mm firm, mobile nodule on the right superior, anterior labial fold.  No surrounding erythema or drainage.  No other discernible nodules palpated.  ASSESSMENT/PLAN:   Assessment & Plan Vulvar cyst Difficult to find upon exam, most consistent with small cyst.  Mobile enough to bring to the labial surface but given location would likely be more painful to excise given friction.  No concerning features for malignancy or infection therefore recommended continued surveillance.  Follow-up with PCP in 2 to 4 weeks for monitoring. Can be considered for excision if significantly increasing in size in Derm clinic.   Dr. Elberta Fortis, DO [place] [facility]    {    This will disappear when note is signed, click to select method of visit    :1}

## 2023-12-30 NOTE — Patient Instructions (Addendum)
It was nice seeing you today. We found one of the very tiny cyst on your vulva. This is benign and does not look like cancer. However, let us keep a lose watch. See PCP in 2-4 weeks for this. If it gets bigger or change, you can return to the dermatology clinic or Gyn clinic for surgical removal.  Your heart rate is a bit low due to your medications (Metoprolol). However, you need this medication for your heart. Since you are having no symptoms related to this, we will have you continue Metoprolol half tablet as instructed by your PCP.   You have a follow up appointment with Rumball next Monday.

## 2024-01-03 ENCOUNTER — Encounter: Payer: Self-pay | Admitting: Family Medicine

## 2024-01-03 ENCOUNTER — Other Ambulatory Visit (HOSPITAL_COMMUNITY): Payer: Self-pay

## 2024-01-03 ENCOUNTER — Other Ambulatory Visit: Payer: Self-pay

## 2024-01-03 ENCOUNTER — Ambulatory Visit (INDEPENDENT_AMBULATORY_CARE_PROVIDER_SITE_OTHER): Payer: 59 | Admitting: Family Medicine

## 2024-01-03 VITALS — BP 140/49 | HR 48 | Wt 204.6 lb

## 2024-01-03 DIAGNOSIS — I10 Essential (primary) hypertension: Secondary | ICD-10-CM

## 2024-01-03 DIAGNOSIS — R001 Bradycardia, unspecified: Secondary | ICD-10-CM | POA: Diagnosis not present

## 2024-01-03 MED ORDER — ATORVASTATIN CALCIUM 80 MG PO TABS
80.0000 mg | ORAL_TABLET | Freq: Every day | ORAL | 3 refills | Status: DC
  Filled 2024-01-03: qty 90, 90d supply, fill #0
  Filled 2024-03-31: qty 90, 90d supply, fill #1

## 2024-01-03 MED ORDER — METOPROLOL SUCCINATE ER 25 MG PO TB24
12.5000 mg | ORAL_TABLET | Freq: Every day | ORAL | 1 refills | Status: DC
  Filled 2024-01-03: qty 45, 90d supply, fill #0
  Filled 2024-04-14: qty 45, 90d supply, fill #1

## 2024-01-03 NOTE — Patient Instructions (Signed)
It was great to see you!  Our plans for today:  - Wear well supported shoes when you wash dishes. You can try a memory foam mat near your sink. - No changes to your medications.  - Get your shingles vaccine from your pharmacy.   Take care and seek immediate care sooner if you develop any concerns.   Dr. Linwood Dibbles

## 2024-01-03 NOTE — Progress Notes (Signed)
   SUBJECTIVE:   CHIEF COMPLAINT / HPI:   Hypertension, HFpEF, Bradycardia: - last ECHO 2018 LVEF 60-65%, mild LVH, mild mitral regurgitation  - Medications: metoprolol 12.5 daily, amlodipine, losartan, lasix, atorvastatin, ASA - metoprolol previously decreased due to symptomatic bradycardia. - Compliance: good - Denies any SOB, CP, vision changes, LE edema, medication SEs, or symptoms of hypotension. No palpitations.  Back pain - notices lower back pain when she stands for longer than 10 minutes while washing dishes. No numbness, tingling. Does not wear shoes or stand on cushioned mat. Sits down and pain resolves.  HM - due for shingles vaccine   OBJECTIVE:   BP (!) 140/49   Pulse (!) 48   Wt 204 lb 9.6 oz (92.8 kg)   SpO2 98%   BMI 36.24 kg/m   Gen: well appearing, in NAD Card: Reg rhythm, bradycardic. Lungs: CTAB Ext: WWP, no edema   ASSESSMENT/PLAN:   Essential hypertension Doing well on current regimen, no changes made today.  Bradycardia Typically runs in 50s per chart review. Asymptomatic and requiring BB for CHF, no changes.    Back pain - likely MSK 2/2 posture/support while washing dishes. Recommend supportive shoes and cushioned mat.   Caro Laroche, DO

## 2024-01-03 NOTE — Assessment & Plan Note (Signed)
Typically runs in 50s per chart review. Asymptomatic and requiring BB for CHF, no changes.

## 2024-01-03 NOTE — Assessment & Plan Note (Signed)
Doing well on current regimen, no changes made today. 

## 2024-01-05 ENCOUNTER — Other Ambulatory Visit (HOSPITAL_COMMUNITY): Payer: Self-pay

## 2024-01-13 ENCOUNTER — Other Ambulatory Visit (HOSPITAL_COMMUNITY): Payer: Self-pay

## 2024-01-21 ENCOUNTER — Ambulatory Visit (INDEPENDENT_AMBULATORY_CARE_PROVIDER_SITE_OTHER): Payer: 59 | Admitting: Student

## 2024-01-21 VITALS — BP 134/56 | HR 50 | Wt 208.0 lb

## 2024-01-21 DIAGNOSIS — R251 Tremor, unspecified: Secondary | ICD-10-CM | POA: Diagnosis not present

## 2024-01-21 DIAGNOSIS — W19XXXA Unspecified fall, initial encounter: Secondary | ICD-10-CM

## 2024-01-21 NOTE — Progress Notes (Signed)
    SUBJECTIVE:   CHIEF COMPLAINT / HPI:   Tongue  Tremor Here with tongue fasciculations for the last 3 years that seem to be getting progressively worse.  Has not discussed this with any providers in the past but brings it to our attention today due to the fact that she is now having some issues with involuntary drooling out of the side of her mouth.  Of note, she is on Vraylar that is managed by her psychiatrist.  This was a new medication and November 2024 that replaced her previous quetiapine. She has not noticed any issue with resting or intention tremors of her hands, though does have a chronic tremor of her right leg.  Fall Had a fall 3 days ago while walking on large gravel.  Sounds like a piece of gravel rolled out from under her.  She fell and landed on her left hip.  Has a "knot" and some bruising but no difficulty walking or weakness to the extremity.   PERTINENT  PMH / PSH: Chronic Bradycardia; CHF with recovered EF; GERD; hypothyroidism; COPD; Prior CVA  OBJECTIVE:   BP (!) 134/56   Pulse (!) 50   Wt 208 lb (94.3 kg)   SpO2 97%   BMI 36.85 kg/m   Gen: Appears older than stated age, NAD HENT: Resting tremor of the jaw and tongue. MMM.  Cardio: Bradycardic. Regular.  Pulm: Lungs clear throughout  Ext: Resting tremor of the RLE. Fine tremor to the BUE, no intention tremor.  Neuro: Tremors as above. Gait is appropriate for age. No obvious rigidity or bradykinesia.  Tenderness over the greater trochanter of the left hip without obvious deformity.  Mild bruising.  ASSESSMENT/PLAN:   Assessment & Plan Tremor of tongue Clinical picture with tremor of the tongue, jaw, lower extremity is concerning for developing parkinsonism.  Must consider the possibility that this is drug related given that she is on Vraylar, though based on the history that she gives it sounds like symptoms preceded the initiation of Vraylar.  Other considerations would be under treated hypothyroidism or  vitamin/mineral deficiency. -MRI brain -TSH, BMP, magnesium, B12 -Will message her psychiatrist to see if a trial of a different medication might be in order.  Unfortunately, she has already trialed Seroquel which would be the lowest risk alternative. -Follow-up in 2 weeks, will need to get neurology involved if the above workup is unremarkable or she does not improve with transitioning away from Vraylar. Fall, initial encounter Sounds like she tripped over gravel.  She is certainly at increased risk for falls with her developing ?parkinsonism. -Ambulatory referral to physical therapy   J Dorothyann Gibbs, MD [facility]

## 2024-01-21 NOTE — Patient Instructions (Addendum)
Ms. Hartmann,  Let's do a few things:  I am worried you are developing Parkinsonism. This can be caused by medications or true parkinson's disease. I will review your meds with our pharmacy team to see if any of them could be the cause. Let's get an MRI of your brain. [facility] will call you to schedule this.   Come back to see me or Dr. Linwood Dibbles in 2 weeks.   I am also going to get you plugged in with physical therapy to reduce the risk of future falls.   Eliezer Mccoy, MD

## 2024-01-22 LAB — BASIC METABOLIC PANEL
BUN/Creatinine Ratio: 18 (ref 12–28)
BUN: 23 mg/dL (ref 8–27)
CO2: 24 mmol/L (ref 20–29)
Calcium: 8.9 mg/dL (ref 8.7–10.3)
Chloride: 101 mmol/L (ref 96–106)
Creatinine, Ser: 1.27 mg/dL — ABNORMAL HIGH (ref 0.57–1.00)
Glucose: 93 mg/dL (ref 70–99)
Potassium: 4.7 mmol/L (ref 3.5–5.2)
Sodium: 140 mmol/L (ref 134–144)
eGFR: 44 mL/min/{1.73_m2} — ABNORMAL LOW (ref >59)

## 2024-01-22 LAB — TSH: TSH: 4.54 u[IU]/mL — ABNORMAL HIGH (ref 0.450–4.500)

## 2024-01-22 LAB — MAGNESIUM: Magnesium: 2.1 mg/dL (ref 1.6–2.3)

## 2024-01-22 LAB — VITAMIN B12: Vitamin B-12: 340 pg/mL (ref 232–1245)

## 2024-01-25 ENCOUNTER — Telehealth: Payer: Self-pay

## 2024-01-25 NOTE — Telephone Encounter (Signed)
 Patient did not answer, LVM to all numbers on file. Penni Bombard CMA

## 2024-01-25 NOTE — Telephone Encounter (Signed)
-----   Message from [facility] Jasmine December S sent at 01/24/2024 12:02 PM EST ----- Ok to schedule MRI at [facility].  Thanks, Melvenia Beam

## 2024-01-26 ENCOUNTER — Ambulatory Visit (HOSPITAL_COMMUNITY): Payer: 59

## 2024-01-27 ENCOUNTER — Encounter: Payer: Self-pay | Admitting: Student

## 2024-01-31 ENCOUNTER — Ambulatory Visit (HOSPITAL_COMMUNITY): Admission: RE | Admit: 2024-01-31 | Payer: 59 | Source: Ambulatory Visit

## 2024-02-04 ENCOUNTER — Encounter: Payer: Self-pay | Admitting: Family Medicine

## 2024-02-04 ENCOUNTER — Other Ambulatory Visit: Payer: Self-pay

## 2024-02-04 ENCOUNTER — Ambulatory Visit: Payer: 59 | Admitting: Family Medicine

## 2024-02-04 ENCOUNTER — Other Ambulatory Visit (HOSPITAL_COMMUNITY): Payer: Self-pay

## 2024-02-04 VITALS — BP 138/71 | HR 48 | Ht 63.0 in | Wt 209.6 lb

## 2024-02-04 DIAGNOSIS — G255 Other chorea: Secondary | ICD-10-CM

## 2024-02-04 DIAGNOSIS — Z5982 Transportation insecurity: Secondary | ICD-10-CM | POA: Diagnosis not present

## 2024-02-04 DIAGNOSIS — H6690 Otitis media, unspecified, unspecified ear: Secondary | ICD-10-CM | POA: Diagnosis not present

## 2024-02-04 MED ORDER — AMOXICILLIN-POT CLAVULANATE 875-125 MG PO TABS
1.0000 | ORAL_TABLET | Freq: Two times a day (BID) | ORAL | 0 refills | Status: AC
Start: 2024-02-04 — End: 2024-02-09
  Filled 2024-02-04: qty 10, 5d supply, fill #0

## 2024-02-04 NOTE — Assessment & Plan Note (Signed)
 Of mouth. Per chart review, also evaluated in 2021. With longstanding antipsychotic use, unclear underlying Psychiatric diagnosis. Awaiting brain MRI. Recommend discussing with Psychiatry, call for follow up appt.

## 2024-02-04 NOTE — Progress Notes (Signed)
   SUBJECTIVE:   CHIEF COMPLAINT / HPI:   Tremor - Still with tremor of tongue, having occasional drooling. Currently on vraylar, trileptal with seemingly longstanding history of antipsychotic use. Unsure last time she saw Psych, had appt in December. Sees [facility]. Has trouble with virtual appointments as doesn't know how to work virtual chart and doesn't have internet. Also with transportation issues, has to pay for [facility] for doctor visits. Has upcoming MRI next Friday. Got medicine for tremors in the past, doesn't know what it was.   URI - 2 weeks. Nonproductive cough, dizziness, congestion. No fever. Some SOB with laying flat but no more than usual. Compliant with trelegy. Uses albuterol if feels can't breathe at night.  SDOH - living with daughter. About to lose their housing. Does not drive and has to depend on [employer] for transportation but only when she has money in the bank. Gets meds delivered to her home.   OBJECTIVE:   BP 138/71   Pulse (!) 48   Ht 5\' 3"  (1.6 m)   Wt 209 lb 9.6 oz (95.1 kg)   SpO2 99%   BMI 37.13 kg/m   Gen: well appearing, in NAD HEENT: orophyarynx clear without exudate or erythema. Uvula midline. No tonsillar enlargement. Good dentition. TM visible b/l, bulging of R side with slight effusion. No cervical or supraclavicular lymphadenopathy. TTP over R sided frontal sinus. Repetitive chewing motion of mouth at rest, not apparent with talking. Card: RRR Lungs: CTAB Ext: WWP, no edema   ASSESSMENT/PLAN:   Muscle, jerky movements (uncontrolled) Of mouth. Per chart review, also evaluated in 2021. With longstanding antipsychotic use, unclear underlying Psychiatric diagnosis. Awaiting brain MRI. Recommend discussing with Psychiatry, call for follow up appt.   URI - persistent x2 weeks and now with R sided frontal sinus tenderness and evidence of R sided AOM, will treat with abx.  SDOH - with housing, transportation, financial and food  insecurities  Caro Laroche, DO

## 2024-02-04 NOTE — Patient Instructions (Addendum)
 It was great to see you!  Our plans for today:  - Call [facility] for an appointment. [address], [address] [place] [phone] - A social worker will call you to help with housing, food, transportation.  - We will follow up your MRI results.  - Take the antibiotic for 5 days. Let me know if you don't get better.  Take care and seek immediate care sooner if you develop any concerns.   Dr. Linwood Dibbles

## 2024-02-05 ENCOUNTER — Other Ambulatory Visit (HOSPITAL_COMMUNITY): Payer: Self-pay

## 2024-02-07 ENCOUNTER — Other Ambulatory Visit (HOSPITAL_COMMUNITY): Payer: Self-pay

## 2024-02-07 ENCOUNTER — Telehealth: Payer: Self-pay | Admitting: *Deleted

## 2024-02-07 NOTE — Progress Notes (Signed)
 Complex Care Management Note  Care Guide Note 02/07/2024 Name: Erica Morris MRN: [id number] DOB: [birth year]  Erica Morris is a 78 y.o. year old female who sees Caro Laroche, DO for primary care. I reached out to Erica Morris by phone today to offer complex care management services.  Erica Morris was given information about Complex Care Management services today including:   The Complex Care Management services include support from the care team which includes your Nurse Care Manager, Clinical Social Worker, or Pharmacist.  The Complex Care Management team is here to help remove barriers to the health concerns and goals most important to you. Complex Care Management services are voluntary, and the patient may decline or stop services at any time by request to their care team member.   Complex Care Management Consent Status: Patient agreed to services and verbal consent obtained.   Follow up plan:  Telephone appointment with complex care management team member scheduled for:  3/21  Encounter Outcome:  Patient Scheduled  Gwenevere Ghazi  [facility]  [facility], [facility] Guide  Direct Dial: [phone]  Fax [phone]

## 2024-02-09 ENCOUNTER — Other Ambulatory Visit (HOSPITAL_COMMUNITY): Payer: Self-pay

## 2024-02-09 ENCOUNTER — Other Ambulatory Visit: Payer: Self-pay | Admitting: Family Medicine

## 2024-02-09 ENCOUNTER — Other Ambulatory Visit: Payer: Self-pay

## 2024-02-09 MED ORDER — AMLODIPINE BESYLATE 5 MG PO TABS
5.0000 mg | ORAL_TABLET | Freq: Every day | ORAL | 3 refills | Status: DC
  Filled 2024-02-09: qty 90, 90d supply, fill #0
  Filled 2024-05-31: qty 90, 90d supply, fill #1

## 2024-02-09 MED ORDER — ALBUTEROL SULFATE HFA 108 (90 BASE) MCG/ACT IN AERS
2.0000 | INHALATION_SPRAY | Freq: Four times a day (QID) | RESPIRATORY_TRACT | 6 refills | Status: AC | PRN
  Filled 2024-02-09: qty 6.7, 25d supply, fill #0
  Filled 2024-04-25: qty 6.7, 25d supply, fill #1
  Filled 2024-07-24: qty 6.7, 25d supply, fill #2
  Filled 2024-10-18: qty 6.7, 25d supply, fill #3

## 2024-02-09 NOTE — Telephone Encounter (Signed)
 Trazodone filled by Rich[facility] Humbles.

## 2024-02-11 ENCOUNTER — Ambulatory Visit (HOSPITAL_COMMUNITY)
Admission: RE | Admit: 2024-02-11 | Discharge: 2024-02-11 | Disposition: A | Discharge status: Home / Self Care | Source: Ambulatory Visit | Attending: Family Medicine | Admitting: Family Medicine

## 2024-02-11 DIAGNOSIS — R251 Tremor, unspecified: Secondary | ICD-10-CM | POA: Insufficient documentation

## 2024-02-17 ENCOUNTER — Other Ambulatory Visit (HOSPITAL_COMMUNITY): Payer: Self-pay

## 2024-02-21 ENCOUNTER — Ambulatory Visit: Payer: Self-pay | Admitting: Licensed Clinical Social Worker

## 2024-02-21 NOTE — Patient Instructions (Signed)
 Visit Information  Thank you for taking time to visit with me today. Please don't hesitate to contact me if I can be of assistance to you.   Following are the goals we discussed today:   Goals Addressed             This Visit's Progress    Care Coordination Activities       Care Coordination Interventions: Patient stated that she lives with her daughter and granddaughter and food does run out an she use to get food stamps but no longer receives them, but she does received a $300 U-card every month and lives off of her late husbands SS in the amount of $1004 per month.  Patient stated that their utility bill is running $700 each month and this is causing them to run low on food.  Sw will mail a Environmental manager and educated about the [employer] and will mail a utility resources list.  Patient stated that they are also looking at housing because where they live now they have been instructed that the place will be torn down, SW will mailing a affordable housing list SW  (L. Clemons) will follow up on 03/07/2024 at 2:00 pm          Our next appointment is by telephone on 03/07/2024 at 2:00 pm  Please call the care guide team at [phone] if you need to cancel or reschedule your appointment.   If you are experiencing a Mental Health or Behavioral Health Crisis or need someone to talk to, please call the Suicide and Crisis Lifeline: 988 go to [facility] Urgent [facility] [address] [place] [phone]) call 911  Patient verbalizes understanding of instructions and care plan provided today and agrees to view in MyChart. Active MyChart status and patient understanding of how to access instructions and care plan via MyChart confirmed with patient.     Jeanie Cooks, PhD [facility], [facility] Social Worker Direct Dial: [phone]  Fax: [phone]

## 2024-02-21 NOTE — Patient Outreach (Signed)
 Care Coordination   Initial Visit Note   02/21/2024 Name: Erica Morris MRN: [id number] DOB: 09-Nov-1946  Erica Morris is a 78 y.o. year old female who sees Caro Laroche, DO for primary care. I spoke with  Erica Morris by phone today.  What matters to the patients health and wellness today?  Housing and food Insecurities and SDOH    Goals Addressed             This Visit's Progress    Care Coordination Activities       Care Coordination Interventions: Patient stated that she lives with her daughter and granddaughter and food does run out an she use to get food stamps but no longer receives them, but she does received a $300 U-card every month and lives off of her late husbands SS in the amount of $1004 per month.  Patient stated that their utility bill is running $700 each month and this is causing them to run low on food.  Sw will mail a Environmental manager and educated about the [employer] and will mail a utility resources list.  Patient stated that they are also looking at housing because where they live now they have been instructed that the place will be torn down, SW will mailing a affordable housing list SW  (L. Clemons) will follow up on 03/07/2024 at 2:00 pm          SDOH assessments and interventions completed:  Yes  SDOH Interventions Today    Flowsheet Row Most Recent Value  SDOH Interventions   Food Insecurity Interventions Community Resources Provided  [SW will mail out food pantry list]  Housing Interventions Community Resources Provided  [SW will mail housing resources]  Transportation Interventions Intervention Not Indicated  [Patient uses Pharmacist, community that insurance pays for]  Utilities Interventions Intervention Not Indicated        Care Coordination Interventions:  Yes, provided  Interventions Today    Flowsheet Row Most Recent Value  General Interventions   General Interventions Discussed/Reviewed [employer]  [SW will mail  housing list and food pantry list]        Follow up plan: Follow up call scheduled for 03/07/2024 at 2:00 pm    Encounter Outcome:  Patient Visit Completed   Jeanie Cooks, PhD [facility], [facility] Social Worker Direct Dial: [phone]  Fax: [phone]

## 2024-02-25 ENCOUNTER — Telehealth: Payer: Self-pay

## 2024-02-25 NOTE — Telephone Encounter (Signed)
 Patient calls nurse line requesting MRI results.   Results are not back yet. Discussed this with patient. Advised 2-4 weeks for result turn around time in most cases.   Advised we will call her.   Will forward to provider who ordered imaging.

## 2024-02-29 ENCOUNTER — Other Ambulatory Visit (HOSPITAL_COMMUNITY): Payer: Self-pay

## 2024-03-03 ENCOUNTER — Encounter: Payer: Self-pay | Admitting: Student

## 2024-03-07 ENCOUNTER — Encounter

## 2024-03-08 ENCOUNTER — Other Ambulatory Visit: Payer: Self-pay | Admitting: Family Medicine

## 2024-03-08 ENCOUNTER — Other Ambulatory Visit (HOSPITAL_COMMUNITY): Payer: Self-pay

## 2024-03-09 ENCOUNTER — Other Ambulatory Visit: Payer: Self-pay

## 2024-03-09 ENCOUNTER — Other Ambulatory Visit (HOSPITAL_COMMUNITY): Payer: Self-pay

## 2024-03-09 MED ORDER — LOSARTAN POTASSIUM 50 MG PO TABS
50.0000 mg | ORAL_TABLET | Freq: Every day | ORAL | 3 refills | Status: DC
  Filled 2024-03-09: qty 90, 90d supply, fill #0
  Filled 2024-06-05: qty 90, 90d supply, fill #1
  Filled 2024-06-19: qty 90, 90d supply, fill #2

## 2024-03-10 ENCOUNTER — Emergency Department (HOSPITAL_COMMUNITY)
Admission: EM | Admit: 2024-03-10 | Discharge: 2024-03-10 | Disposition: A | Discharge status: Home / Self Care | Attending: Emergency Medicine | Admitting: Emergency Medicine

## 2024-03-10 ENCOUNTER — Other Ambulatory Visit

## 2024-03-10 ENCOUNTER — Encounter: Payer: Self-pay | Admitting: Family Medicine

## 2024-03-10 ENCOUNTER — Ambulatory Visit: Admitting: Family Medicine

## 2024-03-10 ENCOUNTER — Emergency Department (HOSPITAL_COMMUNITY)

## 2024-03-10 ENCOUNTER — Other Ambulatory Visit: Payer: Self-pay

## 2024-03-10 VITALS — BP 180/82 | HR 49 | Ht 63.0 in | Wt 213.0 lb

## 2024-03-10 DIAGNOSIS — I10 Essential (primary) hypertension: Secondary | ICD-10-CM | POA: Insufficient documentation

## 2024-03-10 DIAGNOSIS — Z7982 Long term (current) use of aspirin: Secondary | ICD-10-CM | POA: Insufficient documentation

## 2024-03-10 DIAGNOSIS — R0789 Other chest pain: Secondary | ICD-10-CM | POA: Diagnosis not present

## 2024-03-10 DIAGNOSIS — R0602 Shortness of breath: Secondary | ICD-10-CM | POA: Diagnosis not present

## 2024-03-10 DIAGNOSIS — J449 Chronic obstructive pulmonary disease, unspecified: Secondary | ICD-10-CM | POA: Diagnosis not present

## 2024-03-10 DIAGNOSIS — M79601 Pain in right arm: Secondary | ICD-10-CM | POA: Insufficient documentation

## 2024-03-10 DIAGNOSIS — R079 Chest pain, unspecified: Secondary | ICD-10-CM | POA: Diagnosis not present

## 2024-03-10 DIAGNOSIS — Z79899 Other long term (current) drug therapy: Secondary | ICD-10-CM | POA: Diagnosis not present

## 2024-03-10 LAB — BASIC METABOLIC PANEL WITH GFR
Anion gap: 8 (ref 5–15)
BUN: 17 mg/dL (ref 8–23)
CO2: 26 mmol/L (ref 22–32)
Calcium: 8.5 mg/dL — ABNORMAL LOW (ref 8.9–10.3)
Chloride: 108 mmol/L (ref 98–111)
Creatinine, Ser: 1.13 mg/dL — ABNORMAL HIGH (ref 0.44–1.00)
GFR, Estimated: 50 mL/min — ABNORMAL LOW (ref >60)
Glucose, Bld: 105 mg/dL — ABNORMAL HIGH (ref 70–99)
Potassium: 3.5 mmol/L (ref 3.5–5.1)
Sodium: 142 mmol/L (ref 135–145)

## 2024-03-10 LAB — TROPONIN I (HIGH SENSITIVITY)
Troponin I (High Sensitivity): 7 ng/L (ref <18)
Troponin I (High Sensitivity): 7 ng/L (ref <18)

## 2024-03-10 LAB — CBC
HCT: 37.3 % (ref 36.0–46.0)
Hemoglobin: 11.8 g/dL — ABNORMAL LOW (ref 12.0–15.0)
MCH: 27.2 pg (ref 26.0–34.0)
MCHC: 31.6 g/dL (ref 30.0–36.0)
MCV: 85.9 fL (ref 80.0–100.0)
Platelets: 214 10*3/uL (ref 150–400)
RBC: 4.34 MIL/uL (ref 3.87–5.11)
RDW: 13.5 % (ref 11.5–15.5)
WBC: 8.6 10*3/uL (ref 4.0–10.5)
nRBC: 0 % (ref 0.0–0.2)

## 2024-03-10 LAB — GLUCOSE, CAPILLARY: Glucose-Capillary: 101 mg/dL — ABNORMAL HIGH (ref 70–99)

## 2024-03-10 LAB — D-DIMER, QUANTITATIVE: D-Dimer, Quant: 0.44 ug{FEU}/mL (ref 0.00–0.50)

## 2024-03-10 LAB — BRAIN NATRIURETIC PEPTIDE: B Natriuretic Peptide: 168.3 pg/mL — ABNORMAL HIGH (ref 0.0–100.0)

## 2024-03-10 MED ORDER — ASPIRIN 81 MG PO CHEW: 324.0000 mg | CHEWABLE_TABLET | Freq: Once | ORAL | Status: DC

## 2024-03-10 MED ORDER — ASPIRIN 325 MG PO TABS
325.0000 mg | ORAL_TABLET | Freq: Once | ORAL | Status: AC
Start: 2024-03-10 — End: 2024-03-10
  Administered 2024-03-10: 325 mg via ORAL

## 2024-03-10 MED ORDER — NITROGLYCERIN 0.4 MG SL SUBL
0.4000 mg | SUBLINGUAL_TABLET | Freq: Once | SUBLINGUAL | Status: AC
  Administered 2024-03-10: 0.4 mg via SUBLINGUAL

## 2024-03-10 NOTE — Patient Instructions (Signed)
 It was great to see you!  Our plans for today:  - We ordered a bone density scan for you. You can call [facility] at [phone] to schedule this.  -   We are checking some labs today, we will release these results to your MyChart.  Take care and seek immediate care sooner if you develop any concerns.   Dr. Linwood Dibbles

## 2024-03-10 NOTE — ED Provider Notes (Signed)
 [place] EMERGENCY DEPARTMENT AT [facility] Provider Note   CSN: [id number] Arrival date & time: 03/10/24  1010     History  Chief Complaint  Patient presents with   Chest Pain    Erica Morris is a 78 y.o. female.  78 year old female with a history of COPD, MI, and hypertension who presents to the emergency department with chest discomfort and shortness of breath and chest pain.  Patient reports that yesterday she started experiencing some pain and tingling of her right arm.  Says that it radiates into her chest.  Not exertional or pleuritic.  Last for 5 minutes at a time.  Initially was intermittent but says that the pain has become more constant of her right arm.  Also has had some shortness of breath and a dry cough.  Does have a history of COPD and attributes it to that.  Was sent in from her PCP office after they had an EKG that showed some nonspecific ST changes.  Did have a blood pressure of 200 systolic there.  Was given 324 of aspirin and sublingual nitroglycerin without improvement of her pain.       Home Medications Prior to Admission medications   Medication Sig Start Date End Date Taking? Authorizing Provider  acetaminophen (TYLENOL) 500 MG tablet Take 1,000 mg by mouth every 6 (six) hours as needed for moderate pain. Patient not taking: Reported on 12/30/2023    [provider]  albuterol (VENTOLIN HFA) 108 (90 Base) MCG/ACT inhaler Inhale 2 puffs into the lungs every 6 (six) hours as needed for wheezing/shortness of breath 02/09/24   Caro Laroche, DO  amLODipine (NORVASC) 5 MG tablet Take 1 tablet (5 mg total) by mouth daily. 02/09/24   Caro Laroche, DO  aspirin EC 81 MG tablet Take 81 mg by mouth every morning.     [provider]  atorvastatin (LIPITOR) 80 MG tablet Take 1 tablet (80 mg total) by mouth daily. 01/03/24   Caro Laroche, DO  bisacodyl (DULCOLAX) 10 MG suppository Place 1 suppository (10 mg total) rectally at  bedtime. Until having normal bowel movements. Patient not taking: Reported on 12/30/2023 05/13/23   Valetta Close, MD  cariprazine [facility]) 1.5 MG capsule take 1 capsule by mouth daily 10/19/23     cariprazine (VRAYLAR) 1.5 MG capsule Take 1 capsule (1.5 mg total) by mouth daily. 12/15/23     cromolyn (OPTICROM) 4 % ophthalmic solution 1 drop 2 (two) times daily. Patient not taking: Reported on 12/30/2023 09/08/23   [provider]  diclofenac Sodium (VOLTAREN) 1 % GEL Apply topically. Patient not taking: Reported on 12/30/2023 05/14/20   [provider]  escitalopram (LEXAPRO) 20 MG tablet Take 1 tablet (20 mg total) by mouth daily. 10/19/23     escitalopram (LEXAPRO) 20 MG tablet Take 1 tablet (20 mg total) by mouth daily. 12/15/23     ferrous gluconate (FERGON) 324 MG tablet Take 1 tablet (324 mg total) by mouth daily with breakfast. If makes you constipated, reduce to every other day. 05/14/23   Valetta Close, MD  fluticasone [facility]) 50 MCG/ACT nasal spray Place 2 sprays into both nostrils daily. Patient not taking: Reported on 12/30/2023 04/09/23     Fluticasone-Umeclidin-Vilant (TRELEGY ELLIPTA) 100-62.5-25 MCG/ACT AEPB Inhale 1 puff into the lungs daily. 12/30/23   Caro Laroche, DO  folic acid (FOLVITE) 1 MG tablet Take 1 tablet (1 mg total) by mouth daily. 05/14/23   Fayette Pho  M, MD  furosemide (LASIX) 20 MG tablet Take 2 tablets (40 mg total) by mouth 2 (two) times daily. 07/03/23   Caro Laroche, DO  halobetasol (ULTRAVATE) 0.05 % ointment APPLY TO AFFECTED AREA TWICE A DAY 06/07/23   Caro Laroche, DO  lactulose (CHRONULAC) 10 GM/15ML SOLN enema Place 300 mLs rectally daily as needed for severe constipation. Until having normal bowel movements. Patient not taking: Reported on 12/30/2023 05/13/23   Valetta Close, MD  levothyroxine (SYNTHROID) 50 MCG tablet Take 1 tablet (50 mcg total) by mouth daily before breakfast. 12/30/23   Doreene Eland, MD   losartan (COZAAR) 50 MG tablet Take 1 tablet (50 mg total) by mouth at bedtime. 03/09/24   Caro Laroche, DO  magnesium oxide (MAG-OX) 400 (240 Mg) MG tablet Take 1 tablet (400 mg total) by mouth daily. 07/13/23   Caro Laroche, DO  metoprolol succinate (TOPROL-XL) 25 MG 24 hr tablet Take 0.5 tablets (12.5 mg total) by mouth at bedtime. 01/03/24   Caro Laroche, DO  nystatin (MYCOSTATIN) 100000 UNIT/ML suspension Take 5 mLs (500,000 Units total) by mouth 2 (two) times daily. Swish and spit 05/14/23   Carney Living, MD  nystatin (MYCOSTATIN/NYSTOP) powder Apply 1 application topically 2 times daily. Apply to groin and Butt - anywhere affected by rash 05/13/23   Valetta Close, MD  omeprazole (PRILOSEC) 40 MG capsule Take 1 capsule (40 mg total) by mouth in the morning and at bedtime. 10/21/23   Caro Laroche, DO  OXcarbazepine (TRILEPTAL) 150 MG tablet Take 1 tablet (150 mg total) by mouth 2 (two) times daily. 10/19/23     OXcarbazepine (TRILEPTAL) 150 MG tablet Take 1 tablet (150 mg total) by mouth 2 (two) times daily. 12/15/23     polyethylene glycol powder (MIRALAX) 17 GM/SCOOP powder TAKE 17 G BY MOUTH 2 (TWO) TIMES DAILY. UNTIL HAVING NORMAL BOWEL MOVEMENTS. 11/19/23   Caro Laroche, DO  potassium chloride SA (KLOR-CON M20) 20 MEQ tablet Take 1 tablet (20 mEq total) by mouth 2 (two) times daily. 05/14/23   Valetta Close, MD  psyllium (HYDROCIL/METAMUCIL) 95 % PACK Take 1 packet by mouth daily. 05/14/22   Littie Deeds, MD  RESTASIS 0.05 % ophthalmic emulsion 1 drop 2 (two) times daily. 09/08/23   [provider]  senna (SENOKOT) 8.6 MG TABS tablet Take 1 tablet (8.6 mg total) by mouth 2 (two) times daily. 05/13/23   Valetta Close, MD  traZODone (DESYREL) 50 MG tablet Take 1 tablet (50 mg total) by mouth at bedtime. 12/15/23         Allergies    Lisinopril, Codeine phosphate, and Morphine and codeine    Review of Systems   Review of Systems  Physical  Exam Updated Vital Signs BP (!) 125/57 (BP Location: Right Arm)   Pulse (!) 46   Temp 98.3 F (36.8 C) (Oral)   Resp 18  Physical Exam Vitals and nursing note reviewed.  Constitutional:      General: She is not in acute distress.    Appearance: She is well-developed.  HENT:     Head: Normocephalic and atraumatic.     Right Ear: External ear normal.     Left Ear: External ear normal.     Nose: Nose normal.  Eyes:     Extraocular Movements: Extraocular movements intact.     Conjunctiva/sclera: Conjunctivae normal.     Pupils: Pupils are equal, round, and reactive to light.  Cardiovascular:     Rate and Rhythm: Normal rate and regular rhythm.     Heart sounds: No murmur heard.    Comments: Radial pulses 2+ bilaterally Pulmonary:     Effort: Pulmonary effort is normal. No respiratory distress.     Breath sounds: Normal breath sounds.  Musculoskeletal:     Cervical back: Normal range of motion and neck supple.     Right lower leg: No edema.     Left lower leg: No edema.     Comments: No swelling of the right upper extremity.  Right hand appears warm and well-perfused.  Full range of motion of the right arm.  Full strength of the right arm.  Skin:    General: Skin is warm and dry.  Neurological:     Mental Status: She is alert and oriented to person, place, and time. Mental status is at baseline.     Cranial Nerves: No cranial nerve deficit.     Sensory: No sensory deficit.     Motor: No weakness.  Psychiatric:        Mood and Affect: Mood normal.     ED Results / Procedures / Treatments   Labs (all labs ordered are listed, but only abnormal results are displayed) Labs Reviewed  BASIC METABOLIC PANEL WITH GFR - Abnormal; Notable for the following components:      Result Value   Glucose, Bld 105 (*)    Creatinine, Ser 1.13 (*)    Calcium 8.5 (*)    GFR, Estimated 50 (*)    All other components within normal limits  CBC - Abnormal; Notable for the following components:    Hemoglobin 11.8 (*)    All other components within normal limits  BRAIN NATRIURETIC PEPTIDE - Abnormal; Notable for the following components:   B Natriuretic Peptide 168.3 (*)    All other components within normal limits  D-DIMER, QUANTITATIVE  CBG MONITORING, ED  TROPONIN I (HIGH SENSITIVITY)  TROPONIN I (HIGH SENSITIVITY)    EKG None  Radiology DG Chest Portable 1 View Result Date: 03/10/2024 CLINICAL DATA:  Chest pain. EXAM: PORTABLE CHEST 1 VIEW COMPARISON:  Two-view chest x-ray 02/23/2023 FINDINGS: The heart is enlarged. Atherosclerotic calcifications are present at the aortic arch. No edema or effusion is present. No focal airspace disease is present. Mild degenerative changes are again noted at the [facility] joints. IMPRESSION: 1. Cardiomegaly without failure. 2. No acute cardiopulmonary disease. Electronically Signed   By: Marin Roberts M.D.   On: 03/10/2024 11:31    Procedures Procedures    Medications Ordered in ED Medications - No data to display  ED Course/ Medical Decision Making/ A&P Clinical Course as of 03/10/24 1617  Fri Mar 10, 2024  1531 Signed out to Dr Silverio Lay [RP]    Clinical Course User Index [RP] Rondel Baton, MD                                 Medical Decision Making Amount and/or Complexity of Data Reviewed Labs: ordered. Radiology: ordered.   Erica Morris is a 78 y.o. female with comorbidities that complicate the patient evaluation including COPD, MI, and hypertension who presents emergency department with arm discomfort, chest discomfort, and mild shortness of breath  Initial Ddx:  MI, PE, pneumonia, dissection, pericarditis, costochondritis, reflux  MDM:  Concerned about patient possibly having an MI with her shortness of breath and arm/chest discomfort.  Also  considering pulmonary embolism but patient is not high risk so we will obtain a D-dimer at this time.  Considered dissection but with their symmetric pulses, history, and  description of the pain feel it is less likely.  If chest x-ray reveals widened mediastinum or any other concerning findings will consider CTA.  No wheezing at this time to suggest COPD exacerbation.  No infectious symptoms to suggest pneumonia at this time that would be causing pleuritic chest pain.  Already given aspirin by EMS.  Plan:  Labs Troponin D-dimer EKG Chest x-ray  ED Summary/Re-evaluation:  Patient reassessed and is overall well-appearing.  EKG and initial troponin unremarkable.  Chest x-ray without acute findings.  D-dimer WNL.  Signed out to the oncoming physician awaiting second troponin.  This patient presents to the ED for concern of complaints listed in HPI, this involves an extensive number of treatment options, and is a complaint that carries with it a high risk of complications and morbidity. Disposition including potential need for admission considered.   Dispo: Pending remainder of workup  Records reviewed Outpatient Clinic Notes The following labs were independently interpreted: Chemistry and show no acute abnormality I independently reviewed the following imaging with scope of interpretation limited to determining acute life threatening conditions related to emergency care: Chest x-ray and agree with the radiologist interpretation with the following exceptions: none I personally reviewed and interpreted cardiac monitoring: normal sinus rhythm  I personally reviewed and interpreted the pt's EKG: see above for interpretation  I have reviewed the patients home medications and made adjustments as needed Social Determinants of health:  Geriatric   Final Clinical Impression(s) / ED Diagnoses Final diagnoses:  Atypical chest pain  Pain of right upper extremity  Shortness of breath    Rx / DC Orders ED Discharge Orders          Ordered    Ambulatory referral to Cardiology        03/10/24 1605              Rondel Baton, MD 03/10/24 1617

## 2024-03-10 NOTE — Assessment & Plan Note (Signed)
 With R arm pain. Highest concern is cardiac etiology. EKG in clinic with no ST elevations though with known cardiac history and BP elevations worsening in clinic, sending to ED via EMS for concern for ACS. 325mg  ASA and NTG given in clinic with minimal relief.

## 2024-03-10 NOTE — Addendum Note (Signed)
 Addended by: Drusilla Kanner A on: 03/10/2024 10:02 AM   Modules accepted: Orders

## 2024-03-10 NOTE — ED Provider Notes (Signed)
  Physical Exam  BP (!) 125/57 (BP Location: Right Arm)   Pulse (!) 46   Temp 98.3 F (36.8 C) (Oral)   Resp 18   Physical Exam  Procedures  Procedures  ED Course / MDM   Clinical Course as of 03/10/24 1705  Fri Mar 10, 2024  1531 Signed out to Dr Silverio Lay [RP]    Clinical Course User Index [RP] Rondel Baton, MD   Medical Decision Making Care assumed at 3 PM.  Patient is here with chest pain.  Negative D-dimer.  Initial troponin negative and signout pending second troponin.  5:05 PM Delta troponin negative.  Stable for discharge.  Problems Addressed: Atypical chest pain: acute illness or injury Pain of right upper extremity: acute illness or injury Shortness of breath: acute illness or injury  Amount and/or Complexity of Data Reviewed Labs: ordered. Radiology: ordered.          Charlynne Pander, MD 03/10/24 [phone]

## 2024-03-10 NOTE — Progress Notes (Signed)
   SUBJECTIVE:   CHIEF COMPLAINT / HPI:   Arm pain - since yesterday. Associated with chest pain, difficulty breathing, nausea. No rashes, blisters, trauma. Feels similar to prior heart attacks.   Mouth complaints - longstanding h/o jerky movements of mouth, last seen last month for same. Per chart review, has h/o since 2021. 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 MRI brain without acute findings. - previously on medication for this but unsure what it was  Hypertension: - Medications: losartan, lasix - Compliance: has been out of losartan for a few days - Checking BP at home: no  In the process of moving. Currently living with daughter, French Ana. She works at [employer] all day. Has ongoing difficulties with transportation.   HM - due for DEXA, shingles vaccine   OBJECTIVE:   BP (!) 180/82   Pulse (!) 49   Ht 5\' 3"  (1.6 m)   Wt 213 lb (96.6 kg)   SpO2 100%   BMI 37.73 kg/m   Gen: elderly, well appearing, in NAD Card: Regular rhythm, bradycardic Lungs: CTAB Ext: WWP, 1+ pitting edema   ASSESSMENT/PLAN:   Chest pressure With R arm pain. Highest concern is cardiac etiology. EKG in clinic with no ST elevations though with known cardiac history and BP elevations worsening in clinic, sending to ED via EMS for concern for ACS. 325mg  ASA and NTG given in clinic with minimal relief.    Remainder of problems to be addressed in future visit.  Caro Laroche, DO

## 2024-03-10 NOTE — ED Triage Notes (Signed)
 EMS from PCP office with c/o central chest pressure x 3 days, radiating to right arm.  Pt HTN on scene 200 sbp. A&Ox4, GCS 15. Pt states, "feels like last heart attack" PCP admin asa 324mg  and 0.4mg  nitroglycerin with no relief, just drop in pressure down to 130sbp   12 lead per medics showed SB, no ectopy.   PMH: MI (unknown timeline) CHF

## 2024-03-10 NOTE — Discharge Instructions (Addendum)
You were seen for your chest pain in the emergency department.   At home, please take tylenol for your pain.    Follow-up with your primary doctor in 2-3 days regarding your visit.  Cardiology will be calling you regarding an appointment within the next 72 hours.  You may contact them if you do not hear from them in that time using the information in this packet.  Return immediately to the emergency department if you experience any of the following: Worsening pain, difficulty breathing, unexplained vomiting or sweating, or any other concerning symptoms.    Thank you for visiting our Emergency Department. It was a pleasure taking care of you today.

## 2024-03-13 ENCOUNTER — Other Ambulatory Visit: Payer: Self-pay

## 2024-03-13 ENCOUNTER — Other Ambulatory Visit (HOSPITAL_COMMUNITY): Payer: Self-pay

## 2024-03-14 ENCOUNTER — Telehealth: Payer: Self-pay | Admitting: *Deleted

## 2024-03-14 NOTE — Progress Notes (Signed)
 Complex Care Management Care Guide Note  03/14/2024 Name: Erica Morris MRN: [id number] DOB: 02/21/1946  Erica Morris is a 78 y.o. year old female who is a primary care patient of Rumball, Alison M, DO and is actively engaged with the care management team. I reached out to Erica Morris by phone today to assist with re-scheduling  with the BSW.  Follow up plan: Unsuccessful telephone outreach attempt made. A HIPAA compliant phone message was left for the patient providing contact information and requesting a return call.  Barnie Bora  [facility]  [facility], [facility] Guide  Direct Dial: [phone]  Fax [phone]

## 2024-03-21 NOTE — Progress Notes (Signed)
 Complex Care Management Care Guide Note  03/21/2024 Name: Erica Morris MRN: [id number] DOB: [birth year]  [name] is a 78 y.o. year old female who is a primary care patient of Rumball, Alison M, DO and is actively engaged with the care management team. I reached out to [name] by phone today to assist with re-scheduling  with the BSW.  Follow up plan: Unsuccessful telephone outreach attempt made. A HIPAA compliant phone message was left for the patient providing contact information and requesting a return call. No further outreach attempts will be made at this time. We have been unable to contact the patient to reschedule for complex care management services.  Barnie Bora  [facility]  Value[facility]Based Care Institute, [facility] Guide  Direct Dial: [phone]  Fax [phone]

## 2024-03-25 ENCOUNTER — Other Ambulatory Visit (HOSPITAL_COMMUNITY): Payer: Self-pay

## 2024-03-31 ENCOUNTER — Other Ambulatory Visit (HOSPITAL_COMMUNITY): Payer: Self-pay

## 2024-03-31 ENCOUNTER — Other Ambulatory Visit: Payer: Self-pay | Admitting: Family Medicine

## 2024-03-31 DIAGNOSIS — E039 Hypothyroidism, unspecified: Secondary | ICD-10-CM

## 2024-03-31 MED ORDER — LEVOTHYROXINE SODIUM 50 MCG PO TABS
50.0000 ug | ORAL_TABLET | Freq: Every day | ORAL | 0 refills | Status: DC
  Filled 2024-03-31: qty 90, 90d supply, fill #0

## 2024-04-01 ENCOUNTER — Other Ambulatory Visit (HOSPITAL_COMMUNITY): Payer: Self-pay

## 2024-04-10 ENCOUNTER — Other Ambulatory Visit (HOSPITAL_COMMUNITY): Payer: Self-pay

## 2024-04-14 ENCOUNTER — Other Ambulatory Visit (HOSPITAL_COMMUNITY): Payer: Self-pay

## 2024-04-14 ENCOUNTER — Other Ambulatory Visit: Payer: Self-pay

## 2024-04-25 ENCOUNTER — Other Ambulatory Visit: Payer: Self-pay | Admitting: Family Medicine

## 2024-04-25 ENCOUNTER — Other Ambulatory Visit (HOSPITAL_COMMUNITY): Payer: Self-pay

## 2024-04-25 MED ORDER — NYSTATIN 100000 UNIT/ML MT SUSP
5.0000 mL | Freq: Two times a day (BID) | OROMUCOSAL | 0 refills | Status: AC
  Filled 2024-04-25: qty 300, 30d supply, fill #0

## 2024-04-26 ENCOUNTER — Other Ambulatory Visit: Payer: Self-pay

## 2024-05-09 ENCOUNTER — Other Ambulatory Visit: Payer: Self-pay

## 2024-05-09 ENCOUNTER — Other Ambulatory Visit (HOSPITAL_COMMUNITY): Payer: Self-pay

## 2024-05-11 ENCOUNTER — Other Ambulatory Visit: Payer: Self-pay

## 2024-05-11 ENCOUNTER — Other Ambulatory Visit (HOSPITAL_COMMUNITY): Payer: Self-pay

## 2024-05-12 ENCOUNTER — Other Ambulatory Visit (HOSPITAL_COMMUNITY): Payer: Self-pay

## 2024-05-25 ENCOUNTER — Other Ambulatory Visit (HOSPITAL_COMMUNITY): Payer: Self-pay

## 2024-05-25 ENCOUNTER — Ambulatory Visit: Payer: 59

## 2024-05-25 VITALS — Ht 63.0 in | Wt 210.0 lb

## 2024-05-25 DIAGNOSIS — Z0000 Encounter for general adult medical examination without abnormal findings: Secondary | ICD-10-CM

## 2024-05-25 NOTE — Progress Notes (Signed)
 Because this visit was a virtual/telehealth visit,  certain criteria was not obtained, such a blood pressure, CBG if applicable, and timed get up and go. Any medications not marked as taking were not mentioned during the medication reconciliation part of the visit. Any vitals not documented were not able to be obtained due to this being a telehealth visit or patient was unable to self-report a recent blood pressure reading due to a lack of equipment at home via telehealth. Vitals that have been documented are verbally provided by the patient.   Subjective:   Erica Morris is a 78 y.o. who presents for a Medicare Wellness preventive visit.  As a reminder, Annual Wellness Visits don't include a physical exam, and some assessments may be limited, especially if this visit is performed virtually. We may recommend an in-person follow-up visit with your provider if needed.  Visit Complete: Virtual I connected with  Erica Morris on 05/25/24 by a audio enabled telemedicine application and verified that I am speaking with the correct person using two identifiers.  Patient Location: Home  Provider Location: Office/Clinic  I discussed the limitations of evaluation and management by telemedicine. The patient expressed understanding and agreed to proceed.  Vital Signs: Because this visit was a virtual/telehealth visit, some criteria may be missing or patient reported. Any vitals not documented were not able to be obtained and vitals that have been documented are patient reported.  VideoDeclined- This patient declined Librarian, academic. Therefore the visit was completed with audio only.  Persons Participating in Visit: Patient.  AWV Questionnaire: No: Patient Medicare AWV questionnaire was not completed prior to this visit.  Cardiac Risk Factors include: advanced age (>33men, >42 women);dyslipidemia;hypertension;obesity (BMI >30kg/m2);sedentary lifestyle;family history  of premature cardiovascular disease     Objective:    Today's Vitals   05/25/24 0853  Weight: 210 lb (95.3 kg)  Height: 5' 3 (1.6 m)  PainSc: 0-No pain   Body mass index is 37.2 kg/m.     05/25/2024    8:55 AM 01/03/2024   10:04 AM 05/25/2023   10:54 AM 05/24/2023    9:07 AM 02/23/2023    1:20 PM 01/08/2023   10:01 AM 10/15/2022    3:07 PM  Advanced Directives  Does Patient Have a Medical Advance Directive? No No No No No No No  Would patient like information on creating a medical advance directive? No - Patient declined  No - Patient declined Yes (MAU/Ambulatory/Procedural Areas - Information given) No - Patient declined No - Patient declined     Current Medications (verified) Outpatient Encounter Medications as of 05/25/2024  Medication Sig   acetaminophen  (TYLENOL ) 500 MG tablet Take 1,000 mg by mouth every 6 (six) hours as needed for moderate pain. (Patient not taking: Reported on 12/30/2023)   albuterol  (VENTOLIN  HFA) 108 (90 Base) MCG/ACT inhaler Inhale 2 puffs into the lungs every 6 (six) hours as needed for wheezing/shortness of breath   amLODipine  (NORVASC ) 5 MG tablet Take 1 tablet (5 mg total) by mouth daily.   aspirin  EC 81 MG tablet Take 81 mg by mouth every morning.    atorvastatin  (LIPITOR ) 80 MG tablet Take 1 tablet (80 mg total) by mouth daily.   bisacodyl  (DULCOLAX) 10 MG suppository Place 1 suppository (10 mg total) rectally at bedtime. Until having normal bowel movements. (Patient not taking: Reported on 12/30/2023)   cariprazine  (VRAYLAR ) 1.5 MG capsule take 1 capsule by mouth daily   cariprazine  (VRAYLAR ) 1.5 MG capsule  Take 1 capsule (1.5 mg total) by mouth daily.   cromolyn (OPTICROM) 4 % ophthalmic solution 1 drop 2 (two) times daily. (Patient not taking: Reported on 12/30/2023)   diclofenac  Sodium (VOLTAREN ) 1 % GEL Apply topically. (Patient not taking: Reported on 12/30/2023)   escitalopram  (LEXAPRO ) 20 MG tablet Take 1 tablet (20 mg total) by mouth daily.    escitalopram  (LEXAPRO ) 20 MG tablet Take 1 tablet (20 mg total) by mouth daily.   ferrous gluconate  (FERGON) 324 MG tablet Take 1 tablet (324 mg total) by mouth daily with breakfast. If makes you constipated, reduce to every other day.   fluticasone  (FLONASE ) 50 MCG/ACT nasal spray Place 2 sprays into both nostrils daily. (Patient not taking: Reported on 12/30/2023)   Fluticasone -Umeclidin-Vilant (TRELEGY ELLIPTA ) 100-62.5-25 MCG/ACT AEPB Inhale 1 puff into the lungs daily.   folic acid  (FOLVITE ) 1 MG tablet Take 1 tablet (1 mg total) by mouth daily.   furosemide  (LASIX ) 20 MG tablet Take 2 tablets (40 mg total) by mouth 2 (two) times daily.   halobetasol  (ULTRAVATE ) 0.05 % ointment APPLY TO AFFECTED AREA TWICE A DAY   lactulose  (CHRONULAC ) 10 GM/15ML SOLN enema Place 300 mLs rectally daily as needed for severe constipation. Until having normal bowel movements. (Patient not taking: Reported on 12/30/2023)   levothyroxine  (SYNTHROID ) 50 MCG tablet Take 1 tablet (50 mcg total) by mouth daily before breakfast.   losartan  (COZAAR ) 50 MG tablet Take 1 tablet (50 mg total) by mouth at bedtime.   magnesium  oxide (MAG-OX) 400 (240 Mg) MG tablet Take 1 tablet (400 mg total) by mouth daily.   metoprolol  succinate (TOPROL -XL) 25 MG 24 hr tablet Take 0.5 tablets (12.5 mg total) by mouth at bedtime.   nystatin  (MYCOSTATIN ) 100000 UNIT/ML suspension Take 5 mLs (500,000 Units total) by mouth 2 (two) times daily. Swish and spit   nystatin  (MYCOSTATIN /NYSTOP ) powder Apply 1 application topically 2 times daily. Apply to groin and Butt - anywhere affected by rash   omeprazole  (PRILOSEC) 40 MG capsule Take 1 capsule (40 mg total) by mouth in the morning and at bedtime.   OXcarbazepine  (TRILEPTAL ) 150 MG tablet Take 1 tablet (150 mg total) by mouth 2 (two) times daily.   OXcarbazepine  (TRILEPTAL ) 150 MG tablet Take 1 tablet (150 mg total) by mouth 2 (two) times daily.   polyethylene glycol powder (MIRALAX ) 17 GM/SCOOP  powder TAKE 17 G BY MOUTH 2 (TWO) TIMES DAILY. UNTIL HAVING NORMAL BOWEL MOVEMENTS.   potassium chloride  SA (KLOR-CON  M20) 20 MEQ tablet Take 1 tablet (20 mEq total) by mouth 2 (two) times daily.   psyllium (HYDROCIL/METAMUCIL) 95 % PACK Take 1 packet by mouth daily.   RESTASIS 0.05 % ophthalmic emulsion 1 drop 2 (two) times daily.   senna (SENOKOT) 8.6 MG TABS tablet Take 1 tablet (8.6 mg total) by mouth 2 (two) times daily.   traZODone  (DESYREL ) 50 MG tablet Take 1 tablet (50 mg total) by mouth at bedtime.   No facility-administered encounter medications on file as of 05/25/2024.    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   CT angio neck 05/18/16 neg MRI of brain 02/2016 neg    Past Surgical History:  Procedure Laterality Date   ABDOMINAL HYSTERECTOMY     BREAST SURGERY     bx   KNEE ARTHROSCOPY     LEFT HEART CATH AND CORONARY ANGIOGRAPHY N/A 11/02/2017   Procedure: LEFT HEART CATH AND CORONARY ANGIOGRAPHY;  Surgeon: Verlin Lonni BIRCH, MD;  Location: [facility];  Service: Cardiovascular;  Laterality: N/A;   Family History   Problem Relation Age of Onset   Dementia Mother    Alcoholism Mother    COPD Father    Diabetes Sister    Hypertension Sister    COPD Brother    Lung cancer Brother    Clotting disorder Brother    Kidney disease Neg Hx    Stroke Neg Hx    Colon cancer Neg Hx    Stomach cancer Neg Hx    Rectal cancer Neg Hx    Esophageal cancer Neg Hx    Pancreatic cancer Neg Hx    Liver cancer Neg Hx    Social History   Socioeconomic History   Marital status: Widowed    Spouse name: Not on file   Number of children: 3   Years of education: Not on file   Highest education level: Not on file  Occupational History   Not on file  Tobacco Use   Smoking status: Never    Passive exposure: Past   Smokeless tobacco: Never  Vaping Use   Vaping status: Never Used  Substance and Sexual Activity   Alcohol use: Not Currently    Comment: occasional   Drug use: No   Sexual activity: Never    Birth control/protection: None  Other Topics Concern   Not on file  Social History Narrative   Current Social History 08/18/2017            Patient lives with daughter and 12 yo granddaughter in one level home 08/18/2017   Transportation: Patient relies on [employer] 08/18/2017   Important Relationships: Nobody 08/18/2017    Pets: Dog (Pebbles) cat Lenora) and two hamsters 08/18/2017   Education / Work:  8th grade/ Works around house 08/18/2017   Interests / Fun: TV and crosswords 08/18/2017   Current Stressors: Living situation and food concerns 08/18/2017   L. Jori, RN, BSN                                                                                                 Social Drivers of Health   Financial Resource Strain: Low Risk  (05/25/2024)   Overall Financial Resource Strain (CARDIA)  Difficulty of Paying Living Expenses: Not hard at all  Food Insecurity: Food Insecurity Present (05/25/2024)   Hunger Vital Sign    Worried About Running Out of Food in the Last Year: Sometimes true    Ran Out of Food  in the Last Year: Sometimes true  Transportation Needs: No Transportation Needs (05/25/2024)   PRAPARE - Administrator, Civil Service (Medical): No    Lack of Transportation (Non-Medical): No  Physical Activity: Inactive (05/25/2024)   Exercise Vital Sign    Days of Exercise per Week: 0 days    Minutes of Exercise per Session: 0 min  Stress: No Stress Concern Present (05/25/2024)   [employer] of Occupational Health - Occupational Stress Questionnaire    Feeling of Stress: Only a little  Social Connections: Socially Isolated (05/25/2024)   Social Connection and Isolation Panel    Frequency of Communication with Friends and Family: Never    Frequency of Social Gatherings with Friends and Family: Three times a week    Attends Religious Services: Never    Active Member of Clubs or Organizations: No    Attends Banker Meetings: Never    Marital Status: Widowed    Tobacco Counseling Counseling given: Not Answered    Clinical Intake:  Pre-visit preparation completed: Yes  Pain : No/denies pain Pain Score: 0-No pain     BMI - recorded: 37.2 Nutritional Status: BMI > 30  Obese Nutritional Risks: None Diabetes: No  Lab Results  Component Value Date   HGBA1C 5.8 (A) 11/08/2019   HGBA1C 5.7 (H) 01/09/2018   HGBA1C 5.5 10/28/2017     How often do you need to have someone help you when you read instructions, pamphlets, or other written materials from your doctor or pharmacy?: 1 - Never  Interpreter Needed?: No  Information entered by :: Rayfield Beem N. Aariyah Sampey, LPN.   Activities of Daily Living     05/25/2024    8:57 AM  In your present state of health, do you have any difficulty performing the following activities:  Hearing? 1  Vision? 0  Difficulty concentrating or making decisions? 0  Walking or climbing stairs? 1  Dressing or bathing? 0  Doing errands, shopping? 1  Preparing Food and eating ? N  Using the Toilet? N  In the past six  months, have you accidently leaked urine? N  Do you have problems with loss of bowel control? N  Managing your Medications? N  Managing your Finances? N  Housekeeping or managing your Housekeeping? N    Patient Care Team: [name], DO as PCP - General (Family Medicine) Elner Arley LABOR, MD as Consulting Physician (Ophthalmology)  I have updated your Care Teams any recent Medical Services you may have received from other providers in the past year.     Assessment:   This is a routine wellness examination for Rachael.  Hearing/Vision screen Hearing Screening - Comments:: Patient has some hearing issues. No hearing aids. Vision Screening - Comments:: Wears rx glasses - up to date with routine eye exams with Dr. Arley Elner   Goals Addressed             This Visit's Progress    Patient Stated: To be more physically active.         Depression Screen     05/25/2024    8:58 AM 03/10/2024    8:45 AM 02/04/2024    9:39 AM 01/21/2024    9:35 AM 01/03/2024  10:04 AM 12/17/2023    9:34 AM 11/19/2023   10:05 AM  PHQ 2/9 Scores  PHQ - 2 Score 0 0 0 0 0 0 0  PHQ- 9 Score 0 0 0 0 0 0 6    Fall Risk     05/25/2024    8:55 AM 03/10/2024    8:40 AM 01/21/2024    9:35 AM 01/03/2024   10:04 AM 11/19/2023    9:58 AM  Fall Risk   Falls in the past year? 0 1 1 0 0  Number falls in past yr: 0  0  0  Injury with Fall? 0  0 0 0  Risk for fall due to : No Fall Risks      Follow up Falls evaluation completed        MEDICARE RISK AT HOME:  Medicare Risk at Home Any stairs in or around the home?: No If so, are there any without handrails?: No Home free of loose throw rugs in walkways, pet beds, electrical cords, etc?: Yes Adequate lighting in your home to reduce risk of falls?: Yes Life alert?: No Use of a cane, walker or w/c?: Yes Grab bars in the bathroom?: Yes Shower chair or bench in shower?: No Elevated toilet seat or a handicapped toilet?: No  TIMED UP AND GO:  Was the  test performed?  No  Cognitive Function: Declined/Normal: No cognitive concerns noted by patient or family. Patient alert, oriented, able to answer questions appropriately and recall recent events. No signs of memory loss or confusion.    05/25/2024    8:58 AM  MMSE - Mini Mental State Exam  Not completed: Unable to complete        05/25/2024    8:56 AM 05/24/2023    9:07 AM  6CIT Screen  What Year? 0 points 0 points  What month? 0 points 0 points  What time? 0 points 0 points  Count back from 20 0 points 0 points  Months in reverse 0 points 2 points  Repeat phrase 0 points 2 points  Total Score 0 points 4 points    Immunizations Immunization History  Administered Date(s) Administered   Fluad Quad(high Dose 65+) 09/16/2021, 08/28/2022   Fluad Trivalent(High Dose 65+) 10/21/2023   Influenza Whole 10/07/2007   Influenza, High Dose Seasonal PF 08/07/2016   Influenza, Quadrivalent, Recombinant, Inj, Pf 10/14/2019   Influenza,inj,Quad PF,6+ Mos 07/29/2017, 10/14/2020   Influenza-Unspecified 08/30/2014, 07/29/2018   PFIZER Comirnaty(Gray Top)Covid-19 Tri-Sucrose Vaccine 09/16/2021   PFIZER(Purple Top)SARS-COV-2 Vaccination 02/29/2020, 03/25/2020   Pfizer(Comirnaty)Fall Seasonal Vaccine 12 years and older 10/21/2023   Pneumococcal Conjugate-13 11/05/2016   Pneumococcal Polysaccharide-23 08/30/1998, 10/07/2007, 08/02/2018   Td 04/30/2001   Tdap 11/04/2017   Zoster Recombinant(Shingrix ) 10/20/2019    Screening Tests Health Maintenance  Topic Date Due   DEXA SCAN  Never done   Zoster Vaccines- Shingrix  (2 of 2) 12/15/2019   COVID-19 Vaccine (5 - Pfizer risk 2024-25 season) 04/19/2024   INFLUENZA VACCINE  06/30/2024   Medicare Annual Wellness (AWV)  05/25/2025   DTaP/Tdap/Td (3 - Td or Tdap) 11/05/2027   Pneumococcal Vaccine: 50+ Years  Completed   Hepatitis C Screening  Completed   Hepatitis B Vaccines  Aged Out   HPV VACCINES  Aged Out   Meningococcal B Vaccine  Aged Out    Colonoscopy  Discontinued    Health Maintenance  Health Maintenance Due  Topic Date Due   DEXA SCAN  Never done   Zoster Vaccines- Shingrix  (  2 of 2) 12/15/2019   COVID-19 Vaccine (5 - Pfizer risk 2024-25 season) 04/19/2024   Health Maintenance Items Addressed: Yes Immunization record was verified by [employer].  Patient is due for the following: Shingrix , Covid and Bone Density Scan.  Additional Screening:  Vision Screening: Recommended annual ophthalmology exams for early detection of glaucoma and other disorders of the eye. Would you like a referral to an eye doctor? No    Dental Screening: Recommended annual dental exams for proper oral hygiene  Community Resource Referral / Chronic Care Management: CRR required this visit?  No   CCM required this visit?  No   Plan:    I have personally reviewed and noted the following in the patient's chart:   Medical and social history Use of alcohol, tobacco or illicit drugs  Current medications and supplements including opioid prescriptions. Patient is not currently taking opioid prescriptions. Functional ability and status Nutritional status Physical activity Advanced directives List of other physicians Hospitalizations, surgeries, and ER visits in previous 12 months Vitals Screenings to include cognitive, depression, and falls Referrals and appointments  In addition, I have reviewed and discussed with patient certain preventive protocols, quality metrics, and best practice recommendations. A written personalized care plan for preventive services as well as general preventive health recommendations were provided to patient.   Roz LOISE Fuller, LPN   [id number]   After Visit Summary: (MyChart) Due to this being a telephonic visit, the after visit summary with patients personalized plan was offered to patient via MyChart   Notes: Immunization record was verified by [facility].  Patient is due for the following: Shingrix , Covid and  Bone Density Scan.

## 2024-05-25 NOTE — Patient Instructions (Addendum)
 [name] , Thank you for taking time out of your busy schedule to complete your Annual Wellness Visit with me. I enjoyed our conversation and look forward to speaking with you again next year. I, as well as your care team,  appreciate your ongoing commitment to your health goals. Please review the following plan we discussed and let me know if I can assist you in the future. Your Game plan/ To Do List    Referrals: If you haven't heard from the office you've been referred to, please reach out to them at the phone provided.   Follow up Visits: Next Medicare AWV with our clinical staff: 05/28/2025 at 11:10 a.m. phone visit with Nurse Health Advisor   Have you seen your provider in the last 6 months (3 months if uncontrolled diabetes)? Yes Next Office Visit with your provider: Office will call patient to schedule.  Clinician Recommendations:  Aim for 30 minutes of exercise or brisk walking, 6-8 glasses of water , and 5 servings of fruits and vegetables each day.       This is a list of the screening recommended for you and due dates:  Health Maintenance  Topic Date Due   DEXA scan (bone density measurement)  Never done   Zoster (Shingles) Vaccine (2 of 2) 12/15/2019   COVID-19 Vaccine (5 - Pfizer risk 2024-25 season) 04/19/2024   Flu Shot  06/30/2024   Medicare Annual Wellness Visit  05/25/2025   DTaP/Tdap/Td vaccine (3 - Td or Tdap) 11/05/2027   Pneumococcal Vaccine for age over 37  Completed   Hepatitis C Screening  Completed   Hepatitis B Vaccine  Aged Out   HPV Vaccine  Aged Out   Meningitis B Vaccine  Aged Out   Colon Cancer Screening  Discontinued    Advanced directives: (Declined) Advance directive discussed with you today. Even though you declined this today, please call our office should you change your mind, and we can give you the proper paperwork for you to fill out. Advance Care Planning is important because it:  [x]  Makes sure you receive the medical care that is  consistent with your values, goals, and preferences  [x]  It provides guidance to your family and loved ones and reduces their decisional burden about whether or not they are making the right decisions based on your wishes.  Follow the link provided in your after visit summary or read over the paperwork we have mailed to you to help you started getting your Advance Directives in place. If you need assistance in completing these, please reach out to us  so that we can help you!  See attachments for Preventive Care and Fall Prevention Tips.

## 2024-05-31 ENCOUNTER — Other Ambulatory Visit (HOSPITAL_COMMUNITY): Payer: Self-pay

## 2024-06-05 ENCOUNTER — Other Ambulatory Visit: Payer: Self-pay | Admitting: Family Medicine

## 2024-06-05 ENCOUNTER — Other Ambulatory Visit (HOSPITAL_COMMUNITY): Payer: Self-pay

## 2024-06-06 ENCOUNTER — Other Ambulatory Visit (HOSPITAL_COMMUNITY): Payer: Self-pay

## 2024-06-06 MED ORDER — METOPROLOL SUCCINATE ER 25 MG PO TB24
12.5000 mg | ORAL_TABLET | Freq: Every day | ORAL | 0 refills | Status: DC
  Filled 2024-06-06: qty 45, 90d supply, fill #0

## 2024-06-08 ENCOUNTER — Telehealth: Payer: Self-pay

## 2024-06-08 MED ORDER — CEFADROXIL 500 MG PO CAPS: 500.0000 mg | ORAL_CAPSULE | Freq: Two times a day (BID) | ORAL | 0 refills | Status: DC

## 2024-06-08 NOTE — Telephone Encounter (Signed)
 Rx sent given transportation difficulties.  If patient still having symptoms through the weekend, recommend coming to clinic for evaluation next week.

## 2024-06-08 NOTE — Telephone Encounter (Signed)
 Received call from [facility] NP regarding concerns that patient may have possible UTI.   Called patient to discuss her symptoms further.   Patient reports that she has been having pelvic pain and increased urinary frequency for the last several days. She states that she will have pain immediately before urination, no pain during and will then feel like she has to urinate again within 5- 10 minutes.   Denies fever, dysuria or blood in urine.   Advised patient that we would like for her to be evaluated in the office so that we could obtain urine for culture.   She states that she does not have transportation to get into the office tomorrow.   Advised that I would forward message to PCP for further advisement.   Chiquita JAYSON English, RN

## 2024-06-09 ENCOUNTER — Encounter: Payer: Self-pay | Admitting: Pharmacist

## 2024-06-09 ENCOUNTER — Other Ambulatory Visit (HOSPITAL_COMMUNITY): Payer: Self-pay

## 2024-06-09 ENCOUNTER — Other Ambulatory Visit: Payer: Self-pay

## 2024-06-09 MED ORDER — CEFADROXIL 500 MG PO CAPS
500.0000 mg | ORAL_CAPSULE | Freq: Two times a day (BID) | ORAL | 0 refills | Status: AC
  Filled 2024-06-09: qty 10, 5d supply, fill #0

## 2024-06-09 NOTE — Telephone Encounter (Signed)
 Called patient. Advised that antibiotic had been sent to pharmacy.   Patient states that rx needs to be sent to [facility].   Called and cancelled at [facility]. Resent to [facility].   Advised patient to call back if symptoms do not improve or worsen over the weekend.   Chiquita JAYSON English, RN

## 2024-06-09 NOTE — Addendum Note (Signed)
 Addended by: Zailah Zagami C on: 06/09/2024 09:06 AM   Modules accepted: Orders

## 2024-06-19 ENCOUNTER — Other Ambulatory Visit (HOSPITAL_COMMUNITY): Payer: Self-pay

## 2024-06-20 ENCOUNTER — Other Ambulatory Visit (HOSPITAL_COMMUNITY): Payer: Self-pay

## 2024-06-24 NOTE — Progress Notes (Signed)
 Cardiology Office Note:   Date:  07/03/2024  ID:  [name], DOB [birth year], MRN [id number] PCP:  Erica Morris HERO, DO  [facility] Providers Cardiologist:  Wendel Haws, MD Referring MD: Erica Morris HERO, DO  Chief Complaint/Reason for Referral: Chest pain ASSESSMENT:    1. Precordial pain   2. Primary hypertension   3. Hyperlipidemia LDL goal <70   4. Aortic atherosclerosis (HCC)   5. 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 Stage 3a chronic kidney disease (HCC)   7. Palpitations     PLAN:   In order of problems listed above: Chest pain:  We will obtain a coronary CTA and echocardiogram to evaluate further.  If the patient has mild obstructive coronary artery disease, they will require a statin (with goal LDL < 70) and aspirin , if they have high-grade disease we will need to consider optimal medical therapy and if symptoms are refractory to medical therapy, then a cardiac catheterization with possible PCI will be pursued to alleviate symptoms.  If they have high risk disease we will proceed directly to cardiac catheterization.   Hypertension: Continue amlodipine  5 mg, Toprol  12.5 mg, and losartan  50 mg; BP is well controlled today; repeat measure by me was 110/70. Hyperlipidemia: Continue atorvastatin  80 mg; check lipid panel, LFTs, LP(a) today. Aortic atherosclerosis: Continue aspirin  81 mg, atorvastatin  80 mg, strict blood pressure control. Diastolic dysfunction: Had LVH on echocardiogram in 2018.  Will obtain an echocardiogram now.  Continue losartan  50 mg and start Jardiance  10 mg daily. CKD stage IIIa: Continue losartan  50 mg and start Jardiance  10 mg daily for renal protection. Palpitations:  Check reflex TSH, monitor, and echocardiogram.            Dispo:  Return in about 6 months (around 01/03/2025).       Labs/tests ordered: Orders Placed This Encounter  Procedures   CT CORONARY MORPH W/CTA COR W/SCORE W/CA W/CM &/OR WO/CM   Lipoprotein  A (LPA)   Lipid panel   Comprehensive Metabolic Panel (CMET)   TSH(Reflex)   LONG TERM MONITOR (3-14 DAYS)   ECHOCARDIOGRAM COMPLETE    Current medicines are reviewed at length with the patient today.  The patient does not have concerns regarding medicines.  I spent 41 minutes reviewing all clinical data during and prior to this visit including all relevant imaging studies, laboratories, clinical information from other health systems and prior notes from both Cardiology and other specialties, interviewing the patient, conducting a complete physical examination, and coordinating care in order to formulate a comprehensive and personalized evaluation and treatment plan.   History of Present Illness:    FOCUSED PROBLEM LIST:   Hypertension Hyperlipidemia Aortic atherosclerosis Chest x-ray 2020 Type II MI 2018 Normal coronary angiography 2018 Diastolic dysfunction Mild LVH, no significant valve issues, EF 60 to 65% TTE 2018 CKD stage IIIa BMI 37 Anxiety  August 2025:  Patient consents to use of AI scribe. The patient is a 78 year old female with the above listed medical problems referred for recommendations regarding chest pain.  The patient had presented to the emergency department in April of this year with chest discomfort and shortness of breath.  There was some radiation to the right arm.  Initially the symptoms were intermittent but then became constant.  In the emergency department her troponins were reassuring however her proBNP was elevated.  Her chest x-ray demonstrated cardiomegaly without signs of heart failure.  Her EKG demonstrated sinus bradycardia with minimal ST depressions in  lead I and II as well as V4 and V5.  She was ultimately discharged home.    She has been experiencing chest discomfort and shortness of breath since her visit to the emergency department in April. The chest discomfort is described as a sensation of her heart pounding and sometimes hurting, often  associated with stress and anxiety. These symptoms occur during stressful situations and physical exertion, such as climbing stairs, which also leads to shortness of breath. She fears going to bed at night due to these symptoms and has a history of anxiety for which she previously received medication from a psychiatrist.  She also reports swelling in her feet, ankles, and legs. She experiences difficulty breathing when lying flat and has cramps in her legs at night.  She has a history of COPD, which she attributes to secondhand smoke exposure from her family and workplace. She denies smoking herself. She experiences lightheadedness and blacking out spells, particularly when upset, and notes swelling above her ankles, including her knees.  In terms of her social history, she previously worked in an environment where she was exposed to secondhand smoke. She denies any current smoking habits. She has a history of anxiety and has experienced urinary tract infections, with the most recent one occurring a couple of weeks ago.      Current Medications: Current Meds  Medication Sig   acetaminophen  (TYLENOL ) 500 MG tablet Take 1,000 mg by mouth every 6 (six) hours as needed for moderate pain.   albuterol  (VENTOLIN  HFA) 108 (90 Base) MCG/ACT inhaler Inhale 2 puffs into the lungs every 6 (six) hours as needed for wheezing/shortness of breath   amLODipine  (NORVASC ) 5 MG tablet Take 1 tablet (5 mg total) by mouth daily.   aspirin  EC 81 MG tablet Take 81 mg by mouth every morning.    atorvastatin  (LIPITOR ) 80 MG tablet Take 1 tablet (80 mg total) by mouth daily.   bisacodyl  (DULCOLAX) 10 MG suppository Place 1 suppository (10 mg total) rectally at bedtime. Until having normal bowel movements.   cariprazine  (VRAYLAR ) 1.5 MG capsule take 1 capsule by mouth daily   cariprazine  (VRAYLAR ) 1.5 MG capsule Take 1 capsule (1.5 mg total) by mouth daily.   cromolyn (OPTICROM) 4 % ophthalmic solution 1 drop 2 (two) times  daily.   diclofenac  Sodium (VOLTAREN ) 1 % GEL Apply topically.   empagliflozin  (JARDIANCE ) 10 MG TABS tablet Take 1 tablet (10 mg total) by mouth daily before breakfast.   escitalopram  (LEXAPRO ) 20 MG tablet Take 1 tablet (20 mg total) by mouth daily.   escitalopram  (LEXAPRO ) 20 MG tablet Take 1 tablet (20 mg total) by mouth daily.   ferrous gluconate  (FERGON) 324 MG tablet Take 1 tablet (324 mg total) by mouth daily with breakfast. If makes you constipated, reduce to every other day.   fluticasone  (FLONASE ) 50 MCG/ACT nasal spray Place 2 sprays into both nostrils daily.   Fluticasone -Umeclidin-Vilant (TRELEGY ELLIPTA ) 100-62.5-25 MCG/ACT AEPB Inhale 1 puff into the lungs daily.   folic acid  (FOLVITE ) 1 MG tablet Take 1 tablet (1 mg total) by mouth daily.   furosemide  (LASIX ) 20 MG tablet Take 2 tablets (40 mg total) by mouth 2 (two) times daily.   halobetasol  (ULTRAVATE ) 0.05 % ointment APPLY TO AFFECTED AREA TWICE A DAY   lactulose  (CHRONULAC ) 10 GM/15ML SOLN enema Place 300 mLs rectally daily as needed for severe constipation. Until having normal bowel movements.   levothyroxine  (SYNTHROID ) 50 MCG tablet Take 1 tablet (50 mcg total) by  mouth daily before breakfast.   losartan  (COZAAR ) 50 MG tablet Take 1 tablet (50 mg total) by mouth at bedtime.   magnesium  oxide (MAG-OX) 400 (240 Mg) MG tablet Take 1 tablet (400 mg total) by mouth daily.   metoprolol  succinate (TOPROL -XL) 25 MG 24 hr tablet Take 0.5 tablets (12.5 mg total) by mouth at bedtime. (Patient taking differently: Take 25 mg by mouth at bedtime.)   metoprolol  tartrate (LOPRESSOR ) 50 MG tablet Take 1 tablet (50 mg total) by mouth once for 1 dose. TAKE 1 TABLET TWO HOURS PRIOR TO PRIOR PROCEDURE   nystatin  (MYCOSTATIN ) 100000 UNIT/ML suspension Take 5 mLs (500,000 Units total) by mouth 2 (two) times daily. Swish and spit   nystatin  (MYCOSTATIN /NYSTOP ) powder Apply 1 application topically 2 times daily. Apply to groin and Butt - anywhere  affected by rash   omeprazole  (PRILOSEC) 40 MG capsule Take 1 capsule (40 mg total) by mouth in the morning and at bedtime.   OXcarbazepine  (TRILEPTAL ) 150 MG tablet Take 1 tablet (150 mg total) by mouth 2 (two) times daily.   OXcarbazepine  (TRILEPTAL ) 150 MG tablet Take 1 tablet (150 mg total) by mouth 2 (two) times daily.   polyethylene glycol powder (MIRALAX ) 17 GM/SCOOP powder TAKE 17 G BY MOUTH 2 (TWO) TIMES DAILY. UNTIL HAVING NORMAL BOWEL MOVEMENTS.   potassium chloride  SA (KLOR-CON  M20) 20 MEQ tablet Take 1 tablet (20 mEq total) by mouth 2 (two) times daily.   psyllium (HYDROCIL/METAMUCIL) 95 % PACK Take 1 packet by mouth daily.   RESTASIS 0.05 % ophthalmic emulsion 1 drop 2 (two) times daily.   senna (SENOKOT) 8.6 MG TABS tablet Take 1 tablet (8.6 mg total) by mouth 2 (two) times daily.   traZODone  (DESYREL ) 50 MG tablet Take 1 tablet (50 mg total) by mouth at bedtime.     Review of Systems:   Please see the history of present illness.    All other systems reviewed and are negative.     EKGs/Labs/Other Test Reviewed:   EKG: 2025 sinus bradycardia with minimal ST depressions  EKG Interpretation Date/Time:    Ventricular Rate:    PR Interval:    QRS Duration:    QT Interval:    QTC Calculation:   R Axis:      Text Interpretation:           Risk Assessment/Calculations:          Physical Exam:   VS:  BP 134/64   Pulse 64   Ht 5' 2 (1.575 m)   Wt 207 lb 6.4 oz (94.1 kg)   BMI 37.93 kg/m        Wt Readings from Last 3 Encounters:  07/03/24 207 lb 6.4 oz (94.1 kg)  06/27/24 206 lb 3.2 oz (93.5 kg)  05/25/24 210 lb (95.3 kg)      GENERAL:  No apparent distress, AOx3 HEENT:  No carotid bruits, +2 carotid impulses, no scleral icterus CAR: RRR no murmurs, gallops, rubs, or thrills RES:  Clear to auscultation bilaterally ABD:  Soft, nontender, nondistended, positive bowel sounds x 4 VASC:  +2 radial pulses, +2 carotid pulses NEURO:  CN 2-12 grossly intact;  motor and sensory grossly intact PSYCH:  No active depression or anxiety EXT:  No edema, ecchymosis, or cyanosis  Signed, Derry Kassel K Sheldon Sem, MD  07/03/2024 3:16 PM    [facility] [address] [place], [place], [place]  [address] Phone: [phone]; Fax: [phone]   Note:  This  document was prepared using Conservation officer, historic buildings and may include unintentional dictation errors.

## 2024-06-27 ENCOUNTER — Other Ambulatory Visit (HOSPITAL_COMMUNITY): Payer: Self-pay

## 2024-06-27 ENCOUNTER — Ambulatory Visit: Admitting: Family Medicine

## 2024-06-27 ENCOUNTER — Telehealth: Payer: Self-pay

## 2024-06-27 ENCOUNTER — Encounter: Payer: Self-pay | Admitting: Family Medicine

## 2024-06-27 VITALS — BP 148/62 | HR 60 | Ht 63.0 in | Wt 206.2 lb

## 2024-06-27 DIAGNOSIS — E039 Hypothyroidism, unspecified: Secondary | ICD-10-CM

## 2024-06-27 DIAGNOSIS — R251 Tremor, unspecified: Secondary | ICD-10-CM

## 2024-06-27 MED ORDER — METOPROLOL SUCCINATE ER 25 MG PO TB24: 12.5000 mg | ORAL_TABLET | Freq: Every day | ORAL | 0 refills | Status: DC

## 2024-06-27 MED ORDER — AMLODIPINE BESYLATE 5 MG PO TABS
5.0000 mg | ORAL_TABLET | Freq: Every day | ORAL | 3 refills | Status: DC
  Filled 2024-06-27 – 2024-09-04 (×2): qty 90, 90d supply, fill #0
  Filled 2024-11-28: qty 90, 90d supply, fill #1

## 2024-06-27 MED ORDER — ATORVASTATIN CALCIUM 80 MG PO TABS
80.0000 mg | ORAL_TABLET | Freq: Every day | ORAL | 3 refills | Status: AC
  Filled 2024-06-27 – 2024-07-24 (×2): qty 90, 90d supply, fill #0
  Filled 2024-07-27 – 2024-10-18 (×2): qty 90, 90d supply, fill #1

## 2024-06-27 MED ORDER — LEVOTHYROXINE SODIUM 50 MCG PO TABS
50.0000 ug | ORAL_TABLET | Freq: Every day | ORAL | 0 refills | Status: DC
  Filled 2024-06-27 – 2024-07-07 (×2): qty 90, 90d supply, fill #0

## 2024-06-27 MED ORDER — AMLODIPINE BESYLATE 5 MG PO TABS: 5.0000 mg | ORAL_TABLET | Freq: Every day | ORAL | 3 refills | Status: DC

## 2024-06-27 MED ORDER — LEVOTHYROXINE SODIUM 50 MCG PO TABS: 50.0000 ug | ORAL_TABLET | Freq: Every day | ORAL | 0 refills | Status: DC

## 2024-06-27 MED ORDER — TRELEGY ELLIPTA 100-62.5-25 MCG/ACT IN AEPB: 1.0000 | INHALATION_SPRAY | Freq: Every day | RESPIRATORY_TRACT | 6 refills | Status: DC

## 2024-06-27 MED ORDER — LOSARTAN POTASSIUM 50 MG PO TABS: 50.0000 mg | ORAL_TABLET | Freq: Every day | ORAL | 3 refills | Status: DC

## 2024-06-27 MED ORDER — FUROSEMIDE 20 MG PO TABS: 40.0000 mg | ORAL_TABLET | Freq: Two times a day (BID) | ORAL | 1 refills | Status: DC

## 2024-06-27 MED ORDER — LOSARTAN POTASSIUM 50 MG PO TABS
50.0000 mg | ORAL_TABLET | Freq: Every day | ORAL | 3 refills | Status: AC
  Filled 2024-06-27 – 2024-10-18 (×3): qty 90, 90d supply, fill #0

## 2024-06-27 MED ORDER — METOPROLOL SUCCINATE ER 25 MG PO TB24
12.5000 mg | ORAL_TABLET | Freq: Every day | ORAL | 0 refills | Status: DC
  Filled 2024-06-27 – 2024-08-19 (×2): qty 45, 90d supply, fill #0

## 2024-06-27 MED ORDER — ESCITALOPRAM OXALATE 20 MG PO TABS: 20.0000 mg | ORAL_TABLET | Freq: Every day | ORAL | 0 refills | Status: DC

## 2024-06-27 MED ORDER — ESCITALOPRAM OXALATE 20 MG PO TABS
20.0000 mg | ORAL_TABLET | Freq: Every day | ORAL | 0 refills | Status: DC
  Filled 2024-06-27 – 2024-07-27 (×2): qty 60, 60d supply, fill #0

## 2024-06-27 MED ORDER — FUROSEMIDE 20 MG PO TABS
40.0000 mg | ORAL_TABLET | Freq: Two times a day (BID) | ORAL | 1 refills | Status: AC
  Filled 2024-06-27: qty 360, 90d supply, fill #0

## 2024-06-27 MED ORDER — TRELEGY ELLIPTA 100-62.5-25 MCG/ACT IN AEPB
1.0000 | INHALATION_SPRAY | Freq: Every day | RESPIRATORY_TRACT | 6 refills | Status: AC
  Filled 2024-06-27 – 2024-07-12 (×2): qty 60, 30d supply, fill #0
  Filled 2024-08-08: qty 60, 30d supply, fill #1
  Filled 2024-09-27: qty 60, 30d supply, fill #2
  Filled 2024-11-07: qty 60, 30d supply, fill #3
  Filled 2024-12-15 – 2024-12-16 (×2): qty 60, 30d supply, fill #4

## 2024-06-27 MED ORDER — ATORVASTATIN CALCIUM 80 MG PO TABS: 80.0000 mg | ORAL_TABLET | Freq: Every day | ORAL | 3 refills | Status: DC

## 2024-06-27 NOTE — Telephone Encounter (Signed)
 Patient calls nurse line regarding prescriptions from today's visit.   She reports that these were supposed to be sent to [facility].   [facility]. [facility] at [facility]. Resent to [facility] as requested by patient.   Chiquita JAYSON English, RN

## 2024-06-27 NOTE — Patient Instructions (Signed)
 1) I have sent refills of your medication to your pharmacy. After you get home, call the [facility] pharmacy to make sure they have your prescription.  2) Please come back in 2 weeks.  3) We will check a few labs for you today.

## 2024-06-27 NOTE — Progress Notes (Unsigned)
    SUBJECTIVE:   CHIEF COMPLAINT / HPI:   Erica Morris is a 78yo F that pf med refill and shakiness. - Reports that for the past 2 days she has felt shaky and her heart racing at times. - Denies CP - Feels more short of breath from her COPD. She feels like her inhalers aren't helping as much as before.   - Reports that she has been out of meds for about a week now. This includes her oxcarbazepine , metoprolol , losartan , lexapro  - She is almost out of her inhalers and needs a refill.  - She reports she has a Cardiology appointment on 8/4 - She is unsure if she can get a ride to come back to see us  in a week.   PERTINENT  PMH / PSH: HTN, COPD, CHF, hypothyroidism, CKD  OBJECTIVE:   BP (!) 148/62   Pulse 60   Ht 5' 3 (1.6 m)   Wt 206 lb 3.2 oz (93.5 kg)   SpO2 98%   BMI 36.53 kg/m   General: Alert, pleasant woman speaking comfortably on RA. NAD. HEENT: NCAT. MMM. CV: RRR, no murmurs.  Resp: CTAB, no wheezing or crackles. Normal WOB on RA.  Abm: Soft, nontender, nondistended. BS present. Ext: trace pitting edema in BL LE  ASSESSMENT/PLAN:   Assessment & Plan Shakiness VSS and exam shows mild fluid retention, but weight is stable. Suspect shakiness and fast HR may be related to being off medications for past week. Will restart meds and have close f/u w/ Cardiology in 1 wk, and then us  in 2 wks. - Refills provided for above mentioned meds Hypothyroidism, unspecified type Previously adjusted levothyroxine  dose to 50mcg daily. Will repeat TSH today. - Cont levothyroxine  50mcg daily     Erica Cervantes, MD [facility]

## 2024-06-28 ENCOUNTER — Other Ambulatory Visit (HOSPITAL_COMMUNITY): Payer: Self-pay

## 2024-06-28 LAB — TSH: TSH: 3 u[IU]/mL (ref 0.450–4.500)

## 2024-06-29 NOTE — Assessment & Plan Note (Signed)
 Previously adjusted levothyroxine  dose to 50mcg daily. Will repeat TSH today. - Cont levothyroxine  50mcg daily

## 2024-07-03 ENCOUNTER — Other Ambulatory Visit: Payer: Self-pay | Admitting: Internal Medicine

## 2024-07-03 ENCOUNTER — Other Ambulatory Visit: Payer: Self-pay

## 2024-07-03 ENCOUNTER — Ambulatory Visit

## 2024-07-03 ENCOUNTER — Ambulatory Visit: Payer: Self-pay | Admitting: Family Medicine

## 2024-07-03 ENCOUNTER — Other Ambulatory Visit (HOSPITAL_COMMUNITY): Payer: Self-pay

## 2024-07-03 ENCOUNTER — Encounter: Payer: Self-pay | Admitting: Internal Medicine

## 2024-07-03 ENCOUNTER — Ambulatory Visit: Attending: Internal Medicine | Admitting: Internal Medicine

## 2024-07-03 VITALS — BP 134/64 | HR 64 | Ht 62.0 in | Wt 207.4 lb

## 2024-07-03 DIAGNOSIS — I700 Atherosclerosis of aorta: Secondary | ICD-10-CM

## 2024-07-03 DIAGNOSIS — N1831 Chronic kidney disease, stage 3a: Secondary | ICD-10-CM

## 2024-07-03 DIAGNOSIS — I10 Essential (primary) hypertension: Secondary | ICD-10-CM | POA: Diagnosis not present

## 2024-07-03 DIAGNOSIS — E785 Hyperlipidemia, unspecified: Secondary | ICD-10-CM

## 2024-07-03 DIAGNOSIS — R002 Palpitations: Secondary | ICD-10-CM

## 2024-07-03 DIAGNOSIS — R072 Precordial pain: Secondary | ICD-10-CM

## 2024-07-03 DIAGNOSIS — I5032 Chronic diastolic (congestive) heart failure: Secondary | ICD-10-CM

## 2024-07-03 MED ORDER — EMPAGLIFLOZIN 10 MG PO TABS: 10.0000 mg | ORAL_TABLET | Freq: Every day | ORAL | 0 refills | Status: AC

## 2024-07-03 MED ORDER — METOPROLOL TARTRATE 50 MG PO TABS
50.0000 mg | ORAL_TABLET | Freq: Once | ORAL | 0 refills | Status: DC
  Filled 2024-07-03: qty 1, 1d supply, fill #0

## 2024-07-03 MED ORDER — EMPAGLIFLOZIN 10 MG PO TABS
10.0000 mg | ORAL_TABLET | Freq: Every day | ORAL | 6 refills | Status: AC
  Filled 2024-07-03: qty 30, 30d supply, fill #0
  Filled 2024-07-25 – 2024-07-26 (×2): qty 30, 30d supply, fill #1
  Filled 2024-09-02: qty 30, 30d supply, fill #2
  Filled 2024-09-25: qty 30, 30d supply, fill #3
  Filled 2024-11-28: qty 30, 30d supply, fill #4
  Filled 2024-12-25 – 2025-01-02 (×3): qty 30, 30d supply, fill #5

## 2024-07-03 NOTE — Addendum Note (Signed)
 Addended by: SEBASTIAN JANESE GRADE on: 07/03/2024 04:06 PM   Modules accepted: Orders

## 2024-07-03 NOTE — Progress Notes (Unsigned)
 Enrolled for [employer] to mail a ZIO XT long term holter monitor to the patients address on file.

## 2024-07-03 NOTE — Addendum Note (Signed)
 Addended by: TAFFY LOVEY KIDD on: 07/03/2024 05:22 PM   Modules accepted: Orders

## 2024-07-03 NOTE — Patient Instructions (Addendum)
 Medication Instructions:  START Jardiance  10mg  Take 1 tablet once a day  *If you need a refill on your cardiac medications before your next appointment, please call your pharmacy*  Lab Work: TODAY-LIPIDS, CMET, LPa, REFLEX TSH COMPLETE LABS AT [facility] [address] If you have labs (blood work) drawn today and your tests are completely normal, you will receive your results only by: MyChart Message (if you have MyChart) OR A paper copy in the mail If you have any lab test that is abnormal or we need to change your treatment, we will call you to review the results.  Testing/Procedures: Your physician has requested that you have an echocardiogram. Echocardiography is a painless test that uses sound waves to create images of your heart. It provides your doctor with information about the size and shape of your heart and how well your heart's chambers and valves are working. This procedure takes approximately one hour. There are no restrictions for this procedure. Please do NOT wear cologne, perfume, aftershave, or lotions (deodorant is allowed). Please arrive 15 minutes prior to your appointment time.  Please note: We ask at that you not bring children with you during ultrasound (echo/ vascular) testing. Due to room size and safety concerns, children are not allowed in the ultrasound rooms during exams. Our front office staff cannot provide observation of children in our lobby area while testing is being conducted. An adult accompanying a patient to their appointment will only be allowed in the ultrasound room at the discretion of the ultrasound technician under special circumstances. We apologize for any inconvenience.  CORONARY CTA  ZIO XT- Long Term Monitor Instructions  Your physician has requested you wear a ZIO patch monitor for 3 days.  This is a single patch monitor. [employer] supplies one patch monitor per enrollment. Additional stickers are not available. Please do not apply  patch if you will be having a Nuclear Stress Test,  Echocardiogram, Cardiac CT, MRI, or Chest Xray during the period you would be wearing the  monitor. The patch cannot be worn during these tests. You cannot remove and re-apply the  ZIO XT patch monitor.  Your ZIO patch monitor will be mailed 3 day USPS to your address on file. It may take 3-5 days  to receive your monitor after you have been enrolled.  Once you have received your monitor, please review the enclosed instructions. Your monitor  has already been registered assigning a specific monitor serial # to you.  Billing and Patient Assistance Program Information  We have supplied [employer] with any of your insurance information on file for billing purposes. [employer] offers a sliding scale Patient Assistance Program for patients that do not have  insurance, or whose insurance does not completely cover the cost of the ZIO monitor.  You must apply for the Patient Assistance Program to qualify for this discounted rate.  To apply, please call [employer] at [phone], select option 4, select option 2, ask to apply for  Patient Assistance Program. Meredeth will ask your household income, and how many people  are in your household. They will quote your out-of-pocket cost based on that information.  [employer] will also be able to set up a 29-month, interest-free payment plan if needed.  Applying the monitor   Shave hair from upper left chest.  Hold abrader disc by orange tab. Rub abrader in 40 strokes over the upper left chest as  indicated in your monitor instructions.  Clean area with 4 enclosed alcohol  pads. Let dry.  Apply patch as indicated in monitor instructions. Patch will be placed under collarbone on left  side of chest with arrow pointing upward.  Rub patch adhesive wings for 2 minutes. Remove white label marked 1. Remove the white  label marked 2. Rub patch adhesive wings for 2 additional minutes.  While looking in a mirror, press  and release button in center of patch. A small green light will  flash 3-4 times. This will be your only indicator that the monitor has been turned on.  Do not shower for the first 24 hours. You may shower after the first 24 hours.  Press the button if you feel a symptom. You will hear a small click. Record Date, Time and  Symptom in the Patient Logbook.  When you are ready to remove the patch, follow instructions on the last 2 pages of Patient  Logbook. Stick patch monitor onto the last page of Patient Logbook.  Place Patient Logbook in the blue and white box. Use locking tab on box and tape box closed  securely. The blue and white box has prepaid postage on it. Please place it in the mailbox as  soon as possible. Your physician should have your test results approximately 7 days after the  monitor has been mailed back to [facility].  Call [facility] Customer Care at [phone] if you have questions regarding  your ZIO XT patch monitor. Call them immediately if you see an orange light blinking on your  monitor.  If your monitor falls off in less than 4 days, contact our Monitor department at [phone].  If your monitor becomes loose or falls off after 4 days call [employer] at [phone] for  suggestions on securing your monitor   Follow-Up: At [facility], you and your health needs are our priority.  As part of our continuing mission to provide you with exceptional heart care, our providers are all part of one team.  This team includes your primary Cardiologist (physician) and Advanced Practice Providers or APPs (Physician Assistants and Nurse Practitioners) who all work together to provide you with the care you need, when you need it.  Your next appointment:   6 month(s)  Provider:   Lurena Red, MD   We recommend signing up for the patient portal called MyChart.  Sign up information is provided on this After Visit Summary.  MyChart is used to connect with  patients for Virtual Visits (Telemedicine).  Patients are able to view lab/test results, encounter notes, upcoming appointments, etc.  Non-urgent messages can be sent to your provider as well.   To learn more about what you can do with MyChart, go to [email or web address].   Other Instructions   Your cardiac CT will be scheduled at one of the below locations:   [facility] [address] [place], [place] [address] [phone]  OR   [facility] [address]  [place], [place] [address]  If scheduled at [facility], please arrive at the [facility] and Children's Entrance (Entrance C2) of [facility] 30 minutes prior to test start time. You can use the FREE valet parking offered at entrance C (encouraged to control the heart rate for the test)  Proceed to the [facility] Radiology Department (first floor) to check-in and test prep.  All radiology patients and guests should use entrance C2 at [facility], accessed from [facility], even though the hospital's physical address listed  is [address]  If scheduled at the Heart and Vascular Tower at [address], please enter the parking lot using the [address] entrance and use the FREE valet service at the patient drop-off area. Enter the building and check-in with registration on the main floor.  Please follow these instructions carefully (unless otherwise directed):  An IV will be required for this test and Nitroglycerin  will be given.  Hold all erectile dysfunction medications at least 3 days (72 hrs) prior to test. (Ie viagra, cialis, sildenafil, tadalafil, etc)   On the Night Before the Test: Be sure to Drink plenty of water . Do not consume any caffeinated/decaffeinated beverages or chocolate 12 hours prior to your test. Do not take any antihistamines 12 hours prior to your test. If the patient has contrast allergy: Patient will need a  prescription for Prednisone  and very clear instructions (as follows): Prednisone  50 mg - take 13 hours prior to test Take another Prednisone  50 mg 7 hours prior to test Take another Prednisone  50 mg 1 hour prior to test  On the Day of the Test: Drink plenty of water  until 1 hour prior to the test. Do not eat any food 1 hour prior to test. You may take your regular medications prior to the test.  Take metoprolol  (Lopressor ) two hours prior to test. If you take Furosemide /Hydrochlorothiazide /Spironolactone/Chlorthalidone, please HOLD on the morning of the test. Patients who wear a continuous glucose monitor MUST remove the device prior to scanning. FEMALES- please wear underwire-free bra if available, avoid dresses & tight clothing      After the Test: Drink plenty of water . After receiving IV contrast, you may experience a mild flushed feeling. This is normal. On occasion, you may experience a mild rash up to 24 hours after the test. This is not dangerous. If this occurs, you can take Benadryl  25 mg, Zyrtec , Claritin , or Allegra and increase your fluid intake. (Patients taking Tikosyn should avoid Benadryl , and may take Zyrtec , Claritin , or Allegra) If you experience trouble breathing, this can be serious. If it is severe call 911 IMMEDIATELY. If it is mild, please call our office.  We will call to schedule your test 2-4 weeks out understanding that some insurance companies will need an authorization prior to the service being performed.   For more information and frequently asked questions, please visit our website : [email or web address]  For non-scheduling related questions, please contact the cardiac imaging nurse navigator should you have any questions/concerns: Cardiac Imaging Nurse Navigators Direct Office Dial: [phone]   For scheduling needs, including cancellations and rescheduling, please call [place], [phone].

## 2024-07-04 ENCOUNTER — Other Ambulatory Visit: Payer: Self-pay

## 2024-07-07 ENCOUNTER — Other Ambulatory Visit: Payer: Self-pay | Admitting: *Deleted

## 2024-07-07 ENCOUNTER — Other Ambulatory Visit (HOSPITAL_COMMUNITY): Payer: Self-pay

## 2024-07-07 ENCOUNTER — Other Ambulatory Visit: Payer: Self-pay

## 2024-07-07 ENCOUNTER — Ambulatory Visit: Payer: Self-pay | Admitting: Internal Medicine

## 2024-07-07 ENCOUNTER — Telehealth: Payer: Self-pay | Admitting: Internal Medicine

## 2024-07-07 DIAGNOSIS — E785 Hyperlipidemia, unspecified: Secondary | ICD-10-CM

## 2024-07-07 MED ORDER — EZETIMIBE 10 MG PO TABS
10.0000 mg | ORAL_TABLET | Freq: Every day | ORAL | 3 refills | Status: AC
  Filled 2024-07-07: qty 90, 90d supply, fill #0
  Filled 2024-10-06: qty 90, 90d supply, fill #1
  Filled 2024-12-04: qty 90, 90d supply, fill #2

## 2024-07-07 NOTE — Telephone Encounter (Signed)
Returned call to patient left message on personal voice mail to call back. 

## 2024-07-07 NOTE — Telephone Encounter (Signed)
 Patient says box to return monitor does not include a return address. Please advise.

## 2024-07-10 LAB — COMPREHENSIVE METABOLIC PANEL WITH GFR
AG Ratio: 1.8 (calc) (ref 1.0–2.5)
ALT: 7 U/L (ref 6–29)
AST: 7 U/L — ABNORMAL LOW (ref 10–35)
Albumin: 4.1 g/dL (ref 3.6–5.1)
Alkaline phosphatase (APISO): 138 U/L (ref 37–153)
BUN/Creatinine Ratio: 14 (calc) (ref 6–22)
BUN: 22 mg/dL (ref 7–25)
CO2: 25 mmol/L (ref 20–32)
Calcium: 9.4 mg/dL (ref 8.6–10.4)
Chloride: 105 mmol/L (ref 98–110)
Creat: 1.57 mg/dL — ABNORMAL HIGH (ref 0.60–1.00)
Globulin: 2.3 g/dL (ref 1.9–3.7)
Glucose, Bld: 115 mg/dL — ABNORMAL HIGH (ref 65–99)
Potassium: 4 mmol/L (ref 3.5–5.3)
Sodium: 140 mmol/L (ref 135–146)
Total Bilirubin: 0.5 mg/dL (ref 0.2–1.2)
Total Protein: 6.4 g/dL (ref 6.1–8.1)
eGFR: 34 mL/min/1.73m2 — ABNORMAL LOW (ref >=60)

## 2024-07-10 LAB — LIPOPROTEIN A (LPA): Lipoprotein (a): 170 nmol/L — ABNORMAL HIGH (ref <75)

## 2024-07-10 LAB — LIPID PANEL
Cholesterol: 155 mg/dL (ref <200)
HDL: 38 mg/dL — ABNORMAL LOW (ref >=50)
LDL Cholesterol (Calc): 91 mg/dL
Non-HDL Cholesterol (Calc): 117 mg/dL (ref <130)
Total CHOL/HDL Ratio: 4.1 (calc) (ref <5.0)
Triglycerides: 154 mg/dL — ABNORMAL HIGH (ref <150)

## 2024-07-10 LAB — TSH(REFL): TSH: 2.06 m[IU]/L (ref 0.40–4.50)

## 2024-07-10 LAB — REFLEX TIQ

## 2024-07-11 ENCOUNTER — Ambulatory Visit (HOSPITAL_COMMUNITY)
Admission: RE | Admit: 2024-07-11 | Discharge: 2024-07-11 | Disposition: A | Discharge status: Home / Self Care | Source: Ambulatory Visit | Attending: Internal Medicine | Admitting: Internal Medicine

## 2024-07-11 ENCOUNTER — Other Ambulatory Visit (HOSPITAL_COMMUNITY): Payer: Self-pay | Admitting: Emergency Medicine

## 2024-07-11 DIAGNOSIS — I25118 Atherosclerotic heart disease of native coronary artery with other forms of angina pectoris: Secondary | ICD-10-CM | POA: Insufficient documentation

## 2024-07-11 DIAGNOSIS — I517 Cardiomegaly: Secondary | ICD-10-CM | POA: Insufficient documentation

## 2024-07-11 DIAGNOSIS — I700 Atherosclerosis of aorta: Secondary | ICD-10-CM | POA: Insufficient documentation

## 2024-07-11 DIAGNOSIS — R072 Precordial pain: Secondary | ICD-10-CM

## 2024-07-11 MED ORDER — IOHEXOL 350 MG/ML SOLN
100.0000 mL | Freq: Once | INTRAVENOUS | Status: AC | PRN
  Administered 2024-07-11 (×2): 100 mL via INTRAVENOUS

## 2024-07-11 MED ORDER — NITROGLYCERIN 0.4 MG SL SUBL
0.8000 mg | SUBLINGUAL_TABLET | Freq: Once | SUBLINGUAL | Status: AC
  Administered 2024-07-11 (×2): 0.8 mg via SUBLINGUAL

## 2024-07-12 ENCOUNTER — Other Ambulatory Visit: Payer: Self-pay | Admitting: Internal Medicine

## 2024-07-12 ENCOUNTER — Other Ambulatory Visit: Payer: Self-pay

## 2024-07-12 ENCOUNTER — Other Ambulatory Visit: Payer: Self-pay | Admitting: Family Medicine

## 2024-07-12 ENCOUNTER — Other Ambulatory Visit (HOSPITAL_COMMUNITY): Payer: Self-pay

## 2024-07-12 ENCOUNTER — Other Ambulatory Visit (HOSPITAL_BASED_OUTPATIENT_CLINIC_OR_DEPARTMENT_OTHER): Payer: Self-pay

## 2024-07-12 DIAGNOSIS — E039 Hypothyroidism, unspecified: Secondary | ICD-10-CM

## 2024-07-12 MED ORDER — LEVOTHYROXINE SODIUM 50 MCG PO TABS
50.0000 ug | ORAL_TABLET | Freq: Every day | ORAL | 1 refills | Status: AC
Start: 2024-07-12 — End: ?
  Filled 2024-07-12 – 2024-09-30 (×3): qty 90, 90d supply, fill #0

## 2024-07-13 ENCOUNTER — Other Ambulatory Visit (HOSPITAL_COMMUNITY): Payer: Self-pay

## 2024-07-13 NOTE — Telephone Encounter (Signed)
 Described [employer] box with prepaid return USPS label on it.  If her box does not have a prepaid USPS return label, please call [employer] at [phone] and request a new return box to be mailed to your home. You could also bring your ZIO patch monitor in a bag with your name and date of birth to our [employer] office and drop off at fifth floor, zone A.

## 2024-07-14 ENCOUNTER — Other Ambulatory Visit (HOSPITAL_COMMUNITY): Payer: Self-pay

## 2024-07-18 ENCOUNTER — Other Ambulatory Visit: Payer: Self-pay | Admitting: Internal Medicine

## 2024-07-18 ENCOUNTER — Other Ambulatory Visit (HOSPITAL_COMMUNITY): Payer: Self-pay

## 2024-07-19 ENCOUNTER — Telehealth: Payer: Self-pay | Admitting: Internal Medicine

## 2024-07-19 ENCOUNTER — Other Ambulatory Visit (HOSPITAL_COMMUNITY): Payer: Self-pay

## 2024-07-19 ENCOUNTER — Other Ambulatory Visit: Payer: Self-pay | Admitting: Family Medicine

## 2024-07-19 MED ORDER — POTASSIUM CHLORIDE CRYS ER 20 MEQ PO TBCR
20.0000 meq | EXTENDED_RELEASE_TABLET | Freq: Two times a day (BID) | ORAL | 3 refills | Status: AC
  Filled 2024-07-19: qty 180, 90d supply, fill #0
  Filled 2024-10-18 – 2024-12-08 (×2): qty 180, 90d supply, fill #1

## 2024-07-19 NOTE — Telephone Encounter (Signed)
 Patient c/o Palpitations: STAT if patient c/o lightheadedness, shortness of breath, or chest pain  How long have you had palpitations/irregular HR/ Afib? Are you having the symptoms now?  Palpitations started around 10:00 AM. Occurs mainly when she gets worked up.  Are you currently experiencing lightheadedness, SOB or CP?  Dizzy/lightheaded  Do you have a history of afib (atrial fibrillation) or irregular heart rhythm?  No   Have you checked your BP or HR? (document readings if available):  Hasn't checked today   Are you experiencing any other symptoms?  Nervous/anxious, SOB (has COPD) with exertion,

## 2024-07-19 NOTE — Telephone Encounter (Signed)
 Called patient back to let her know we do not have the results back at this time, but we will call her with the results. Patient verbalized understanding.

## 2024-07-19 NOTE — Telephone Encounter (Signed)
 Patient would like a call back to review monitor results.

## 2024-07-19 NOTE — Telephone Encounter (Signed)
 Received a call from patient she stated she is very anxious and nervous.She wore a heart monitor and has not received results.Stated she has appointment tomorrow with PCP for her nerves.Advised monitor results not available.She mailed monitor back 8/12.Advised it takes a couple of weeks.I will send message to Dr.Thukkani's RN to make her aware.

## 2024-07-20 ENCOUNTER — Other Ambulatory Visit: Payer: Self-pay

## 2024-07-20 ENCOUNTER — Ambulatory Visit (INDEPENDENT_AMBULATORY_CARE_PROVIDER_SITE_OTHER)

## 2024-07-20 ENCOUNTER — Other Ambulatory Visit (HOSPITAL_COMMUNITY): Payer: Self-pay

## 2024-07-20 VITALS — BP 102/58 | HR 66 | Wt 202.0 lb

## 2024-07-20 DIAGNOSIS — L989 Disorder of the skin and subcutaneous tissue, unspecified: Secondary | ICD-10-CM | POA: Diagnosis not present

## 2024-07-20 DIAGNOSIS — F419 Anxiety disorder, unspecified: Secondary | ICD-10-CM

## 2024-07-20 DIAGNOSIS — R21 Rash and other nonspecific skin eruption: Secondary | ICD-10-CM

## 2024-07-20 NOTE — Patient Instructions (Addendum)
 Thank you for bringing in your medications today for us  to review. This is very helpful for your care.   Today I am putting in a referral for virtual therapy for you. They should call you to schedule this.   I am also putting in a dermatology referral for the spot on your back.   Please follow-up in 1 month with Dr. Madelon.

## 2024-07-20 NOTE — Progress Notes (Signed)
    SUBJECTIVE:   CHIEF COMPLAINT / HPI:   Patient is here to talk about anxiety. She is accompanied by her daughter today. Her daughter encouraged her to bring in all of her medications. She states her anxiety is constant. She is anxious when she is left alone and when her daughter and granddaughter are home. She is worried about her heart health and worried about dying. She did not think she was on any medication for anxiety, however, after going through her bag of medicines she is on Lexapro . She feels like this does not help. She has tried therapy in the past. She is willing to try therapy again today. I recommended virtual therapy as patient has transportation issues. Her daughter felt that patient's granddaughter would be able to help her set up virtual therapy.   She also has a rash on her legs that is not new. She has ointment for the rash.   She also has a spot on her back that itches, that she requests we look at today.    PERTINENT  PMH / PSH: anxiety, skin rash   OBJECTIVE:   BP (!) 102/58   Pulse 66   Wt 202 lb (91.6 kg)   SpO2 97%   BMI 36.95 kg/m   General: anxious appearing female, NAD Cardiovascular: RRR, no m/r/g Respiratory: CTAB, normal work of breathing on room air  Abdomen: soft, non-tender to palpation, bowel sounds present  Skin: multiple flat small clustered erythematous circular lesions on bilateral lower shins, on right upper back raised flesh colored loculated growth with telangiectasias     ASSESSMENT/PLAN:   Assessment & Plan Anxiety Referral placed to [facility] for virtual therapy. Follow-up with Dr. Rumball in 1 month.  Skin lesion of back Referral to Dermatology. Concern for skin cancer. Picture above.  Rash Bilateral lower shins. Continue Halobetasol  ointment.      Raguel KANDICE Lee, DO [facility]

## 2024-07-21 ENCOUNTER — Other Ambulatory Visit (HOSPITAL_COMMUNITY): Payer: Self-pay

## 2024-07-24 ENCOUNTER — Ambulatory Visit: Payer: Self-pay | Admitting: Internal Medicine

## 2024-07-24 ENCOUNTER — Other Ambulatory Visit: Payer: Self-pay | Admitting: Family Medicine

## 2024-07-24 ENCOUNTER — Other Ambulatory Visit (HOSPITAL_COMMUNITY): Payer: Self-pay

## 2024-07-24 ENCOUNTER — Other Ambulatory Visit: Payer: Self-pay

## 2024-07-24 ENCOUNTER — Ambulatory Visit: Admitting: Family Medicine

## 2024-07-24 DIAGNOSIS — R072 Precordial pain: Secondary | ICD-10-CM

## 2024-07-24 DIAGNOSIS — I700 Atherosclerosis of aorta: Secondary | ICD-10-CM

## 2024-07-24 DIAGNOSIS — R002 Palpitations: Secondary | ICD-10-CM | POA: Diagnosis not present

## 2024-07-24 DIAGNOSIS — I5032 Chronic diastolic (congestive) heart failure: Secondary | ICD-10-CM | POA: Diagnosis not present

## 2024-07-25 ENCOUNTER — Other Ambulatory Visit (HOSPITAL_COMMUNITY): Payer: Self-pay

## 2024-07-25 ENCOUNTER — Other Ambulatory Visit: Payer: Self-pay

## 2024-07-25 MED ORDER — OMEPRAZOLE 40 MG PO CPDR
40.0000 mg | DELAYED_RELEASE_CAPSULE | Freq: Two times a day (BID) | ORAL | 3 refills | Status: AC
  Filled 2024-07-25 – 2024-09-01 (×4): qty 180, 90d supply, fill #0
  Filled 2024-12-04: qty 180, 90d supply, fill #1

## 2024-07-25 MED ORDER — NYSTATIN 100000 UNIT/GM EX POWD
CUTANEOUS | 3 refills | Status: AC
  Filled 2024-07-25: qty 60, 30d supply, fill #0
  Filled 2024-08-19: qty 60, 30d supply, fill #1

## 2024-07-26 NOTE — Telephone Encounter (Signed)
 Called patient today and reviewed the monitor results with her.  They were sent by MD on 07/24/24 to the portal but not reviewed by patient yet.  She voices understanding and is pleased to hear results.  Aware of echo on 08/08/24.

## 2024-07-27 ENCOUNTER — Other Ambulatory Visit: Payer: Self-pay

## 2024-07-27 ENCOUNTER — Other Ambulatory Visit (HOSPITAL_COMMUNITY): Payer: Self-pay

## 2024-08-08 ENCOUNTER — Other Ambulatory Visit (HOSPITAL_COMMUNITY): Payer: Self-pay

## 2024-08-08 ENCOUNTER — Other Ambulatory Visit: Payer: Self-pay

## 2024-08-08 ENCOUNTER — Ambulatory Visit (HOSPITAL_COMMUNITY)

## 2024-08-09 ENCOUNTER — Other Ambulatory Visit (HOSPITAL_COMMUNITY): Payer: Self-pay

## 2024-08-09 ENCOUNTER — Telehealth: Payer: Self-pay | Admitting: *Deleted

## 2024-08-09 NOTE — Progress Notes (Signed)
 Complex Care Management Note  Care Guide Note 08/09/2024 Name: Erica Morris MRN: [id number] DOB: 06-07-1946  Erica Morris is a 78 y.o. year old female who sees Rumball, Alison M, DO for primary care. I reached out to Erica Morris by phone today to offer complex care management services.  [name] was given information about Complex Care Management services today including:   The Complex Care Management services include support from the care team which includes your Nurse Care Manager, Clinical Social Worker, or Pharmacist.  The Complex Care Management team is here to help remove barriers to the health concerns and goals most important to you. Complex Care Management services are voluntary, and the patient may decline or stop services at any time by request to their care team member.   Complex Care Management Consent Status: Patient did not agree to participate in complex care management services at this time.  Follow up plan:  None  Encounter Outcome:  Patient Refused Patient has counseling set up and has a therapist that prescribes medication per [facility]  [facility] Guide  Direct Dial: [phone]  Fax [phone]

## 2024-08-15 ENCOUNTER — Other Ambulatory Visit (HOSPITAL_COMMUNITY): Payer: Self-pay

## 2024-08-15 MED ORDER — TRAZODONE HCL 100 MG PO TABS
100.0000 mg | ORAL_TABLET | Freq: Every day | ORAL | 0 refills | Status: DC
  Filled 2024-08-15: qty 30, 30d supply, fill #0

## 2024-08-15 MED ORDER — VRAYLAR 1.5 MG PO CAPS
1.5000 mg | ORAL_CAPSULE | Freq: Every day | ORAL | 0 refills | Status: AC
  Filled 2024-08-15: qty 30, 30d supply, fill #0

## 2024-08-15 MED ORDER — ESCITALOPRAM OXALATE 10 MG PO TABS
10.0000 mg | ORAL_TABLET | Freq: Every day | ORAL | 0 refills | Status: DC
  Filled 2024-08-15: qty 30, 30d supply, fill #0

## 2024-08-15 MED ORDER — OXCARBAZEPINE 150 MG PO TABS
150.0000 mg | ORAL_TABLET | Freq: Two times a day (BID) | ORAL | 0 refills | Status: DC
  Filled 2024-08-15: qty 60, 30d supply, fill #0

## 2024-08-15 MED ORDER — BENZTROPINE MESYLATE 0.5 MG PO TABS
0.5000 mg | ORAL_TABLET | Freq: Two times a day (BID) | ORAL | 0 refills | Status: DC
  Filled 2024-08-15: qty 60, 30d supply, fill #0

## 2024-08-16 ENCOUNTER — Other Ambulatory Visit (HOSPITAL_COMMUNITY): Payer: Self-pay

## 2024-08-17 ENCOUNTER — Ambulatory Visit (INDEPENDENT_AMBULATORY_CARE_PROVIDER_SITE_OTHER): Admitting: Family Medicine

## 2024-08-17 ENCOUNTER — Other Ambulatory Visit (HOSPITAL_COMMUNITY): Payer: Self-pay

## 2024-08-17 ENCOUNTER — Other Ambulatory Visit: Payer: Self-pay

## 2024-08-17 VITALS — BP 127/71 | HR 57 | Wt 200.2 lb

## 2024-08-17 DIAGNOSIS — L989 Disorder of the skin and subcutaneous tissue, unspecified: Secondary | ICD-10-CM | POA: Diagnosis not present

## 2024-08-17 DIAGNOSIS — C4491 Basal cell carcinoma of skin, unspecified: Secondary | ICD-10-CM | POA: Insufficient documentation

## 2024-08-17 NOTE — Patient Instructions (Addendum)
Sutured Wound Care Sutures are stitches that can be used to close wounds. Some stitches break down as they heal (absorbable). Other stitches need to be taken out by your doctor (nonabsorbable). Taking good care of your wound can help to prevent pain and infection. It can also help your wound heal more quickly. Follow instructions from your doctor about how to care for your sutured wound. Supplies needed: Soap and water. A clean, dry towel. Solution to clean your wound, if needed. A clean gauze or bandage (dressing), if needed. Antibiotic ointment, if told by your doctor. How to care for your sutured wound  Keep the wound fully dry for the first 24 hours or as long as told by your doctor. After 24-48 hours, you may shower or bathe as told by your doctor. Do not soak the wound or put the wound under water until the stitches have been taken out. After the first 24 hours, clean the wound once a day, or as often as your doctor tells you to. Take these steps: Wash and rinse the wound as told by your health care provider. Pat the wound dry with a clean towel. Do not rub the wound. After cleaning the wound, put a thin layer of antibiotic ointment on the wound as told by your doctor. This will help: Prevent infection. Keep the bandage from sticking to the wound. Follow instructions from your doctor about how to change your bandage. Make sure you: Wash your hands with soap and water for at least 20 seconds. If you cannot use soap and water, use hand sanitizer. Change your bandage at least once a day, or as often as told by your doctor. If your dressing gets wet or dirty, change it. Leavestitches in place for at least 2 weeks. If you have skin glue over your stitches, this should also stay in place for at least 2 weeks. Leave tape strips alone (if you have them) unless you are told to take them off. You may trim the edges of the tape strips if they curl up. Check your wound every day for signs of  infection. Watch for: Redness, swelling, or pain. Fluid or blood. New warmth, a rash, or hardness at the wound site. Pus or a bad smell. Have the stitches taken out as told by your doctor. Follow these instructions at home: Medicines Take or apply over-the-counter and prescription medicines only as told by your doctor. If you were prescribed an antibiotic medicine or ointment, take or apply it as told by your doctor. Do not stop using the antibiotic even if you start to feel better. General instructions Cover your wound with clothes or put sunscreen on when you are outside. Use a sunscreen of at least 30 SPF. Do not scratch or pick at your wound. Avoid stretching your wound. Raise the injured area above the level of your heart while you are sitting or lying down, if possible. Eat a diet that includes protein, vitamin A, and vitamin C. Doing this will help your wound heal. Drink enough fluid to keep your pee (urine) pale yellow. Keep all follow-up visits. Contact a doctor if: You were given a tetanus shot and you have any of the following at the site where the needle went in: Swelling. Very bad pain. Redness. Bleeding. Your wound breaks open. You see something coming out of your wound, such as wood or glass. You have any of these signs of infection in or around your wound: Redness, swelling, or pain. Fluid or blood.  Warmth. A new rash. Your wound feels hard. You have a fever. The skin near your wound changes color. You have pain that does not get better with medicine. You get numbness around the wound. Get help right away if: You have very bad swelling or more pain around your wound. You have pus or a bad smell coming from your wound. You have painful lumps near your wound or anywhere on your body. You have a red streak going away from your wound. The wound is on your hand or foot, and: Your fingers or toes look pale or blue. You cannot move a finger or toe as you used to  do. You have numbness that spreads down your hand, foot, fingers, or toes. Summary Sutures are stitches that are used to close wounds. Taking good care of your wound can help to prevent pain and infection. Keep the wound fully dry for the first 24 hours or for as long as told by your doctor. After 24-48 hours, you may shower or bathe as told by your doctor. This information is not intended to replace advice given to you by your health care provider. Make sure you discuss any questions you have with your health care provider. Document Revised: 03/24/2021 Document Reviewed: 03/24/2021 Elsevier Patient Education  2024 [employer].

## 2024-08-17 NOTE — Progress Notes (Addendum)
    SUBJECTIVE:   CHIEF COMPLAINT / HPI:   Erica Morris is a 78 y.o.female who presents to dermatology clinic for skin lesions. Patient reports lesion on the R upper back that has been present for over a month. Patient states it started as a blister and has since started scabbing over. Reports associated itching and burning sensation, especially with movement. Patient also reports a lesion on the L upper arm that has been present for around the same time (over a month). She states it is itchy and has become more erythematous since she first noticed it. Patient initially thought it was a bug bite but it has not gone away. Denies any edema at the site.   PERTINENT  PMH / PSH: anxiety  OBJECTIVE:   BP 127/71   Pulse (!) 57   Wt 200 lb 3.2 oz (90.8 kg)   SpO2 100%   BMI 36.62 kg/m    General: Alert, well-appearing female, in NAD Respiratory: Breathing comfortably on room air Extremities: Moves all four extremities appropriately Skin: R upper back: ulcerated lesion ~2-3cm that is erythematous - now scabbed compared to previous image in media L upper arm: papule, approx 0.2 - 0.5 cm present on L upper arm, erythematous Psych: Normal affect and mood  ASSESSMENT/PLAN:   Assessment & Plan R upper back skin lesion Appearance is concerning for SCC so will proceed with excision biopsy. Discussed procedure with patient and obtained consent. Care instructions provided in AVS - Completed excision biopsy today - Return in 2 weeks for suture removal  Skin Biopsy Procedure Note  PRE-OP DIAGNOSIS: Right upper back lesion POST-OP DIAGNOSIS: Same  PROCEDURE: skin excisional biopsy Performing Physician: Drs. Eniola and [employer] Physician (if applicable): Dr. Anders  PROCEDURE:  Excisional Biopsy: Both verbal and written informed consent obtained.         The area surrounding the skin lesion was prepared and draped in the  usual sterile manner.  The lesion was infiltrated with 5  cc of 1% Lidocaine  with epi. The lesion was removed in the usual manner by the  biopsy method noted above. Hemostasis was assured. The patient tolerated  the procedure well.  Closure:  suture : absorbable deep suturing completed followed by superficial suturing with 3-0 sutures. 8 stitches in place. Bacitracin applied and the incision was covered with 2x2 gauze and tape. Biopsy specimen sent to the pathology lab for evaluation. RN clinic appointment mad for suture removal in 2 weeks.  Followup: The patient tolerated the procedure well without  complications.  Standard post-procedure care is explained and return  precautions are given.           L upper arm skin lesion Lesion appears benign on exam. Believe it is appropriate to monitor for now. - Continue monitoring - Follow up in 2 - 4 weeks for reassessment or sooner if it grows bigger - She and her daughter agreed with the plan    Emrah Ariola, DO [facility]

## 2024-08-17 NOTE — Assessment & Plan Note (Addendum)
 Appearance is concerning for [facility] so will proceed with excision biopsy. Discussed procedure with patient and obtained consent. Care instructions provided in AVS - Completed excision biopsy today - Return in 2 weeks for suture removal  Skin Biopsy Procedure Note  PRE-OP DIAGNOSIS: Right upper back lesion POST-OP DIAGNOSIS: Same  PROCEDURE: skin excisional biopsy Performing Physician: Drs. Eniola and [employer] Physician (if applicable): Dr. Anders  PROCEDURE:  Excisional Biopsy: Both verbal and written informed consent obtained.         The area surrounding the skin lesion was prepared and draped in the  usual sterile manner.  The lesion was infiltrated with 5 cc of 1% Lidocaine  with epi. The lesion was removed in the usual manner by the  biopsy method noted above. Hemostasis was assured. The patient tolerated  the procedure well.  Closure:  suture : absorbable deep suturing completed followed by superficial suturing with 3-0 sutures. 8 stitches in place. Bacitracin applied and the incision was covered with 2x2 gauze and tape. Biopsy specimen sent to the pathology lab for evaluation. RN clinic appointment mad for suture removal in 2 weeks.  Followup: The patient tolerated the procedure well without  complications.  Standard post-procedure care is explained and return  precautions are given.

## 2024-08-17 NOTE — Assessment & Plan Note (Addendum)
 Lesion appears benign on exam. Believe it is appropriate to monitor for now. - Continue monitoring - Follow up in 2 - 4 weeks for reassessment or sooner if it grows bigger - She and her daughter agreed with the plan

## 2024-08-17 NOTE — Progress Notes (Deleted)
    SUBJECTIVE:   CHIEF COMPLAINT / HPI:   ***  PERTINENT  PMH / PSH: ***  OBJECTIVE:   There were no vitals taken for this visit.  ***  ASSESSMENT/PLAN:   Assessment & Plan      Erica Jernigan, DO [facility]

## 2024-08-18 ENCOUNTER — Other Ambulatory Visit: Payer: Self-pay

## 2024-08-18 ENCOUNTER — Encounter: Payer: Self-pay | Admitting: Pharmacist

## 2024-08-19 ENCOUNTER — Other Ambulatory Visit (HOSPITAL_COMMUNITY): Payer: Self-pay

## 2024-08-20 ENCOUNTER — Other Ambulatory Visit (HOSPITAL_COMMUNITY): Payer: Self-pay

## 2024-08-20 MED ORDER — BENZTROPINE MESYLATE 0.5 MG PO TABS
0.5000 mg | ORAL_TABLET | Freq: Two times a day (BID) | ORAL | 0 refills | Status: DC
  Filled 2024-09-11: qty 60, 30d supply, fill #0

## 2024-08-20 MED ORDER — OXCARBAZEPINE 150 MG PO TABS
150.0000 mg | ORAL_TABLET | Freq: Two times a day (BID) | ORAL | 0 refills | Status: DC
  Filled 2024-09-11: qty 60, 30d supply, fill #0

## 2024-08-20 MED ORDER — TRAZODONE HCL 100 MG PO TABS
100.0000 mg | ORAL_TABLET | Freq: Every day | ORAL | 0 refills | Status: DC
  Filled 2024-09-11: qty 30, 30d supply, fill #0

## 2024-08-20 MED ORDER — ESCITALOPRAM OXALATE 10 MG PO TABS
10.0000 mg | ORAL_TABLET | Freq: Every day | ORAL | 0 refills | Status: DC
  Filled 2024-09-11: qty 30, 30d supply, fill #0

## 2024-08-21 ENCOUNTER — Other Ambulatory Visit (HOSPITAL_COMMUNITY): Payer: Self-pay

## 2024-08-21 ENCOUNTER — Other Ambulatory Visit: Payer: Self-pay

## 2024-08-21 ENCOUNTER — Ambulatory Visit: Admitting: Family Medicine

## 2024-08-22 ENCOUNTER — Other Ambulatory Visit: Payer: Self-pay

## 2024-08-22 ENCOUNTER — Other Ambulatory Visit (HOSPITAL_COMMUNITY): Payer: Self-pay

## 2024-08-22 ENCOUNTER — Encounter: Payer: Self-pay | Admitting: Pharmacist

## 2024-08-23 ENCOUNTER — Ambulatory Visit: Payer: Self-pay | Admitting: Family Medicine

## 2024-08-23 LAB — DERMATOLOGY PATHOLOGY

## 2024-08-23 NOTE — Telephone Encounter (Signed)
 Name and DOB confirmed Report discussed with the patient. Biopsy shows BCC with clear margin per pathology report I advised her that she would need close skin monitoring for cancer She denies any concern about her surgery site which is healing well. I remind her of her suture removal appointment and she said she might need to call to reschedule F/U soon in derm clinic for other skin lesion. PCP to facilitate close monitoring and follow up plan.

## 2024-08-25 ENCOUNTER — Ambulatory Visit: Admitting: Family Medicine

## 2024-08-30 ENCOUNTER — Ambulatory Visit: Payer: Self-pay

## 2024-09-01 ENCOUNTER — Other Ambulatory Visit (HOSPITAL_COMMUNITY): Payer: Self-pay

## 2024-09-01 ENCOUNTER — Other Ambulatory Visit: Payer: Self-pay

## 2024-09-02 ENCOUNTER — Other Ambulatory Visit (HOSPITAL_COMMUNITY): Payer: Self-pay

## 2024-09-04 ENCOUNTER — Other Ambulatory Visit (HOSPITAL_COMMUNITY): Payer: Self-pay

## 2024-09-04 ENCOUNTER — Other Ambulatory Visit: Payer: Self-pay

## 2024-09-08 ENCOUNTER — Encounter: Payer: Self-pay | Admitting: Family Medicine

## 2024-09-08 ENCOUNTER — Other Ambulatory Visit (HOSPITAL_COMMUNITY): Payer: Self-pay

## 2024-09-08 ENCOUNTER — Ambulatory Visit (INDEPENDENT_AMBULATORY_CARE_PROVIDER_SITE_OTHER): Admitting: Family Medicine

## 2024-09-08 VITALS — BP 110/61 | HR 40 | Wt 201.4 lb

## 2024-09-08 DIAGNOSIS — J45909 Unspecified asthma, uncomplicated: Secondary | ICD-10-CM

## 2024-09-08 DIAGNOSIS — R001 Bradycardia, unspecified: Secondary | ICD-10-CM

## 2024-09-08 DIAGNOSIS — Z23 Encounter for immunization: Secondary | ICD-10-CM

## 2024-09-08 DIAGNOSIS — J439 Emphysema, unspecified: Secondary | ICD-10-CM | POA: Diagnosis not present

## 2024-09-08 DIAGNOSIS — I10 Essential (primary) hypertension: Secondary | ICD-10-CM

## 2024-09-08 DIAGNOSIS — R350 Frequency of micturition: Secondary | ICD-10-CM

## 2024-09-08 DIAGNOSIS — E039 Hypothyroidism, unspecified: Secondary | ICD-10-CM

## 2024-09-08 DIAGNOSIS — N1831 Chronic kidney disease, stage 3a: Secondary | ICD-10-CM

## 2024-09-08 DIAGNOSIS — C44519 Basal cell carcinoma of skin of other part of trunk: Secondary | ICD-10-CM

## 2024-09-08 MED ORDER — METOPROLOL SUCCINATE ER 25 MG PO TB24
12.5000 mg | ORAL_TABLET | Freq: Every day | ORAL | 0 refills | Status: AC
  Filled 2024-09-08 – 2024-11-21 (×2): qty 45, 90d supply, fill #0

## 2024-09-08 NOTE — Assessment & Plan Note (Addendum)
 Check BMP. UA ordered however unable to collect.

## 2024-09-08 NOTE — Assessment & Plan Note (Signed)
 Stitches removed today without difficulty. Tolerated procedure well.

## 2024-09-08 NOTE — Patient Instructions (Signed)
 It was great to see you!  Our plans for today:  - Take 1/2 pill of your metoprolol  (this is 12.5mg ). I recommend putting your pills in a pill box or asking for a pill pack from your pharmacy.  - We removed your stitches today.  - Come back in 2-4 weeks for recheck.   We are checking some labs today, we will release these results to your MyChart.  Take care and seek immediate care sooner if you develop any concerns.   Dr. Robb Sibal

## 2024-09-08 NOTE — Assessment & Plan Note (Signed)
 Low normal. On GDMT for CHF, reduce metoprolol  today. Consider d/c amlodipine  at follow up.

## 2024-09-08 NOTE — Assessment & Plan Note (Signed)
 Recheck TSH

## 2024-09-08 NOTE — Assessment & Plan Note (Signed)
 Persistent, reduce metoprolol  as previously instructed. F/u 2-4 weeks.

## 2024-09-08 NOTE — Assessment & Plan Note (Addendum)
 Recheck labs. Plan to add SGLT2 inhibitor given known CHF if BP can tolerate.

## 2024-09-08 NOTE — Progress Notes (Signed)
    SUBJECTIVE:   CHIEF COMPLAINT / HPI:   Discussed the use of AI scribe software for clinical note transcription with the patient, who gave verbal consent to proceed.  History of Present Illness Erica Morris is a 78 year old female who presents for medication management and follow-up on recent hospitalizations.  Hypertension, CHF: - Medications: metoprolol  - Compliance: taking full tablet daily (25mg ), supposed to take 1/2 tablet (12.5mg ) - issues with bradycardia in the past and previously instructed to take 1/2 tablet. - Daughter-in-law assists with medication management and is considering a pill pack for organization  BCC - History of basal cell carcinoma with a recent biopsy - Stitches at the biopsy site are causing itching and irritation, due to take out. - requires yearly survellience  Renal and urinary symptoms - Kidney function was slightly impaired at the last check - Slow urination without burning sensation - Increased urinary frequency - Has taken medication for urinary symptoms in the past    OBJECTIVE:   BP 110/61   Pulse (!) 40   Wt 201 lb 6.4 oz (91.4 kg)   SpO2 98%   BMI 36.84 kg/m   Gen: well appearing, in NAD Card: Reg rhythm, slow rate Lungs: CTAB Ext: WWP, no edema  ASSESSMENT/PLAN:   Hypothyroidism Recheck TSH  Essential hypertension Low normal. On GDMT for CHF, reduce metoprolol  today. Consider d/c amlodipine  at follow up.  Basal cell carcinoma Stitches removed today without difficulty. Tolerated procedure well.  Stage 3a chronic kidney disease (CKD) (HCC) Recheck labs. Plan to add SGLT2 inhibitor given known CHF if BP can tolerate.  Bradycardia Persistent, reduce metoprolol  as previously instructed. F/u 2-4 weeks.  Urinary frequency Check BMP. UA ordered however unable to collect.    HM - flu shot today. Recommended eye exam.  F/u 2-4 weeks.   Donald CHRISTELLA Lai, DO

## 2024-09-09 ENCOUNTER — Other Ambulatory Visit (HOSPITAL_COMMUNITY): Payer: Self-pay

## 2024-09-09 LAB — BASIC METABOLIC PANEL WITH GFR
BUN/Creatinine Ratio: 12 (ref 12–28)
BUN: 15 mg/dL (ref 8–27)
CO2: 20 mmol/L (ref 20–29)
Calcium: 8.9 mg/dL (ref 8.7–10.3)
Chloride: 104 mmol/L (ref 96–106)
Creatinine, Ser: 1.22 mg/dL — ABNORMAL HIGH (ref 0.57–1.00)
Glucose: 91 mg/dL (ref 70–99)
Potassium: 4.6 mmol/L (ref 3.5–5.2)
Sodium: 139 mmol/L (ref 134–144)
eGFR: 46 mL/min/1.73 — ABNORMAL LOW (ref >59)

## 2024-09-09 LAB — TSH: TSH: 2.26 u[IU]/mL (ref 0.450–4.500)

## 2024-09-11 ENCOUNTER — Other Ambulatory Visit (HOSPITAL_COMMUNITY): Payer: Self-pay

## 2024-09-11 ENCOUNTER — Other Ambulatory Visit: Payer: Self-pay

## 2024-09-11 ENCOUNTER — Ambulatory Visit (HOSPITAL_COMMUNITY)

## 2024-09-11 ENCOUNTER — Ambulatory Visit: Payer: Self-pay | Admitting: Family Medicine

## 2024-09-25 ENCOUNTER — Other Ambulatory Visit (HOSPITAL_COMMUNITY): Payer: Self-pay

## 2024-09-27 ENCOUNTER — Other Ambulatory Visit: Payer: Self-pay

## 2024-09-27 ENCOUNTER — Other Ambulatory Visit (HOSPITAL_COMMUNITY): Payer: Self-pay

## 2024-09-30 ENCOUNTER — Other Ambulatory Visit (HOSPITAL_COMMUNITY): Payer: Self-pay

## 2024-10-02 ENCOUNTER — Other Ambulatory Visit: Payer: Self-pay

## 2024-10-06 ENCOUNTER — Other Ambulatory Visit (HOSPITAL_COMMUNITY): Payer: Self-pay

## 2024-10-09 ENCOUNTER — Ambulatory Visit: Admitting: Family Medicine

## 2024-10-10 ENCOUNTER — Other Ambulatory Visit: Payer: Self-pay

## 2024-10-10 ENCOUNTER — Other Ambulatory Visit (HOSPITAL_COMMUNITY): Payer: Self-pay

## 2024-10-10 MED ORDER — TRAZODONE HCL 100 MG PO TABS
100.0000 mg | ORAL_TABLET | Freq: Every day | ORAL | 0 refills | Status: AC
  Filled 2024-10-10: qty 30, 30d supply, fill #0

## 2024-10-10 MED ORDER — BENZTROPINE MESYLATE 0.5 MG PO TABS
0.5000 mg | ORAL_TABLET | Freq: Two times a day (BID) | ORAL | 0 refills | Status: AC
  Filled 2024-10-10: qty 60, 30d supply, fill #0

## 2024-10-10 MED ORDER — OXCARBAZEPINE 150 MG PO TABS
150.0000 mg | ORAL_TABLET | Freq: Two times a day (BID) | ORAL | 0 refills | Status: AC
  Filled 2024-10-10: qty 60, 30d supply, fill #0

## 2024-10-10 MED ORDER — ESCITALOPRAM OXALATE 10 MG PO TABS
10.0000 mg | ORAL_TABLET | Freq: Every day | ORAL | 0 refills | Status: AC
  Filled 2024-10-10: qty 30, 30d supply, fill #0

## 2024-10-10 MED ORDER — HYDROXYZINE HCL 25 MG PO TABS
25.0000 mg | ORAL_TABLET | Freq: Two times a day (BID) | ORAL | 0 refills | Status: AC | PRN
  Filled 2024-10-10: qty 60, 30d supply, fill #0

## 2024-10-18 ENCOUNTER — Other Ambulatory Visit: Payer: Self-pay

## 2024-10-18 ENCOUNTER — Other Ambulatory Visit: Payer: Self-pay | Admitting: Family Medicine

## 2024-10-18 ENCOUNTER — Other Ambulatory Visit (HOSPITAL_COMMUNITY): Payer: Self-pay

## 2024-10-18 MED ORDER — ESCITALOPRAM OXALATE 20 MG PO TABS
20.0000 mg | ORAL_TABLET | Freq: Every day | ORAL | 0 refills | Status: AC
  Filled 2024-10-18: qty 60, 60d supply, fill #0

## 2024-10-20 ENCOUNTER — Other Ambulatory Visit (HOSPITAL_COMMUNITY): Payer: Self-pay

## 2024-10-23 ENCOUNTER — Ambulatory Visit (HOSPITAL_COMMUNITY)

## 2024-11-02 ENCOUNTER — Other Ambulatory Visit: Payer: Self-pay

## 2024-11-07 ENCOUNTER — Other Ambulatory Visit (HOSPITAL_COMMUNITY): Payer: Self-pay

## 2024-11-09 ENCOUNTER — Other Ambulatory Visit (HOSPITAL_COMMUNITY): Payer: Self-pay

## 2024-11-21 ENCOUNTER — Other Ambulatory Visit: Payer: Self-pay

## 2024-11-21 ENCOUNTER — Other Ambulatory Visit (HOSPITAL_COMMUNITY): Payer: Self-pay

## 2024-11-24 ENCOUNTER — Ambulatory Visit (HOSPITAL_COMMUNITY)

## 2024-11-28 ENCOUNTER — Other Ambulatory Visit: Payer: Self-pay

## 2024-11-28 ENCOUNTER — Other Ambulatory Visit (HOSPITAL_COMMUNITY): Payer: Self-pay

## 2024-12-03 NOTE — Progress Notes (Unsigned)
 "  SUBJECTIVE:   CHIEF COMPLAINT / HPI:   Discussed the use of AI scribe software for clinical note transcription with the patient, who gave verbal consent to proceed.  History of Present Illness Erica Morris is a 79 year old female who presents with shaking, a rash, and dry mouth.  Vesicular rash and pruritus - Blistered, itchy, and painful rash developed approximately one week ago - Rash located on right leg and buttocks - Symptoms worsen with sitting - Lotion and antibiotic ointment have not provided relief - Scratching induces burning sensation  Xerostomia and associated symptoms - Dry mouth present for about one month - Frequent fluid intake, primarily caffeinated beverages - Frequent urination with light yellow urine and occasional slow stream - No pain in mouth, salivary glands  Unintentional weight loss and appetite changes - Unintentional weight loss from over 200 lb to 184 lb since October - Low appetite, typically eating two meals per day  Medication adherence and access issues - Taking metoprolol  as a whole pill instead of prescribed half pill - Does not use a pill box for medication organization - gets medications delivered from pharmacy - needs refills on psychiatric medications  HTN - reports compliance with medications though taking whole pill of metoprolol  as above. Occasional dizziness with standing.  Also endorsing few months h/o intermittent tremor and shakiness both at rest and with intention. Interferes with holding objects and some dropping of objects. No falls. Has been drinking more caffeinated and sugary drinks due to dry mouth.    OBJECTIVE:   BP 119/67   Pulse (!) 59   Wt 184 lb 6.4 oz (83.6 kg)   SpO2 100%   BMI 33.73 kg/m   Gen: well appearing, in NAD HEENT: MMM, edentulous Card: Reg rate Lungs: Comfortable WOB on rA Ext: WWP, no edema Skin: dried vesicular rash with erythematous base with overlying excoriations. No evidence of  superimposed infection.   ASSESSMENT/PLAN:   Essential hypertension Low normal with orthostatic symptoms, d/c amlodipine  today. On GDMT for CHF, discussed taking 1/2 metoprolol  pill as prescribed, wrote on bottle today.  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HCC) Euvolemic on exam today. On appropriate GDMT. Continue to follow with Cardiology. Has ECHO upcoming.  COPD (chronic obstructive pulmonary disease) (HCC) Doing well on trelegy, continue. Gave rx for DME nebulizer and duonebs prn.  Hypothyroidism Recheck TSH in light of recent weight loss.  Stage 3a chronic kidney disease (CKD) (HCC) Recheck labs. Continue jardiance , ARB  Anemia Recheck labs.   Bradycardia Persistent though improved today, reduce metoprolol  as previously instructed. F/u 4 weeks.  Shakiness Also endorsing few months h/o intermittent tremor and shakiness both at rest and with intention. Interferes with holding objects and some dropping of objects. No falls. Has been drinking more caffeinated and sugary drinks due to dry mouth, possibly contributing. No obvious tremor on exam.  - reduce sugary and caffeineated drinks, opt for water  only - f/u 4 weeks, plan for full neuro exam at that time if persistent. - labs today.  Unintentional weight loss 17lb loss in 3 months, unintentional. No intentional restriction but does state if she doesn't want it, she doesn't eat it. Does not report loss of appetite.H/o anxiety/depression with longstanding social stressors, may be contributing, sees Psychiatry though not regularly and out of some medications on my review of her pills today. A1c 6.0. - CMP, CBC, TSH, LDH, HIV today - reduce caffeinated drinks as above, wonder if this is curbing her appetite - f/u  4 weeks, PHQ/GAD at that time. Instructed to call pharmacy for psych med refills.  Herpes zoster without complication Rash on right leg and buttocks for a week, itchy and painful. Antiviral treatment ineffective due to  late initiation. Short course of steroids considered. - Prescribed prednisone  for itching and pain management. - obtain shingles vaccine from pharmacy once healed.   Dry mouth Persisting for a month, possibly related to increased caffeine and sugar intake. Hydrated on exam, MMM. - Advised to drink water  instead of caffeinated and sugary beverages. - Ordered lab work to investigate underlying causes.  General health maintenance Discussion of medication management and refill needs. Need for pill packs to organize medications. - Instructed to call pharmacy to request pill packs. - Ensured medication refills are up to date. - DEXA ordered today.   F/u 4 weeks.  Donald CHRISTELLA Lai, DO "

## 2024-12-04 ENCOUNTER — Encounter: Payer: Self-pay | Admitting: Family Medicine

## 2024-12-04 ENCOUNTER — Other Ambulatory Visit: Payer: Self-pay

## 2024-12-04 ENCOUNTER — Other Ambulatory Visit (HOSPITAL_COMMUNITY): Payer: Self-pay

## 2024-12-04 ENCOUNTER — Ambulatory Visit: Admitting: Family Medicine

## 2024-12-04 VITALS — BP 119/67 | HR 59 | Wt 184.4 lb

## 2024-12-04 DIAGNOSIS — Z13820 Encounter for screening for osteoporosis: Secondary | ICD-10-CM

## 2024-12-04 DIAGNOSIS — R251 Tremor, unspecified: Secondary | ICD-10-CM | POA: Insufficient documentation

## 2024-12-04 DIAGNOSIS — R634 Abnormal weight loss: Secondary | ICD-10-CM | POA: Insufficient documentation

## 2024-12-04 DIAGNOSIS — J439 Emphysema, unspecified: Secondary | ICD-10-CM

## 2024-12-04 DIAGNOSIS — N1831 Chronic kidney disease, stage 3a: Secondary | ICD-10-CM | POA: Diagnosis not present

## 2024-12-04 DIAGNOSIS — R001 Bradycardia, unspecified: Secondary | ICD-10-CM | POA: Diagnosis not present

## 2024-12-04 DIAGNOSIS — R7303 Prediabetes: Secondary | ICD-10-CM | POA: Diagnosis not present

## 2024-12-04 DIAGNOSIS — B029 Zoster without complications: Secondary | ICD-10-CM | POA: Diagnosis not present

## 2024-12-04 DIAGNOSIS — E039 Hypothyroidism, unspecified: Secondary | ICD-10-CM | POA: Diagnosis not present

## 2024-12-04 DIAGNOSIS — I10 Essential (primary) hypertension: Secondary | ICD-10-CM | POA: Diagnosis not present

## 2024-12-04 DIAGNOSIS — I50810 Right heart failure, unspecified: Secondary | ICD-10-CM | POA: Diagnosis not present

## 2024-12-04 DIAGNOSIS — D649 Anemia, unspecified: Secondary | ICD-10-CM | POA: Diagnosis not present

## 2024-12-04 LAB — POCT GLYCOSYLATED HEMOGLOBIN (HGB A1C): HbA1c, POC (prediabetic range): 6 % (ref 5.7–6.4)

## 2024-12-04 MED ORDER — IPRATROPIUM-ALBUTEROL 0.5-2.5 (3) MG/3ML IN SOLN
3.0000 mL | Freq: Four times a day (QID) | RESPIRATORY_TRACT | 0 refills | Status: AC | PRN
  Filled 2024-12-04: qty 90, 8d supply, fill #0

## 2024-12-04 MED ORDER — PREDNISONE 20 MG PO TABS
40.0000 mg | ORAL_TABLET | Freq: Every day | ORAL | 0 refills | Status: AC
  Filled 2024-12-04: qty 10, 5d supply, fill #0

## 2024-12-04 NOTE — Assessment & Plan Note (Addendum)
 Also endorsing few months h/o intermittent tremor and shakiness both at rest and with intention. Interferes with holding objects and some dropping of objects. No falls. Has been drinking more caffeinated and sugary drinks due to dry mouth, possibly contributing. No obvious tremor on exam.  - reduce sugary and caffeineated drinks, opt for water  only - f/u 4 weeks, plan for full neuro exam at that time if persistent. - labs today.

## 2024-12-04 NOTE — Assessment & Plan Note (Addendum)
 Rash on right leg and buttocks for a week, itchy and painful. Antiviral treatment ineffective due to late initiation. Short course of steroids considered. - Prescribed prednisone  for itching and pain management. - obtain shingles vaccine from pharmacy once healed.

## 2024-12-04 NOTE — Assessment & Plan Note (Signed)
 Low normal with orthostatic symptoms, d/c amlodipine  today. On GDMT for CHF, discussed taking 1/2 metoprolol  pill as prescribed, wrote on bottle today.

## 2024-12-04 NOTE — Assessment & Plan Note (Signed)
 Euvolemic on exam today. On appropriate GDMT. Continue to follow with Cardiology. Has ECHO upcoming.

## 2024-12-04 NOTE — Assessment & Plan Note (Signed)
 Recheck labs

## 2024-12-04 NOTE — Assessment & Plan Note (Signed)
 Recheck TSH in light of recent weight loss.

## 2024-12-04 NOTE — Assessment & Plan Note (Signed)
 Doing well on trelegy, continue. Gave rx for DME nebulizer and duonebs prn.

## 2024-12-04 NOTE — Assessment & Plan Note (Signed)
 Recheck labs. Continue jardiance , ARB

## 2024-12-04 NOTE — Assessment & Plan Note (Signed)
 Persistent though improved today, reduce metoprolol  as previously instructed. F/u 4 weeks.

## 2024-12-04 NOTE — Patient Instructions (Addendum)
 It was great to see you!  Our plans for today:  - Drink only water  as much as you can help it.  - Call the pharmacy for refills on your psychiatry medications. Ask for a pill pack.  - Stop taking amlodipine  (we took this for you today). - Take the prednisone  for your shingles. Once you are healed, get your shingles vaccine.  - Come back in 1 month.  We are checking some labs today, we will release these results to your MyChart.  Take care and seek immediate care sooner if you develop any concerns.   Dr. Jonluke Cobbins

## 2024-12-04 NOTE — Assessment & Plan Note (Signed)
 17lb loss in 3 months, unintentional. No intentional restriction but does state if she doesn't want it, she doesn't eat it. Does not report loss of appetite.H/o anxiety/depression with longstanding social stressors, may be contributing, sees Psychiatry though not regularly and out of some medications on my review of her pills today. A1c 6.0. - CMP, CBC, TSH, LDH, HIV today - reduce caffeinated drinks as above, wonder if this is curbing her appetite - f/u 4 weeks, PHQ/GAD at that time. Instructed to call pharmacy for psych med refills.

## 2024-12-05 ENCOUNTER — Other Ambulatory Visit (HOSPITAL_COMMUNITY): Payer: Self-pay

## 2024-12-05 ENCOUNTER — Other Ambulatory Visit: Payer: Self-pay

## 2024-12-05 LAB — COMPREHENSIVE METABOLIC PANEL WITH GFR
ALT: 14 IU/L (ref 0–32)
AST: 15 IU/L (ref 0–40)
Albumin: 4.1 g/dL (ref 3.8–4.8)
Alkaline Phosphatase: 136 IU/L — ABNORMAL HIGH (ref 49–135)
BUN/Creatinine Ratio: 10 — ABNORMAL LOW (ref 12–28)
BUN: 15 mg/dL (ref 8–27)
Bilirubin Total: 0.5 mg/dL (ref 0.0–1.2)
CO2: 21 mmol/L (ref 20–29)
Calcium: 9.5 mg/dL (ref 8.7–10.3)
Chloride: 104 mmol/L (ref 96–106)
Creatinine, Ser: 1.46 mg/dL — ABNORMAL HIGH (ref 0.57–1.00)
Globulin, Total: 2.1 g/dL (ref 1.5–4.5)
Glucose: 127 mg/dL — ABNORMAL HIGH (ref 70–99)
Potassium: 3.5 mmol/L (ref 3.5–5.2)
Sodium: 141 mmol/L (ref 134–144)
Total Protein: 6.2 g/dL (ref 6.0–8.5)
eGFR: 37 mL/min/1.73 — ABNORMAL LOW

## 2024-12-05 LAB — LACTATE DEHYDROGENASE: LDH: 156 IU/L (ref 119–226)

## 2024-12-05 LAB — CBC
Hematocrit: 41.3 % (ref 34.0–46.6)
Hemoglobin: 12.6 g/dL (ref 11.1–15.9)
MCH: 26.4 pg — ABNORMAL LOW (ref 26.6–33.0)
MCHC: 30.5 g/dL — ABNORMAL LOW (ref 31.5–35.7)
MCV: 86 fL (ref 79–97)
Platelets: 223 x10E3/uL (ref 150–450)
RBC: 4.78 x10E6/uL (ref 3.77–5.28)
RDW: 12.7 % (ref 11.7–15.4)
WBC: 7.1 x10E3/uL (ref 3.4–10.8)

## 2024-12-05 LAB — HIV ANTIBODY (ROUTINE TESTING W REFLEX): HIV Screen 4th Generation wRfx: NONREACTIVE

## 2024-12-05 LAB — TSH: TSH: 2.64 u[IU]/mL (ref 0.450–4.500)

## 2024-12-05 LAB — SEDIMENTATION RATE: Sed Rate: 4 mm/h (ref 0–40)

## 2024-12-05 LAB — FERRITIN: Ferritin: 55 ng/mL (ref 15–150)

## 2024-12-06 ENCOUNTER — Other Ambulatory Visit (HOSPITAL_COMMUNITY): Payer: Self-pay

## 2024-12-06 ENCOUNTER — Ambulatory Visit: Payer: Self-pay | Admitting: Family Medicine

## 2024-12-07 ENCOUNTER — Other Ambulatory Visit (HOSPITAL_COMMUNITY): Payer: Self-pay

## 2024-12-08 ENCOUNTER — Other Ambulatory Visit (HOSPITAL_COMMUNITY): Payer: Self-pay

## 2024-12-13 ENCOUNTER — Other Ambulatory Visit (HOSPITAL_COMMUNITY): Payer: Self-pay

## 2024-12-15 ENCOUNTER — Other Ambulatory Visit: Payer: Self-pay

## 2024-12-16 ENCOUNTER — Other Ambulatory Visit (HOSPITAL_COMMUNITY): Payer: Self-pay

## 2024-12-18 ENCOUNTER — Other Ambulatory Visit: Payer: Self-pay

## 2024-12-18 ENCOUNTER — Other Ambulatory Visit (HOSPITAL_COMMUNITY): Payer: Self-pay

## 2024-12-24 ENCOUNTER — Other Ambulatory Visit (HOSPITAL_COMMUNITY): Payer: Self-pay

## 2024-12-25 ENCOUNTER — Other Ambulatory Visit: Payer: Self-pay

## 2024-12-27 ENCOUNTER — Other Ambulatory Visit (HOSPITAL_COMMUNITY): Payer: Self-pay

## 2024-12-27 ENCOUNTER — Other Ambulatory Visit: Payer: Self-pay

## 2024-12-28 ENCOUNTER — Ambulatory Visit (HOSPITAL_COMMUNITY)
Admission: RE | Admit: 2024-12-28 | Discharge: 2024-12-28 | Disposition: A | Discharge status: Home / Self Care | Source: Ambulatory Visit | Attending: Cardiology | Admitting: Cardiology

## 2024-12-28 DIAGNOSIS — I700 Atherosclerosis of aorta: Secondary | ICD-10-CM | POA: Diagnosis present

## 2024-12-28 DIAGNOSIS — R002 Palpitations: Secondary | ICD-10-CM | POA: Insufficient documentation

## 2024-12-28 DIAGNOSIS — I5032 Chronic diastolic (congestive) heart failure: Secondary | ICD-10-CM | POA: Diagnosis present

## 2024-12-28 DIAGNOSIS — R072 Precordial pain: Secondary | ICD-10-CM | POA: Insufficient documentation

## 2024-12-28 DIAGNOSIS — I10 Essential (primary) hypertension: Secondary | ICD-10-CM | POA: Insufficient documentation

## 2024-12-28 DIAGNOSIS — N1831 Chronic kidney disease, stage 3a: Secondary | ICD-10-CM | POA: Diagnosis present

## 2024-12-28 DIAGNOSIS — E785 Hyperlipidemia, unspecified: Secondary | ICD-10-CM | POA: Insufficient documentation

## 2024-12-28 LAB — ECHOCARDIOGRAM COMPLETE
P 1/2 time: 222 ms
S' Lateral: 3.46 cm

## 2025-01-01 ENCOUNTER — Other Ambulatory Visit: Payer: Self-pay

## 2025-01-02 ENCOUNTER — Other Ambulatory Visit: Payer: Self-pay

## 2025-01-02 ENCOUNTER — Other Ambulatory Visit (HOSPITAL_COMMUNITY): Payer: Self-pay

## 2025-03-01 ENCOUNTER — Ambulatory Visit: Admitting: Dermatology

## 2025-03-20 ENCOUNTER — Other Ambulatory Visit (HOSPITAL_BASED_OUTPATIENT_CLINIC_OR_DEPARTMENT_OTHER)

## 2025-05-28 ENCOUNTER — Encounter
